# Patient Record
Sex: Male | Born: 1940
Health system: Southern US, Community
[De-identification: ages and names within clinical notes are randomized; demographics above are authoritative.]

## PROBLEM LIST (undated history)

## (undated) DIAGNOSIS — Z8719 Personal history of other diseases of the digestive system: Secondary | ICD-10-CM

## (undated) DIAGNOSIS — I6350 Cerebral infarction due to unspecified occlusion or stenosis of unspecified cerebral artery: Secondary | ICD-10-CM

## (undated) DIAGNOSIS — I2510 Atherosclerotic heart disease of native coronary artery without angina pectoris: Secondary | ICD-10-CM

## (undated) DIAGNOSIS — G8929 Other chronic pain: Secondary | ICD-10-CM

## (undated) DIAGNOSIS — I350 Nonrheumatic aortic (valve) stenosis: Principal | ICD-10-CM

## (undated) DIAGNOSIS — T8859XA Other complications of anesthesia, initial encounter: Secondary | ICD-10-CM

## (undated) DIAGNOSIS — F419 Anxiety disorder, unspecified: Secondary | ICD-10-CM

## (undated) DIAGNOSIS — N059 Unspecified nephritic syndrome with unspecified morphologic changes: Secondary | ICD-10-CM

## (undated) DIAGNOSIS — M549 Dorsalgia, unspecified: Secondary | ICD-10-CM

## (undated) DIAGNOSIS — C801 Malignant (primary) neoplasm, unspecified: Secondary | ICD-10-CM

## (undated) DIAGNOSIS — E785 Hyperlipidemia, unspecified: Secondary | ICD-10-CM

## (undated) DIAGNOSIS — E538 Deficiency of other specified B group vitamins: Secondary | ICD-10-CM

## (undated) DIAGNOSIS — R011 Cardiac murmur, unspecified: Secondary | ICD-10-CM

## (undated) DIAGNOSIS — M1990 Unspecified osteoarthritis, unspecified site: Secondary | ICD-10-CM

## (undated) DIAGNOSIS — Z889 Allergy status to unspecified drugs, medicaments and biological substances status: Secondary | ICD-10-CM

## (undated) DIAGNOSIS — I639 Cerebral infarction, unspecified: Secondary | ICD-10-CM

## (undated) DIAGNOSIS — I10 Essential (primary) hypertension: Secondary | ICD-10-CM

## (undated) DIAGNOSIS — I6529 Occlusion and stenosis of unspecified carotid artery: Secondary | ICD-10-CM

## (undated) DIAGNOSIS — K219 Gastro-esophageal reflux disease without esophagitis: Secondary | ICD-10-CM

## (undated) DIAGNOSIS — G4730 Sleep apnea, unspecified: Secondary | ICD-10-CM

## (undated) DIAGNOSIS — T4145XA Adverse effect of unspecified anesthetic, initial encounter: Secondary | ICD-10-CM

## (undated) HISTORY — DX: Hyperlipidemia, unspecified: E78.5

## (undated) HISTORY — PX: INGUINAL HERNIA REPAIR: SUR1180

## (undated) HISTORY — PX: PERCUTANEOUS PLACEMENT INTRAVASCULAR STENT CERVICAL CAROTID ARTERY: SUR1019

## (undated) HISTORY — DX: Unspecified nephritic syndrome with unspecified morphologic changes: N05.9

## (undated) HISTORY — DX: Gastro-esophageal reflux disease without esophagitis: K21.9

## (undated) HISTORY — DX: Nonrheumatic aortic (valve) stenosis: I35.0

## (undated) HISTORY — PX: UPPER GASTROINTESTINAL ENDOSCOPY: SHX188

## (undated) HISTORY — PX: HERNIA REPAIR: SHX51

## (undated) HISTORY — PX: COLONOSCOPY: SHX174

## (undated) HISTORY — DX: Atherosclerotic heart disease of native coronary artery without angina pectoris: I25.10

## (undated) HISTORY — DX: Sleep apnea, unspecified: G47.30

## (undated) HISTORY — DX: Deficiency of other specified B group vitamins: E53.8

## (undated) HISTORY — DX: Occlusion and stenosis of unspecified carotid artery: I65.29

## (undated) HISTORY — DX: Essential (primary) hypertension: I10

## (undated) HISTORY — DX: Cerebral infarction due to unspecified occlusion or stenosis of unspecified cerebral artery: I63.50

## (undated) HISTORY — DX: Malignant (primary) neoplasm, unspecified: C80.1

## (undated) HISTORY — DX: Cardiac murmur, unspecified: R01.1

---

## 1992-03-02 DIAGNOSIS — Z8711 Personal history of peptic ulcer disease: Secondary | ICD-10-CM

## 1992-03-02 HISTORY — DX: Personal history of peptic ulcer disease: Z87.11

## 1997-10-10 ENCOUNTER — Inpatient Hospital Stay (HOSPITAL_COMMUNITY): Admission: EM | Admit: 1997-10-10 | Discharge: 1997-10-11 | Payer: Self-pay | Admitting: Emergency Medicine

## 1997-12-07 ENCOUNTER — Ambulatory Visit (HOSPITAL_COMMUNITY): Admission: RE | Admit: 1997-12-07 | Discharge: 1997-12-07 | Payer: Self-pay | Admitting: Internal Medicine

## 1998-02-25 ENCOUNTER — Observation Stay (HOSPITAL_COMMUNITY): Admission: EM | Admit: 1998-02-25 | Discharge: 1998-02-26 | Payer: Self-pay | Admitting: Emergency Medicine

## 1998-02-25 ENCOUNTER — Encounter: Payer: Self-pay | Admitting: Emergency Medicine

## 1998-05-09 ENCOUNTER — Inpatient Hospital Stay (HOSPITAL_COMMUNITY): Admission: EM | Admit: 1998-05-09 | Discharge: 1998-05-12 | Payer: Self-pay | Admitting: Emergency Medicine

## 1998-06-27 ENCOUNTER — Emergency Department (HOSPITAL_COMMUNITY): Admission: EM | Admit: 1998-06-27 | Discharge: 1998-06-27 | Payer: Self-pay | Admitting: Emergency Medicine

## 1998-06-27 ENCOUNTER — Encounter: Payer: Self-pay | Admitting: Emergency Medicine

## 1998-07-30 ENCOUNTER — Inpatient Hospital Stay (HOSPITAL_COMMUNITY): Admission: EM | Admit: 1998-07-30 | Discharge: 1998-07-31 | Payer: Self-pay | Admitting: Emergency Medicine

## 1999-10-24 ENCOUNTER — Encounter: Payer: Self-pay | Admitting: General Surgery

## 1999-10-28 ENCOUNTER — Ambulatory Visit (HOSPITAL_COMMUNITY): Admission: RE | Admit: 1999-10-28 | Discharge: 1999-10-28 | Payer: Self-pay | Admitting: General Surgery

## 2001-04-08 ENCOUNTER — Encounter: Payer: Self-pay | Admitting: Internal Medicine

## 2001-04-08 ENCOUNTER — Encounter: Admission: RE | Admit: 2001-04-08 | Discharge: 2001-04-08 | Payer: Self-pay | Admitting: Internal Medicine

## 2001-04-10 ENCOUNTER — Encounter: Admission: RE | Admit: 2001-04-10 | Discharge: 2001-04-10 | Payer: Self-pay | Admitting: Internal Medicine

## 2001-04-10 ENCOUNTER — Encounter: Payer: Self-pay | Admitting: Internal Medicine

## 2002-08-08 ENCOUNTER — Encounter: Payer: Self-pay | Admitting: Internal Medicine

## 2002-08-08 ENCOUNTER — Ambulatory Visit (HOSPITAL_COMMUNITY): Admission: RE | Admit: 2002-08-08 | Discharge: 2002-08-08 | Payer: Self-pay | Admitting: Internal Medicine

## 2002-08-15 ENCOUNTER — Ambulatory Visit (HOSPITAL_COMMUNITY): Admission: RE | Admit: 2002-08-15 | Discharge: 2002-08-15 | Payer: Self-pay | Admitting: Internal Medicine

## 2002-09-12 ENCOUNTER — Ambulatory Visit (HOSPITAL_COMMUNITY): Admission: RE | Admit: 2002-09-12 | Discharge: 2002-09-12 | Payer: Self-pay | Admitting: Interventional Radiology

## 2003-01-11 ENCOUNTER — Ambulatory Visit (HOSPITAL_COMMUNITY): Admission: RE | Admit: 2003-01-11 | Discharge: 2003-01-11 | Payer: Self-pay | Admitting: Interventional Radiology

## 2003-01-16 ENCOUNTER — Ambulatory Visit (HOSPITAL_COMMUNITY): Admission: RE | Admit: 2003-01-16 | Discharge: 2003-01-16 | Payer: Self-pay | Admitting: Interventional Radiology

## 2004-04-30 ENCOUNTER — Ambulatory Visit: Payer: Self-pay | Admitting: Internal Medicine

## 2004-05-12 ENCOUNTER — Ambulatory Visit: Payer: Self-pay | Admitting: Internal Medicine

## 2004-05-12 ENCOUNTER — Observation Stay (HOSPITAL_COMMUNITY): Admission: EM | Admit: 2004-05-12 | Discharge: 2004-05-13 | Payer: Self-pay | Admitting: *Deleted

## 2004-05-14 ENCOUNTER — Ambulatory Visit: Payer: Self-pay

## 2004-05-14 ENCOUNTER — Ambulatory Visit: Payer: Self-pay | Admitting: Cardiology

## 2004-05-20 ENCOUNTER — Ambulatory Visit: Payer: Self-pay | Admitting: Internal Medicine

## 2004-06-23 ENCOUNTER — Ambulatory Visit: Payer: Self-pay | Admitting: Internal Medicine

## 2004-06-24 ENCOUNTER — Ambulatory Visit: Payer: Self-pay | Admitting: Internal Medicine

## 2004-08-25 ENCOUNTER — Ambulatory Visit: Payer: Self-pay | Admitting: Internal Medicine

## 2004-08-29 ENCOUNTER — Ambulatory Visit: Payer: Self-pay | Admitting: Internal Medicine

## 2004-09-22 ENCOUNTER — Ambulatory Visit: Payer: Self-pay | Admitting: Internal Medicine

## 2004-11-24 ENCOUNTER — Ambulatory Visit: Payer: Self-pay | Admitting: Internal Medicine

## 2004-12-01 ENCOUNTER — Ambulatory Visit: Payer: Self-pay | Admitting: Internal Medicine

## 2005-02-11 ENCOUNTER — Ambulatory Visit: Payer: Self-pay | Admitting: Internal Medicine

## 2005-03-02 HISTORY — PX: CARDIAC CATHETERIZATION: SHX172

## 2005-03-25 ENCOUNTER — Inpatient Hospital Stay (HOSPITAL_COMMUNITY): Admission: EM | Admit: 2005-03-25 | Discharge: 2005-03-26 | Payer: Self-pay | Admitting: Emergency Medicine

## 2005-03-25 ENCOUNTER — Ambulatory Visit: Payer: Self-pay | Admitting: Cardiovascular Disease

## 2005-04-07 ENCOUNTER — Encounter: Payer: Self-pay | Admitting: Cardiology

## 2005-04-07 ENCOUNTER — Ambulatory Visit: Payer: Self-pay

## 2005-04-20 ENCOUNTER — Ambulatory Visit: Payer: Self-pay | Admitting: Cardiology

## 2005-05-04 ENCOUNTER — Ambulatory Visit: Payer: Self-pay | Admitting: Cardiology

## 2005-06-02 ENCOUNTER — Ambulatory Visit: Payer: Self-pay | Admitting: Internal Medicine

## 2005-06-16 ENCOUNTER — Ambulatory Visit: Payer: Self-pay | Admitting: Internal Medicine

## 2005-08-24 ENCOUNTER — Ambulatory Visit: Payer: Self-pay | Admitting: Internal Medicine

## 2005-09-07 ENCOUNTER — Ambulatory Visit: Payer: Self-pay | Admitting: Internal Medicine

## 2005-09-07 LAB — HM COLONOSCOPY

## 2005-10-24 ENCOUNTER — Ambulatory Visit: Payer: Self-pay | Admitting: Family Medicine

## 2005-10-29 ENCOUNTER — Ambulatory Visit: Payer: Self-pay | Admitting: Internal Medicine

## 2005-11-27 ENCOUNTER — Ambulatory Visit: Payer: Self-pay | Admitting: Internal Medicine

## 2005-12-04 ENCOUNTER — Ambulatory Visit: Payer: Self-pay | Admitting: Internal Medicine

## 2005-12-11 ENCOUNTER — Ambulatory Visit: Payer: Self-pay | Admitting: Internal Medicine

## 2005-12-16 ENCOUNTER — Ambulatory Visit: Payer: Self-pay | Admitting: Internal Medicine

## 2006-01-16 ENCOUNTER — Ambulatory Visit: Payer: Self-pay | Admitting: Internal Medicine

## 2006-01-18 ENCOUNTER — Ambulatory Visit: Payer: Self-pay | Admitting: Internal Medicine

## 2006-02-10 ENCOUNTER — Ambulatory Visit: Payer: Self-pay | Admitting: Internal Medicine

## 2006-03-17 ENCOUNTER — Ambulatory Visit: Payer: Self-pay | Admitting: Internal Medicine

## 2006-03-19 ENCOUNTER — Ambulatory Visit: Payer: Self-pay | Admitting: Internal Medicine

## 2006-03-19 LAB — CONVERTED CEMR LAB
ALT: 37 units/L (ref 0–40)
AST: 25 units/L (ref 0–37)
Albumin: 4.1 g/dL (ref 3.5–5.2)
Alkaline Phosphatase: 84 units/L (ref 39–117)
Basophils Absolute: 0 10*3/uL (ref 0.0–0.1)
GFR calc Af Amer: 97 mL/min
Ketones, ur: NEGATIVE mg/dL
MCHC: 33.4 g/dL (ref 30.0–36.0)
MCV: 87.8 fL (ref 78.0–100.0)
Monocytes Relative: 10.8 % (ref 3.0–11.0)
Nitrite: NEGATIVE
Platelets: 260 10*3/uL (ref 150–400)
Potassium: 4.8 meq/L (ref 3.5–5.1)
RBC: 4.67 M/uL (ref 4.22–5.81)
Sodium: 141 meq/L (ref 135–145)
Total Bilirubin: 0.7 mg/dL (ref 0.3–1.2)
Urine Glucose: NEGATIVE mg/dL
WBC: 6.8 10*3/uL (ref 4.5–10.5)

## 2006-03-22 ENCOUNTER — Ambulatory Visit: Payer: Self-pay | Admitting: Cardiology

## 2006-03-22 ENCOUNTER — Ambulatory Visit: Payer: Self-pay

## 2006-04-01 ENCOUNTER — Ambulatory Visit: Payer: Self-pay | Admitting: Internal Medicine

## 2006-04-19 ENCOUNTER — Encounter: Admission: RE | Admit: 2006-04-19 | Discharge: 2006-04-19 | Payer: Self-pay | Admitting: Internal Medicine

## 2006-06-30 ENCOUNTER — Ambulatory Visit: Payer: Self-pay | Admitting: Internal Medicine

## 2006-07-03 ENCOUNTER — Encounter: Admission: RE | Admit: 2006-07-03 | Discharge: 2006-07-03 | Payer: Self-pay | Admitting: Neurology

## 2006-07-20 ENCOUNTER — Ambulatory Visit: Payer: Self-pay | Admitting: Internal Medicine

## 2006-08-17 ENCOUNTER — Encounter: Payer: Self-pay | Admitting: Internal Medicine

## 2006-08-17 ENCOUNTER — Inpatient Hospital Stay (HOSPITAL_COMMUNITY): Admission: EM | Admit: 2006-08-17 | Discharge: 2006-08-19 | Payer: Self-pay | Admitting: Emergency Medicine

## 2006-08-17 ENCOUNTER — Ambulatory Visit: Payer: Self-pay | Admitting: Internal Medicine

## 2006-08-17 ENCOUNTER — Ambulatory Visit: Payer: Self-pay | Admitting: Vascular Surgery

## 2006-09-01 ENCOUNTER — Ambulatory Visit: Payer: Self-pay | Admitting: Internal Medicine

## 2006-09-06 ENCOUNTER — Ambulatory Visit: Payer: Self-pay | Admitting: Internal Medicine

## 2006-12-11 ENCOUNTER — Encounter: Payer: Self-pay | Admitting: Internal Medicine

## 2006-12-11 DIAGNOSIS — N059 Unspecified nephritic syndrome with unspecified morphologic changes: Secondary | ICD-10-CM | POA: Insufficient documentation

## 2006-12-11 DIAGNOSIS — I6350 Cerebral infarction due to unspecified occlusion or stenosis of unspecified cerebral artery: Secondary | ICD-10-CM | POA: Insufficient documentation

## 2006-12-11 DIAGNOSIS — Z8719 Personal history of other diseases of the digestive system: Secondary | ICD-10-CM

## 2006-12-11 DIAGNOSIS — M722 Plantar fascial fibromatosis: Secondary | ICD-10-CM

## 2006-12-11 DIAGNOSIS — Z9189 Other specified personal risk factors, not elsewhere classified: Secondary | ICD-10-CM | POA: Insufficient documentation

## 2006-12-11 DIAGNOSIS — I10 Essential (primary) hypertension: Secondary | ICD-10-CM

## 2006-12-11 DIAGNOSIS — K219 Gastro-esophageal reflux disease without esophagitis: Secondary | ICD-10-CM | POA: Insufficient documentation

## 2006-12-11 DIAGNOSIS — B009 Herpesviral infection, unspecified: Secondary | ICD-10-CM | POA: Insufficient documentation

## 2006-12-11 HISTORY — DX: Gastro-esophageal reflux disease without esophagitis: K21.9

## 2006-12-11 HISTORY — DX: Cerebral infarction due to unspecified occlusion or stenosis of unspecified cerebral artery: I63.50

## 2006-12-11 HISTORY — DX: Unspecified nephritic syndrome with unspecified morphologic changes: N05.9

## 2006-12-11 HISTORY — DX: Essential (primary) hypertension: I10

## 2007-01-14 ENCOUNTER — Emergency Department (HOSPITAL_COMMUNITY): Admission: EM | Admit: 2007-01-14 | Discharge: 2007-01-14 | Payer: Self-pay | Admitting: *Deleted

## 2007-01-18 ENCOUNTER — Ambulatory Visit: Payer: Self-pay | Admitting: Internal Medicine

## 2007-01-18 DIAGNOSIS — M62838 Other muscle spasm: Secondary | ICD-10-CM

## 2007-02-14 ENCOUNTER — Telehealth: Payer: Self-pay | Admitting: Internal Medicine

## 2007-02-23 ENCOUNTER — Ambulatory Visit: Payer: Self-pay | Admitting: Internal Medicine

## 2007-02-23 ENCOUNTER — Telehealth: Payer: Self-pay | Admitting: Internal Medicine

## 2007-05-23 ENCOUNTER — Ambulatory Visit: Payer: Self-pay | Admitting: Internal Medicine

## 2007-07-15 ENCOUNTER — Ambulatory Visit: Payer: Self-pay | Admitting: Cardiology

## 2007-08-23 ENCOUNTER — Telehealth: Payer: Self-pay | Admitting: Internal Medicine

## 2007-09-06 ENCOUNTER — Ambulatory Visit: Payer: Self-pay

## 2007-09-06 ENCOUNTER — Ambulatory Visit: Payer: Self-pay | Admitting: Cardiology

## 2007-09-06 LAB — CONVERTED CEMR LAB
AST: 24 units/L (ref 0–37)
Albumin: 3.7 g/dL (ref 3.5–5.2)
HDL: 31.7 mg/dL — ABNORMAL LOW (ref >39.0)
LDL Cholesterol: 86 mg/dL (ref 0–99)
PSA: 0.66 ng/mL (ref 0.10–4.00)
Total CHOL/HDL Ratio: 4.7
Total Protein: 6.5 g/dL (ref 6.0–8.3)
Triglycerides: 156 mg/dL — ABNORMAL HIGH (ref 0–149)

## 2007-09-20 ENCOUNTER — Telehealth: Payer: Self-pay | Admitting: Internal Medicine

## 2007-09-28 ENCOUNTER — Telehealth (INDEPENDENT_AMBULATORY_CARE_PROVIDER_SITE_OTHER): Payer: Self-pay | Admitting: *Deleted

## 2007-12-05 ENCOUNTER — Ambulatory Visit: Payer: Self-pay | Admitting: Internal Medicine

## 2007-12-08 ENCOUNTER — Telehealth: Payer: Self-pay | Admitting: Internal Medicine

## 2007-12-09 ENCOUNTER — Ambulatory Visit: Payer: Self-pay | Admitting: Internal Medicine

## 2007-12-09 DIAGNOSIS — R109 Unspecified abdominal pain: Secondary | ICD-10-CM

## 2007-12-16 ENCOUNTER — Encounter: Admission: RE | Admit: 2007-12-16 | Discharge: 2007-12-16 | Payer: Self-pay | Admitting: Internal Medicine

## 2007-12-19 ENCOUNTER — Telehealth: Payer: Self-pay | Admitting: Internal Medicine

## 2008-05-08 ENCOUNTER — Telehealth: Payer: Self-pay | Admitting: Internal Medicine

## 2008-06-07 ENCOUNTER — Encounter (INDEPENDENT_AMBULATORY_CARE_PROVIDER_SITE_OTHER): Payer: Self-pay | Admitting: *Deleted

## 2008-06-07 ENCOUNTER — Ambulatory Visit: Payer: Self-pay | Admitting: Internal Medicine

## 2008-06-07 DIAGNOSIS — J00 Acute nasopharyngitis [common cold]: Secondary | ICD-10-CM | POA: Insufficient documentation

## 2008-06-11 ENCOUNTER — Telehealth: Payer: Self-pay | Admitting: Internal Medicine

## 2008-07-17 ENCOUNTER — Telehealth: Payer: Self-pay | Admitting: Internal Medicine

## 2008-07-27 ENCOUNTER — Ambulatory Visit: Payer: Self-pay | Admitting: Internal Medicine

## 2008-07-27 DIAGNOSIS — M546 Pain in thoracic spine: Secondary | ICD-10-CM

## 2008-08-02 ENCOUNTER — Ambulatory Visit: Payer: Self-pay | Admitting: Internal Medicine

## 2008-08-04 ENCOUNTER — Encounter: Admission: RE | Admit: 2008-08-04 | Discharge: 2008-08-04 | Payer: Self-pay | Admitting: Internal Medicine

## 2008-08-08 ENCOUNTER — Telehealth: Payer: Self-pay | Admitting: Internal Medicine

## 2008-08-18 ENCOUNTER — Encounter: Admission: RE | Admit: 2008-08-18 | Discharge: 2008-08-18 | Payer: Self-pay | Admitting: Internal Medicine

## 2008-08-23 ENCOUNTER — Telehealth: Payer: Self-pay | Admitting: Internal Medicine

## 2008-09-07 ENCOUNTER — Encounter: Payer: Self-pay | Admitting: Internal Medicine

## 2008-10-04 ENCOUNTER — Encounter: Payer: Self-pay | Admitting: Cardiovascular Disease

## 2008-10-04 ENCOUNTER — Ambulatory Visit: Payer: Self-pay

## 2008-11-02 ENCOUNTER — Telehealth: Payer: Self-pay | Admitting: Internal Medicine

## 2008-12-06 ENCOUNTER — Telehealth (INDEPENDENT_AMBULATORY_CARE_PROVIDER_SITE_OTHER): Payer: Self-pay | Admitting: *Deleted

## 2008-12-06 ENCOUNTER — Telehealth: Payer: Self-pay | Admitting: Internal Medicine

## 2008-12-18 ENCOUNTER — Telehealth: Payer: Self-pay | Admitting: Internal Medicine

## 2009-01-21 ENCOUNTER — Telehealth: Payer: Self-pay | Admitting: Internal Medicine

## 2009-02-21 ENCOUNTER — Telehealth (INDEPENDENT_AMBULATORY_CARE_PROVIDER_SITE_OTHER): Payer: Self-pay | Admitting: *Deleted

## 2009-02-27 ENCOUNTER — Ambulatory Visit: Payer: Self-pay | Admitting: Internal Medicine

## 2009-02-27 LAB — CONVERTED CEMR LAB
AST: 25 units/L (ref 0–37)
Albumin: 3.9 g/dL (ref 3.5–5.2)
Alkaline Phosphatase: 66 units/L (ref 39–117)
Bilirubin, Direct: 0.1 mg/dL (ref 0.0–0.3)
Total CHOL/HDL Ratio: 4
Triglycerides: 156 mg/dL — ABNORMAL HIGH (ref 0.0–149.0)

## 2009-03-10 ENCOUNTER — Encounter: Payer: Self-pay | Admitting: Internal Medicine

## 2009-06-12 ENCOUNTER — Telehealth: Payer: Self-pay | Admitting: Internal Medicine

## 2009-08-15 ENCOUNTER — Ambulatory Visit: Payer: Self-pay | Admitting: Internal Medicine

## 2009-09-06 ENCOUNTER — Telehealth: Payer: Self-pay | Admitting: Internal Medicine

## 2009-10-24 ENCOUNTER — Telehealth: Payer: Self-pay | Admitting: Internal Medicine

## 2009-10-25 ENCOUNTER — Ambulatory Visit: Payer: Self-pay | Admitting: Internal Medicine

## 2009-10-25 LAB — CONVERTED CEMR LAB: Total CK: 171 units/L (ref 7–232)

## 2009-11-04 ENCOUNTER — Encounter: Payer: Self-pay | Admitting: Internal Medicine

## 2009-11-06 ENCOUNTER — Telehealth: Payer: Self-pay | Admitting: Internal Medicine

## 2009-11-07 ENCOUNTER — Telehealth: Payer: Self-pay | Admitting: Internal Medicine

## 2009-11-26 ENCOUNTER — Encounter: Payer: Self-pay | Admitting: Cardiology

## 2009-11-26 DIAGNOSIS — I6529 Occlusion and stenosis of unspecified carotid artery: Secondary | ICD-10-CM

## 2009-11-26 HISTORY — DX: Occlusion and stenosis of unspecified carotid artery: I65.29

## 2009-11-27 ENCOUNTER — Encounter: Payer: Self-pay | Admitting: Cardiology

## 2009-11-27 ENCOUNTER — Ambulatory Visit: Payer: Self-pay

## 2009-12-27 DIAGNOSIS — E785 Hyperlipidemia, unspecified: Secondary | ICD-10-CM | POA: Insufficient documentation

## 2009-12-27 DIAGNOSIS — I739 Peripheral vascular disease, unspecified: Secondary | ICD-10-CM | POA: Insufficient documentation

## 2009-12-27 DIAGNOSIS — R569 Unspecified convulsions: Secondary | ICD-10-CM | POA: Insufficient documentation

## 2009-12-27 DIAGNOSIS — F419 Anxiety disorder, unspecified: Secondary | ICD-10-CM | POA: Insufficient documentation

## 2009-12-27 DIAGNOSIS — F329 Major depressive disorder, single episode, unspecified: Secondary | ICD-10-CM

## 2009-12-27 HISTORY — DX: Hyperlipidemia, unspecified: E78.5

## 2009-12-30 ENCOUNTER — Ambulatory Visit: Payer: Self-pay | Admitting: Cardiology

## 2009-12-30 DIAGNOSIS — R0602 Shortness of breath: Secondary | ICD-10-CM

## 2009-12-30 DIAGNOSIS — I2510 Atherosclerotic heart disease of native coronary artery without angina pectoris: Secondary | ICD-10-CM

## 2009-12-30 DIAGNOSIS — R011 Cardiac murmur, unspecified: Secondary | ICD-10-CM

## 2009-12-30 DIAGNOSIS — R0609 Other forms of dyspnea: Secondary | ICD-10-CM | POA: Insufficient documentation

## 2009-12-30 HISTORY — DX: Atherosclerotic heart disease of native coronary artery without angina pectoris: I25.10

## 2010-01-09 ENCOUNTER — Telehealth (INDEPENDENT_AMBULATORY_CARE_PROVIDER_SITE_OTHER): Payer: Self-pay | Admitting: *Deleted

## 2010-01-13 ENCOUNTER — Ambulatory Visit (HOSPITAL_COMMUNITY): Admission: RE | Admit: 2010-01-13 | Discharge: 2010-01-13 | Payer: Self-pay | Admitting: Cardiology

## 2010-01-13 ENCOUNTER — Ambulatory Visit: Payer: Self-pay | Admitting: Cardiovascular Disease

## 2010-01-13 ENCOUNTER — Encounter: Payer: Self-pay | Admitting: Cardiology

## 2010-01-13 ENCOUNTER — Ambulatory Visit: Payer: Self-pay

## 2010-01-13 ENCOUNTER — Encounter (HOSPITAL_COMMUNITY)
Admission: RE | Admit: 2010-01-13 | Discharge: 2010-03-01 | Payer: Self-pay | Source: Home / Self Care | Attending: Cardiology | Admitting: Cardiology

## 2010-01-13 ENCOUNTER — Ambulatory Visit: Payer: Self-pay | Admitting: Cardiology

## 2010-01-15 ENCOUNTER — Telehealth: Payer: Self-pay | Admitting: Cardiology

## 2010-02-10 ENCOUNTER — Ambulatory Visit: Payer: Self-pay | Admitting: Cardiology

## 2010-02-10 LAB — CONVERTED CEMR LAB
ALT: 29 units/L (ref 0–53)
AST: 23 units/L (ref 0–37)
Albumin: 4.3 g/dL (ref 3.5–5.2)
Cholesterol: 174 mg/dL (ref 0–200)
HDL: 33.1 mg/dL — ABNORMAL LOW (ref >39.00)
Total Bilirubin: 0.8 mg/dL (ref 0.3–1.2)
Total Protein: 7.1 g/dL (ref 6.0–8.3)
Triglycerides: 127 mg/dL (ref 0.0–149.0)
VLDL: 25.4 mg/dL (ref 0.0–40.0)

## 2010-03-14 ENCOUNTER — Telehealth: Payer: Self-pay | Admitting: Cardiology

## 2010-03-14 ENCOUNTER — Telehealth: Payer: Self-pay | Admitting: Internal Medicine

## 2010-03-22 ENCOUNTER — Encounter: Payer: Self-pay | Admitting: Interventional Radiology

## 2010-04-01 NOTE — Progress Notes (Signed)
Summary: Nuc Pre-Procedure  Phone Note Outgoing Call Call back at [facility] Phone [phone]   Call placed by: Antionette Char RN,  January 09, 2010 2:53 PM Call placed to: Patient Reason for Call: Confirm/change Appt Summary of Call: Reviewed information on Myoview Information Sheet (see scanned document for further details).  Spoke with patient's wife.     Nuclear Med Background Indications for Stress Test: Evaluation for Ischemia   History: Heart Catheterization  History Comments: 1/07 Cath- nonobstructive CAD  Symptoms: Chest Pain, DOE, SOB    Nuclear Pre-Procedure Cardiac Risk Factors: Carotid Disease, CVA, Family History - CAD, Hypertension, Lipids, PVD Height (in): 69

## 2010-04-01 NOTE — Progress Notes (Signed)
Summary: RESULTS  Phone Note Call from Patient   Summary of Call: Patient is requesting results of labs. Will call pt to inform - letter was completed Monday.  Initial call taken by: Lamar Sprinkles, CMA,  November 06, 2009 10:45 AM  Follow-up for Phone Call        Pt informed, continues to have cramps, advised f/u office visit for eval. He will call back.  Follow-up by: Lamar Sprinkles, CMA,  November 06, 2009 5:10 PM

## 2010-04-01 NOTE — Letter (Signed)
   [facility] [address] [place], [place]  [address] Phone: [phone]      March 11, 2009   Andrew Malone [address] [place], [place] [address]  RE:  LAB RESULTS  Dear  Andrew Malone,  The following is an interpretation of your most recent lab tests.  Please take note of any instructions provided or changes to medications that have resulted from your lab work.  LIVER FUNCTION TESTS:  Good - no changes needed  Health professionals look at cholesterol as more involved than just the total cholesterol. We consider the level of LDL (bad) cholesterol, HDL (good), cholesterol, and Triglycerides (Grease) in the blood.  1. Your LDL should be under 100, and the HDL should be over 45, if you have any vascular disease such as heart attack, angina, stroke, TIA (mini stroke), claudication (pain in the legs when you walk due to poor circulation),  Abdominal Aortic Aneurysm (AAA), diabetes or prediabetes.  2. Your LDL should be under 130 if you have any two of the following:     a. Smoke or chew tobacco,     b. High blood pressure (if you are on medication or over 140/90 without medication),     c. Male gender,    d. HDL below 40,    e. A male relative (father, brother, or son), who have had any vascular event          as described in #1. above under the age of 44, or a male relative (mother,       sister, or daughter) who had an event as described above under age 96. (An HDL over 60 will subtract one risk factor from the total, so if you have two items in # 2 above, but an HDL over 60, you then fall into category # 3 below).  3. Your LDL should be under 160 if you have any one of the above.  Triglycerides should be under 200 with the ideal being under 150.  For diabetes or pre-diabetes, the ideal HgbA1C should be under 6.0%.  If you fall into any of the above categories, you should make a follow up appointment to discuss this with your physician.  LIPID PANEL:   Good - no changes needed Triglyceride: 156.0   Cholesterol: 148   LDL: 82   HDL: 35.10   Chol/HDL%:  4   Lipid panel looks great.  Call or e-mail me if you have questions ([email or web address]).   Sincerely Yours,    Jacques Navy MD

## 2010-04-01 NOTE — Letter (Signed)
   [facility] [address] [place], [place]  [address] Phone: [phone]      November 05, 2009   Andrew Malone [address] [place], [place] [address]  RE:  LAB RESULTS  Dear  Andrew Malone,  The following is an interpretation of your most recent lab tests.  Please take note of any instructions provided or changes to medications that have resulted from your lab work.  ELECTROLYTES:  Good - no changes needed   magnesium and total creatinine kinase were normal  No metabolic explanation for cramps.  Please come see me if you have any questions about these lab results.   Sincerely Yours,    Jacques Navy MD  Patient: Andrew Malone Note: All result statuses are Final unless otherwise noted.  Tests: (1) Hemoglobin A1C (A1C)   Hemoglobin A1C       [H]  6.8 %                       4.6-6.5     Glycemic Control Guidelines for People with Diabetes:     Non Diabetic:  <6%     Goal of Therapy: <7%     Additional Action Suggested:  >8%   Tests: (2) Potassium (K)   Potassium                 4.5 mEq/L                   3.5-5.1  Tests: (3) Magnesium (MG)   Magnesium                 1.9 mg/dL                   3.0-8.6  Tests: (4) Creatine Kinase (CK)   Creatine Kinase           171 U/L                     7-232

## 2010-04-01 NOTE — Assessment & Plan Note (Signed)
Summary: PER CHECK OUT/SF  Medications Added PRAVASTATIN SODIUM 80 MG TABS (PRAVASTATIN SODIUM) 1 once daily        Primary Provider:  Norins  CC:  sob.  History of Present Illness: Andrew Malone is a very pleasant gentleman who has a history of nonobstructive coronary disease as well as cerebrovascular disease.  Catheterization in January 2007 showed nonobstructive coronary disease, normal LV function with ejection fraction of 60%.  The patient also has cerebrovascular disease.  Carotid Dopplers in September of 2011 showed a 60-79% right and 0-39% left carotid stenosis. Followup was recommended in one year.Since I last saw him in May 2009, the patient has dyspnea with more extreme activities but not with routine activities. It is relieved with rest. It is not associated with chest pain. There is no orthopnea, PND or pedal edema. There is no syncope or palpitations. There is no exertional chest pain.   Preventive Screening-Counseling & Management  Alcohol-Tobacco     Smoking Status: never  Current Medications (verified): 1)  Simvastatin 80 Mg Tabs (Simvastatin) .... Take 1 Tab By Mouth At Bedtime 2)  Prilosec 20 Mg  Cpdr (Omeprazole) .... Two Times A Day 3)  Clonazepam 0.5 Mg  Tabs (Clonazepam) .... Take 1 Tab By Mouth and Q8 Prn 4)  Hydrochlorothiazide 25 Mg Tabs (Hydrochlorothiazide) .Marland Kitchen.. 1 By Mouth Once Daily 5)  Aspirin Adult Low Strength 81 Mg Tbec (Aspirin) .... Take 1 By Mouth Qd 6)  Vicodin Es 7.5-750 Mg Tabs (Hydrocodone-Acetaminophen) .Marland Kitchen.. 1 Q 6 Hrs As Needed Pain 7)  Acyclovir 200 Mg Caps (Acyclovir) .Marland Kitchen.. 1 Three Times A Day As Needed  Allergies: No Known Drug Allergies  Past History:  Past Medical History: HYPERLIPIDEMIA  CAROTID ARTERY DISEASE CVA  HYPERTENSION  SEIZURE DISORDER DEPRESSION  ANXIETY HIATAL HERNIA, HX OF  GLOMERULONEPHRITIS  PLANTAR FASCIITIS GERD   Past Surgical History: Reviewed history from 01/18/2007 and no changes required. Inguinal  herniorrhaphy  Social History: married 1969 1 son work: general maintenance at [employer] Tobacco Use - No.   Review of Systems       no fevers or chills, productive cough, hemoptysis, dysphasia, odynophagia, melena, hematochezia, dysuria, hematuria, rash, seizure activity, orthopnea, PND, pedal edema, claudication. Remaining systems are negative.   Vital Signs:  Patient profile:   70 year old male Height:      69 inches Weight:      189 pounds BMI:     28.01 Pulse rate:   70 / minute Resp:     14 per minute BP sitting:   128 / 70  (left arm)  Vitals Entered By: Kem Parkinson (December 30, 2009 9:11 AM)  Physical Exam  General:  Well-developed well-nourished in no acute distress.  Skin is warm and dry.  HEENT is normal.  Neck is supple. No thyromegaly.  Chest is clear to auscultation with normal expansion.  Cardiovascular exam is regular rate and rhythm. 2/6 systolic murmur right sternal border. S2 is not diminished. Abdominal exam nontender or distended. No masses palpated. Extremities show no edema. neuro grossly intact    EKG  Procedure date:  12/30/2009  Findings:      Sinus rhythm at a rate of 70. Occasional PVC. No ST changes.  Impression & Recommendations:  Problem # 1:  HYPERLIPIDEMIA (ICD-272.4) Given recent FDA warning discontinue Zocor and begin Pravachol 80 mg p.o. daily. Check lipids and liver 6 weeks later. His updated medication list for this problem includes:    Pravastatin Sodium 80 Mg  Tabs (Pravastatin sodium) .Marland Kitchen... 1 once daily  Problem # 2:  CAROTID ARTERY DISEASE (ICD-433.10)  Continue aspirin and statin. Followup carotid Dopplers September 2012. His updated medication list for this problem includes:    Aspirin Adult Low Strength 81 Mg Tbec (Aspirin) .Marland Kitchen... Take 1 by mouth qd  His updated medication list for this problem includes:    Aspirin Adult Low Strength 81 Mg Tbec (Aspirin) .Marland Kitchen... Take 1 by mouth qd  Orders: Carotid Duplex  (Carotid Duplex)  Problem # 3:  HYPERTENSION (ICD-401.9)  Blood pressure controlled. His updated medication list for this problem includes:    Hydrochlorothiazide 25 Mg Tabs (Hydrochlorothiazide) .Marland Kitchen... 1 by mouth once daily    Aspirin Adult Low Strength 81 Mg Tbec (Aspirin) .Marland Kitchen... Take 1 by mouth qd  Orders: EKG w/ Interpretation (93000)  His updated medication list for this problem includes:    Hydrochlorothiazide 25 Mg Tabs (Hydrochlorothiazide) .Marland Kitchen... 1 by mouth once daily    Aspirin Adult Low Strength 81 Mg Tbec (Aspirin) .Marland Kitchen... Take 1 by mouth qd  Problem # 4:  GERD (ICD-530.81)  His updated medication list for this problem includes:    Prilosec 20 Mg Cpdr (Omeprazole) .Marland Kitchen..Marland Kitchen Two times a day  His updated medication list for this problem includes:    Prilosec 20 Mg Cpdr (Omeprazole) .Marland Kitchen..Marland Kitchen Two times a day  Problem # 5:  CAD (ICD-414.00)  Continue aspirin and statin. His updated medication list for this problem includes:    Aspirin Adult Low Strength 81 Mg Tbec (Aspirin) .Marland Kitchen... Take 1 by mouth qd  His updated medication list for this problem includes:    Aspirin Adult Low Strength 81 Mg Tbec (Aspirin) .Marland Kitchen... Take 1 by mouth qd  Orders: Nuclear Stress Test (Nuc Stress Test)  Problem # 6:  MURMUR (ICD-785.2)  Probable ejection murmur. Schedule echo. His updated medication list for this problem includes:    Hydrochlorothiazide 25 Mg Tabs (Hydrochlorothiazide) .Marland Kitchen... 1 by mouth once daily  Orders: Echocardiogram (Echo)  Problem # 7:  DYSPNEA (ICD-786.05)  Pt complains of increased dyspnea on exertion. History of moderate coronary disease. Schedule myoview. His updated medication list for this problem includes:    Hydrochlorothiazide 25 Mg Tabs (Hydrochlorothiazide) .Marland Kitchen... 1 by mouth once daily    Aspirin Adult Low Strength 81 Mg Tbec (Aspirin) .Marland Kitchen... Take 1 by mouth qd  Orders: Nuclear Stress Test (Nuc Stress Test)  Patient Instructions: 1)  Your physician recommends that  you schedule a follow-up appointment in: SEPT 2012 SAME DAY AS CAROTID 2)  Your physician recommends that you return for lab work in: 6 WEEKS FASTING LIPID LIVER 272.4 V58.69 3)  Your physician has recommended you make the following change in your medication: STOP SIMVASTATIN 4)  START PRAVASTATIN 80 MG 1 once daily  5)  Your physician has requested that you have an echocardiogram.  Echocardiography is a painless test that uses sound waves to create images of your heart. It provides your doctor with information about the size and shape of your heart and how well your heart's chambers and valves are working.  This procedure takes approximately one hour. There are no restrictions for this procedure. 6)  Your physician has requested that you have an exercise stress myoview.  For further information please visit [email or web address]  Please follow instruction sheet, as given 7)  . 8)  Your physician has requested that you have a carotid duplex. This test is an ultrasound of the carotid arteries in your neck. It looks at blood  flow through these arteries that supply the brain with blood. Allow one hour for this exam. There are no restrictions or special instructions.DUE SEPT 2012  Prescriptions: PRAVASTATIN SODIUM 80 MG TABS (PRAVASTATIN SODIUM) 1 once daily  #30 x 11   Entered by:   Scherrie Bateman, LPN   Authorized by:   Ferman Hamming, MD, [facility]   Signed by:   Scherrie Bateman, LPN on 16/12/9602   Method used:   Electronically to        [facility] (retail)       [address]       [place], [place]  [address]       Ph: [phone]       Fax: [phone]   RxID:   [id number]

## 2010-04-01 NOTE — Assessment & Plan Note (Signed)
Summary: esophagus problem/cd   Vital Signs:  Patient profile:   70 year old male Height:      69 inches Weight:      201 pounds BMI:     29.79 O2 Sat:      95 % on Room air Temp:     98.1 degrees F oral Pulse rate:   75 / minute BP sitting:   128 / 76  (left arm) Cuff size:   large  Vitals Entered By: Bill Salinas CMA (August 15, 2009 2:09 PM)  O2 Flow:  Room air CC: pt here with c/o trouble sleeping with vomitting almost daily x 2 weeks/  he also has c/o fatigue/ ab   Primary Care Provider:  Katerra Ingman  CC:  pt here with c/o trouble sleeping with vomitting almost daily x 2 weeks/  he also has c/o fatigue/ ab.  History of Present Illness: Has not been feeling well: chest pain - sharp pain that is very transient, several times a day. No diaphoresis, not related to anxiety. He does take prilosec and occasionally mylanta. No hot water brash. No pain with swallow. He has been having nausea and several episodes of emesis. Emesis looks like food.  He will have occasional feelng of being flushed but he will have pallor. every 2nd to 3rd day. No sense of tachycardia,  no sensation of fright/flight  Sleep disorder: duration problems. He does not feel like he has to pee but will pee. Awakens every 2-3 hours. Once awake he often cannot return to sleep.  Not a loud snorer, no breath holding. Mostly caffein free beverages.  He is very draggy and sleepy.     Current Medications (verified): 1)  Simvastatin 80 Mg Tabs (Simvastatin) .... Take 1 Tab By Mouth At Bedtime 2)  Prilosec 20 Mg  Cpdr (Omeprazole) .... Two Times A Day 3)  Clonazepam 0.5 Mg  Tabs (Clonazepam) .... Take 1 Tab By Mouth and Q8 Prn 4)  Hydrochlorothiazide 25 Mg Tabs (Hydrochlorothiazide) .Marland Kitchen.. 1 By Mouth Once Daily 5)  Aspirin Adult Low Strength 81 Mg Tbec (Aspirin) .... Take 1 By Mouth Qd 6)  Cosamin Ds 500-400 Mg Tabs (Glucosamine-Chondroitin) .... Take 1 Tablet By Mouth Once A Day 7)  Vicodin Es 7.5-750 Mg Tabs  (Hydrocodone-Acetaminophen) .Marland Kitchen.. 1 Q 6 Hrs As Needed Pain 8)  Acyclovir 200 Mg Caps (Acyclovir) .Marland Kitchen.. 1 Three Times A Day As Needed  Allergies (verified): No Known Drug Allergies  Past History:  Past Medical History: Last updated: 01/18/2007 HERPES LABIALIS (ICD-054.9) GLOMERULONEPHRITIS (ICD-583.9) PLANTAR FASCIITIS (ICD-728.71) CVA (ICD-434.91) HYPERTENSION (ICD-401.9) GERD (ICD-530.81) CHEST PAIN, ATYPICAL, HX OF (ICD-V15.89) HIATAL HERNIA, HX OF (ICD-V12.79) Chronic low back and abdominal pain.  Past Surgical History: Last updated: 01/18/2007 Inguinal herniorrhaphy  Family History: Last updated: 01/18/2007 mother - CAD/MI, DM, HTN, Lipids 2 sisters, HTN, Lipids,-DM 1 brother- severe DM, Emphysema Negative for colon, prostate cancer  Social History: Last updated: 01/18/2007 married 1969 1 son work: general maintenance at [employer]  Review of Systems       The patient complains of chest pain and peripheral edema.  The patient denies anorexia, fever, weight loss, weight gain, decreased hearing, syncope, dyspnea on exertion, abdominal pain, severe indigestion/heartburn, incontinence, suspicious skin lesions, transient blindness, unusual weight change, and angioedema.         Has awakened sweaty on occasion. Mild pedal edema  Physical Exam  General:  WNWD white male in no distress Head:  normocephalic and atraumatic.   Eyes:  corneas and lenses clear and no injection.   Neck:  supple.   Lungs:  normal respiratory effort and normal breath sounds.   Heart:  normal rate and regular rhythm.   Abdomen:  soft, normal bowel sounds, no guarding, no rigidity, and no abdominal hernia.  Tender to palpation in the epigastrum and left upper quadrant. Msk:  normal ROM.   Pulses:  2+ radial Neurologic:  alert & oriented X3, cranial nerves II-XII intact, and gait normal.   Skin:  turgor normal and color normal.   Psych:  Oriented X3, normally interactive, and good eye  contact.     Impression & Recommendations:  Problem # 1:  GERD (ICD-530.81) Symptoms are suggestive of uncontrolled dypspepsia.  Plan - trial of dexilant 60mg  once daily. (10 days samples)  His updated medication list for this problem includes:    Prilosec 20 Mg Cpdr (Omeprazole) .Marland Kitchen..Marland Kitchen Two times a day  Complete Medication List: 1)  Simvastatin 80 Mg Tabs (Simvastatin) .... Take 1 tab by mouth at bedtime 2)  Prilosec 20 Mg Cpdr (Omeprazole) .... Two times a day 3)  Clonazepam 0.5 Mg Tabs (Clonazepam) .... Take 1 tab by mouth and q8 prn 4)  Hydrochlorothiazide 25 Mg Tabs (Hydrochlorothiazide) .Marland Kitchen.. 1 by mouth once daily 5)  Aspirin Adult Low Strength 81 Mg Tbec (Aspirin) .... Take 1 by mouth qd 6)  Cosamin Ds 500-400 Mg Tabs (Glucosamine-chondroitin) .... Take 1 tablet by mouth once a day 7)  Vicodin Es 7.5-750 Mg Tabs (Hydrocodone-acetaminophen) .Marland Kitchen.. 1 q 6 hrs as needed pain 8)  Acyclovir 200 Mg Caps (Acyclovir) .Marland Kitchen.. 1 three times a day as needed

## 2010-04-01 NOTE — Progress Notes (Signed)
Summary: Pt returning call   Phone Note Call from Patient Call back at Home Phone [phone]   Caller: Wyline Copas Summary of Call: Pt returning call Initial call taken by: Judie Grieve,  January 15, 2010 1:51 PM  Follow-up for Phone Call        Phone Call Completed PT'S WIFE AWARE OF ECHO RESULTS. Follow-up by: Scherrie Bateman, LPN,  January 15, 2010 2:21 PM

## 2010-04-01 NOTE — Progress Notes (Signed)
  Phone Note Refill Request Message from:  Fax from Pharmacy on November 07, 2009 10:39 AM  Refills Requested: Medication #1:  VICODIN ES 7.5-750 MG TABS 1 q 6 hrs as needed pain   Last Refilled: 08/2009 Please Advise refill for pt  Initial call taken by: Ami Bullins CMA,  November 07, 2009 10:39 AM  Follow-up for Phone Call        ok for refill x 3 Follow-up by: Jacques Navy MD,  November 07, 2009 1:03 PM    Prescriptions: VICODIN ES 7.5-750 MG TABS (HYDROCODONE-ACETAMINOPHEN) 1 q 6 hrs as needed pain  #60 x 3   Entered by:   Bill Salinas CMA   Authorized by:   Jacques Navy MD   Signed by:   Bill Salinas CMA on 11/07/2009   Method used:   Telephoned to ...       [facility]ail[facility]       [address]       [place], [place]  [address]       Ph: [phone]       Fax: [phone]   RxID:   [id number]

## 2010-04-01 NOTE — Miscellaneous (Signed)
Summary: Orders Update  Clinical Lists Changes  Problems: Added new problem of CAROTID ARTERY DISEASE (ICD-433.10) Orders: Added new Test order of Carotid Duplex (Carotid Duplex) - Signed 

## 2010-04-01 NOTE — Progress Notes (Signed)
Summary: Refill-Norins pt  Phone Note Refill Request Message from:  Fax from Pharmacy on September 06, 2009 11:41 AM  Refills Requested: Medication #1:  VICODIN ES 7.5-750 MG TABS 1 q 6 hrs as needed pain Next Appointment Scheduled: none Initial call taken by: Lucious Groves,  September 06, 2009 11:41 AM    New/Updated Medications: VICODIN ES 7.5-750 MG TABS (HYDROCODONE-ACETAMINOPHEN) 1 q 6 hrs as needed pain Prescriptions: VICODIN ES 7.5-750 MG TABS (HYDROCODONE-ACETAMINOPHEN) 1 q 6 hrs as needed pain  #60 x 0   Entered and Authorized by:   Corwin Levins MD   Signed by:   Corwin Levins MD on 09/06/2009   Method used:   Print then Give to Patient   RxID:   [id number]  done hardcopy to LIM side B - dahlia  Corwin Levins MD  September 06, 2009 2:52 PM   Rx faxed to pharmacy Margaret Pyle, CMA  September 06, 2009 3:10 PM   Appended Document: Refill-Norins pt Pt informed

## 2010-04-01 NOTE — Progress Notes (Signed)
  Phone Note Refill Request Message from:  Pharmacy on June 12, 2009 9:09 AM  Refills Requested: Medication #1:  CLONAZEPAM 0.5 MG  TABS Take 1 tab by mouth and q8 prn   Last Refilled: 04/03/2009  Medication #2:  VICODIN ES 7.5-750 MG TABS 1 q 6 hrs as needed pain   Last Refilled: 05/02/2009 please advise refill  Initial call taken by: Ami Bullins CMA,  June 12, 2009 9:09 AM  Follow-up for Phone Call        ok for refills  Follow-up by: Jacques Navy MD,  June 12, 2009 2:42 PM    Prescriptions: HYDROCHLOROTHIAZIDE 25 MG TABS (HYDROCHLOROTHIAZIDE) 1 by mouth once daily  #90 x 4   Entered by:   Rock Nephew CMA   Authorized by:   Jacques Navy MD   Signed by:   Rock Nephew CMA on 06/12/2009   Method used:   Telephoned to ...       [facility] (retail)       [address]       [place], [place]  [address]       Ph: [phone]       Fax: [phone]   RxID:   [id number] CLONAZEPAM 0.5 MG  TABS (CLONAZEPAM) Take 1 tab by mouth and q8 prn  #90 x 5   Entered by:   Rock Nephew CMA   Authorized by:   Jacques Navy MD   Signed by:   Rock Nephew CMA on 06/12/2009   Method used:   Telephoned to ...       [facility] (retail)       [address]       [place], [place]  [address]       Ph: [phone]       Fax: [phone]   RxID:   [id number]

## 2010-04-01 NOTE — Assessment & Plan Note (Signed)
Summary: Cardiology Nuclear Testing  Nuclear Med Background Indications for Stress Test: Evaluation for Ischemia   History: Heart Catheterization  History Comments: 1/07 Cath- nonobstructive CAD  Symptoms: Chest Pain, DOE, Palpitations, SOB    Nuclear Pre-Procedure Cardiac Risk Factors: Carotid Disease, CVA, Family History - CAD, Hypertension, Lipids, PVD Caffeine/Decaff Intake: none NPO After: 7:30 PM Lungs: clear IV 0.9% NS with Angio Cath: 22g     IV Site: R Hand IV Started by: Cathlyn Parsons, RN Chest Size (in) 42     Height (in): 69 Weight (lb): 193 BMI: 28.60  Nuclear Med Study 1 or 2 day study:  1 day     Stress Test Type:  Stress Reading MD:  Marca Ancona, MD     Referring MD:  B.Crenshaw Resting Radionuclide:  Technetium 53m Tetrofosmin     Resting Radionuclide Dose:  11 mCi  Stress Radionuclide:  Technetium 78m Tetrofosmin     Stress Radionuclide Dose:  33 mCi   Stress Protocol Exercise Time (min):  9:00 min     Max HR:  148 bpm     Predicted Max HR:  153 bpm  Max Systolic BP: 205 mm Hg     Percent Max HR:  96.73 %     METS: 10.10 Rate Pressure Product:  29562    Stress Test Technologist:  Milana Na, EMT-P     Nuclear Technologist:  Domenic Polite, CNMT  Rest Procedure  Myocardial perfusion imaging was performed at rest 45 minutes following the intravenous administration of Technetium 38m Tetrofosmin.  Stress Procedure  The patient exercised for 9:00. The patient stopped due to fatigue, sob, and denied any chest pain.  There were no significant ST-T wave changes and rare pvcs.  Technetium 26m Tetrofosmin was injected at peak exercise and myocardial perfusion imaging was performed after a brief delay.  QPS Raw Data Images:  Normal; no motion artifact; normal heart/lung ratio. Stress Images:  Normal homogeneous uptake in all areas of the myocardium. Rest Images:  Normal homogeneous uptake in all areas of the myocardium. Subtraction (SDS):  There  is no evidence of scar or ischemia. Transient Ischemic Dilatation:  1.0  (Normal <1.22)  Lung/Heart Ratio:  .30  (Normal <0.45)  Quantitative Gated Spect Images QGS EDV:  84 ml QGS ESV:  25 ml QGS EF:  70 % QGS cine images:  Normal wall motion.    Overall Impression  Exercise Capacity: Good exercise capacity. BP Response: Hypertensive blood pressure response. Clinical Symptoms: Short of breath, no chest pain.  ECG Impression: No significant ST segment change suggestive of ischemia. Overall Impression: Normal stress nuclear study.  Appended Document: Cardiology Nuclear Testing ok  Appended Document: Cardiology Nuclear Testing PT'S WIFE AWARE./CY

## 2010-04-01 NOTE — Progress Notes (Signed)
Summary: LAB? OV?  Phone Note Call from Patient Call back at [facility] Phone [phone]   Summary of Call: Pt c/o continued muscle cramps and have become worse. Pt is due for A1c, would MD like to check any further labs? Also, pharmacist advised pt that simvastatin can cause muscle cramping. Please advise, would you like office visit for eval?  Initial call taken by: Lamar Sprinkles, CMA,  October 24, 2009 9:49 AM  Follow-up for Phone Call        1) stop simvastatin - see if  cramps get better.  Will delay starting substute medication until we see if it was the statin causing the problem 2) additional lab: K, Mg, total CK 728.85 Follow-up by: Jacques Navy MD,  October 24, 2009 10:48 AM  Additional Follow-up for Phone Call Additional follow up Details #1::        Pt's wife informed.  Additional Follow-up by: Lamar Sprinkles, CMA,  October 24, 2009 5:32 PM    New/Updated Medications: SIMVASTATIN 80 MG TABS (SIMVASTATIN) Take 1 tab by mouth at bedtime -HOLD

## 2010-04-03 NOTE — Progress Notes (Signed)
Summary: [facility] about lipitor   Phone Note Call from Patient Call back at Home Phone [phone]   Caller: Wyline Copas Summary of Call: Calling about changing medication to Lipitor. Initial call taken by: Judie Grieve,  March 14, 2010 12:54 PM  Follow-up for Phone Call        pt spouse states that their insurance company has not put generic lipitor in a lower tier yet so they have to pay full price. She is wondering if pt can stay on  Pravastatin for the next 6 months without any risk to his health.  After that time, insurance will have adjusted their rates.  If there is a risk to his health, then they will go with the Lipitor.  Please advise.  Follow-up by: Claris Gladden RN,  March 14, 2010 1:19 PM  Additional Follow-up for Phone Call Additional follow up Details #1::        ok to continue pravachol and change to lipitor later; check lipids and liver 6 weeks after dcing pravachol and beginning lipitor Ferman Hamming, MD, [facility]  March 14, 2010 4:12 PM  pt spouse aware Deliah Goody, RN  March 18, 2010 4:56 PM

## 2010-04-03 NOTE — Progress Notes (Signed)
Summary: RF  Phone Note Refill Request Message from:  Pharmacy  Refills Requested: Medication #1:  CLONAZEPAM 0.5 MG  TABS Take 1 tab by mouth and q8 prn [facility] [address]  Initial call taken by: Lamar Sprinkles, CMA,  March 14, 2010 5:33 PM  Follow-up for Phone Call        ok for refill prn Follow-up by: Jacques Navy MD,  March 17, 2010 8:25 AM    Prescriptions: CLONAZEPAM 0.5 MG  TABS (CLONAZEPAM) Take 1 tab by mouth and q8 prn  #90 x 5   Entered by:   Lamar Sprinkles, CMA   Authorized by:   Jacques Navy MD   Signed by:   Lamar Sprinkles, CMA on 03/17/2010   Method used:   Telephoned to ...       [facility] (retail)       [address]       [place], [place]  [address]       Ph: [phone]       Fax: [phone]   RxID:   [id number]

## 2010-04-28 ENCOUNTER — Ambulatory Visit: Payer: Self-pay | Admitting: Internal Medicine

## 2010-04-28 ENCOUNTER — Encounter: Payer: Self-pay | Admitting: Internal Medicine

## 2010-04-28 ENCOUNTER — Other Ambulatory Visit: Payer: Medicare Other

## 2010-04-28 ENCOUNTER — Telehealth: Payer: Self-pay | Admitting: Internal Medicine

## 2010-04-28 ENCOUNTER — Other Ambulatory Visit: Payer: Self-pay | Admitting: Internal Medicine

## 2010-04-28 ENCOUNTER — Ambulatory Visit (INDEPENDENT_AMBULATORY_CARE_PROVIDER_SITE_OTHER): Payer: Medicare Other | Admitting: Internal Medicine

## 2010-04-28 DIAGNOSIS — R197 Diarrhea, unspecified: Secondary | ICD-10-CM

## 2010-04-28 LAB — BASIC METABOLIC PANEL
Calcium: 9.1 mg/dL (ref 8.4–10.5)
GFR: 65.33 mL/min (ref >60.00)
Potassium: 4.1 mEq/L (ref 3.5–5.1)
Sodium: 139 mEq/L (ref 135–145)

## 2010-04-28 LAB — CBC WITH DIFFERENTIAL/PLATELET
Basophils Absolute: 0 10*3/uL (ref 0.0–0.1)
Eosinophils Relative: 5.6 % — ABNORMAL HIGH (ref 0.0–5.0)
HCT: 40.8 % (ref 39.0–52.0)
Hemoglobin: 14.1 g/dL (ref 13.0–17.0)
Lymphocytes Relative: 29.4 % (ref 12.0–46.0)
Lymphs Abs: 1.8 10*3/uL (ref 0.7–4.0)
Monocytes Relative: 16.6 % — ABNORMAL HIGH (ref 3.0–12.0)
Neutro Abs: 3 10*3/uL (ref 1.4–7.7)
RBC: 4.7 Mil/uL (ref 4.22–5.81)
WBC: 6.3 10*3/uL (ref 4.5–10.5)

## 2010-04-29 ENCOUNTER — Telehealth: Payer: Self-pay | Admitting: Internal Medicine

## 2010-05-05 ENCOUNTER — Encounter: Payer: Self-pay | Admitting: Internal Medicine

## 2010-05-08 NOTE — Progress Notes (Signed)
Summary: UPDATE  Phone Note Outgoing Call   Reason for Call: Discuss lab or test results Summary of Call: please call patient: labs were normal: CBC, Bmet Hope he is doing better.  thanks Initial call taken by: Jacques Navy MD,  April 29, 2010 11:26 AM  Follow-up for Phone Call        Pt informed, he is feeling "right much" better. Advised pt to call office back if he did not continue to feel better.  Follow-up by: Lamar Sprinkles, CMA,  April 29, 2010 5:14 PM

## 2010-05-08 NOTE — Progress Notes (Signed)
Summary: Call Report  Phone Note Other Incoming   Caller: [facility] Summary of Call: [facility] Triage Call Report Triage Record Num: [id number] Operator: Kerby Moors Patient Name: Andrew Malone Call Date & Time: 04/27/2010 4:03:38PM Patient Phone: [phone] PCP: Illene Regulus Patient Gender: Male PCP Fax : [phone] Patient DOB: 06-Dec-1940 Practice Name: [facility] Reason for Call: Brenda/Spouse, calling about diarrhea almost every hour today and weakness. Does not have thermometer to take temp but pt was sweaty this AM. Onset 2/25. Has been giving Kaopectate with minimal relief. Still taking fluids, mild to moderate abd cramping at times. All emergent sxs r/o advised to be seen within 24hrs, home care advice given. Protocol(s) Used: Diarrhea or Other Change in Bowel Habits Recommended Outcome per Protocol: See Provider within 24 hours Reason for Outcome: Diarrhea lasting longer than 24 hours AND not improving with home care Care Advice: Diarrheal Care: - Drink 2-3 quarts (2-3 liters) per day of low sugar content fluids, including over the counter oral hydration solution, unless directed otherwise by provider. - If accompanied by vomiting, take the fluids in frequent small sips or suck on ice chips. - Eat easily digested foods (such as bananas, rice, applesauce, toast, cooked cereals, soup, crackers, baked or boiled potato, or baked chicken or [place] without skin). - Do not eat high fiber, high fat, high sugar content foods, or highly seasoned foods. - Do not drink caffeinated or alcoholic beverages. - Avoid milk and milk products while having symptoms. As symptoms improve, gradually add back to diet. - Application of A&D ointment or witch hazel medicated pads may help anal irritation. - Antidiarrheal medications are usually unnecessary. If symptoms are severe, consider nonprescription antidiarrheal and anti-motility drugs as directed by label or a provider. Do not  take if have high fever or bloody diarrhea. If pregnant, do not take any medications not approved by your provider. - Consult your provider for  Initial call taken by: Margaret Pyle, CMA,  April 28, 2010 8:30 AM  Follow-up for Phone Call        Pt has appt today with [facility] at 3:15p Follow-up by: Margaret Pyle, CMA,  April 28, 2010 9:07 AM

## 2010-05-08 NOTE — Assessment & Plan Note (Signed)
Summary: diarrhea/nausea   Vital Signs:  Patient profile:   70 year old male Height:      69 inches Weight:      187 pounds BMI:     27.71 O2 Sat:      96 % on Room air Temp:     98.0 degrees F oral Pulse rate:   77 / minute BP supine:   112 / 72  (right arm) BP sitting:   112 / 72  (right arm) BP standing:   126 / 80  (right arm) Cuff size:   regular  Vitals Entered By: Bill Salinas CMA (April 28, 2010 4:05 PM)  O2 Flow:  Room air CC: pt here with c/o diarrhea, nausea and weakness x 3 days. Pt has been taking immodium with some relief. Denies blood in his stool and decreased appetite/ ab   Primary Care Provider:  Kealani Leckey  CC:  pt here with c/o diarrhea and nausea and weakness x 3 days. Pt has been taking immodium with some relief. Denies blood in his stool and decreased appetite/ ab.  History of Present Illness: Andrew Malone presents for diarrhea.   He reports that he felt ill last Thursday night. Friday he startd having diarrhea with up to 3-4 stools per hour but with low volume. No blood or mucus in the stool. He had no appetite and hasn't been eating. He has had night sweats and fever. By Sunday, after taking some kaopectate and the immodium diarrhea has decreased but he continues to feel bad.   Current Medications (verified): 1)  Lipitor 80 Mg Tabs (Atorvastatin Calcium) .... Take One Tablet By Mouth Daily. 2)  Prilosec 20 Mg  Cpdr (Omeprazole) .... Two Times A Day 3)  Clonazepam 0.5 Mg  Tabs (Clonazepam) .... Take 1 Tab By Mouth and Q8 Prn 4)  Hydrochlorothiazide 25 Mg Tabs (Hydrochlorothiazide) .Marland Kitchen.. 1 By Mouth Once Daily 5)  Aspirin Adult Low Strength 81 Mg Tbec (Aspirin) .... Take 1 By Mouth Qd 6)  Vicodin Es 7.5-750 Mg Tabs (Hydrocodone-Acetaminophen) .Marland Kitchen.. 1 Q 6 Hrs As Needed Pain 7)  Acyclovir 200 Mg Caps (Acyclovir) .Marland Kitchen.. 1 Three Times A Day As Needed  Allergies (verified): No Known Drug Allergies  Past History:  Past Medical History: Last updated:  12/30/2009 HYPERLIPIDEMIA  CAROTID ARTERY DISEASE CVA  HYPERTENSION  SEIZURE DISORDER DEPRESSION  ANXIETY HIATAL HERNIA, HX OF  GLOMERULONEPHRITIS  PLANTAR FASCIITIS GERD  FH reviewed for relevance, SH/Risk Factors reviewed for relevance  Review of Systems GI:  Complains of abdominal pain, change in bowel habits, diarrhea, loss of appetite, and nausea; denies bloody stools, constipation, dark tarry stools, gas, hemorrhoids, indigestion, and vomiting.  Physical Exam  General:  WNWD white male in no acute distress Head:  atraumatic.   Eyes:  C&S clear without icterus Neck:  supple.   Lungs:  normal respiratory effort and normal breath sounds.   Heart:  normal rate and regular rhythm.   Abdomen:  soft, normal bowel sounds, no distention, no guarding, no rebound tenderness, and no hepatomegaly.  Mild diffuse tenderness noted.  Neurologic:  alert & oriented X3 and cranial nerves II-XII intact.   Skin:  turgor normal, color normal, and no suspicious lesions.   Psych:  Oriented X3, memory intact for recent and remote, normally interactive, and good eye contact.     Impression & Recommendations:  Problem # 1:  DIARRHEA (ICD-787.91) Symptoms and exam seems c/w viral gastroenteritis.  Plan - lab to r/o infectious process or metabolic  derangement  Orders: TLB-BMP (Basic Metabolic Panel-BMET) (80048-METABOL) TLB-CBC Platelet - w/Differential (85025-CBCD)  Addendum - labs are normal.  Plan - hydrate           BRAT diet          call if symptoms aren't improving.   Complete Medication List: 1)  Lipitor 80 Mg Tabs (Atorvastatin calcium) .... Take one tablet by mouth daily. 2)  Prilosec 20 Mg Cpdr (Omeprazole) .... Two times a day 3)  Clonazepam 0.5 Mg Tabs (Clonazepam) .... Take 1 tab by mouth and q8 prn 4)  Hydrochlorothiazide 25 Mg Tabs (Hydrochlorothiazide) .Marland Kitchen.. 1 by mouth once daily 5)  Aspirin Adult Low Strength 81 Mg Tbec (Aspirin) .... Take 1 by mouth qd 6)  Vicodin Es  7.5-750 Mg Tabs (Hydrocodone-acetaminophen) .Marland Kitchen.. 1 q 6 hrs as needed pain 7)  Acyclovir 200 Mg Caps (Acyclovir) .Marland Kitchen.. 1 three times a day as needed  Patient Instructions: 1)  Diuarrhea- sound like a viral gastroenteritis. No drop in blood pressure with position change indicating that you are NOT dehydrated. Will check blood counts and chemistry panel to rule out infection and metabolic derangement. OK to continue with immodium. Follow a BRAT diet = very bland food. Keep up the hydration. Call for persistent diarrhea and we will get stool studies.  Prescriptions: ACYCLOVIR 200 MG CAPS (ACYCLOVIR) 1 three times a day as needed  #90 x 3   Entered and Authorized by:   Jacques Navy MD   Signed by:   Jacques Navy MD on 04/28/2010   Method used:   Electronically to        [facility] (retail)       [address]       [place], [place]  [address]       Ph: [phone]       Fax: [phone]   RxID:   [id number]    Orders Added: 1)  TLB-BMP (Basic Metabolic Panel-BMET) [80048-METABOL] 2)  TLB-CBC Platelet - w/Differential [85025-CBCD] 3)  Est. Patient Level III [24401]

## 2010-05-13 NOTE — Letter (Signed)
[facility]   Imported By: Sherian Rein 05/09/2010 14:26:19  _____________________________________________________________________  External Attachment:    Type:   Image     Comment:   External Document

## 2010-05-19 ENCOUNTER — Telehealth: Payer: Self-pay | Admitting: Internal Medicine

## 2010-05-29 NOTE — Progress Notes (Signed)
  Phone Note Refill Request Message from:  Fax from Pharmacy on May 19, 2010 1:56 PM  Refills Requested: Medication #1:  VICODIN ES 7.5-750 MG TABS 1 q 6 hrs as needed pain   Last Refilled: 03/26/2010 Please Advise refills  Initial call taken by: Ami Bullins CMA,  May 19, 2010 1:56 PM  Follow-up for Phone Call        ok to refill x 3 Follow-up by: Jacques Navy MD,  May 19, 2010 6:06 PM    Prescriptions: VICODIN ES 7.5-750 MG TABS (HYDROCODONE-ACETAMINOPHEN) 1 q 6 hrs as needed pain  #60 x 3   Entered by:   Bill Salinas CMA   Authorized by:   Jacques Navy MD   Signed by:   Bill Salinas CMA on 05/19/2010   Method used:   Telephoned to ...       [facility] (retail)       [address]       [place], [place]  [address]       Ph: [phone]       Fax: [phone]   RxID:   [id number]

## 2010-07-15 NOTE — Assessment & Plan Note (Signed)
[facility]                            CARDIOLOGY OFFICE NOTE   Keawe, Marcello MAURICO PERRELL                      MRN:          [id number]  DATE:07/15/2007                            DOB:          December 21, 1940    HISTORY OF PRESENT ILLNESS:  Mr. Voland is a very pleasant gentleman  who has a history of nonobstructive coronary disease as well as  cerebrovascular disease.  Catheterization in January 2007 showed  nonobstructive coronary disease, normal LV function with ejection  fraction of 60%.  The patient also has cerebrovascular disease.  Since I  last saw him, he is doing well from a symptomatic standpoint.  There is  no dyspnea on exertion, orthopnea, PND, pedal edema, palpitations,  presyncope, syncope or chest pain.   CURRENT MEDICATIONS:  1. Vytorin 10/80 daily.  2. Plavix 75 mg daily.  3. Prilosec 20 mg p.o. b.i.d.  4. Aspirin 325 mg p.o. daily.   PHYSICAL EXAMINATION:  VITAL SIGNS:  Blood pressure of 143/82.  His  pulse of 67.  He weighs 198 pounds.  HEENT is normal.  NECK:  Supple.  CHEST:  Clear.  CARDIOVASCULAR:  Regular rate and rhythm.  There is a 2/6 systolic  ejection murmur in the left sternal border.  ABDOMEN:  Shows no tenderness.  EXTREMITIES:  Show no edema.   STUDIES:  Electrocardiogram shows sinus rhythm at a rate of 64.  There  is left axis deviation, but there are no ST changes noted.   DIAGNOSIS:  1. Cerebrovascular disease.  Mr. Cuffe is due for follow-up carotid      Dopplers and we will arrange those.  He will continue on his statin      as well as his aspirin.  I do not think he needs Plavix at this      point, and we will discontinue that.  2. History of nonobstructive coronary disease by catheterization.  He      will continue on his statin and his aspirin.  3. Hyperlipidemia.  He will continue on his Vytorin, and we will check      lipids and liver when he returns for carotid Dopplers.  4. History of hypertension.   His blood pressure appears to be      reasonable on no medications.  5. History of glomerular nephritis.   The patient will continue with risk factor modification.  We will see  him back in one year.  Note he does not smoke.     Madolyn Frieze Jens Som, MD, [facility]  Electronically Signed    BSC/MedQ  DD: 07/15/2007  DT: 07/15/2007  Job #: [id number]

## 2010-07-15 NOTE — Consult Note (Signed)
NAME:  Andrew Malone, Andrew Malone NO.:  [id number]   MEDICAL RECORD NO.:  [id number]          PATIENT TYPE:  INP   LOCATION:  2019                         FACILITY:  [facility]   PHYSICIAN:  Gustavus Messing. Orlin Hilding, M.D.DATE OF BIRTH:  22-Jun-1940   DATE OF CONSULTATION:  DATE OF DISCHARGE:                                 CONSULTATION   NEUROLOGY CONSULTATION:   CHIEF COMPLAINT:  Headache, neck pain, chest pain, and back pain.   HISTORY OF PRESENT ILLNESS:  [name] is a 70 year old right-handed  white man who was recently seen at [facility] by Dr. Thad Ranger for a mid-right  back pain which has been present now for nine months.  Dr. Thad Ranger was  unable to find a neurological source and ordered a CT of the abdomen  which was negative according to the patient.  He also had nerve  conduction EMG studies in February of 2008 which were normal.  The  patient was admitted yesterday after he had a severe global throbbing  headache and neck pain and chest pain, all together a 10/10, worst ever  in his life.  CT of the brain was negative for blood.  Headache is  resolved somewhat, now a 3/10, although he does have Dilaudid ordered as  well as Tylenol, Xanax, clonazepam, hydrocodone, Robaxin, etc.  He now  lists this as a 3/10.  He did have some photophobia, nausea, vomiting,  and slow speech according to his wife which are all resolved.  He has no  prior history of headache or migraine.  He did have a cerebral angiogram  done on January 11, 2003, for some dizziness and paresthesias.  He was  found to have 35-40% right ICA stenosis and a 50% right nondominant  vertebral artery stenosis at the origin.  There was no mention of any  aneurysm then.  MRI of the brain done yesterday was essentially normal,  but an MR angiogram was read as a possible 2.3-mm right cavernous ICA  aneurysm.   REVIEW OF SYSTEMS:  Twelve systems were reviewed which included  constitutional, eyes, ENT, cardiovascular,  respiratory, GI, GU,  musculoskeletal, skin, neurologic, metabolic, hematologic, and allergic.  Refer to the history of present illness.  Otherwise, negative for fever,  chills, eye pain, visual change, ear pain or congestion, dyspnea, cough  or shortness of breath, nausea, vomiting or diarrhea.  No dysuria, no  rash or itching, no weakness or numbness, no anxiety or depression, no  polydipsia or polyuria, no anemia or bleeding.   PAST MEDICAL HISTORY:  Significant for hypertension,  hypercholesterolemia, anxiety, and depression chronically, remote  cardiac catheterization with non-occlusive disease, hernia surgeries,  issues with reflux, recent endoscopy, followed for a right internal  carotid artery stenosis which is described as 60% to 80% but was only  35% to 40% as of the angiogram which she had on the 4th.  She has a  history of glomerulonephritis and some skin cancers.   CURRENT MEDICATIONS:  Aspirin 325 mg daily, Plavix 75 mg a day, Vytorin  one a day, hydromorphone.  He has gotten a dose today.  He got 2 mg  yesterday twice.  He has had Zofran.  He is also on Protonix.  He also  has Vicodin and Robaxin orders as well.   SOCIAL HISTORY:  He is married.  He lives with his wife.  He continued  to work in maintenance.  He denies tobacco, alcohol, or illicit drugs.   FAMILY HISTORY:  Positive for stroke, heart disease, hypertension,  hypercholesterolemia, diabetes, thyroid disease, migraine, and lung  cancer.   OBJECTIVE:  VITAL SIGNS:  Temperature is 98.5, pulse is 70, respirations  20, blood pressure is 146/84, and oxygen saturation is 96% on 2 liters.  HEENT:  Head is normocephalic and atraumatic.  NECK:  Supple without rigidity.  He has a soft right carotid bruit.  HEART:  Regular rate and rhythm.  LUNGS:  Clear to auscultation.  NEUROLOGIC EXAM:  Mental status:  He is awake and alert, fully oriented  with normal language and cognition.  Cranial nerve examination:  Pupils   are equal and reactive.  Visual fields are full.  Extraocular movements  are intact.  Facial sensation is normal.  Facial motor activity is  normal.  Hearing is intact and symmetric.  Tongue is midline.  There is  no dysarthria.  On motor exam, there is no drift.  He has normal  strength, bulk, and tone throughout.  No tremor, fasciculations, or  atrophy.  Coordination:  Finger-to-nose and heel-to-shin are normal.  Sensory exam was normal.   MRI scan, as above, showed basically normal MRI.  It was interpreted as  showing a possible aneurysm in the right cavernous ICA which I think is  an over-read.   Labs are unremarkable and are recorded separately.   IMPRESSION:  Transient severe headache with migrainous features.  No  previous history of migraine, however.  There is no evidence of blood by  CT or MRI.  There is a question of a 2.3 mm aneurysm in the right ICA by  MRA.  I seriously doubt that this is either real or related to his  current syndrome.  However, a repeat catheter angiogram would be the  only way to confirm this conclusively, and he is not interested in  pursuing that at present.  A spinal AVM with intermittent bleeding is  another consideration.  That would require a spinal arteriogram.  Once  again, the patient is not interested in pursuing that.   RECOMMENDATIONS:  Under the circumstances, we will just treat his pain  empirically.  It is not likely to be meningitis with his rapid recovery.  Lumbar puncture probably not of benefit now but could be done under  fluoroscopy if he wanted to pursue, or if symptoms recurr, to rule out a  slow leak from AVM or aneurysm.  Would recommend that he follow up with  Dr. Thad Ranger as needed.      Catherine A. Orlin Hilding, M.D.  Electronically Signed     CAW/MEDQ  D:  08/18/2006  T:  08/18/2006  Job:  [id number]

## 2010-07-15 NOTE — Discharge Summary (Signed)
NAME:  Andrew Malone, Andrew Malone NO.:  [id number]   MEDICAL RECORD NO.:  [id number]          PATIENT TYPE:  INP   LOCATION:  2019                         FACILITY:  [facility]   PHYSICIAN:  Raenette Rover. Felicity Coyer, MDDATE OF BIRTH:  1940/12/21   DATE OF ADMISSION:  08/17/2006  DATE OF DISCHARGE:  08/19/2006                               DISCHARGE SUMMARY   DISCHARGE DIAGNOSES:  1. Transient severe global headache with migrainous features:  See      below, resolved, negative workup status post neurology evaluation.  2. Paraesthesias and chest pain and tingling, nausea:  Negative      evaluation this hospitalization, no evidence of neurologic or      cardiac event.  3. Chronic mid thoracic and right flank pain:  Unexplained etiology      with repeated and negative evaluation:  See details below, continue      Dilaudid b.i.d. p.r.n.  4. Dyslipidemia.  5. History of coronary disease:  Last catheterization, January of      2007.  Continue medical management.  6. Peripheral vascular disease.  7. Right internal carotid stenosis:  Repeat ultrasound and Doppler, 50-      80% at higher end of scale.  Continue medical management.  8. Gastroesophageal reflux disease with hiatal hernia:  Continue      proton pump inhibitors.   DISCHARGE MEDICATIONS:  1. Dilaudid 4 mg p.o. b.i.d., pain, dispensed 40 with no refills.  2. Plavix 75 mg once daily.  3. Aspirin 325 mg once daily.  4. Vytorin 10/80 mg once daily.  5. Prilosec 20 mg b.i.d.  6. Klonopin 0.5 mg p.o. q.h.s. and every 4 hours p.r.n. anxiety.   CONSULTS THIS HOSPITALIZATION:  Includes [facility] by Dr.  Marcelino Freestone.  Hospital followup was scheduled with primary care  physician, Dr. Illene Regulus for Wednesday, July 2, at 11 a.m. and with  neurologist Dr. Thad Ranger as needed.   PROCEDURES THIS HOSPITALIZATION:  Include an MRI/MRA of the brain on  June 17.  Negative for acute ischemic event or other abnormalities.   MRI  with questionable 2- or 3-mm aneurysm of right ICA, not previously seen  on cerebral angio in November of 2004.  See neuro consult for further  details.  Also, nuclear bone scan on August 18, 2006, negative studying  for etiology of back pain.   DISPOSITION:  The patient is discharged home and medically stable and in  improved condition.  He is currently at baseline, feels well.  No  nausea, vomiting, pain or tingling.  Tolerating p.o. and ready for  discharge home.   HOSPITAL COURSE BY PROBLEM:  1. Severe transient headache:  The patient is a 70 year old gentleman      with history of coronary disease who has had recent ongoing      evaluation for recurring mid thoracic and right flank pain but with      an elusive etiology.  On the day of admission, his complaint was      that of a severe global headache extending down his neck and chest  with paresthesias and numbness from the umbilicus upward involving      both upper extremities.  This was associated with nausea and      vomiting prior to admission, photophobia as well as sensitivity to      sound.  Initially, the headache was 10/10.  Head CT in the      emergency room was negative for bleed.  After Dilaudid, the pain      was reduced to a 5/10, but due to the dull nature of the pain, he      was referred for further evaluation.  He was therefore admitted,      planned to undergo an MRI and MRA, which were negative for any  any      acute or abnormal event.  There was questionable small aneurysm      mentioned on MRA with rest and Dilaudid medication, pain      medication, decreased his headache to 2/10 the following morning,      and because of the abnormality, this was a possible diagnosis of      migraine with headache features.  He was seen in consultation by      neurology.  Dr. Marcelino Freestone saw the patient, agreed that      there were migrainous features, but given a previous history of      migraine, it was  difficult to determine if this was the case.  She      also doubted significant findings on the MRA reading but did      recommend that a repeat catheter angio could confirm or deny the      suspicions, but at this time, the patient felt this was      unnecessary, and neurology did not feel it was urgent.  Likewise,      in consideration would be a spinal arteriovenous malformation with      bleeding.  That could be evaluated by a spinal arteriogram but      again, the patient is not interested, nor is there urgent need      given resolution of his symptoms.  By the following day, all of the      patient's symptoms had resolved.  He had no headache and was      scheduled for discharge home.  As such, we will continue him only      on Dilaudid p.r.n. with consideration of further migraine      evaluation should this recur and followup with neurology p.r.n.; no      further test plans are recommended at this time.  2. Unexplained mid thoracic pain:  The patient has had an extensive      evaluation of this in the recent past and included an MRI of the      thoracic and lumbar spine, February of 2008 with general, regular      findings.  February of 2008, a negative EMG for neuropathy.  CT      abdomen, May of 2008, negative for abnormality.  Cardiac      catheterization, January of 2008, with 70% stenosis of the ostial      diagonal, normal left main, and 40% proximal LAD; all which were      found amenable to medical management.  He has had an EGD in October      of 2007, showing only  gastritis changes with a small hiatal hernia      and colonoscopy  in September of 2007 showed a  right      diverticulosis.  He had normal laboratory workup in January of      2008, including a normal sed rate, normal CBC, normal CMET.      Rheumatoid factor less than 20, and normal B12 and TSH.  These labs     were repeated during his hospitalization and were again normal B12,      normal TSH, sed rate of  only 2, negative cardiac enzymes, and      normal CBC and CMET.  A nuclear medical bone scan of the body was      performed to rule out other abnormalities and then to be      identified.  The patient has no complaints beyond his baseline at      this time, and further observation and followup will be pursued as      needed.  3. Other chronic medical issues:  The patient's other medical issues      are as listed above.  No changes in his regimen except for the      increased addition of Dilaudid p.r.n.      Valerie A. Felicity Coyer, MD  Electronically Signed     VAL/MEDQ  D:  08/19/2006  T:  08/19/2006  Job:  [id number]

## 2010-07-15 NOTE — Assessment & Plan Note (Signed)
[facility]                           PRIMARY CARE OFFICE NOTE   Andrew Malone, Andrew Malone                      MRN:          [id number]  DATE:07/20/2006                            DOB:          1940-11-18    Andrew Malone is a 70 year old gentleman who is well known to the  practice.  He is followed for GERD, plantar fasciitis, history of CVA in  the past, history of hypertension, history of glomerular nephritis, and  ongoing problem with back pain that has been inexplicable to date.   The patient has had a cardiac evaluation x2 with the last cardiac  catheterization in January 2007 revealing nonobstructive coronary  disease, except for a 70% ostial regions on the 1st diagonal, which was  rated as a very small 1.5-mm vessel, and did not require any  intervention.  Of note, the patient is on Plavix and aspirin because of  his history of CVA.   The patient reports today he had substernal discomfort, tightness, and  pressure accompanied by diaphoresis.  This was short lived, and not  exertionally related.  He was seen by the company nurse at the time of  this event, and his vital signs were stable, and he was apparently doing  well.  The patient was brought in to see me for further evaluation.  At  the time I am seeing on the 8th, he reports he has had a little bit of  intermittent chest pressure and tightness similar to episodes of GERD in  the past.   CURRENT MEDICATIONS:  1. Vytorin 10/80.  2. Plavix 75 mg daily.  3. Aspirin 325 mg daily.  4. Prilosec 20 mg b.i.d.  5. Levaquin 500 mg daily.  6. Dilaudid 4 mg tabs once daily.  7. Clonazepam 2 tablets nightly.   REVIEW OF SYSTEMS:  Really unremarkable, except for problems as outlined  above.   EXAMINATION:  Temperature was 97.9.  Blood pressure 137/88.  Pulse is  72.  Weight 198.  GENERAL APPEARANCE:  This is a well-nourished, well-developed, mildly  overweight, Caucasian male, in no acute  distress.  CHEST:  Clear with no rales, wheezes, or rhonchi.  CARDIOVASCULAR:  Exam revealed 2+ regular pulse.  His precordium was  quiet, and he had regular rate and rhythm.  No murmurs, rubs, or gallops  were appreciated.  ABDOMEN:  Was soft and non-tender.   Twelve-lead electrocardiogram revealed normal sinus rhythm, normal EKG,  and was unchanged from previous study.   ASSESSMENT AND PLAN:  Atypical chest pain.  The patient has  nonexertional chest pain, which is located in his substernal region  similar to esophageal irritation and gastroesophageal reflux disease in  the past.  His cardiac catheterization was reviewed as noted.  EKG is  normal.  I discussed with both the patient as well as his cardiologist.  Cardiology felt that it was our judgment since he was not seeing the  patient.  Discussed this fully with the patient.  At this time, the plan  will be to add liquid antacid to his current regimen.  If he does  not  see prompt relief with this, or if he has persistent or breakthrough  chest discomfort, we could then move forward with further cardiac  evaluation.   The patient clearly understands if he has persistent chest pain, chest  discomfort, radiation to his jaw or his arm, sweats, or shortness of  breath, this would require immediate evaluation night or day.  He can  either call the office during working hours, or if it is after hours, I  would recommend going to [facility] [facility] and registering as a [facility] patient.     Rosalyn Gess Norins, MD  Electronically Signed    MEN/MedQ  DD: 07/20/2006  DT: 07/20/2006  Job #: [id number]

## 2010-07-15 NOTE — Assessment & Plan Note (Signed)
[facility]                           PRIMARY CARE OFFICE NOTE   Andrew Malone, Andrew Malone                      MRN:          [id number]  DATE:09/01/2006                            DOB:          Andrew Malone    [name] has a longstanding history of over a year of chronic pain in  his right flank.  Evaluation to date has been negative including  Gastroenterology consultation, Neurology consultation, nerve conduction  and EMG studies, CT scan of the abdomen and chest, MRI scan of the  thoracic spine, total body bone scan.  The continues to have significant  discomfort.  He reports his Dilaudid gives him only minimal relief, and  he stopped taking it except for every 2nd or 3rd day.  He reports that  the pain is severe and significant all day long.   Patient was recently hospitalized for severe headache.  Studies at that  time, including MRI and MRA, were negative except for a small saccular  change in the carotid artery on the left.  It could not be determined if  it is aneurysmal or not.  Patient was seen in consultation by Dr.  Marcelino Freestone, who felt there was no specific neurologic deficit or  abnormality.  Patient reports the headache resolved, but he has a  feeling that he is some how different.  He has slowed speech, prolonged  pronunciation of vowels, and reports that he has become very antisocial  and somewhat irritable.   This patient is very clear he does not want further evaluation, testing,  or treatment referrals.   CURRENT MEDICATIONS:  1. Vytorin 10/80 once daily.  2. Plavix 75 mg daily.  3. Aspirin 325 mg daily.  4. Prilosec 20 mg b.i.d.  5. Clonazepam 1 tablet t.i.d.  6. Dilaudid 4 mg q.8 hours p.r.n.   REVIEW OF SYSTEMS:  Negative, except for the HPI.   VITAL SIGNS:  Temperature 97.8.  Blood pressure 143/81.  Pulse is 70.  Weight 200.  No examination was conducted.   ASSESSMENT AND PLAN:  1. Seizure disorder.  Patient is  stable.  He will continue on      clonazepam.  2. Lipids.  Patient is well controlled on Vytorin, which he is      tolerating well, and he will continue the same.  3. History of stroke.  Patient is on Plavix and aspirin as      anticoagulation therapy.  4. Flank pain.  No etiology is determined for this pain, which is      persistent.  At this time, we will give a trial of Lidoderm 5%      patch applied daily, on 12, off 12.  Prescription is provided.  5. Neurologic.  Patient with a negative evaluation including MRI, MRA,      Neurology consult.  At this point, I would have the patient      continue on his present medical regimen only.  We did      talk about the vegetative signs of depression, but he feels he has  none of these at this time.  Patient was asked to return to see me      on a p.r.n. basis.     Andrew Gess Norins, MD  Electronically Signed    MEN/MedQ  DD: 09/01/2006  DT: 09/02/2006  Job #: [id number]   cc:   Andrew Malone

## 2010-07-18 NOTE — Assessment & Plan Note (Signed)
[facility]                           GASTROENTEROLOGY OFFICE NOTE   Andrew Malone, Andrew Malone                      MRN:          [id number]  DATE:01/18/2006                            DOB:          Jun 01, 1940    HISTORY:  Andrew Malone presents today for followup.  He was evaluated on  December 11, 2005, for recurrent problems with vomiting.  See that dictation  for details.  He underwent upper endoscopy, December 16, 2005.  The  examination was normal except for the presence of a sliding hiatal hernia.  In addition to vomiting, he had complained of dysphagia.  He was empirically  dilated with a 54 [place] Maloney dilator.  He was to continue Prilosec 20 mg  b.i.d. and adhere to reflux precautions.  He presents today for followup.  Since his procedure, he reports marked improvement in symptoms.  He states  he has had no problems.  He has had a rare episode of regurgitation or  vomiting.  He states he feels good.  His medications are unchanged.   PHYSICAL EXAMINATION:  Finds a well-appearing male in no acute distress.  Blood pressure 136/78.  Heart rate 68.  Weight is 200 pounds (increased 3.2  pounds).  ABDOMEN:  Soft without tenderness, mass, or hernia.  No succussion splash.   IMPRESSION:  1. Prior problems with intermittent vomiting of uncertain cause.  2. Intermittent dysphagia status post upper endoscopy with esophageal      dilation, improved.  3. Reflux disease.  Stable on Prilosec 20 mg b.i.d.   RECOMMENDATIONS:  1. Continue daily proton-pump inhibitor therapy.  2. Continue reflux precautions.  3. GI followup p.r.n.     Andrew Malone. Eda Keys., MD  Electronically Signed    JNP/MedQ  DD: 01/18/2006  DT: 01/19/2006  Job #: [id number]

## 2010-07-18 NOTE — Discharge Summary (Signed)
NAME:  Andrew Malone, Andrew Malone NO.:  [id number]   MEDICAL RECORD NO.:  [id number]          PATIENT TYPE:  INP   LOCATION:  3707                         FACILITY:  [facility]   PHYSICIAN:  Cornell Barman, P.A. LHCDATE OF BIRTH:  1940/08/06   DATE OF ADMISSION:  05/12/2004  DATE OF DISCHARGE:  05/13/2004                                 DISCHARGE SUMMARY   DISCHARGE DIAGNOSES:  1.  Atypical chest pain.  2.  Myocardial infarction ruled out.  3.  Mild glucose intolerance.  4.  Generalized fatigue.  5.  Headache.   BRIEF ADMISSION HISTORY:  Andrew Malone is a 70 year old white male who  presents with complaints of headache, chest pain, left hand pain. He states  he was up around 5:30 a.m. with headache. He was able to go back to sleep  and then woke up at 7:30 a.m. with chest pain and left hand pain. The pain  gradually eased off. This was not associated with diaphoresis, dyspnea, or  presyncope; however, the wife notes that he looked extremely pale. It was at  the wife's insistent that the patient came to the emergency department.   PAST MEDICAL HISTORY:  1.  History of nonobstructive coronary disease by catheterization May 2000      with normal LV function.  2.  Status post nonischemic Cardiolite in November 2003.  3.  Hypertension.  4.  Hyperlipidemia.  5.  Glomerulonephritis.  6.  Peripheral vascular disease with right ICA stenosis of 60 to 80% in May      of 2005.  7.  Cerebral angiogram July of 2004 to rule out right ICA dissection. This      revealed 50% stenosis at the origin of the right vertebral basilar      artery, this was nondominant, and about 50% right ICA stenosis      proximally. It was recommended the patient be on aspirin and Plavix.   HOSPITAL COURSE:  1.  Cardiovascular. The patient presented with atypical chest pain. Serial      cardiac enzymes were negative. EKG was without ischemia x2. The patient      has history of nonobstructive coronary disease  and preserved LV function      and a history of nonischemic Cardiolite. It has been three years since      his last Cardiolite, and we will schedule an adenosine Myoview to be      done as an outpatient.  2.  Peripheral vascular disease. The patient has known right ICA stenosis by      carotid Dopplers. This is at 60 to 80% by ____________________ it is      50%. The patient has not had any symptoms of right ICA stenosis except      for prompt transient left hand pain. He has also had some headache. It      is at the patient's request that we will repeat his carotid Dopplers. It      is not clear if he also should may be have repeat cerebral angiogram if      we are monitoring  the stenosis.  3.  Mild glucose intolerance. The patient's blood sugars were 110 to 114      with a normal hemoglobin A1c.  4.  Generalized fatigue. The patient describes generalized fatigue and low      energy over the past four months. No specific etiology has been      identified. Does not appear to be cardiac in nature, nor does it appear      to be pulmonary as his O2 saturations are normal. I do wonder if perhaps      it is a medication side effect, specifically perhaps his      antihypertensive. The patient's blood pressures have been in the low      100s prior to admission and during this admission.  5.  Headache. The patient has presented with complaints of a headache. The      patient's headache was not relieved with morphine although it is      somewhat lessened. We will try one dose of Toradol to see if this is      effective. The patient's head CT on admission was negative for any acute      abnormality. As noted, the patient is on aspirin and Plavix given his      right ICA stenosis.   LABORATORY DATA AT DISCHARGE:  TSH was 0.965. Hemoglobin A1c was 5.9%. FLP  is still pending. BUN 17, creatinine 0.9. Head CT showed no acute  abnormality. Chest x-ray was negative. O2 saturations were 96% on room  air.   MEDICATIONS AT DISCHARGE:  1.  Zocor 40 mg q.d.  2.  Plavix 75 mg q.d.  3.  Aspirin 81 mg q.d.  4.  Prevacid 30 mg 2 tablets q.d.  5.  Clonazepam t.i.d. as at home.  6.  Prozac 20 mg q.d.  7.  Antihypertensive as at home.   FOLLOW UP:  Follow up for an adenosine Myoview Wednesday, March 15 at 11:45  a.m. We also are arranging outpatient followup with Dr. Debby Bud.      LC/MEDQ  D:  05/13/2004  T:  05/13/2004  Job:  [id number]   cc:   Rosalyn Gess. Norins, M.D. [facility]   Olga Millers, M.D. [facility]

## 2010-07-18 NOTE — Letter (Signed)
May 04, 2006    [name] A. Orlin Hilding, M.D.  [address]  [address]  [place], [place] [address]   RE:  Andrew Malone, Andrew Malone  MRN:  [id number]  /  DOB:  1940-03-22   Dear Dr. Orlin Hilding:   Thank you very much for seeing [name], a pleasant 70 year old  gentleman, for refractory pain of unknown etiology that starts in his  mid to low back and radiates to his right chest.  The patient has had  this pain for more than 6 months.  He has been seen on multiple  occasions in the office for examination, but has been unable to  determine an etiology for his discomfort.  He reports that the pain has  become progressively worse, going from an intermittent shooting  discomfort to a fairly constant pain and discomfort.  I have recently  started him on Dilaudid for pain control, since his pain was becoming  disabling and interfering with his ability to work.   The patient has had a thorough cardiology evaluation looking for signs  of origin of his discomfort with negative results.  The patient had a  cardiac cath, March 25, 2005, which showed no obstructive disease,  mild aortic regurgitation and a normal ejection fraction.  He had a CT  scan of the chest with angiography which showed no evidence of pulmonary  embolus.  He had an outpatient echocardiogram which showed normal left  ventricular function.  He has had outpatient Dopplers which have been  unremarkable.   The patient has had additional studies including EMG study performed  April 06, 2006, which was a normal study with no electrophysiologic  evidence of focal neuropathy, no evidence of radiculopathy involving the  right lower extremity irritation or involving the lower thoracic and  lumbosacral paraspinal.  CT of the chest is noted, which was  unremarkable, chest x-rays, which have been stable.  The patient did  undergo MRI of the thoracic and lumbar spines, April 19, 2006, at  [facility], available at [employer],  which showed a bilateral L5  pars defect with grade 1 anterolisthesis.  No spinal stenosis or nerve  root encroachment was demonstrated.  The L5-S1 disk was desiccated with  an annular rent in the left paracentral location.  There were no other  significant disk space findings.  The patient has had routine  laboratories as recently as March 19, 2006 which showed a normal CBC  and normal comprehensive metabolic panel.  Sed rate was normal at 5.  Rheumatoid factor was normal at less than 20.  B12 was normal at 369.  TSH was normal at 1.81.  The patient incidentally had a lipid panel  which was normal with an LDL of 68.  I do not believe that any of his  symptoms were related to dermatomyositis or myositis secondary to statin  drugs.   PAST MEDICAL HISTORY:   SURGICAL:  Bilateral inguinal hernia repairs in 2001.   MEDICAL:  1. The patient had the usual childhood diseases.  2. GERD.  3. Plantar fasciitis.  4. Herpes labialis.  5. Question of CVA in the past.  6. Hypertension.  7. Glomerulonephritis in the remote past.  8. Hyperglycemia.  9. Onychomycosis.   In reviewing his chart, his last EGD was December 16, 2005, which showed  GERD and hiatal hernia.  Last colonoscopy was September 07, 2005, which was  unremarkable, except for diverticulosis.  Last cardiac catheterization  as noted was March 25, 2005.  Last stress Cardiolite was performed  May 14, 2004 and was notable for an ejection fraction of 65%.   If I can provide any additional information in regards to this nice  patient, please do not hesitate to contact me.   I appreciate your assistance in evaluating [name] in regards to the  origins of his pain and discomfort in ways to find him better relief  other than narcotic medications.  I look forward to hearing from you and  remain yours truly.    Sincerely,     Rosalyn Gess. Norins, MD  Electronically Signed   MEN/MedQ  DD: 05/04/2006  DT: 05/04/2006  Job #:  [id number]

## 2010-07-18 NOTE — Assessment & Plan Note (Signed)
[facility]                                 ON-CALL NOTE   Andrew Malone, Andrew Malone                      MRN:          [id number]  DATE:04/01/2006                            DOB:          1940/11/10    A [facility] Patient.  Phone number [phone].   Wife, Andrew Malone, calls, and states that her husband  has severe upper  back pain, and she has been trying to call for the last hour for  advice, and no one will answer at Dr. Debby Bud' office.  The call came in  to me just after 8:15 in the morning, and I called her back at 8:25, and  explained to her that the office lines were probably just not open yet.  According to her, she is not with her husband who is at work, but is  complaining of pain, and they won't let him do anything, but if his  pain gets really bad he is hard to handle.  He apparently has been  evaluated over the last 2-3 months for similar pain, and the negative  workups so far, but this pain is more severe than usual.  I have told  her that he could be seen in the office this morning or go to the  emergency room, however, because of her concern about being able to get  through on the phones I called the back line, [phone], because the  other lines were busy, so that Dr. Debby Bud and/or his nurse or scheduler  can call Andrew Malone about having Andrew Malone evaluated.     Neta Mends. Panosh, MD  Electronically Signed    WKP/MedQ  DD: 04/01/2006  DT: 04/01/2006  Job #: [id number]

## 2010-07-18 NOTE — Assessment & Plan Note (Signed)
[facility]                           GASTROENTEROLOGY OFFICE NOTE   Andrew Malone, Andrew Malone                      MRN:          [id number]  DATE:12/11/2005                            DOB:          February 25, 1941    REASON FOR CONSULTATION:  Vomiting.   HISTORY:  This is a 70 year old white male with a history of hypertension,  gastroesophageal reflux disease, and prior cerebrovascular accident.  He is  referred through the courtesy of Dr. Debby Bud regarding vomiting.  I last saw  the patient in July 2007 when he presented to the endoscopy suite for  surveillance colonoscopy.  Examination revealed diverticulosis only.  Though  he did not mention it at the time, for the past 3-4 months he has had  problems with periodic vomiting.  He reports to me that vomiting can occur  any time, whether it be during the day or even occasionally he will wake at  night.  Each episode of vomiting is preceded, even many hours prior, with a  sensation after meals that food is stuck in his chest.  When he has this  sensation, at some point he will vomit and indeed has food stuff in the  vomitus.  Without the sensation he does not have that symptom.  He has had  no hematemesis or weight loss.  He denies heartburn or indigestion and is  maintained on Prilosec 20 mg b.i.d.  No abdominal pain, fevers, or other  symptoms.  His last upper endoscopy was performed in 1999.  At that time he  had a distal stricture and a hiatal hernia.   CURRENT MEDICATIONS:  1. Plavix 75 mg daily.  2. Clonazepam 0.5 mg p.r.n.  3. Vytorin 10/80 mg daily.  4. Aspirin 325 mg daily.  5. Prilosec 20 mg b.i.d.   He has no known drug allergies.   PHYSICAL EXAMINATION:  GENERAL:  Revealed a well-appearing male in no acute  distress.  VITAL SIGNS:  Blood pressure 120/76, heart rate is 88, weight is 196.8  pounds.  HEENT:  Sclerae are anicteric, conjunctivae are pink. Oral mucosa is intact,  no thrush.  LUNGS:  Clear.  HEART:  Regular.  ABDOMEN:  Soft without tenderness, mass or hernia.  No succussion splash.  Good bowel sounds heard.   IMPRESSION:  Andrew Malone presents today with 3- to 83-month history of  intermittent problems with vomiting.  This occurring after what sounds like  episodes of dysphagia.  Possible causes include partial food impaction,  gastroparesis or even partial outlet obstruction.  However, his symptoms are  very sporadic and can occur several times per week or not at all in a given  week.  In any event, I would like him to continue on his Prilosec and he can be set  up for diagnostic upper endoscopy in the near future.  Further  recommendations to follow that exam.            ______________________________  Andrew Malone. Eda Keys., MD      JNP/MedQ  DD:  12/11/2005  DT:  12/14/2005  Job #:  [id number]  cc:   Rosalyn Gess. Norins, MD

## 2010-07-18 NOTE — Discharge Summary (Signed)
NAME:  Andrew Malone, Andrew Malone NO.:  [id number]   MEDICAL RECORD NO.:  [id number]          PATIENT TYPE:  INP   LOCATION:  4735                         FACILITY:  [facility]   PHYSICIAN:  Olga Millers, M.D. LHCDATE OF BIRTH:  13-Jun-1940   DATE OF ADMISSION:  03/25/2005  DATE OF DISCHARGE:  03/26/2005                                 DISCHARGE SUMMARY   CARDIOLOGIST:  Olga Millers, M.D.   PRIMARY CARE PHYSICIAN:  Rosalyn Gess. Norins, M.D.   PRINCIPAL DIAGNOSIS:  Chest pain.   SECONDARY DIAGNOSES:  1.  Coronary disease status post cardiac catheterization with nonobstructive      coronary artery disease on catheterization and normal left ventricular      function on catheterization in 2000 with questionable myocardial      infarction in 1994.  2.  Diabetes mellitus, Type 2, diet-controlled.  3.  Hypertension.  4.  Peripheral vascular disease with right internal carotid artery stenosis,      60-80% in May 2005.  5.  History of glomerulonephritis.  6.  Cerebrovascular disease.  7.  Skin cancer removed three months ago.  8.  Hernia repair.   PROCEDURE:  1.  Cardiac catheterization March 25, 2005.  2.  Computerized tomographic scan March 26, 2005.   ALLERGIES:  No known drug allergies.   HISTORY OF THE PRESENT ILLNESS:  The patient is a 70 year old male with a  history of nonobstructive coronary artery disease by cath in 2000 with a  previous MI and a history of diabetes mellitus who presents to the hospital  March 25, 2005 with substernal chest pain with radiation into his neck and  associated shortness of breath.  The patient is admitted to [facility].   HOSPITAL COURSE:  The patient was admitted on March 25, 2005 and placed on  the telemetry floor.  A planned cardiac catheterization was done.  On  March 25, 2005 Dr. Eden Emms performed cardiac catheterization, which showed  nonobstructive coronary artery disease, mild aortic root dilatation, normal  EF of 60%  and a right tortuous iliac.  Dr. Eden Emms decided to do a CT scan  and afterwards would discharge the patient to home with risk factor  modification.  The CT scan performed March 26, 2005 was negative for  pulmonary embolus.  It did show some coronary and aortic calcifications.  The patient was seen on March 26, 2005 by Dr. Olga Millers and was  deemed stable to be discharged home.   DISCHARGE MEDICATIONS:  1.  Vytorin 10/80 mg p.o. at bedtime; this is an increase in dosage.  2.  Aspirin 325 p.o. daily.  3.  Multivitamin daily.  4.  Prevacid 30 mg twice a day.  5.  Clonazepam 0.5 mg three times a day p.r.n.   LABORATORY DATA:  Discharge laboratories include:  White blood cell count  6.6, hemoglobin 14.2,  hematocrit 41.7 and platelet count 228,000.  Sodium  136, potassium 4.4, glucose 103, BUN 16, and creatinine 1.0.  Troponins were  negative as well as CK/MBs.   FOLLOW UP:  The patient will be followed up by  Dr. Jens Som on April 20, 2005 at 11:15 A.M.  He will also have an echo done related to murmur on  April 07, 2005 at 10:30 with carotid Doppler study planned on April 07, 2005 at 11:30.  The patient will also have liver and lipid panels done  on May 04, 2005 at 8:30 A.M.     ______________________________  April Humphrey, NP    ______________________________  Olga Millers, M.D. [facility]    AH/MEDQ  D:  03/26/2005  T:  03/27/2005  Job:  [id number]   cc:   Rosalyn Gess. Norins, M.D. [facility]  [address]  [place]  [place] [address]

## 2010-07-18 NOTE — Cardiovascular Report (Signed)
NAME:  Andrew Malone, Andrew Malone NO.:  [id number]   MEDICAL RECORD NO.:  [id number]          PATIENT TYPE:  EMS   LOCATION:  MINO                         FACILITY:  [facility]   PHYSICIAN:  Charlton Haws, M.D.     DATE OF BIRTH:  12/28/1940   DATE OF PROCEDURE:  03/25/2005  DATE OF DISCHARGE:                              CARDIAC CATHETERIZATION   [name] is a 70 year old patient with question of previous history of an  MI in 71 or 42. He had a catheterization in 1995 by myself and another  one in 2000 by Dr. Gerri Spore which did not show critical disease. He had a  Myoview in March 2006 which was normal. He was admitted to the ER with chest  pain relieved with nitroglycerine.   Catheterization was done from the right femoral artery. We needed to use  wire exchanges. Hand injection of the right iliac artery showed severe  tortuosity and mild stenosis.   Left main coronary was normal.   Left anterior descending artery had a 40% proximal lesion. Mid and distal  vessels were normal. There was a very small first diagonal branch that was  less than 1.5 mm that had a 70% ostial lesion.   Circumflex coronary artery was nondominant and normal. There was a large  first obtuse marginal branch which was bifurcating which was normal.   Right coronary artery was dominant but small. There was 30-40% diffuse  tubular disease in the mid vessel.   RAO ventriculography: RAO ventriculography was normal. EF is 60%. There was  no gradient across the aortic valve. No MR. Aortic pressure is 128/63, LV  pressure is 128/12.   The ascending aorta towards the arch was somewhat dilated, but there was no  evidence of dissection.   IMPRESSION:  The patient has no critical coronary disease. The diagonal  branch is quite small, and the ostial lesion should be stable. We will  observe the patient overnight. He will have a chest CT in the morning to  further assess his mediastinum in regards to  causes of chest pain. He does  have an eventrated right hemidiaphragm and some collapsed lung volumes on  the right side.   He will have continued risk factor modification and can follow up with Dr.  Jens Som in the office   He tolerated procedure well.           ______________________________  Charlton Haws, M.D.     PN/MEDQ  D:  03/25/2005  T:  03/25/2005  Job:  [id number]   cc:   Olga Millers, M.D. [facility]  [address]  [address]  [place]  [place] [address]

## 2010-07-18 NOTE — H&P (Signed)
NAME:  Andrew Malone, Andrew Malone NO.:  [id number]   MEDICAL RECORD NO.:  [id number]          PATIENT TYPE:  EMS   LOCATION:  MINO                         FACILITY:  [facility]   PHYSICIAN:  Charlton Haws, M.D.     DATE OF BIRTH:  06-10-1940   DATE OF ADMISSION:  03/25/2005  DATE OF DISCHARGE:                                HISTORY & PHYSICAL   Mr. Slayden is a pleasant 70 year old patient of Dr. Jens Som and Dr.  Debby Bud.  He has a history of previous MI back in 1993 or 1994.  However, he  had a catheterization with non-obstructive coronaries in 2000.  His EF has  been normal at 65%.  He had a stress test in March of 2006 that was normal  with an EF of 65%.   His coronary risk factors include family history of coronary disease.  He  has peripheral vascular disease with a right internal carotid artery  stenosis of 60-80%, apparently he takes Plavix for this and he also has diet  controlled diabetes and hypertension.   He came to the hospital today not feeling quite well the last few days with  substernal chest pain which was relieved with nitroglycerin.  He was pain-  free in the ER on a nitroglycerin drip.   The pain did radiate into his neck.  He had some associated shortness of  breath.  He has no known allergies.   MEDICATIONS:  1.  Plavix 75 mg a day.  2.  Aspirin a day.  3.  Clonazepam 0.5 a day.  4.  Multivitamin.  5.  Vytorin.  6.  Prevacid.   PAST MEDICAL HISTORY:  1.  Hernia repair.  2.  Hypertension.  3.  Diabetes.  4.  60-80% right internal carotid artery stenosis.  5.  History of glomerulonephritis.  6.  Skin cancer removal three months ago.   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  He is married.  His wife  was with him.  They have one child.  He quit smoking years ago.  He does  walk and uses a diabetic diet.   PHYSICAL EXAMINATION:  VITAL SIGNS:  Blood pressure 130/70, pulse 70 and  regular.  LUNGS:  Clear.  Carotids are normal.  NECK:  I  do not hear a bruit despite the 60-80% reported stenosis on the  right.  HEART:  There is an S1, S2 with soft systolic murmur.  ABDOMEN:  Benign.  EXTREMITIES:  Lower extremities with equal pulses.  No edema.   EKG shows J-point elevation and Q-waves in 1, aVL, V5, and V6.  There are no  acute changes.  Initial enzymes are negative.  His chest x-ray shows  eventration of the right hemidiaphragm with no cardiomegaly.   IMPRESSION:  Documented vascular disease with question of a distant  myocardial infarction and internal carotid artery stenosis.  Chest pain  relieved with nitroglycerin.  Recommended heart catheterization to the  patient.  H agrees.  The risks were discussed including stroke, need for  emergency surgery, bleeding, contrast allergy.  He is willing  to proceed.  He did not have any problems with his last catheterization.  Patient is on  IV nitroglycerin and heparin in the ER.  He is already on aspirin and  Plavix.   Further recommendations will be based on the results of his heart  catheterization.   If his heart catheterization does not show obstructive coronary disease I  think it would be reasonable to check a CT of his chest in the morning to  rule out any pulmonary embolism or pleuritic process, particularly since the  right lung is somewhat compressed from the eventration of the diaphragm.   I will continue his Prevacid for his gastroesophageal reflux disease.           ______________________________  Charlton Haws, M.D.     PN/MEDQ  D:  03/25/2005  T:  03/25/2005  Job:  [id number]

## 2010-07-18 NOTE — Assessment & Plan Note (Signed)
[facility]                                   ON-CALL NOTE   [name], Andrew Malone                      MRN:          [id number]  DATE:10/25/2005                            DOB:          1940/12/26    PHONE NUMBER:  [phone].   TIME SEEN:  October 25, 2005 at 2:20 p.m.   Caller is his wife although I spoke to both of them.   CHIEF COMPLAINT:  Itching.  Patient is being treated for a bee sting/insect  bite on his hand, thus itching is too severe for him to tolerate.  He is  taking 50 mg of Benadryl every 4-6 hours.  Says that the swelling has gone  down but it is still red and warm to the touch.  There are no streaks or  increased in redness.  He is currently taking Benadryl and Augmentin.  His  wife wanted to know what we could do next.  I went ahead and gave the okay  to dose him with prednisone 20 mg today, 20 mg Monday and 20 mg Tuesday with  instructions to follow up with his regular doctor, Dr. Debby Bud, early this  week to recheck it.  We discussed watching for signs and symptoms of  increased redness, fever or any symptoms of infection and if his condition  worsens in any way, he will go to the emergency room for evaluation.  Of  note, his wife is on prednisone and had the prednisone at home so I did not  need to call it in.  Again, he will take 20 mg Sunday, Monday and Tuesday  and follow up in the office Monday or Tuesday.                                   Marne A. Tower, MD   MAT/MedQ  DD:  10/25/2005  DT:  10/26/2005  Job #:  [id number]   cc:   Rosalyn Gess. Norins, MD

## 2010-07-18 NOTE — Assessment & Plan Note (Signed)
[facility]                            CARDIOLOGY OFFICE NOTE   Andrew Malone, Andrew Malone                      MRN:          [id number]  DATE:03/22/2006                            DOB:          [birth year]    Andrew Malone returns for followup today.  He is a gentleman who has a  history of nonobstructive coronary disease and cerebrovascular disease.  His last catheterization in January, 2007 showed nonobstructive disease,  mild aortic insufficiency, and normal ejection fraction.  Since I last  saw him, he has dyspnea with more extreme exertion but not with routine  activities.  There is no orthopnea, PND, pedal edema, palpitations,  presyncope or syncope.  He does occasionally have a pain in his chest  that lasts for seconds.  He does not have exertional chest pain.  His  wife is concerned about significant fatigue.  Noted, TSH was recently  checked in October, which was normal.  Also, he is now being evaluated  by Dr. Sherene Sires for right-sided back pain.   His medications include Vytorin 10/80 nightly, Plavix 75 mg p.o. daily,  aspirin 325 mg p.o. daily, Prilosec 20 mg p.o. daily.  He takes Klonopin  as needed.   PHYSICAL EXAMINATION:  VITAL SIGNS:  His physical exam today shows a  blood pressure of 158/77.  His pulse is 71.  NECK:  Supple.  CHEST:  Clear.  CARDIOVASCULAR:  Regular rate and rhythm.  EXTREMITIES:  No edema.   His electrocardiogram shows a sinus rhythm at a rate of 71.  There were  no ST changes noted.   DIAGNOSES:  1. Cerebrovascular disease.  2. History of nonobstructive disease by previous catheterization.  3. Hyperlipidemia.  4. Hypertension.  5. History of glomerular nephritis.   PLAN:  Andrew Malone is doing well from a symptomatic standpoint, and his  previous catheterization revealed nonobstructive coronary artery  disease.  He had follow-up carotid Dopplers today, and we will ask for  those results to be forwarded to [place] for our  records.  His most lipids  and liver were outstanding.  His blood pressure is mildly elevated  today, but he states that it typically runs in the 120 range.  I have  asked him to track this, and if indicated, we will add an  antihypertensive.  I will see him back in one year.     Andrew Frieze Jens Som, MD, [facility]  Electronically Signed   BSC/MedQ  DD: 03/22/2006  DT: 03/22/2006  Job #: [id number]

## 2010-07-18 NOTE — Assessment & Plan Note (Signed)
[facility]                             PULMONARY OFFICE NOTE   Andrew Malone, Andrew Malone                      MRN:          [id number]  DATE:03/17/2006                            DOB:          1940-06-18    REASON FOR CONSULTATION:  Persistent cough.   CHIEF COMPLAINT:  Chest pain.   HISTORY:  This is a 70 year old white male who came in aggravated today  that we don't know why he is here.  The record indicates he had been a  remote pipe smoker and also had a history of asbestos exposure and has  been coughing his entire life.  By this he means several times of the  day, mostly dry in nature and not exacerbated at night.   However, the focus of the patient's concern is new-onset right-sided  back pain that radiates up to the top of his shoulder posteriorly.  It  is present 24 hours a day but better when he lies down at night and is  not pleuritic in nature.  Although one of the notes previously says  hurts when breathes, the patient vehemently denies this and states  you guys are always confused about what I am trying to tell you.  However, he also has been intermittent sneezing and denied to me any  pain with either taking a deep breath, sneezing or coughing.  He denies  any unintended weight loss or significant change is the chronic cough  since the onset of the pain, fevers, chills, sweats, orthopnea, PND, or  leg swelling.   PAST MEDICAL HISTORY:  Significant for GERD for which he underwent an  upper endoscopy December 16, 2005, showing hiatal hernia.  He also has a  history of hypertension, glomerulonephritis, plantar fasciitis, and  atypical chest pain with a negative heart catheterization in 1999.  He  has been followed by psychiatry before, according to the notes.   ALLERGIES:  None known.   MEDICATIONS:  1. Prilosec twice daily but the second dose is taken at bedtime.  2. Aspirin one daily.  3. Vytorin 10/80 one daily.  4. Plavix 75 mg  daily.   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   FAMILY HISTORY:  Is recorded in detail on the worksheet and negative for  respiratory diseases or rheumatologic disease.   REVIEW OF SYSTEMS:  Taken in detail on the worksheet and negative except  as outlined above.   PHYSICAL EXAMINATION:  GENERAL:  This is an intermittently angry,  ambulatory, obese white male in no acute distress.  He is afebrile with  normal vital signs.  HEENT:  Unremarkable, pharynx clear.  Dentition intact.  Nasal  turbinates normal.  Ear canals clear bilaterally.  NECK:  Supple without cervical adenopathy or tenderness.  Trachea is  midline, no thyromegaly.  LUNG FIELDS:  Perfectly clear bilaterally to auscultation and  percussion.  There is no rub appreciated.  There is no tenderness on  fist percussion over the  spine or chest wall.  HEART:  Regular rate and rhythm without murmur, gallop, rub or increase  in P2.  ABDOMEN:  Obese, benign.  EXTREMITIES:  Warm without calf tenderness, cyanosis, clubbing, or  edema.   Chest x-ray was reviewed from February 10, 2006, shows suspected  eventration of the right anterior hemidiaphragm, unchanged since 2005.   IMPRESSION:  1. Cough all his life in the setting of having documented hiatal      hernia probably represents low-grade reflux and was given      information on optimal diet and administration of omeprazole.  He      says, however, that is not really why he is here and the cough      doesn't really bother him.  2. Posterior right chest discomfort with no pleuritic features.  It      has been present for over 3 months and he denies any exacerbating      or alleviating factors other than it feels better when he lies      down.  This probably is musculoskeletal but has not responded well      to nonsteroidals.  I recommended a CT scan of  the chest as well as      a sed rate, CBC, chemistry profile and urinalysis.  However, when      he went to the scheduler and found that I had written down chest      pain on the reason for the CT scan, he stormed out of the office,      stating I am not having chest pain and that's not the first time      this practice didn't get things right.  (I believe he is referring      there to the issue of the reason he was referred was for the cough      and not for the pain.)   I left the patient a message on the phone explaining that indeed, the  area where he is having the pain is part of his right chest posteriorly,  and that a CT scan of the chest would incorporate the entire area of his  pain and I would strongly recommend that he have one since his pain has  been present for 3 months and unexplained, especially since he has been  exposed to asbestos in the past.  I doubt, based on how angry he became  during the visit, that he will return, however.  I will defer to Dr.  Debby Bud therefore whether to continue any further workup.     Charlaine Dalton. Sherene Sires, MD, [facility]  Electronically Signed    MBW/MedQ  DD: 03/17/2006  DT: 03/17/2006  Job #: [id number]   cc:   Rosalyn Gess. Norins, MD

## 2010-07-18 NOTE — Assessment & Plan Note (Signed)
[facility]                                   ON-CALL NOTE   NIRVAN, LABAN                      MRN:          [id number]  DATE:10/25/2005                            DOB:          1940-10-31    TIME:  8:54 a.m.   TELEPHONE NUMBER:  [phone]   PHYSICAL EXAMINATION:  Almedia Balls. Ranell Patrick, M.D.   CALLER:  Steward Drone, his wife.   CHIEF COMPLAINT:  He is having itching.   HISTORY:  I saw him Saturday clinic yesterday.  He has had an insect bite on  his hand which has become infected.  He is on Benadryl and also Augmentin.  She said this morning that his swelling is somewhat better.  His redness is  not any worse.  He is not in pain but itching is more severe. She wants to  know what they can do.  I told her to go ahead and dose Benadryl 25 to 50  every four to six hours and to expect sedation with that, to keep it as cool  as possible and elevate it to help with the itch.  We discussed the  possibility of putting him on prednisone and we want to avoid that unless we  absolutely have to because of further infection. They will call back if the  symptoms worsen or if anything changes.                                   Marne A. Tower, MD   MAT/MedQ  DD:  10/25/2005  DT:  10/26/2005  Job #:  [id number]   cc:   Rosalyn Gess. Norins, MD

## 2010-07-31 ENCOUNTER — Telehealth: Payer: Self-pay | Admitting: *Deleted

## 2010-07-31 DIAGNOSIS — R5381 Other malaise: Secondary | ICD-10-CM

## 2010-07-31 DIAGNOSIS — G4710 Hypersomnia, unspecified: Secondary | ICD-10-CM

## 2010-07-31 NOTE — Telephone Encounter (Signed)
See below, please put in referral for sleep study referral, Thanks

## 2010-07-31 NOTE — Telephone Encounter (Signed)
Pt informed. No sleep study has been done prior

## 2010-07-31 NOTE — Telephone Encounter (Signed)
Labs- B12, TSH, FT4 - 780.79 Tests- if he has not had a sleep study he will need one to evaluate his chronic fatigue and poor resting

## 2010-07-31 NOTE — Telephone Encounter (Signed)
Wife called concerned - Pt continues to have problems sleeping. She feels that he is "not worried about anything" and when he does rest he wakes up exhausted. Pt does not want to come in again for this issue unless MD insists. They want to know if MD would order labs? Could hormones be checked? Please advise.

## 2010-08-01 ENCOUNTER — Other Ambulatory Visit (INDEPENDENT_AMBULATORY_CARE_PROVIDER_SITE_OTHER): Payer: Medicare Other

## 2010-08-01 DIAGNOSIS — R5383 Other fatigue: Secondary | ICD-10-CM

## 2010-08-01 DIAGNOSIS — R5381 Other malaise: Secondary | ICD-10-CM

## 2010-08-01 DIAGNOSIS — Z79899 Other long term (current) drug therapy: Secondary | ICD-10-CM

## 2010-08-01 LAB — T4, FREE: Free T4: 0.93 ng/dL (ref 0.60–1.60)

## 2010-08-01 NOTE — Telephone Encounter (Signed)
Entered order for sleep study. Please check with [facility] to be sure it went to them

## 2010-08-01 NOTE — Telephone Encounter (Signed)
Printed requisition and given to [facility] to f/u Monday

## 2010-08-05 ENCOUNTER — Telehealth: Payer: Self-pay | Admitting: *Deleted

## 2010-08-05 NOTE — Telephone Encounter (Signed)
Patient requesting results of labs.  

## 2010-08-06 MED ORDER — CYANOCOBALAMIN 500 MCG/0.1ML NA SOLN: 1000.0000 ug | NASAL | Status: DC

## 2010-08-06 MED ORDER — CYANOCOBALAMIN 500 MCG/0.1ML NA SOLN: 500.0000 ug | NASAL | Status: DC

## 2010-08-06 NOTE — Telephone Encounter (Signed)
June 1 labs: thyroid is normal. B12 is low at 203 - needs replacement: monthly injections B12 or nascobal 1 spray/nares once a week. ($$$$)  Thanks

## 2010-08-07 ENCOUNTER — Telehealth: Payer: Self-pay | Admitting: *Deleted

## 2010-08-07 NOTE — Telephone Encounter (Signed)
Needs B-12 injections, b/c nascobal is too expensive. Transferred to scheduler to set up monthly b-12 injections.

## 2010-08-11 ENCOUNTER — Ambulatory Visit (INDEPENDENT_AMBULATORY_CARE_PROVIDER_SITE_OTHER): Payer: Medicare Other | Admitting: *Deleted

## 2010-08-11 DIAGNOSIS — E538 Deficiency of other specified B group vitamins: Secondary | ICD-10-CM

## 2010-08-11 MED ORDER — CYANOCOBALAMIN 1000 MCG/ML IJ SOLN
1000.0000 ug | Freq: Once | INTRAMUSCULAR | Status: AC
  Administered 2010-08-11: 1000 ug via INTRAMUSCULAR

## 2010-08-14 ENCOUNTER — Telehealth: Payer: Self-pay | Admitting: *Deleted

## 2010-08-14 NOTE — Telephone Encounter (Signed)
Patient requesting results of sleep study, have you seen these?

## 2010-08-14 NOTE — Telephone Encounter (Signed)
Looked everywhere I know to look in epic and cannot find a report

## 2010-08-18 NOTE — Telephone Encounter (Signed)
Will order paper chart to see

## 2010-08-22 NOTE — Telephone Encounter (Signed)
Pt was inquiring about referral [not results] Pt's wife has been informed & pt is aware of appointment details.

## 2010-09-14 ENCOUNTER — Ambulatory Visit (HOSPITAL_BASED_OUTPATIENT_CLINIC_OR_DEPARTMENT_OTHER): Payer: Medicare Other | Attending: Internal Medicine

## 2010-09-14 DIAGNOSIS — G4733 Obstructive sleep apnea (adult) (pediatric): Secondary | ICD-10-CM | POA: Insufficient documentation

## 2010-09-15 ENCOUNTER — Ambulatory Visit (INDEPENDENT_AMBULATORY_CARE_PROVIDER_SITE_OTHER): Payer: Medicare Other | Admitting: *Deleted

## 2010-09-15 DIAGNOSIS — R5383 Other fatigue: Secondary | ICD-10-CM

## 2010-09-15 MED ORDER — CYANOCOBALAMIN 1000 MCG/ML IJ SOLN
1000.0000 ug | Freq: Once | INTRAMUSCULAR | Status: AC
  Administered 2010-09-15: 1000 ug via INTRAMUSCULAR

## 2010-09-20 DIAGNOSIS — G4733 Obstructive sleep apnea (adult) (pediatric): Secondary | ICD-10-CM

## 2010-09-21 NOTE — Procedures (Signed)
NAME:  Andrew Malone, FACE NO.:  [id number]  MEDICAL RECORD NO.:  [id number]          PATIENT TYPE:  OUT  LOCATION:  SLEEP CENTER                 FACILITY:  [facility]  PHYSICIAN:  Barbaraann Share, MD,FCCPDATE OF BIRTH:  08/21/42  DATE OF STUDY:  09/14/2010                           NOCTURNAL POLYSOMNOGRAM  REFERRING PHYSICIAN:  Rosalyn Gess. Norins, MD  INDICATIONS FOR THE STUDY:  Hypersomnia with sleep apnea.  EPWORTH SCORE:  2.  SLEEP ARCHITECTURE:  The patient had a total sleep time of 344 minutes with very little slow wave sleep and only 86 minutes of REM.  Sleep onset latency was normal at 12 minutes and REM onset was normal at 107 minutes.  Sleep efficiency was mildly reduced to 86%.  RESPIRATORY DATA:  The patient was found to have one apnea and 47 obstructive hypopneas, giving him an apnea-hypopnea index of eight events per hour.  The events occurred in all body positions, and there was moderate snoring noted throughout.  OXYGEN DATA:  There was O2 desaturation as low as 84% with the patient's obstructive events.  CARDIAC DATA:  Occasional PVC noted, but no clinically significant arrhythmias were seen.  MOVEMENT-PARASOMNIA:  The patient had no significant leg jerks or other abnormal behaviors noted.  IMPRESSION-RECOMMENDATIONS: 1. Mild obstructive sleep apnea/hypopnea syndrome with an apnea-     hypopnea index of eight events per hour and O2 desaturation as low     as 84%.  Treatment for this degree of sleep apnea can include a     trial of weight loss alone, upper airway surgery, dental appliance,     and also CPAP.  This degree of sleep apnea has very little impact     on cardiovascular health, and therefore the decision to treat this     aggressively should depend upon its impact to the patient's quality     of life.  Clinical correlation is suggested. 2. Occasional premature ventricular contraction noted, but no     clinically significant arrhythmias  were seen.    Barbaraann Share, MD,FCCP Diplomate, American Board of Sleep Medicine Electronically Signed   [facility]/MEDQ  D:  09/20/2010 17:14:30  T:  09/21/2010 03:38:45  Job:  [id number]

## 2010-09-23 ENCOUNTER — Telehealth: Payer: Self-pay | Admitting: *Deleted

## 2010-09-23 NOTE — Telephone Encounter (Signed)
Pt is requesting a strong medication for his back pain. Pts wife states he cannot sleep due to the back pain. Pt would also like results of his sleep study. Please advise in Dr Debby Bud absence.

## 2010-09-23 NOTE — Telephone Encounter (Signed)
He needs OV, please. Thx

## 2010-09-24 NOTE — Telephone Encounter (Signed)
Ov with Dr Debby Bud in the morning

## 2010-09-25 ENCOUNTER — Ambulatory Visit
Admission: RE | Admit: 2010-09-25 | Discharge: 2010-09-25 | Disposition: A | Discharge status: Home / Self Care | Payer: Medicare Other | Source: Ambulatory Visit | Attending: Internal Medicine | Admitting: Internal Medicine

## 2010-09-25 ENCOUNTER — Ambulatory Visit (INDEPENDENT_AMBULATORY_CARE_PROVIDER_SITE_OTHER): Payer: Medicare Other | Admitting: Internal Medicine

## 2010-09-25 ENCOUNTER — Ambulatory Visit (INDEPENDENT_AMBULATORY_CARE_PROVIDER_SITE_OTHER)
Admission: RE | Admit: 2010-09-25 | Discharge: 2010-09-25 | Disposition: A | Discharge status: Home / Self Care | Payer: Medicare Other | Source: Ambulatory Visit | Attending: Internal Medicine | Admitting: Internal Medicine

## 2010-09-25 ENCOUNTER — Other Ambulatory Visit: Payer: Self-pay | Admitting: Internal Medicine

## 2010-09-25 VITALS — BP 150/82 | HR 69 | Temp 97.5°F | Wt 191.0 lb

## 2010-09-25 DIAGNOSIS — M546 Pain in thoracic spine: Secondary | ICD-10-CM

## 2010-09-25 MED ORDER — CLONAZEPAM 0.5 MG PO TABS: 0.5000 mg | ORAL_TABLET | Freq: Three times a day (TID) | ORAL | Status: DC | PRN

## 2010-09-25 MED ORDER — OMEPRAZOLE 40 MG PO CPDR: 40.0000 mg | DELAYED_RELEASE_CAPSULE | Freq: Every day | ORAL | Status: DC

## 2010-09-25 MED ORDER — METHOCARBAMOL 500 MG PO TABS: 500.0000 mg | ORAL_TABLET | Freq: Three times a day (TID) | ORAL | Status: AC

## 2010-09-25 NOTE — Patient Instructions (Signed)
Thoracic back pain - we know you have arthritic changes and some degenerative disk disease. Plan - repeat plain x-rays and if there is any change will move ahead with repeat MRI. Continue to take the meloxicam as tolerated and will add the robaxin.  \Leg cramps - try ginko for a week. If this doesn't help call me back and a Rx for gabapentin 100mg  at bedtime will be called in.   Continue all your other medications.

## 2010-09-25 NOTE — Assessment & Plan Note (Addendum)
Patient with continued and progressive thoracic back pain with review of films from '08. Exam is without radicular findings.  Plan - continue meloxicam           Add methocarbamol           Thoracic back films, if changed from previous study will repeat MRI.   Addendum: *RADIOLOGY REPORT*  Clinical Data: Progressive thoracic back pain.  THORACIC SPINE - 2 VIEW  Comparison: 07/27/2008. MRI 08/04/2008.  Findings: There is slight narrowing of some of the intervertebral  disc spaces. Osteophytes are seen at multiple levels unchanged  representing degenerative spondylosis. No fracture or bony  destruction is seen. Alignment appears normal.  IMPRESSION:  Changes of thoracic degenerative spondylosis and degenerative disc  disease. This appears stable compared to prior study. No fracture  or other lesion is evident.  Original Report Authenticated By: Crawford Givens, M.D.  Plan - with no change in the appearance of the thoracic spine will not pursue further studies.

## 2010-09-25 NOTE — Progress Notes (Signed)
  Subjective:    [name]: Andrew Malone, male    DOB: [birth year], 70 y.o.   MRN: [id number]  HPI Andrew Malone presents with increasing pain in the righrt thoracic back over the past several weeks to the point where he has not been able to work. Reviewed chart: thoracic spine films '08 - with DJD; MRI thoracic spine June '08 with DJD, mild DDD with osteophyte at C5-6; MRI c-spine June '08 with mild DJD, DDD with osteophyte with severe foraminal narrowing C5-6 left. He describes the pain as radiating through to the anterior chest. He has not had any paresthesia in the right arm. He has had mild paresthesia intermittently on the left hand. Any bump  Or strain can exacerbate the pain. He does get some relief with NSAID but cannot take this daily due to stomach upset. He gets some relief with ice-pak, heat does not help. He does report SOB at rest and he has had night sweats 2-3 times a month.   He is also having nightly leg cramps.    PMH, FamHx and SocHx reviewed for any changes and relevance.    Review of Systems Review of Systems  Constitutional:  positive for chills and night sweats several times a month, activity change.   HEENT:  Negative for hearing loss, ear pain, congestion, neck stiffness and postnasal drip. Negative for sore throat or swallowing problems. Negative for dental complaints.   Eyes: Negative for vision loss or change in visual acuity.  Respiratory: Negative for chest tightness and wheezing. He has had SOB   Cardiovascular: Negative for chest pain and palpitation. No decreased exercise tolerance Gastrointestinal: No change in bowel habit. No bloating or gas. No reflux or indigestion Genitourinary: Negative for urgency, frequency, flank pain and difficulty urinating.  Musculoskeletal: Positive  for myalgias, back pain. Neurological: Negative for dizziness, tremors, weakness and headaches.  Hematological: Negative for adenopathy.  Psychiatric/Behavioral: Negative for  behavioral problems and dysphoric mood.       Objective:   Physical Exam Vitals noted. Gen'l- WNWD white male in no acute distress PUlmonary - no increased WOB Cor - RRR Back - normal ROM UE, normal strength grip and proximally, no CVAT tenderness,       Assessment & Plan:

## 2010-09-26 ENCOUNTER — Telehealth: Payer: Self-pay | Admitting: *Deleted

## 2010-09-26 DIAGNOSIS — G4730 Sleep apnea, unspecified: Secondary | ICD-10-CM

## 2010-09-26 DIAGNOSIS — G4733 Obstructive sleep apnea (adult) (pediatric): Secondary | ICD-10-CM | POA: Insufficient documentation

## 2010-09-26 HISTORY — DX: Sleep apnea, unspecified: G47.30

## 2010-09-26 MED ORDER — MELOXICAM 7.5 MG PO TABS: 7.5000 mg | ORAL_TABLET | Freq: Every day | ORAL | Status: DC

## 2010-09-26 NOTE — Telephone Encounter (Signed)
Called pt:1. Will set up sleep consult 2) thoracic spine films without any change from '08 3) will renew meloxicam

## 2010-09-26 NOTE — Telephone Encounter (Signed)
Pt had OV yesterday - 1. Wants sleep study results 2. Needs RF meloxicam.

## 2010-09-29 ENCOUNTER — Telehealth: Payer: Self-pay

## 2010-09-29 NOTE — Telephone Encounter (Signed)
Patient wife called stating that her husband was seen last week and needs an out of work note to cover 09/23/10-09/28/10. Patient will pick up this afternoon.   Letter completed and placed upfront for pick up

## 2010-10-04 ENCOUNTER — Encounter: Payer: Self-pay | Admitting: Internal Medicine

## 2010-10-08 ENCOUNTER — Institutional Professional Consult (permissible substitution): Payer: Medicare Other | Admitting: Pulmonary Disease

## 2010-10-17 ENCOUNTER — Encounter: Payer: Self-pay | Admitting: Pulmonary Disease

## 2010-10-17 ENCOUNTER — Ambulatory Visit (INDEPENDENT_AMBULATORY_CARE_PROVIDER_SITE_OTHER): Payer: Medicare Other | Admitting: Pulmonary Disease

## 2010-10-17 DIAGNOSIS — G4730 Sleep apnea, unspecified: Secondary | ICD-10-CM

## 2010-10-17 DIAGNOSIS — G4733 Obstructive sleep apnea (adult) (pediatric): Secondary | ICD-10-CM

## 2010-10-17 DIAGNOSIS — G4762 Sleep related leg cramps: Secondary | ICD-10-CM | POA: Insufficient documentation

## 2010-10-17 NOTE — Patient Instructions (Signed)
Will arrange for CPAP set up at home Follow up in 2 months 

## 2010-10-17 NOTE — Assessment & Plan Note (Signed)
Will re-assess after his sleep apnea is controlled.

## 2010-10-17 NOTE — Assessment & Plan Note (Addendum)
He has mild sleep apnea.  He has coronary artery disease, hypertension, and depression.  I have reviewed his sleep test results with the patient.  Explained how sleep apnea can affect the patient's health.  Driving precautions and importance of weight loss were discussed.  Treatment options for sleep apnea were reviewed.  Will arrange for auto CPAP.

## 2010-10-17 NOTE — Progress Notes (Signed)
Subjective:    [name]: Andrew Malone, male    DOB: [birth year], 70 y.o.   MRN: [id number]  HPI 70 yo male for evaluation of sleep apnea.  He had PSG 09/14/10>>AHI 8, SpO2 low 84%; mild sleep apnea.  He has noticed problems with his sleep for years.  He snores, especially when on his back.  His wife says he breathing gets shallow.    He goes to bed at 9 pm.  He falls asleep quickly.  He wakes up several times per night.  He gets frequent leg cramps, and has difficulty with back pain.  He can sometimes have trouble falling back to sleep.  He gets out of bed at 5 am.  He feels tired in the morning, but denies headaches.  He has been using OTC sleep aide, but this has not helped.  He has also been using klonopin for anxiety.  The patient denies sleep walking, sleep talking, bruxism, or nightmares.  There is no history of restless legs.  The patient denies sleep hallucinations, sleep paralysis, or cataplexy.  He does not smoke or drink alcohol.  His weight has been steady.  Epworth score is 2 out of 24.  Past Medical History  Diagnosis Date  . ANXIETY 12/27/2009  . BACK PAIN, THORACIC REGION, RIGHT 07/27/2008  . CAD 12/30/2009  . CAROTID ARTERY DISEASE 11/26/2009  . CHEST PAIN, ATYPICAL, HX OF 12/11/2006  . CVA 12/11/2006  . DEPRESSION 12/27/2009  . DYSPNEA 12/30/2009  . FLANK PAIN, LEFT 12/09/2007  . GERD 12/11/2006  . GLOMERULONEPHRITIS 12/11/2006  . HERPES LABIALIS 12/11/2006  . HIATAL HERNIA, HX OF 12/11/2006  . HYPERCHOLESTEROLEMIA 12/27/2009  . HYPERLIPIDEMIA 12/27/2009  . HYPERTENSION 12/11/2006  . MURMUR 12/30/2009  . MUSCLE SPASM 01/18/2007  . PLANTAR FASCIITIS 12/11/2006  . PVD 12/27/2009  . OSA (obstructive sleep apnea) 09/26/2010     Family History  Problem Relation Age of Onset  . Heart attack Mother   . Hypertension Mother   . Hyperlipidemia Mother   . Diabetes Mother   . Coronary artery disease Mother   . Hypertension Sister   . Hyperlipidemia Sister   .  Diabetes Sister   . Diabetes Brother     severe  . Emphysema Brother   . Lung cancer Sister   . Lung cancer Father   . Breast cancer Sister      History   Social History  . Marital Status: Married    Spouse Name: N/A    Number of Children: 1  . Years of Education: N/A   Occupational History  . 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 Smoking status: Never Smoker   . Smokeless tobacco: Not on file  . Alcohol Use: Not on file  . Drug Use: Not on file  . Sexually Active: Not on file   Other Topics Concern  . Not on file   Social History Narrative   Married in 1969     No Known Allergies   Review of Systems  HENT: Positive for congestion and trouble swallowing.   Neurological: Positive for headaches.      Objective:   Physical Exam BP 116/76  Pulse 71  Temp(Src) 98.4 F (36.9 C) (Oral)  Ht 5' 10.5" (1.791 m)  Wt 189 lb 14.4 oz (86.138 kg)  BMI 26.86 kg/m2  SpO2 96%  General - Healthy HEENT - Wears glasses, PERRLA, EOMI, No sinus tenderness, MP 2, decreased  AP diameter, no LAN, no thyromegaly Cardiac - s1s2 regular, no murmur, pulses symmetric Chest - no wheeze/rales Abd - soft, nontender Ext - no e/c/c Neuro - normal strength, CN intact Psych - normal mood/behavior     Assessment & Plan:   OSA (obstructive sleep apnea) He has mild sleep apnea.  He has coronary artery disease, hypertension, and depression.  I have reviewed his sleep test results with the patient.  Explained how sleep apnea can affect the patient's health.  Driving precautions and importance of weight loss were discussed.  Treatment options for sleep apnea were reviewed.  Will arrange for auto CPAP.  Sleep related leg cramps Will re-assess after his sleep apnea is controlled.    Updated Medication List Outpatient Encounter Prescriptions as of 10/17/2010  Medication Sig Dispense Refill  . acyclovir (ZOVIRAX) 200 MG capsule Take by mouth 3  (three) times daily as needed.        Marland Kitchen aspirin 81 MG EC tablet Take 81 mg by mouth daily.        . clonazePAM (KLONOPIN) 0.5 MG tablet Take 1 tablet (0.5 mg total) by mouth 3 (three) times daily as needed for anxiety. Take 1 tablet by mouth and every 8 hours as needed  90 tablet  5  . Cyanocobalamin (NASCOBAL) 500 MCG/0.1ML SOLN Place 0.1 mLs (500 mcg total) into the nose once a week.  1 Bottle  1  . hydrochlorothiazide 25 MG tablet Take 25 mg by mouth daily.        Marland Kitchen HYDROcodone-acetaminophen (VICODIN ES) 7.5-750 MG per tablet Take 1 tablet by mouth every 6 (six) hours as needed. For pain       . meloxicam (MOBIC) 7.5 MG tablet Take 7.5 mg by mouth daily as needed.        . methocarbamol (ROBAXIN) 500 MG tablet Take 500 mg by mouth daily as needed.        Marland Kitchen omeprazole (PRILOSEC) 40 MG capsule Take 1 capsule (40 mg total) by mouth daily.  30 capsule  11  . pravastatin (PRAVACHOL) 80 MG tablet Take 80 mg by mouth daily.        Marland Kitchen DISCONTD: atorvastatin (LIPITOR) 80 MG tablet Take 80 mg by mouth daily.        Marland Kitchen DISCONTD: meloxicam (MOBIC) 7.5 MG tablet Take 1 tablet (7.5 mg total) by mouth daily.  30 tablet  11  . DISCONTD: omeprazole (PRILOSEC) 20 MG capsule Take 20 mg by mouth 2 (two) times daily.

## 2010-10-22 ENCOUNTER — Ambulatory Visit (INDEPENDENT_AMBULATORY_CARE_PROVIDER_SITE_OTHER): Payer: Medicare Other | Admitting: *Deleted

## 2010-10-22 DIAGNOSIS — E538 Deficiency of other specified B group vitamins: Secondary | ICD-10-CM

## 2010-10-27 MED ORDER — CYANOCOBALAMIN 1000 MCG/ML IJ SOLN
1000.0000 ug | Freq: Once | INTRAMUSCULAR | Status: AC
  Administered 2010-10-27: 1000 ug via INTRAMUSCULAR

## 2010-10-29 ENCOUNTER — Telehealth: Payer: Self-pay | Admitting: *Deleted

## 2010-10-29 DIAGNOSIS — M546 Pain in thoracic spine: Secondary | ICD-10-CM

## 2010-10-29 NOTE — Telephone Encounter (Signed)
Wife called, back pain has not improved w/meds given. Pt would like to MRI that MD suggested at last OV.

## 2010-10-30 NOTE — Telephone Encounter (Signed)
Per previous note - with no change on the plain x-rays it lowers the probability of finding anything on repeat MRI. However - will be happy to order.

## 2010-10-30 NOTE — Telephone Encounter (Signed)
What I said was " will repeat L-S spine films. If there is a change from previous films will move to MRI. There was no change on the plain films from previous study, therefore no need for MRI. We can refer to NS for another opinion.

## 2010-10-30 NOTE — Telephone Encounter (Signed)
Spoke w/wife - says pt was seen by [facility] neuro. approx a year ago. They suggested injections but also said that they did not know why he was having the pain. Per wife, the injections were discussed at last OV w/Dr Norins who agreed that they did not make sense b/c they were injecting in an area that didn't hurt. The neuro MD also said to pt that she did not feel it would work. Wife is very concerned about pt's ongoing pain and frustration. They are req repeat MRI b/c neuro had also suggested the repeat MRI if pain did not improve.

## 2010-10-31 ENCOUNTER — Telehealth: Payer: Self-pay | Admitting: Pulmonary Disease

## 2010-10-31 DIAGNOSIS — G4733 Obstructive sleep apnea (adult) (pediatric): Secondary | ICD-10-CM

## 2010-10-31 NOTE — Telephone Encounter (Signed)
I spoke with Andrew Malone to see what the hold is. She states bc the sleep study was done 1 1/2 years after the OV with PCP that ordered the sleep study and medicare will not over the cpap machine. [facility] states they will set pt up with cpap if he signs a form stating he will be responsible for the cost of the cpap if medicare denies his claim. Pt does not want to do that b/c the machine cost is $1000. Per [facility] what will need to happen is pt will need an OV stating pt will need a sleep study and pt to have sleep study done and then set pt up on cpap. Wife states she would like to get pt in ASAP due to pt not being able to sleep but only 3-4 hours a night. Per rhonda since pt had a recent OV 10/17/10 with [facility] talking about his sleep problems then we can just do a home sleep study and get it approved that away. I have paged Dr. Craige Cotta and will await call back.  Per Dr. Craige Cotta okay to order home sleep study.   Spoke with pt and made her aware of what was going on. Orde rhas been sent and pcc aware

## 2010-11-05 NOTE — Telephone Encounter (Signed)
Pt's spouse called requesting a call back regarding possible further studies on this patient

## 2010-11-07 ENCOUNTER — Other Ambulatory Visit: Payer: Self-pay | Admitting: Internal Medicine

## 2010-11-07 DIAGNOSIS — M546 Pain in thoracic spine: Secondary | ICD-10-CM

## 2010-11-10 ENCOUNTER — Ambulatory Visit (INDEPENDENT_AMBULATORY_CARE_PROVIDER_SITE_OTHER): Payer: Medicare Other | Admitting: Internal Medicine

## 2010-11-10 DIAGNOSIS — G4733 Obstructive sleep apnea (adult) (pediatric): Secondary | ICD-10-CM

## 2010-11-13 NOTE — Telephone Encounter (Signed)
Pt's wife is aware that MRI was going to be ordered. Signing phone note, MRI order was pending, signed.

## 2010-11-19 ENCOUNTER — Telehealth: Payer: Self-pay | Admitting: Pulmonary Disease

## 2010-11-19 NOTE — Telephone Encounter (Signed)
I spoke with pt wife and she states pt received his cpap machine on Monday from [facility]. Pt wife states since pt started using the machine pt feels like his "chest is going to burst open". Wife states pt feels like he can't get a good breath. Pt is using the cpap only 4 hrs a night. Wife is concerned about pt. Pt thinks it's not the machine that it is him. Pt see Dr. Craige Cotta for sleep. Pt wife states Dr. Craige Cotta gave pt an inhaler at last OV to help with his breathing but is not sure of the name of it.  Pt thinks pt may need to be put on a different inhaler to help him. Please advise Dr. Craige Cotta. Thanks  Carver Fila, CMA

## 2010-11-20 NOTE — Telephone Encounter (Signed)
Spoke with [name].  He states that he has been getting problems with his breathing.  He feels like he can't take a deep breath at times.  This happens during the day, and he does not think it is related to using CPAP.  He has not noticed problems with bloating or belching so aerophagia from CPAP seems unlikely.  He has noticed this happen previously.  Otherwise, his wife states that he has been sleeping better with CPAP and no longer snoring while wearing CPAP.  He has been using CPAP for about 4 hours per night, and feels like he is getting more used to sleeping with device.  Advised him that it is unlikely that his current symptoms are related to CPAP use.  Advised him to discuss with Dr. Debby Bud first, and can have further evaluation with pulmonary if needed.  Will forward message to Dr. Debby Bud for his review.

## 2010-11-21 ENCOUNTER — Ambulatory Visit
Admission: RE | Admit: 2010-11-21 | Discharge: 2010-11-21 | Disposition: A | Discharge status: Home / Self Care | Payer: Medicare Other | Source: Ambulatory Visit | Attending: Internal Medicine | Admitting: Internal Medicine

## 2010-11-21 ENCOUNTER — Ambulatory Visit: Payer: Medicare Other | Admitting: Pulmonary Disease

## 2010-11-21 DIAGNOSIS — M546 Pain in thoracic spine: Secondary | ICD-10-CM

## 2010-11-28 ENCOUNTER — Encounter: Payer: Self-pay | Admitting: Internal Medicine

## 2010-12-03 ENCOUNTER — Ambulatory Visit: Payer: Medicare Other

## 2010-12-04 ENCOUNTER — Ambulatory Visit (INDEPENDENT_AMBULATORY_CARE_PROVIDER_SITE_OTHER): Payer: Medicare Other | Admitting: *Deleted

## 2010-12-04 ENCOUNTER — Other Ambulatory Visit: Payer: Self-pay | Admitting: Internal Medicine

## 2010-12-04 DIAGNOSIS — D510 Vitamin B12 deficiency anemia due to intrinsic factor deficiency: Secondary | ICD-10-CM

## 2010-12-04 MED ORDER — CYANOCOBALAMIN 1000 MCG/ML IJ SOLN
1000.0000 ug | Freq: Once | INTRAMUSCULAR | Status: AC
  Administered 2010-12-04: 1000 ug via INTRAMUSCULAR

## 2010-12-05 NOTE — Telephone Encounter (Signed)
Ok for refill  x3 

## 2010-12-09 ENCOUNTER — Telehealth: Payer: Self-pay | Admitting: *Deleted

## 2010-12-09 NOTE — Telephone Encounter (Signed)
Refill request on Hydrocodone Acet. 7.5-750 mg SIG one tablet every 6 hours prn for pain.

## 2010-12-09 NOTE — Telephone Encounter (Signed)
Thought I authorized this yesterday. It is ok to refill x 5

## 2010-12-09 NOTE — Telephone Encounter (Signed)
ok 

## 2010-12-10 NOTE — Telephone Encounter (Signed)
It was done sorry for the duplicate request

## 2010-12-12 ENCOUNTER — Other Ambulatory Visit: Payer: Self-pay | Admitting: Cardiology

## 2010-12-12 DIAGNOSIS — I6529 Occlusion and stenosis of unspecified carotid artery: Secondary | ICD-10-CM

## 2010-12-15 ENCOUNTER — Encounter (INDEPENDENT_AMBULATORY_CARE_PROVIDER_SITE_OTHER): Payer: Medicare Other | Admitting: *Deleted

## 2010-12-15 DIAGNOSIS — I6529 Occlusion and stenosis of unspecified carotid artery: Secondary | ICD-10-CM

## 2010-12-16 ENCOUNTER — Telehealth: Payer: Self-pay | Admitting: *Deleted

## 2010-12-16 MED ORDER — ATORVASTATIN CALCIUM 20 MG PO TABS: 20.0000 mg | ORAL_TABLET | Freq: Every day | ORAL | Status: DC

## 2010-12-16 NOTE — Telephone Encounter (Signed)
Ok for lipitor 20 mg daily, #30 or #90 with year's worth of refills

## 2010-12-16 NOTE — Telephone Encounter (Signed)
Pts insurance would not pay for lipitor some months ago. Pts wife request the he be started on Lipitor because she state the insurance will now pay for it.

## 2010-12-17 LAB — CBC
HCT: 41.4
Hemoglobin: 14.1
MCV: 85.8
RBC: 4.82
WBC: 5.3

## 2010-12-17 LAB — DIFFERENTIAL
Basophils Absolute: 0
Eosinophils Absolute: 0.2
Eosinophils Relative: 3
Lymphocytes Relative: 22
Monocytes Absolute: 0.7

## 2010-12-17 LAB — POCT CARDIAC MARKERS
Myoglobin, poc: 64.1
Operator id: 285841

## 2010-12-17 LAB — I-STAT 8, (EC8 V) (CONVERTED LAB)
BUN: 17
Bicarbonate: 22.4
Chloride: 109
HCT: 43
Operator id: 285841
pCO2, Ven: 38.9 — ABNORMAL LOW
pH, Ven: 7.369 — ABNORMAL HIGH

## 2010-12-17 LAB — COMPREHENSIVE METABOLIC PANEL
ALT: 28
AST: 22
Albumin: 3.7
Alkaline Phosphatase: 71
CO2: 23
Chloride: 110
Creatinine, Ser: 1.05
GFR calc Af Amer: >60
Potassium: 4.1
Sodium: 141
Total Bilirubin: 0.8

## 2010-12-17 LAB — POCT I-STAT CREATININE
Creatinine, Ser: 1.1
Operator id: 285841

## 2010-12-17 LAB — CK TOTAL AND CKMB (NOT AT ARMC): Relative Index: 3.1 — ABNORMAL HIGH

## 2010-12-17 LAB — PROTIME-INR: INR: 1

## 2010-12-17 LAB — VITAMIN B12: Vitamin B-12: 345 (ref 211–911)

## 2010-12-19 ENCOUNTER — Encounter: Payer: Self-pay | Admitting: Pulmonary Disease

## 2010-12-29 ENCOUNTER — Telehealth: Payer: Self-pay | Admitting: Pulmonary Disease

## 2010-12-29 ENCOUNTER — Ambulatory Visit (INDEPENDENT_AMBULATORY_CARE_PROVIDER_SITE_OTHER): Payer: Medicare Other | Admitting: Pulmonary Disease

## 2010-12-29 ENCOUNTER — Encounter: Payer: Self-pay | Admitting: Pulmonary Disease

## 2010-12-29 VITALS — BP 122/72 | HR 77 | Temp 98.0°F | Ht 70.5 in | Wt 192.2 lb

## 2010-12-29 DIAGNOSIS — G4762 Sleep related leg cramps: Secondary | ICD-10-CM

## 2010-12-29 DIAGNOSIS — G4733 Obstructive sleep apnea (adult) (pediatric): Secondary | ICD-10-CM

## 2010-12-29 NOTE — Assessment & Plan Note (Signed)
Much improved after treating his sleep apnea.

## 2010-12-29 NOTE — Progress Notes (Signed)
Chief Complaint  Patient presents with  . Follow-up    sleep study results    History of Present Illness: 70 yo male with sleep apnea.  PSG 09/14/10>>AHI 8, SpO2 low 84% Auto CPAP 11/18/10 to 12/17/10>>Used on 27 of 30 nights with average 5 hrs 19 min.  Average AHI 1.3 with 95th percentile pressure 8 cm H2O.  He has been sleeping better, and feeling better.  He does not have leg cramps as much.  He has some air leaking from his mask.  He has a nasal mask with a chin strap.  His mouth gets dry at night.  Past Medical History  Diagnosis Date  . ANXIETY 12/27/2009  . BACK PAIN, THORACIC REGION, RIGHT 07/27/2008  . CAD 12/30/2009  . CAROTID ARTERY DISEASE 11/26/2009  . CHEST PAIN, ATYPICAL, HX OF 12/11/2006  . CVA 12/11/2006  . DEPRESSION 12/27/2009  . DYSPNEA 12/30/2009  . FLANK PAIN, LEFT 12/09/2007  . GERD 12/11/2006  . GLOMERULONEPHRITIS 12/11/2006  . HERPES LABIALIS 12/11/2006  . HIATAL HERNIA, HX OF 12/11/2006  . HYPERCHOLESTEROLEMIA 12/27/2009  . HYPERLIPIDEMIA 12/27/2009  . HYPERTENSION 12/11/2006  . MURMUR 12/30/2009  . MUSCLE SPASM 01/18/2007  . PLANTAR FASCIITIS 12/11/2006  . PVD 12/27/2009  . OSA (obstructive sleep apnea) 09/26/2010    Past Surgical History  Procedure Date  . Inguinal herniorrhapy     Current Outpatient Prescriptions on File Prior to Visit  Medication Sig Dispense Refill  . acyclovir (ZOVIRAX) 200 MG capsule Take by mouth 3 (three) times daily as needed.        Marland Kitchen aspirin 81 MG EC tablet Take 81 mg by mouth daily.        Marland Kitchen atorvastatin (LIPITOR) 20 MG tablet Take 1 tablet (20 mg total) by mouth daily.  90 tablet  3  . clonazePAM (KLONOPIN) 0.5 MG tablet Take 1 tablet (0.5 mg total) by mouth 3 (three) times daily as needed for anxiety. Take 1 tablet by mouth and every 8 hours as needed  90 tablet  5  . hydrochlorothiazide 25 MG tablet Take 25 mg by mouth daily.        Marland Kitchen HYDROcodone-acetaminophen (VICODIN ES) 7.5-750 MG per tablet take 1 tablet  every 6 hours if needed for pain  60 tablet  3  . meloxicam (MOBIC) 7.5 MG tablet Take 7.5 mg by mouth daily as needed.        . methocarbamol (ROBAXIN) 500 MG tablet Take 500 mg by mouth daily as needed.        Marland Kitchen omeprazole (PRILOSEC) 40 MG capsule Take 1 capsule (40 mg total) by mouth daily.  30 capsule  11    No Known Allergies  Physical Exam:  Blood pressure 122/72, pulse 77, temperature 98 F (36.7 C), temperature source Oral, height 5' 10.5" (1.791 m), weight 192 lb 3.2 oz (87.181 kg), SpO2 95.00%.  General - Healthy  HEENT - Wears glasses, PERRLA, EOMI, No sinus tenderness, MP 2, decreased AP diameter, no LAN, no thyromegaly  Cardiac - s1s2 regular, no murmur, pulses symmetric  Chest - no wheeze/rales  Abd - soft, nontender  Ext - no e/c/c  Neuro - normal strength, CN intact  Psych - normal mood/behavior    Assessment/Plan:  OSA (obstructive sleep apnea) He is compliant and reports benefit from therapy.  Will arrange for new mask.  He is to call if he is still having trouble, and then will decide if he needs to have his pressure setting adjusted.  Sleep  related leg cramps Much improved after treating his sleep apnea.     Outpatient Encounter Prescriptions as of 12/29/2010  Medication Sig Dispense Refill  . acyclovir (ZOVIRAX) 200 MG capsule Take by mouth 3 (three) times daily as needed.        Marland Kitchen aspirin 81 MG EC tablet Take 81 mg by mouth daily.        Marland Kitchen atorvastatin (LIPITOR) 20 MG tablet Take 1 tablet (20 mg total) by mouth daily.  90 tablet  3  . clonazePAM (KLONOPIN) 0.5 MG tablet Take 1 tablet (0.5 mg total) by mouth 3 (three) times daily as needed for anxiety. Take 1 tablet by mouth and every 8 hours as needed  90 tablet  5  . hydrochlorothiazide 25 MG tablet Take 25 mg by mouth daily.        Marland Kitchen HYDROcodone-acetaminophen (VICODIN ES) 7.5-750 MG per tablet take 1 tablet every 6 hours if needed for pain  60 tablet  3  . meloxicam (MOBIC) 7.5 MG tablet Take 7.5 mg  by mouth daily as needed.        . methocarbamol (ROBAXIN) 500 MG tablet Take 500 mg by mouth daily as needed.        Marland Kitchen omeprazole (PRILOSEC) 40 MG capsule Take 1 capsule (40 mg total) by mouth daily.  30 capsule  11  . DISCONTD: Cyanocobalamin (NASCOBAL) 500 MCG/0.1ML SOLN Place 0.1 mLs (500 mcg total) into the nose once a week.  1 Bottle  1  . DISCONTD: pravastatin (PRAVACHOL) 80 MG tablet Take 80 mg by mouth daily.          Shatera Rennert 12/29/2010, 10:27 AM

## 2010-12-29 NOTE — Patient Instructions (Signed)
Will arrange for new CPAP mask Follow up in 6 months 

## 2010-12-29 NOTE — Assessment & Plan Note (Signed)
He is compliant and reports benefit from therapy.  Will arrange for new mask.  He is to call if he is still having trouble, and then will decide if he needs to have his pressure setting adjusted.

## 2010-12-29 NOTE — Telephone Encounter (Signed)
Called and spoke with pt's wife. Wife states pt was seen today by VS.  States order was sent to [facility] for mask fit through [facility].  Wife states [facility] informed her that Medicare will not pay for pt to get a new mask.  Wife states pt cannot tolerate current mask- causing mask leaks and dry mouth and difficulty getting a proper fit.  PCCs, can you please help with this.

## 2010-12-30 NOTE — Telephone Encounter (Signed)
Order given to [facility] to get new mask for fit

## 2011-01-19 ENCOUNTER — Encounter: Payer: Self-pay | Admitting: Cardiology

## 2011-01-19 ENCOUNTER — Ambulatory Visit: Payer: Medicare Other

## 2011-01-19 ENCOUNTER — Ambulatory Visit (INDEPENDENT_AMBULATORY_CARE_PROVIDER_SITE_OTHER): Payer: Medicare Other | Admitting: Cardiology

## 2011-01-19 VITALS — BP 122/60 | HR 75 | Ht 70.5 in | Wt 193.8 lb

## 2011-01-19 DIAGNOSIS — I350 Nonrheumatic aortic (valve) stenosis: Secondary | ICD-10-CM | POA: Insufficient documentation

## 2011-01-19 DIAGNOSIS — I2510 Atherosclerotic heart disease of native coronary artery without angina pectoris: Secondary | ICD-10-CM

## 2011-01-19 NOTE — Assessment & Plan Note (Signed)
Very mild on last echocardiogram. Patient will need followup studies in the future.

## 2011-01-19 NOTE — Patient Instructions (Signed)
Your physician wants you to follow-up in: ONE YEAR You will receive a reminder letter in the mail two months in advance. If you don't receive a letter, please call our office to schedule the follow-up appointment.  

## 2011-01-19 NOTE — Assessment & Plan Note (Signed)
Continue aspirin and statin. 

## 2011-01-19 NOTE — Progress Notes (Signed)
HPI: [name] is a very pleasant gentleman who has a history of nonobstructive coronary disease as well as cerebrovascular disease.  Catheterization in January 2007 showed nonobstructive coronary disease, normal LV function with ejection fraction of 60%.  Myoview in November of 2011 showed an ejection fraction of 70% and normal perfusion. Echocardiogram in November of 1011 showed normal LV function, mild LAE and very mild AS with mean gradient of 8 mmHg.  The patient also has cerebrovascular disease.  Carotid Dopplers in Oct 2012 showed a 60-79% right and 40-59% left carotid stenosis. Followup was recommended in six months. Since I last saw him in Oct of 2011, the patient has dyspnea with more extreme activities but not with routine activities. It is relieved with rest. It is not associated with chest pain. There is no orthopnea, PND or pedal edema. There is no syncope or palpitations. There is no exertional chest pain.   Current Outpatient Prescriptions  Medication Sig Dispense Refill  . acyclovir (ZOVIRAX) 200 MG capsule Take by mouth 3 (three) times daily as needed.        Marland Kitchen aspirin 81 MG EC tablet Take 81 mg by mouth daily.        Marland Kitchen atorvastatin (LIPITOR) 20 MG tablet Take 1 tablet (20 mg total) by mouth daily.  90 tablet  3  . clonazePAM (KLONOPIN) 0.5 MG tablet Take 1 tablet (0.5 mg total) by mouth 3 (three) times daily as needed for anxiety. Take 1 tablet by mouth and every 8 hours as needed  90 tablet  5  . hydrochlorothiazide 25 MG tablet Take 25 mg by mouth daily.        Marland Kitchen HYDROcodone-acetaminophen (VICODIN ES) 7.5-750 MG per tablet take 1 tablet every 6 hours if needed for pain  60 tablet  3  . meloxicam (MOBIC) 7.5 MG tablet Take 7.5 mg by mouth daily as needed.        . methocarbamol (ROBAXIN) 500 MG tablet Take 500 mg by mouth daily as needed.        Marland Kitchen omeprazole (PRILOSEC) 40 MG capsule Take 1 capsule (40 mg total) by mouth daily.  30 capsule  11     Past Medical History    Diagnosis Date  . ANXIETY 12/27/2009  . CAD 12/30/2009  . CAROTID ARTERY DISEASE 11/26/2009  . CVA 12/11/2006  . DEPRESSION 12/27/2009  . GERD 12/11/2006  . GLOMERULONEPHRITIS 12/11/2006  . HIATAL HERNIA, HX OF 12/11/2006  . HYPERLIPIDEMIA 12/27/2009  . HYPERTENSION 12/11/2006  . OSA (obstructive sleep apnea) 09/26/2010    Past Surgical History  Procedure Date  . Inguinal herniorrhapy     History   Social History  . Marital Status: Married    Spouse Name: N/A    Number of Children: 1  . Years of Education: N/A   Occupational History  . 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 Smoking status: Never Smoker   . Smokeless tobacco: Former Neurosurgeon  . Alcohol Use: Not on file  . Drug Use: Not on file  . Sexually Active: Not on file   Other Topics Concern  . Not on file   Social History Narrative   Married in 1969    ROS: no fevers or chills, productive cough, hemoptysis, dysphasia, odynophagia, melena, hematochezia, dysuria, hematuria, rash, seizure activity, orthopnea, PND, pedal edema, claudication. Remaining systems are negative.  Physical Exam: Well-developed well-nourished in no acute distress.  Skin is warm and  dry.  HEENT is normal.  Neck is supple. No thyromegaly.  Chest is clear to auscultation with normal expansion.  Cardiovascular exam is regular rate and rhythm. 2/6 systolic ejection murmur. S2 is not diminished. Abdominal exam nontender or distended. No masses palpated. Extremities show no edema. neuro grossly intact  ECG sinus rhythm with occasional PVC. No ST change.

## 2011-01-19 NOTE — Assessment & Plan Note (Signed)
Continue aspirin and statin. Followup carotid Dopplers April 2013.

## 2011-01-19 NOTE — Assessment & Plan Note (Signed)
Blood pressure controlled. Continue present medications. Potassium and renal function monitored by primary care. 

## 2011-01-19 NOTE — Assessment & Plan Note (Signed)
Continue statin. Lipids and liver monitored by primary care. 

## 2011-01-20 ENCOUNTER — Ambulatory Visit (INDEPENDENT_AMBULATORY_CARE_PROVIDER_SITE_OTHER): Payer: Medicare Other | Admitting: *Deleted

## 2011-01-20 DIAGNOSIS — Z23 Encounter for immunization: Secondary | ICD-10-CM

## 2011-01-20 DIAGNOSIS — E538 Deficiency of other specified B group vitamins: Secondary | ICD-10-CM

## 2011-01-20 MED ORDER — CYANOCOBALAMIN 1000 MCG/ML IJ SOLN
1000.0000 ug | Freq: Once | INTRAMUSCULAR | Status: AC
  Administered 2011-01-20: 1000 ug via INTRAMUSCULAR

## 2011-01-24 ENCOUNTER — Other Ambulatory Visit: Payer: Self-pay | Admitting: Internal Medicine

## 2011-01-29 NOTE — Progress Notes (Signed)
Addended by: Brien Mates R on: 01/29/2011 04:09 PM   Modules accepted: Orders

## 2011-03-02 ENCOUNTER — Ambulatory Visit: Payer: Medicare Other | Admitting: Internal Medicine

## 2011-03-02 ENCOUNTER — Telehealth: Payer: Self-pay | Admitting: *Deleted

## 2011-03-02 NOTE — Telephone Encounter (Signed)
Pt's wife called and states pt has developed rash that she states just started this morning. Pt's wife states the rash is itching in nature and pt wants to be seen, Pt lives 40 minutes away and I advised pts wife that pt could come on now and we would work him in. Pt states is not waiting and rufused to come here and stated he would go to the ER. I advise pts wife that if he went to the ER the wait would be even longer there and his best bet would be to come here to the office. Pt refused.

## 2011-03-17 ENCOUNTER — Encounter: Payer: Self-pay | Admitting: Pulmonary Disease

## 2011-04-13 ENCOUNTER — Ambulatory Visit (INDEPENDENT_AMBULATORY_CARE_PROVIDER_SITE_OTHER): Payer: Medicare Other | Admitting: Pulmonary Disease

## 2011-04-13 ENCOUNTER — Encounter: Payer: Self-pay | Admitting: Pulmonary Disease

## 2011-04-13 VITALS — BP 134/82 | HR 66 | Temp 98.3°F | Ht 70.0 in | Wt 198.2 lb

## 2011-04-13 DIAGNOSIS — G4733 Obstructive sleep apnea (adult) (pediatric): Secondary | ICD-10-CM | POA: Diagnosis not present

## 2011-04-13 NOTE — Progress Notes (Signed)
Chief Complaint  Patient presents with  . Follow-up    Patient states better since last visit. States wears cpap most nights for 5-7 hours a night.  Also states mask fits good.     History of Present Illness: Andrew Malone is a 71 y.o. male with OSA.  He has been doing better since he switched to nasal pillows, and doing better.  He wakes up occasionally with leg cramps.  He sleeps about 5 to 6 hours per night, and feels okay during the day.  He uses his CPAP on a regular basis.  He feels rested at night, and good energy level during the day.  Past Medical History  Diagnosis Date  . ANXIETY 12/27/2009  . CAD 12/30/2009  . CAROTID ARTERY DISEASE 11/26/2009  . CVA 12/11/2006  . DEPRESSION 12/27/2009  . GERD 12/11/2006  . GLOMERULONEPHRITIS 12/11/2006  . HIATAL HERNIA, HX OF 12/11/2006  . HYPERLIPIDEMIA 12/27/2009  . HYPERTENSION 12/11/2006  . OSA (obstructive sleep apnea) 09/26/2010    Past Surgical History  Procedure Date  . Inguinal herniorrhapy     No Known Allergies  Physical Exam:  Blood pressure 134/82, pulse 66, temperature 98.3 F (36.8 C), temperature source Oral, height 5\' 10"  (1.778 m), weight 198 lb 3.2 oz (89.903 kg), SpO2 98.00%. Body mass index is 28.44 kg/(m^2). Wt Readings from Last 2 Encounters:  04/13/11 198 lb 3.2 oz (89.903 kg)  01/19/11 193 lb 12.8 oz (87.907 kg)   General - Healthy  HEENT - Wears glasses, PERRLA, EOMI, No sinus tenderness, MP 2, decreased AP diameter, no LAN, no thyromegaly  Cardiac - s1s2 regular, no murmur, pulses symmetric  Chest - no wheeze/rales  Abd - soft, nontender  Ext - no e/c/c  Neuro - normal strength, CN intact  Psych - normal mood/behavior  Assessment/Plan:  Outpatient Encounter Prescriptions as of 04/13/2011  Medication Sig Dispense Refill  . acyclovir (ZOVIRAX) 200 MG capsule Take by mouth 3 (three) times daily as needed.        Marland Kitchen aspirin 81 MG EC tablet Take 81 mg by mouth daily.        Marland Kitchen atorvastatin  (LIPITOR) 20 MG tablet Take 1 tablet (20 mg total) by mouth daily.  90 tablet  3  . clonazePAM (KLONOPIN) 0.5 MG tablet Take 1 tablet (0.5 mg total) by mouth 3 (three) times daily as needed for anxiety. Take 1 tablet by mouth and every 8 hours as needed  90 tablet  5  . hydrochlorothiazide (HYDRODIURIL) 25 MG tablet TAKE 1 TABLET ONCE DAILY  90 tablet  1  . methocarbamol (ROBAXIN) 500 MG tablet Take 500 mg by mouth daily as needed.        Marland Kitchen omeprazole (PRILOSEC) 40 MG capsule Take 1 capsule (40 mg total) by mouth daily.  30 capsule  11  . HYDROcodone-acetaminophen (VICODIN ES) 7.5-750 MG per tablet take 1 tablet every 6 hours if needed for pain  60 tablet  3  . DISCONTD: meloxicam (MOBIC) 7.5 MG tablet Take 7.5 mg by mouth daily as needed.          Elijio Staples Pager:  [phone] 04/13/2011, 3:15 PM

## 2011-04-13 NOTE — Patient Instructions (Signed)
Follow-up in one year.

## 2011-04-13 NOTE — Assessment & Plan Note (Signed)
He is compliant with therapy and reports benefit from therapy.  No change to current set up.

## 2011-04-20 ENCOUNTER — Ambulatory Visit (INDEPENDENT_AMBULATORY_CARE_PROVIDER_SITE_OTHER): Payer: Medicare Other | Admitting: *Deleted

## 2011-04-20 DIAGNOSIS — E538 Deficiency of other specified B group vitamins: Secondary | ICD-10-CM

## 2011-04-20 MED ORDER — CYANOCOBALAMIN 1000 MCG/ML IJ SOLN
1000.0000 ug | Freq: Once | INTRAMUSCULAR | Status: AC
  Administered 2011-04-20: 1000 ug via INTRAMUSCULAR

## 2011-04-29 ENCOUNTER — Telehealth: Payer: Self-pay | Admitting: *Deleted

## 2011-04-29 NOTE — Telephone Encounter (Signed)
Pt's spouse called on behalf of pt. Pt has noticed red in stool for past couple of days but no blood when wiping. Pt's spouse transferred to scheduler to schedule appointment with [facility] ASAP. Appointment scheduled 04/30/2011.

## 2011-04-30 ENCOUNTER — Encounter: Payer: Self-pay | Admitting: Internal Medicine

## 2011-04-30 ENCOUNTER — Ambulatory Visit (INDEPENDENT_AMBULATORY_CARE_PROVIDER_SITE_OTHER): Payer: Medicare Other | Admitting: Internal Medicine

## 2011-04-30 VITALS — BP 132/82 | HR 78 | Temp 97.3°F | Resp 16 | Wt 199.2 lb

## 2011-04-30 DIAGNOSIS — K921 Melena: Secondary | ICD-10-CM | POA: Diagnosis not present

## 2011-05-03 DIAGNOSIS — K921 Melena: Secondary | ICD-10-CM | POA: Insufficient documentation

## 2011-05-03 NOTE — Assessment & Plan Note (Signed)
Several episodes of hematochezia. Negative rectal exam.  Plan - refer to GI

## 2011-05-03 NOTE — Progress Notes (Signed)
  Subjective:    [name]: Andrew Malone, male    DOB: 08-Jun-1940, 71 y.o.   MRN: [id number]  HPI Mr. Koors presents for evaluation after several episodes of hematochezia with BM. No spontaneous bleeding. No pain or discomfort. No active hemorrhoids by symptoms. He has chronic abdominal pain. He is current with colonoscopy but is due for follow-up.  PMH, FamHx and SocHx reviewed for any changes and relevance.    Review of Systems System review is negative for any constitutional, cardiac, pulmonary, GI or neuro symptoms or complaints other than as described in the HPI.     Objective:   Physical Exam Filed Vitals:   04/30/11 1507  BP: 132/82  Pulse: 78  Temp: 97.3 F (36.3 C)  Resp: 16  Weight: 199 lb 4 oz (90.379 kg)   Gen'l- WNWD white man in no distress Cor - RRR Pulm - normal respirations Abdomen - BS+ x 4, soft, no guarding or rebound. Rectal - NST, no visible hemorrhoid, no palpable internal hemorrhoid, no mass in the rectal vault, stool heme negative       Assessment & Plan:

## 2011-05-05 ENCOUNTER — Telehealth: Payer: Self-pay | Admitting: *Deleted

## 2011-05-05 ENCOUNTER — Encounter: Payer: Self-pay | Admitting: Internal Medicine

## 2011-05-05 NOTE — Telephone Encounter (Signed)
Pt has appt sch to see Dr Marina Goodell on 4/22, is this to long to wait? Does he need sooner appt?

## 2011-05-06 ENCOUNTER — Telehealth: Payer: Self-pay | Admitting: Internal Medicine

## 2011-05-06 NOTE — Telephone Encounter (Signed)
He can be direct if his medical hx is appropriate

## 2011-05-06 NOTE — Telephone Encounter (Signed)
Patient spoke w/Dr Perry's nurse this AM who is going to try and work patient in before the 04/22 appt.

## 2011-05-06 NOTE — Telephone Encounter (Signed)
If the blood in the stool has resolved it is ok to wait to 4/22. If there is continued bleeding let me know and we can try to get a sooner appointment

## 2011-05-06 NOTE — Telephone Encounter (Signed)
Spoke with pts wife and she is aware. Wife gave her cell number of [phone].

## 2011-05-06 NOTE — Telephone Encounter (Signed)
The first available colon appt that I see is April 23rd. When I mentioned that to the wife she states she is concerned and thinks it should be sooner. Please advise.

## 2011-05-06 NOTE — Telephone Encounter (Signed)
Pts wife called stating that the pt saw Dr. Debby Bud 04-30-11 for hematochezia with BM's. He also has chronic abdominal pain. Pt received recall letter in August 2012 but had not scheduled. Pt was scheduled for an OV with Dr. Marina Goodell for 4/22. Wife wants to know does he need an office visit or can he just be scheduled for the colon. She states he looks pale and she is worried about him and thinks he needs the colon as soon as possible. Dr. Marina Goodell please advise.

## 2011-05-06 NOTE — Telephone Encounter (Signed)
I'M SURE SHE DOES... PUT HIM ON A CANCELLATION LIST (YOU AND I WILL KEEP TRACK) AND WE WILL WORK HIM IN ACCORDINGLY.

## 2011-05-12 NOTE — Telephone Encounter (Signed)
Great.  Thanks

## 2011-05-12 NOTE — Telephone Encounter (Signed)
Spoke with pts wife and there has been a cancellation. Scheduled pt for previsit 05/18/11@9am . Colon scheduled for 05/20/11@3 :30pm. Pt aware of appt date and time.

## 2011-05-18 ENCOUNTER — Ambulatory Visit (AMBULATORY_SURGERY_CENTER): Payer: Medicare Other | Admitting: *Deleted

## 2011-05-18 VITALS — Ht 70.5 in | Wt 193.0 lb

## 2011-05-18 DIAGNOSIS — K921 Melena: Secondary | ICD-10-CM

## 2011-05-18 MED ORDER — PEG-KCL-NACL-NASULF-NA ASC-C 100 G PO SOLR: ORAL | Status: DC

## 2011-05-20 ENCOUNTER — Encounter: Payer: Self-pay | Admitting: Internal Medicine

## 2011-05-20 ENCOUNTER — Ambulatory Visit (AMBULATORY_SURGERY_CENTER): Payer: Medicare Other | Admitting: Internal Medicine

## 2011-05-20 VITALS — BP 156/81 | HR 69 | Temp 98.2°F | Resp 18 | Ht 70.0 in | Wt 193.0 lb

## 2011-05-20 DIAGNOSIS — Z86010 Personal history of colonic polyps: Secondary | ICD-10-CM | POA: Diagnosis not present

## 2011-05-20 DIAGNOSIS — D126 Benign neoplasm of colon, unspecified: Secondary | ICD-10-CM | POA: Diagnosis not present

## 2011-05-20 DIAGNOSIS — K921 Melena: Secondary | ICD-10-CM | POA: Diagnosis not present

## 2011-05-20 DIAGNOSIS — Z1211 Encounter for screening for malignant neoplasm of colon: Secondary | ICD-10-CM | POA: Diagnosis not present

## 2011-05-20 DIAGNOSIS — I10 Essential (primary) hypertension: Secondary | ICD-10-CM | POA: Diagnosis not present

## 2011-05-20 DIAGNOSIS — F411 Generalized anxiety disorder: Secondary | ICD-10-CM | POA: Diagnosis not present

## 2011-05-20 MED ORDER — SODIUM CHLORIDE 0.9 % IV SOLN: 500.0000 mL | INTRAVENOUS | Status: DC

## 2011-05-20 NOTE — Patient Instructions (Signed)
YOU HAD AN ENDOSCOPIC PROCEDURE TODAY AT THE [facility]: Refer to the procedure report that was given to you for any specific questions about what was found during the examination.  If the procedure report does not answer your questions, please call your gastroenterologist to clarify.  If you requested that your care partner not be given the details of your procedure findings, then the procedure report has been included in a sealed envelope for you to review at your convenience later.  YOU SHOULD EXPECT: Some feelings of bloating in the abdomen. Passage of more gas than usual.  Walking can help get rid of the air that was put into your GI tract during the procedure and reduce the bloating. If you had a lower endoscopy (such as a colonoscopy or flexible sigmoidoscopy) you may notice spotting of blood in your stool or on the toilet paper. If you underwent a bowel prep for your procedure, then you may not have a normal bowel movement for a few days.  DIET: Your first meal following the procedure should be a light meal and then it is ok to progress to your normal diet.  A half-sandwich or bowl of soup is an example of a good first meal.  Heavy or fried foods are harder to digest and may make you feel nauseous or bloated.  Likewise meals heavy in dairy and vegetables can cause extra gas to form and this can also increase the bloating.  Drink plenty of fluids but you should avoid alcoholic beverages for 24 hours.  ACTIVITY: Your care partner should take you home directly after the procedure.  You should plan to take it easy, moving slowly for the rest of the day.  You can resume normal activity the day after the procedure however you should NOT DRIVE or use heavy machinery for 24 hours (because of the sedation medicines used during the test).    SYMPTOMS TO REPORT IMMEDIATELY: A gastroenterologist can be reached at any hour.  During normal business hours, 8:30 AM to 5:00 PM Monday through Friday,  call [phone].  After hours and on weekends, please call the GI answering service at [phone] who will take a message and have the physician on call contact you.   Following lower endoscopy (colonoscopy or flexible sigmoidoscopy):  Excessive amounts of blood in the stool  Significant tenderness or worsening of abdominal pains  Swelling of the abdomen that is new, acute  Fever of 100F or higher    FOLLOW UP: If any biopsies were taken you will be contacted by phone or by letter within the next 1-3 weeks.  Call your gastroenterologist if you have not heard about the biopsies in 3 weeks.  Our staff will call the home number listed on your records the next business day following your procedure to check on you and address any questions or concerns that you may have at that time regarding the information given to you following your procedure. This is a courtesy call and so if there is no answer at the home number and we have not heard from you through the emergency physician on call, we will assume that you have returned to your regular daily activities without incident.  SIGNATURES/CONFIDENTIALITY: You and/or your care partner have signed paperwork which will be entered into your electronic medical record.  These signatures attest to the fact that that the information above on your After Visit Summary has been reviewed and is understood.  Full responsibility of the confidentiality   of this discharge information lies with you and/or your care-partner.    INFORMATION ON POLYPS ,DIVERTICULOSIS, HEMORRHOIDS, &HIGH FIBER DIET GIVEN TO YOU TODAY  

## 2011-05-20 NOTE — Progress Notes (Signed)
Patient did not experience any of the following events: a burn prior to discharge; a fall within the facility; wrong site/side/patient/procedure/implant event; or a hospital transfer or hospital admission upon discharge from the facility. (G8907) Patient did not have preoperative order for IV antibiotic SSI prophylaxis. (G8918)  

## 2011-05-20 NOTE — Op Note (Signed)
[facility] [address] [place], [place]  [address]  COLONOSCOPY PROCEDURE REPORT  PATIENT:  Andrew Malone, Andrew Malone  MR#:  [id number] BIRTHDATE:  August 24, 1940, 70 yrs. old  GENDER:  male ENDOSCOPIST:  Wilhemina Bonito. Eda Keys, MD REF. BY:  Surveillance Program Recall, PROCEDURE DATE:  05/20/2011 PROCEDURE:  Colonoscopy with snare polypectomy X 2 ASA CLASS:  Class II INDICATIONS:  history of pre-cancerous (adenomatous) colon polyps, surveillance and high-risk screening, minor rectal bleeding ; PRIOR EXAMS (TA) 2003, 2007 MEDICATIONS:   MAC sedation, administered by CRNA, propofol (Diprivan) 200 mg IV  DESCRIPTION OF PROCEDURE:   After the risks benefits and alternatives of the procedure were thoroughly explained, informed consent was obtained.  Digital rectal exam was performed and revealed no abnormalities.   The [id number] endoscope was introduced through the anus and advanced to the cecum, which was identified by both the appendix and ileocecal valve, without limitations.  The quality of the prep was excellent, using MoviPrep.  The instrument was then slowly withdrawn as the colon was fully examined. <<PROCEDUREIMAGES>>  FINDINGS:  Two 3mm polyps were found in the ascending colon and snared without cautery. Retrieval was successful.  Severe diverticulosis was found transverse to sigmoid  Otherwise normal colonoscopy without other polyps, masses, vascular ectasias, or inflammatory changes.   Retroflexed views in the rectum revealed internal hemorrhoids.    The time to cecum = 2:17  minutes. The scope was then withdrawn in  12:53  minutes from the cecum and the procedure completed.  COMPLICATIONS:  None  ENDOSCOPIC IMPRESSION: 1) Two polyps in the ascending colon - removed 2) Severe diverticulosis in the transverse to sigmoid 3) Otherwise normal colonoscopy 4) Internal hemorrhoids  RECOMMENDATIONS: 1) Follow up colonoscopy in 5 years  ______________________________ Wilhemina Bonito. Eda Keys, MD  CC:  Jacques Navy, MD; The Patient  n. eSIGNED:   Wilhemina Bonito. Eda Keys at 05/20/2011 03:39 PM  Ocie Bob, [phone]

## 2011-05-21 ENCOUNTER — Telehealth: Payer: Self-pay | Admitting: *Deleted

## 2011-05-21 NOTE — Telephone Encounter (Signed)
  Follow up Call-  Call back number 05/20/2011  Post procedure Call Back phone  # cell [phone]  Permission to leave phone message Yes     Patient questions:  Do you have a fever, pain , or abdominal swelling? no Pain Score  0 *  Have you tolerated food without any problems? yes  Have you been able to return to your normal activities? yes  Do you have any questions about your discharge instructions: Diet   no Medications  no Follow up visit  no  Do you have questions or concerns about your Care? no  Actions: * If pain score is 4 or above: No action needed, pain <4.

## 2011-05-25 ENCOUNTER — Ambulatory Visit (INDEPENDENT_AMBULATORY_CARE_PROVIDER_SITE_OTHER): Payer: Medicare Other | Admitting: Internal Medicine

## 2011-05-25 ENCOUNTER — Encounter: Payer: Self-pay | Admitting: Internal Medicine

## 2011-05-25 VITALS — BP 158/82 | HR 75 | Temp 98.4°F | Resp 16 | Wt 193.5 lb

## 2011-05-25 DIAGNOSIS — J329 Chronic sinusitis, unspecified: Secondary | ICD-10-CM | POA: Diagnosis not present

## 2011-05-25 DIAGNOSIS — E538 Deficiency of other specified B group vitamins: Secondary | ICD-10-CM | POA: Diagnosis not present

## 2011-05-25 MED ORDER — AMOXICILLIN 875 MG PO TABS: 875.0000 mg | ORAL_TABLET | Freq: Two times a day (BID) | ORAL | Status: AC

## 2011-05-25 MED ORDER — CYANOCOBALAMIN 1000 MCG/ML IJ SOLN
1000.0000 ug | Freq: Once | INTRAMUSCULAR | Status: AC
  Administered 2011-05-25: 1000 ug via INTRAMUSCULAR

## 2011-05-25 NOTE — Patient Instructions (Signed)
Upper respiratory infection/sinus infection - no lung involvement. Plan - Amoxicillin 875 mg twice a day for 7 days. Continue with low dose decongestant - sudafed 30 mg two or three times a day. For cough - syrup of choice (I like robitussin DM). Vaporizer treatments may help relieve your symptoms.   Sinusitis Sinuses are air pockets within the bones of your face. The growth of bacteria within a sinus leads to infection. The infection prevents the sinuses from draining. This infection is called sinusitis. SYMPTOMS   There will be different areas of pain depending on which sinuses have become infected.  The maxillary sinuses often produce pain beneath the eyes.   Frontal sinusitis may cause pain in the middle of the forehead and above the eyes.  Other problems (symptoms) include:  Toothaches.   Colored, pus-like (purulent) drainage from the nose.   Swelling, warmth, and tenderness over the sinus areas may be signs of infection.  TREATMENT   Sinusitis is most often determined by an exam.X-rays may be taken. If x-rays have been taken, make sure you obtain your results or find out how you are to obtain them. Your caregiver may give you medications (antibiotics). These are medications that will help kill the bacteria causing the infection. You may also be given a medication (decongestant) that helps to reduce sinus swelling.   HOME CARE INSTRUCTIONS    Only take over-the-counter or prescription medicines for pain, discomfort, or fever as directed by your caregiver.   Drink extra fluids. Fluids help thin the mucus so your sinuses can drain more easily.   Applying either moist heat or ice packs to the sinus areas may help relieve discomfort.   Use saline nasal sprays to help moisten your sinuses. The sprays can be found at your local drugstore.  SEEK IMMEDIATE MEDICAL CARE IF:  You have a fever.   You have increasing pain, severe headaches, or toothache.   You have nausea, vomiting, or  drowsiness.   You develop unusual swelling around the face or trouble seeing.  MAKE SURE YOU:    Understand these instructions.   Will watch your condition.   Will get help right away if you are not doing well or get worse.  Document Released: 02/16/2005 Document Revised: 02/05/2011 Document Reviewed: 09/15/2006 [facility] Patient Information 2012 [place], [place].

## 2011-05-25 NOTE — Progress Notes (Signed)
  Subjective:    [name]: Andrew Malone, male    DOB: March 26, 1940, 71 y.o.   MRN: [id number]  HPI Andrew Malone presents for sinus pressure frontal and maxillary sinus, cough productive of blood tinged mucus, post nasal drainage. NO SOB. Low grade fever but he doesn't feel feverish. No N/V/D. No sick contacts.  PMH, FamHx and SocHx reviewed for any changes and relevance.  Review of Systems System review is negative for any constitutional, cardiac, pulmonary, GI or neuro symptoms or complaints other than as described in the HPI.     Objective:   Physical Exam Filed Vitals:   05/25/11 1636  BP: 158/82  Pulse: 75  Temp: 98.4 F (36.9 C)  Resp: 16   gen'l- well nourished white man in no acute distress HEENT - mild tenderness to percussion over the facial sinuses, TMs normal, throat with erythematous cobblestone appearance Nodes - neg submandibular and cervical regions. Cor- RRR Pulm - normal respirations w/o rales or wheezes.       Assessment & Plan:  Sinusitis - mild symptoms.  Plan - Amox 875 mg bid x 7 days           Supportive care.

## 2011-05-26 ENCOUNTER — Encounter: Payer: Self-pay | Admitting: Internal Medicine

## 2011-05-26 ENCOUNTER — Other Ambulatory Visit: Payer: Self-pay | Admitting: Internal Medicine

## 2011-05-28 NOTE — Telephone Encounter (Signed)
Rx faxed to pharmacy  

## 2011-05-28 NOTE — Telephone Encounter (Signed)
Refill request received for Klonopin 0.5 mg 1 po q8h prn # 90 x 5, please advise.

## 2011-06-02 ENCOUNTER — Telehealth: Payer: Self-pay | Admitting: Cardiology

## 2011-06-02 NOTE — Telephone Encounter (Addendum)
Pt's wife calling re getting a letter re a cartotid, do not see a recall, sent letter, or have an order for one, last one was in October, does he need another? If so, can get order? Per pam send message to falecha for order

## 2011-06-02 NOTE — Telephone Encounter (Signed)
Pt is due for carotid.  Andrew Malone aware and will schedule the appointment

## 2011-06-15 ENCOUNTER — Telehealth: Payer: Self-pay

## 2011-06-15 MED ORDER — PROMETHAZINE-DM 6.25-15 MG/5ML PO SYRP: 5.0000 mL | ORAL_SOLUTION | Freq: Four times a day (QID) | ORAL | Status: AC | PRN

## 2011-06-15 NOTE — Telephone Encounter (Signed)
Pt's spouse advised of Rx/pharmacy 

## 2011-06-15 NOTE — Telephone Encounter (Signed)
Cough syrup ordered, pls f/up soon

## 2011-06-15 NOTE — Telephone Encounter (Signed)
Pt's spouse called stating pt is having cough associated with sinus infection.  He was seen by Dr Debby Bud but nothing was prescribed. Pt is requesting Rx to treat cough, please advise in Dr Debby Bud' absence.

## 2011-06-16 ENCOUNTER — Telehealth: Payer: Self-pay | Admitting: Cardiology

## 2011-06-16 NOTE — Telephone Encounter (Signed)
Spoke with pt wife, follow up carotid doppler scheduled.

## 2011-06-16 NOTE — Telephone Encounter (Signed)
New msg Pt's wife wants to talk to you about carotid test.  Please call

## 2011-06-22 ENCOUNTER — Encounter (INDEPENDENT_AMBULATORY_CARE_PROVIDER_SITE_OTHER): Payer: Medicare Other

## 2011-06-22 ENCOUNTER — Ambulatory Visit: Payer: Medicare Other | Admitting: Internal Medicine

## 2011-06-22 DIAGNOSIS — I6529 Occlusion and stenosis of unspecified carotid artery: Secondary | ICD-10-CM

## 2011-06-22 DIAGNOSIS — R42 Dizziness and giddiness: Secondary | ICD-10-CM

## 2011-06-23 ENCOUNTER — Encounter: Payer: Medicare Other | Admitting: Internal Medicine

## 2011-06-23 ENCOUNTER — Telehealth: Payer: Self-pay | Admitting: *Deleted

## 2011-06-23 NOTE — Telephone Encounter (Signed)
Spoke with pt wife, aware referral will be made to [facility]. Doppler faxed to their office.

## 2011-06-23 NOTE — Telephone Encounter (Signed)
Message copied by Freddi Starr on Tue Jun 23, 2011  8:29 AM ------      Message from: Lewayne Bunting      Created: Mon Jun 22, 2011  4:39 PM      Regarding: FW: Prelim Carotid       Schedule appt with vascular surgery.      Olga Millers            ----- Message -----         From: Milbert Coulter         Sent: 06/22/2011  12:34 PM           To: Deliah Goody, RN, Lewayne Bunting, MD      Subject: Prelim Carotid                                           Patient in for routine Carotid today.  Right side has progressed to >80 stenosis.  He c/o dizziness with exertion, but no other symptoms.  Just missed you in the office, spoke with Dr. Excell Seltzer. He said OK to let patient go and you would follow up with him.

## 2011-06-24 ENCOUNTER — Telehealth (HOSPITAL_COMMUNITY): Payer: Self-pay

## 2011-06-24 ENCOUNTER — Other Ambulatory Visit: Payer: Self-pay | Admitting: Cardiology

## 2011-06-24 DIAGNOSIS — I6529 Occlusion and stenosis of unspecified carotid artery: Secondary | ICD-10-CM

## 2011-06-24 NOTE — Telephone Encounter (Signed)
Spoke w/ [name].  Dr. Corliss Skains will see [name] 06-25-11 @ 2pm..

## 2011-06-24 NOTE — Telephone Encounter (Signed)
Received a call from pt's wife in regards to scheduling a procedure.  Called Dr. Ludwig Clarks office and spoke with Stanton Kidney.  She stated that this was a pt request for Dr. Corliss Skains because they would have referred to Dr. Durwin Nora.  She stated that she sent the fax over at 9am.  I requested her to refax the order, med list, and demographics so that I could get the information to Dr. Corliss Skains in order to get the pt scheduled.

## 2011-06-25 ENCOUNTER — Encounter (HOSPITAL_COMMUNITY): Payer: Self-pay | Admitting: Pharmacy Technician

## 2011-06-25 ENCOUNTER — Other Ambulatory Visit (HOSPITAL_COMMUNITY): Payer: Self-pay | Admitting: Interventional Radiology

## 2011-06-25 ENCOUNTER — Ambulatory Visit (HOSPITAL_COMMUNITY)
Admission: RE | Admit: 2011-06-25 | Discharge: 2011-06-25 | Disposition: A | Discharge status: Home / Self Care | Payer: Medicare Other | Source: Ambulatory Visit | Attending: Cardiology | Admitting: Cardiology

## 2011-06-25 DIAGNOSIS — H539 Unspecified visual disturbance: Secondary | ICD-10-CM | POA: Diagnosis not present

## 2011-06-25 DIAGNOSIS — I6529 Occlusion and stenosis of unspecified carotid artery: Secondary | ICD-10-CM | POA: Diagnosis not present

## 2011-06-26 ENCOUNTER — Other Ambulatory Visit: Payer: Self-pay | Admitting: Radiology

## 2011-06-29 ENCOUNTER — Encounter (HOSPITAL_COMMUNITY)
Admission: RE | Admit: 2011-06-29 | Discharge: 2011-06-29 | Disposition: A | Discharge status: Home / Self Care | Payer: Medicare Other | Source: Ambulatory Visit | Attending: Interventional Radiology | Admitting: Interventional Radiology

## 2011-06-29 ENCOUNTER — Encounter (HOSPITAL_COMMUNITY): Payer: Self-pay

## 2011-06-29 ENCOUNTER — Telehealth (HOSPITAL_COMMUNITY): Payer: Self-pay

## 2011-06-29 ENCOUNTER — Other Ambulatory Visit: Payer: Self-pay | Admitting: Physician Assistant

## 2011-06-29 ENCOUNTER — Encounter (HOSPITAL_COMMUNITY): Payer: Self-pay | Admitting: Pharmacy Technician

## 2011-06-29 ENCOUNTER — Ambulatory Visit (HOSPITAL_COMMUNITY)
Admission: RE | Admit: 2011-06-29 | Discharge: 2011-06-29 | Disposition: A | Discharge status: Home / Self Care | Payer: Medicare Other | Source: Ambulatory Visit | Attending: Physician Assistant | Admitting: Physician Assistant

## 2011-06-29 DIAGNOSIS — R918 Other nonspecific abnormal finding of lung field: Secondary | ICD-10-CM | POA: Diagnosis not present

## 2011-06-29 DIAGNOSIS — R059 Cough, unspecified: Secondary | ICD-10-CM | POA: Insufficient documentation

## 2011-06-29 DIAGNOSIS — Z01812 Encounter for preprocedural laboratory examination: Secondary | ICD-10-CM | POA: Insufficient documentation

## 2011-06-29 DIAGNOSIS — Z01818 Encounter for other preprocedural examination: Secondary | ICD-10-CM | POA: Insufficient documentation

## 2011-06-29 DIAGNOSIS — Z01811 Encounter for preprocedural respiratory examination: Secondary | ICD-10-CM | POA: Diagnosis not present

## 2011-06-29 DIAGNOSIS — R05 Cough: Secondary | ICD-10-CM | POA: Insufficient documentation

## 2011-06-29 LAB — CBC
HCT: 42.8 % (ref 39.0–52.0)
Hemoglobin: 14.5 g/dL (ref 13.0–17.0)
RBC: 4.97 MIL/uL (ref 4.22–5.81)
RDW: 13.2 % (ref 11.5–15.5)
WBC: 6.7 10*3/uL (ref 4.0–10.5)

## 2011-06-29 LAB — COMPREHENSIVE METABOLIC PANEL
Albumin: 4.2 g/dL (ref 3.5–5.2)
Alkaline Phosphatase: 96 U/L (ref 39–117)
BUN: 13 mg/dL (ref 6–23)
CO2: 30 mEq/L (ref 19–32)
Chloride: 102 mEq/L (ref 96–112)
GFR calc non Af Amer: 83 mL/min — ABNORMAL LOW (ref >90)
Glucose, Bld: 95 mg/dL (ref 70–99)
Potassium: 3.9 mEq/L (ref 3.5–5.1)
Total Bilirubin: 0.8 mg/dL (ref 0.3–1.2)

## 2011-06-29 LAB — DIFFERENTIAL
Basophils Relative: 0 % (ref 0–1)
Eosinophils Absolute: 0.2 10*3/uL (ref 0.0–0.7)
Monocytes Relative: 11 % (ref 3–12)
Neutrophils Relative %: 56 % (ref 43–77)

## 2011-06-29 LAB — PROTIME-INR: INR: 0.95 (ref 0.00–1.49)

## 2011-06-29 LAB — APTT: aPTT: 24 seconds (ref 24–37)

## 2011-06-29 NOTE — Pre-Procedure Instructions (Signed)
20 Andrew Malone  06/29/2011   Your procedure is scheduled on:  07/01/11  Report to [facility] at 700 AM.  Call this number if you have problems the morning of surgery: [phone]   Remember:   Do not eat food:After Midnight.  May have clear liquids: up to 4 Hours before arrival.  Clear liquids include soda, tea, black coffee, apple or grape juice, broth.  Take these medicines the morning of surgery with A SIP OF WATER: *vicodan,prilosec,flomax**   Do not wear jewelry, make-up or nail polish.  Do not wear lotions, powders, or perfumes. You may wear deodorant.  Do not shave 48 hours prior to surgery.  Do not bring valuables to the hospital.  Contacts, dentures or bridgework may not be worn into surgery.  Leave suitcase in the car. After surgery it may be brought to your room.  For patients admitted to the hospital, checkout time is 11:00 AM the day of discharge.   Patients discharged the day of surgery will not be allowed to drive home.  Name and phone number of your driver: family  Special Instructions: CHG Shower Use Special Wash: 1/2 bottle night before surgery and 1/2 bottle morning of surgery.   Please read over the following fact sheets that you were given: Pain Booklet, Coughing and Deep Breathing, MRSA Information and Surgical Site Infection Prevention

## 2011-06-29 NOTE — Pre-Procedure Instructions (Signed)
20 Andrew Malone  06/29/2011   Your procedure is scheduled on:  07/02/11  Report to [facility] at 700 AM.  Call this number if you have problems the morning of surgery: [phone]   Remember:   Do not eat food:After Midnight.  May have clear liquids: up to 4 Hours before arrival.  Clear liquids include soda, tea, black coffee, apple or grape juice, broth.  Take these medicines the morning of surgery with A SIP OF WATER: *plavix,vicodan,protonix,flomax   Do not wear jewelry, make-up or nail polish.  Do not wear lotions, powders, or perfumes. You may wear deodorant.  Do not shave 48 hours prior to surgery.  Do not bring valuables to the hospital.  Contacts, dentures or bridgework may not be worn into surgery.  Leave suitcase in the car. After surgery it may be brought to your room.  For patients admitted to the hospital, checkout time is 11:00 AM the day of discharge.   Patients discharged the day of surgery will not be allowed to drive home.  Name and phone number of your driver: wife  Special Instructions: CHG Shower Use Special Wash: 1/2 bottle night before surgery and 1/2 bottle morning of surgery.   Please read over the following fact sheets that you were given: Pain Booklet, Coughing and Deep Breathing, MRSA Information and Surgical Site Infection Prevention

## 2011-06-29 NOTE — Telephone Encounter (Signed)
Called Mr. Hodes to make him aware of rescheduling his procedure w/ Dr. Corliss Skains due to the emergency.  Spoke with his wife.  She also asked about a chest xray.  I checked w/ Michael Litter.  She stated that the pt will have a chest xray at preop.  Called Mrs. Boquet back and made them aware as well.  JLM

## 2011-06-29 NOTE — Progress Notes (Signed)
Sleep study received.echo and cath in epic

## 2011-07-01 ENCOUNTER — Ambulatory Visit (HOSPITAL_COMMUNITY): Payer: Medicare Other

## 2011-07-01 HISTORY — PX: CORONARY ANGIOPLASTY WITH STENT PLACEMENT: SHX49

## 2011-07-02 ENCOUNTER — Encounter (HOSPITAL_COMMUNITY)
Admission: RE | Disposition: A | Discharge status: Home / Self Care | Payer: Self-pay | Source: Ambulatory Visit | Attending: Interventional Radiology

## 2011-07-02 ENCOUNTER — Inpatient Hospital Stay (HOSPITAL_COMMUNITY)
Admission: RE | Admit: 2011-07-02 | Discharge: 2011-07-03 | DRG: 036 | Disposition: A | Discharge status: Home / Self Care | Payer: Medicare Other | Source: Ambulatory Visit | Attending: Interventional Radiology | Admitting: Interventional Radiology

## 2011-07-02 ENCOUNTER — Encounter (HOSPITAL_COMMUNITY): Payer: Self-pay | Admitting: *Deleted

## 2011-07-02 ENCOUNTER — Ambulatory Visit (HOSPITAL_COMMUNITY): Payer: Medicare Other | Admitting: Certified Registered Nurse Anesthetist

## 2011-07-02 ENCOUNTER — Encounter (HOSPITAL_COMMUNITY): Payer: Self-pay | Admitting: Certified Registered Nurse Anesthetist

## 2011-07-02 ENCOUNTER — Ambulatory Visit (HOSPITAL_COMMUNITY)
Admission: RE | Admit: 2011-07-02 | Discharge: 2011-07-02 | Disposition: A | Discharge status: Home / Self Care | Payer: Medicare Other | Source: Ambulatory Visit | Attending: Interventional Radiology | Admitting: Interventional Radiology

## 2011-07-02 DIAGNOSIS — I6529 Occlusion and stenosis of unspecified carotid artery: Principal | ICD-10-CM | POA: Diagnosis present

## 2011-07-02 DIAGNOSIS — I10 Essential (primary) hypertension: Secondary | ICD-10-CM | POA: Diagnosis present

## 2011-07-02 DIAGNOSIS — Z8673 Personal history of transient ischemic attack (TIA), and cerebral infarction without residual deficits: Secondary | ICD-10-CM

## 2011-07-02 DIAGNOSIS — G4733 Obstructive sleep apnea (adult) (pediatric): Secondary | ICD-10-CM | POA: Diagnosis present

## 2011-07-02 DIAGNOSIS — I2510 Atherosclerotic heart disease of native coronary artery without angina pectoris: Secondary | ICD-10-CM | POA: Diagnosis present

## 2011-07-02 DIAGNOSIS — K219 Gastro-esophageal reflux disease without esophagitis: Secondary | ICD-10-CM | POA: Diagnosis present

## 2011-07-02 DIAGNOSIS — E785 Hyperlipidemia, unspecified: Secondary | ICD-10-CM | POA: Diagnosis present

## 2011-07-02 DIAGNOSIS — F411 Generalized anxiety disorder: Secondary | ICD-10-CM | POA: Diagnosis present

## 2011-07-02 LAB — POCT ACTIVATED CLOTTING TIME: Activated Clotting Time: 149 seconds

## 2011-07-02 SURGERY — RADIOLOGY WITH ANESTHESIA
Anesthesia: General | Laterality: Right

## 2011-07-02 MED ORDER — HEPARIN (PORCINE) IN NACL 100-0.45 UNIT/ML-% IJ SOLN
850.0000 [IU]/h | INTRAMUSCULAR | Status: DC
  Administered 2011-07-02: 500 [IU]/h via INTRAVENOUS

## 2011-07-02 MED ORDER — SODIUM CHLORIDE 0.9 % IV SOLN
INTRAVENOUS | Status: AC
  Administered 2011-07-03: via INTRAVENOUS

## 2011-07-02 MED ORDER — CLOPIDOGREL BISULFATE 75 MG PO TABS
75.0000 mg | ORAL_TABLET | Freq: Every day | ORAL | Status: DC
  Administered 2011-07-03: 75 mg via ORAL
  Filled 2011-07-02 (×2): qty 1

## 2011-07-02 MED ORDER — NITROGLYCERIN IN D5W 200-5 MCG/ML-% IV SOLN
INTRAVENOUS | Status: DC | PRN
  Administered 2011-07-02: 5 ug/min via INTRAVENOUS

## 2011-07-02 MED ORDER — GLYCOPYRROLATE 0.2 MG/ML IJ SOLN
INTRAMUSCULAR | Status: DC | PRN
  Administered 2011-07-02: 0.1 mg via INTRAVENOUS

## 2011-07-02 MED ORDER — MIDAZOLAM HCL 5 MG/5ML IJ SOLN
INTRAMUSCULAR | Status: DC | PRN
  Administered 2011-07-02 (×4): 1 mg via INTRAVENOUS

## 2011-07-02 MED ORDER — SODIUM CHLORIDE 0.9 % IV SOLN: Freq: Once | INTRAVENOUS | Status: DC

## 2011-07-02 MED ORDER — LACTATED RINGERS IV SOLN
INTRAVENOUS | Status: DC | PRN
  Administered 2011-07-02: 08:00:00 via INTRAVENOUS

## 2011-07-02 MED ORDER — ASPIRIN 325 MG PO TABS
325.0000 mg | ORAL_TABLET | Freq: Every day | ORAL | Status: DC
  Administered 2011-07-03: 325 mg via ORAL
  Filled 2011-07-02: qty 1

## 2011-07-02 MED ORDER — CLONAZEPAM 0.5 MG PO TABS
0.5000 mg | ORAL_TABLET | Freq: Every day | ORAL | Status: AC
  Administered 2011-07-02: 0.5 mg via ORAL
  Filled 2011-07-02: qty 1

## 2011-07-02 MED ORDER — CEFAZOLIN SODIUM 1-5 GM-% IV SOLN: 1.0000 g | Freq: Once | INTRAVENOUS | Status: DC

## 2011-07-02 MED ORDER — FENTANYL CITRATE 0.05 MG/ML IJ SOLN
INTRAMUSCULAR | Status: DC | PRN
  Administered 2011-07-02 (×6): 25 ug via INTRAVENOUS

## 2011-07-02 MED ORDER — HYDROMORPHONE HCL PF 1 MG/ML IJ SOLN: 0.2500 mg | INTRAMUSCULAR | Status: DC | PRN

## 2011-07-02 MED ORDER — NICARDIPINE HCL IN NACL 20-0.86 MG/200ML-% IV SOLN
5.0000 mg/h | INTRAVENOUS | Status: DC
  Filled 2011-07-02: qty 200

## 2011-07-02 MED ORDER — SODIUM CHLORIDE 0.9 % IJ SOLN
1.5000 mg | INTRAVENOUS | Status: DC
  Filled 2011-07-02: qty 0.3

## 2011-07-02 MED ORDER — HEPARIN SODIUM (PORCINE) 1000 UNIT/ML IJ SOLN
INTRAMUSCULAR | Status: DC | PRN
  Administered 2011-07-02: 1000 [IU] via INTRAVENOUS
  Administered 2011-07-02 (×2): 500 [IU] via INTRAVENOUS
  Administered 2011-07-02: 1000 [IU] via INTRAVENOUS
  Administered 2011-07-02: 500 [IU] via INTRAVENOUS
  Administered 2011-07-02: 3000 [IU] via INTRAVENOUS

## 2011-07-02 MED ORDER — CEFAZOLIN SODIUM-DEXTROSE 2-3 GM-% IV SOLR
2.0000 g | Freq: Once | INTRAVENOUS | Status: AC
  Administered 2011-07-02: 2 g via INTRAVENOUS
  Filled 2011-07-02: qty 50

## 2011-07-02 MED ORDER — ACETAMINOPHEN 650 MG RE SUPP: 650.0000 mg | Freq: Four times a day (QID) | RECTAL | Status: DC | PRN

## 2011-07-02 MED ORDER — ONDANSETRON HCL 4 MG/2ML IJ SOLN: 4.0000 mg | Freq: Four times a day (QID) | INTRAMUSCULAR | Status: DC | PRN

## 2011-07-02 MED ORDER — IOHEXOL 300 MG/ML  SOLN
300.0000 mL | Freq: Once | INTRAMUSCULAR | Status: AC | PRN
  Administered 2011-07-02: 120 mL via INTRAVENOUS

## 2011-07-02 MED ORDER — ACETAMINOPHEN 500 MG PO TABS: 1000.0000 mg | ORAL_TABLET | Freq: Four times a day (QID) | ORAL | Status: DC | PRN

## 2011-07-02 MED ORDER — MORPHINE SULFATE 2 MG/ML IJ SOLN: 0.0500 mg/kg | INTRAMUSCULAR | Status: DC | PRN

## 2011-07-02 MED ORDER — PHENYLEPHRINE HCL 10 MG/ML IJ SOLN
INTRAMUSCULAR | Status: DC | PRN
  Administered 2011-07-02: 20 ug via INTRAVENOUS

## 2011-07-02 MED ORDER — ASPIRIN 81 MG PO CHEW
324.0000 mg | CHEWABLE_TABLET | Freq: Once | ORAL | Status: AC
  Administered 2011-07-02: 324 mg via ORAL

## 2011-07-02 MED ORDER — ONDANSETRON HCL 4 MG/2ML IJ SOLN: 4.0000 mg | Freq: Once | INTRAMUSCULAR | Status: DC | PRN

## 2011-07-02 MED ORDER — HEPARIN (PORCINE) IN NACL 100-0.45 UNIT/ML-% IJ SOLN
500.0000 [IU]/h | INTRAMUSCULAR | Status: DC
  Filled 2011-07-02: qty 250

## 2011-07-02 MED ORDER — HEPARIN (PORCINE) IN NACL 100-0.45 UNIT/ML-% IJ SOLN
700.0000 [IU]/h | INTRAMUSCULAR | Status: AC
  Administered 2011-07-02: 700 [IU]/h via INTRAVENOUS

## 2011-07-02 MED ORDER — ASPIRIN EC 325 MG PO TBEC
DELAYED_RELEASE_TABLET | ORAL | Status: AC
  Administered 2011-07-02: 325 mg via OROMUCOSAL
  Filled 2011-07-02: qty 1

## 2011-07-02 MED ORDER — CEFAZOLIN SODIUM-DEXTROSE 2-3 GM-% IV SOLR
INTRAVENOUS | Status: AC
  Filled 2011-07-02: qty 50

## 2011-07-02 NOTE — Transfer of Care (Signed)
Immediate Anesthesia Transfer of Care Note  Patient: Andrew Malone  Procedure(s) Performed: Procedure(s) (LRB): RADIOLOGY WITH ANESTHESIA (Right)  Patient Location: PACU  Anesthesia Type: MAC  Level of Consciousness: awake, alert , oriented and patient cooperative  Airway & Oxygen Therapy: Patient Spontanous Breathing and Patient connected to nasal cannula oxygen  Post-op Assessment: Report given to PACU RN, Post -op Vital signs reviewed and stable and Patient moving all extremities X 4  Post vital signs: Reviewed and stable  Complications: No apparent anesthesia complications

## 2011-07-02 NOTE — Preoperative (Signed)
Beta Blockers   Reason not to administer Beta Blockers:Not Applicable 

## 2011-07-02 NOTE — H&P (Signed)
Chief Complaint: Right ICA stenosis HPI: Andrew Malone is an 71 y.o. male with history of coronary artery  disease and cerebrovascular disease who had a routine follow-up  carotid Doppler study performed on 06/22/2011. In comparison with  prior study performed in 12/2010 there was an increase in peak  systolic velocity as well as stenosis on the right side;  specifically documented as 80-99% RICA stenosis. The patient has  had one single episode of right visual disturbance that lasted for  approximately 15-20 minutes. He describes it as a circular visual  field loss that resolved spontaneously with no further episodes.  The patient denies any headaches, aura, nausea or vomiting  associated with his visual disturbance. His left eye vision was  not changed during this episode. The only other symptom the  patient complains of is dyspnea on exertion only with stair  climbing which is resolved with rest. He was seen by Dr. Corliss Skains and scheduled for cerebral angiogram with possible angioplasty and stenting of RT ICA. He denies any changes or recent illness since being seen by Dr. Corliss Skains.  Past Medical History:  Past Medical History  Diagnosis Date  . ANXIETY 12/27/2009  . CAD 12/30/2009  . CAROTID ARTERY DISEASE 11/26/2009  . CVA 12/11/2006  . DEPRESSION 12/27/2009  . GERD 12/11/2006  . GLOMERULONEPHRITIS 12/11/2006  . HIATAL HERNIA, HX OF 12/11/2006  . HYPERLIPIDEMIA 12/27/2009  . Allergy     seasonal  . Cancer     skin  . Heart murmur   . Ulcer 1994    gastric  . OSA (obstructive sleep apnea) 09/26/2010    [facility]    sleep study 2012  . HYPERTENSION 12/11/2006    dr Debby Bud   [facility]     dr Jens Som  cardiac    Past Surgical History:  Past Surgical History  Procedure Date  . Inguinal herniorrhapy   . Cardiac catheterization     07    Family History:  Family History  Problem Relation Age of Onset  . Heart attack Mother   . Hypertension Mother   . Hyperlipidemia  Mother   . Diabetes Mother   . Coronary artery disease Mother   . Hypertension Sister   . Hyperlipidemia Sister   . Diabetes Sister   . Diabetes Brother     severe  . Emphysema Brother   . Lung cancer Sister   . Lung cancer Father   . Breast cancer Sister     Social History:  reports that he has never smoked. He has quit using smokeless tobacco. His smokeless tobacco use included Chew. He reports that he does not drink alcohol or use illicit drugs.  Allergies: No Known Allergies  Medications: Medications Prior to Admission  Medication Sig Dispense Refill  . acyclovir (ZOVIRAX) 200 MG capsule Take 200 mg by mouth 3 (three) times daily as needed. For herpes outbreaks      . aspirin 81 MG EC tablet Take 81 mg by mouth daily.       Marland Kitchen atorvastatin (LIPITOR) 20 MG tablet Take 20 mg by mouth daily.      . clonazePAM (KLONOPIN) 0.5 MG tablet Take 0.5 mg by mouth 3 (three) times daily as needed. For anxiety      . clopidogrel (PLAVIX) 75 MG tablet Take 75 mg by mouth daily.      . hydrochlorothiazide (HYDRODIURIL) 25 MG tablet Take 25 mg by mouth daily.      . pantoprazole (PROTONIX) 40 MG tablet Take  40 mg by mouth daily.      . Tamsulosin HCl (FLOMAX) 0.4 MG CAPS Take 1 capsule by mouth at bedtime.      Marland Kitchen HYDROcodone-acetaminophen (VICODIN ES) 7.5-750 MG per tablet Take 1 tablet by mouth every 6 (six) hours as needed. For pain      . methocarbamol (ROBAXIN) 500 MG tablet Take 500 mg by mouth daily as needed. For pain        Please HPI for pertinent positives, otherwise complete 10 system ROS negative.  Blood pressure 127/73, pulse 81, temperature 98 F (36.7 C), temperature source Oral, resp. rate 18, SpO2 98.00%. There is no height or weight on file to calculate BMI.   General Appearance:  Alert, cooperative, no distress, appears stated age  Head:  Normocephalic, without obvious abnormality, atraumatic  ENT: Unremarkable  Neck: Supple, symmetrical, trachea midline, no adenopathy,  thyroid: not enlarged, symmetric, no tenderness/mass/nodules  Lungs:   Clear to auscultation bilaterally, no w/r/r, respirations unlabored without use of accessory muscles.  Heart:  Regular rate and rhythm, S1, S2 normal, no murmur, rub or gallop. Carotids 2+ without bruit.  Abdomen:   Soft, non-tender, non distended. Bowel sounds active all four quadrants,  no masses, no organomegaly.  Extremities: Extremities normal, atraumatic, no cyanosis or edema  Pulses: 2+ and symmetric  Skin: Skin color, texture, turgor normal, no rashes or lesions  Neurologic: Normal affect, no gross deficits.   Results for orders placed during the hospital encounter of 07/02/11 (from the past 48 hour(s))  PLATELET INHIBITION P2Y12     Status: Abnormal   Collection Time   07/02/11  7:01 AM      Component Value Range Comment   Platelet Function  P2Y12 144 (*) 194 - 418 (PRU)    No results found.  Assessment/Plan Rt ICA stenosis For angiogram with possible angioplasty/stent today. Has been taking Plavix and ASA. Reviewed procedure and risks. Consent signed in chart.  Brayton El PA-C 07/02/2011, 8:03 AM

## 2011-07-02 NOTE — Progress Notes (Signed)
  Subjective: Post procedure. More awake alert.. Denies any specific neurological symptoms of N,V,H/As,speech,motor or sensory dysfunction.. Handling liquidsPO.Marland Kitchen No chest pains,sob. On Iv heparin.  Objective: Vital signs in last 24 hours: Temp:  [97.7 F (36.5 C)-98.1 F (36.7 C)] 97.7 F (36.5 C) (05/02 1400) Pulse Rate:  [61-81] 69  (05/02 1530) Resp:  [8-21] 16  (05/02 1530) BP: (110-129)/(53-73) 129/55 mmHg (05/02 1530) SpO2:  [94 %-99 %] 99 % (05/02 1530) Weight:  [191 lb 9.3 oz (86.9 kg)] 191 lb 9.3 oz (86.9 kg) (05/02 1400)    Intake/Output from previous day:   Intake/Output this shift: Total I/O In: 20.5 [I.V.:20.5] Out: 550 [Urine:500; Blood:50]  O/E In no acute distress. VS. BP 125/70.HtRate 60s-70s SR.PaO2 97 % RA .  Neuro. Alert, awake oriented to time ,place and space.Marland Kitchen  Speech and comprehension clear.Marland Kitchen  PEARLA..3mm R=L  EOMS full.Jill Alexand[place].Full to confrontation  No Facial asymmetry.  Tongue Midline..  Motor..           NO drift of outstretched arms..          Power.5/5 Proximally and distally all four extremities..  Fine motor and coordination to finger to nose equal..  Gait  Not tested  Romberg Not tested  Heel to toe. Not tested. Both groins soft.No palpable hematoma. Pulses 2+  Lab Results:  No results found for this basename: WBC:2,HGB:2,HCT:2,PLT:2 in the last 72 hours BMET No results found for this basename: NA:2,K:2,CL:2,CO2:2,GLUCOSE:2,BUN:2,CREATININE:2,CALCIUM:2 in the last 72 hours PT/INR No results found for this basename: LABPROT:2,INR:2 in the last 72 hours ABG No results found for this basename: PHART:2,PCO2:2,PO2:2,HCO3:2 in the last 72 hours  Studies/Results: No results found.  Anti-infectives: Anti-infectives    None      Assessment/Plan: s/p Rt CCA-ICA stent assisted angioplasty for symptomatic stenosis.with distal protection  Plan . Continue IV heparin. Close BP monitoring  Advance diet  as tolorated. Discussed with patient and family.  Owen Pratte K 07/02/2011

## 2011-07-02 NOTE — Anesthesia Postprocedure Evaluation (Signed)
  Anesthesia Post-op Note  Patient: Andrew Malone  Procedure(s) Performed: Procedure(s) (LRB): RADIOLOGY WITH ANESTHESIA (Right)  Patient Location: PACU  Anesthesia Type: MAC  Level of Consciousness: awake, alert  and oriented  Airway and Oxygen Therapy: Patient Spontanous Breathing  Post-op Pain: none  Post-op Assessment: Post-op Vital signs reviewed  Post-op Vital Signs: Reviewed  Complications: No apparent anesthesia complications

## 2011-07-02 NOTE — Progress Notes (Signed)
ANTICOAGULATION CONSULT NOTE  Heparin level =0.45 on 850 units/hr Goal heparin level = 0.1--0.25  Heparin level in higher than desired. Will decrease rate to 700 units/hr. Heparin to be d/c'd in AM at 0700.  Cardell Peach, PharmD

## 2011-07-02 NOTE — Progress Notes (Signed)
ANTICOAGULATION CONSULT NOTE - Initial Consult  Pharmacy Consult for Heparin Indication: s/p carotid stenting  No Known Allergies  Patient Measurements:   Heparin Dosing Weight: 87.2 kg  Vital Signs: Temp: 98 F (36.7 C) (05/02 0700) Temp src: Oral (05/02 0700) BP: 127/73 mmHg (05/02 0700) Pulse Rate: 81  (05/02 0700)  Labs:  Basename 06/29/11 1500  HGB 14.5  HCT 42.8  PLT 195  APTT 24  LABPROT 12.9  INR 0.95  HEPARINUNFRC --  CREATININE 0.98  CKTOTAL --  CKMB --  TROPONINI --   The CrCl is unknown because both a height and weight (above a minimum accepted value) are required for this calculation.  Medical History: Past Medical History  Diagnosis Date  . ANXIETY 12/27/2009  . CAD 12/30/2009  . CAROTID ARTERY DISEASE 11/26/2009  . CVA 12/11/2006  . DEPRESSION 12/27/2009  . GERD 12/11/2006  . GLOMERULONEPHRITIS 12/11/2006  . HIATAL HERNIA, HX OF 12/11/2006  . HYPERLIPIDEMIA 12/27/2009  . Allergy     seasonal  . Cancer     skin  . Heart murmur   . Ulcer 1994    gastric  . OSA (obstructive sleep apnea) 09/26/2010    [facility]    sleep study 2012  . HYPERTENSION 12/11/2006    dr Debby Bud   [facility]     dr Jens Som  cardiac    Medications:  Infusions:     . heparin 850 Units/hr (07/02/11 1244)    Assessment: S/p carotid stenting:  To continue anticoagulation with Heparin with a lower heparin level goal of 0.1-0.25.  It is not likely that his current rate of 500 units/hr would achieve a therapeutic heparin level.  Goal of Therapy:  Heparin level 0.1-0.25   Plan:  Increase Heparin to 850 units/hr Check Heparin level in 6 hours Order repeat levels as needed as Heparin is set to stop at 0700 5/3.  Estella Husk, Pharm.D., BCPS Clinical Pharmacist  Pager [phone] 07/02/2011, 12:48 PM

## 2011-07-02 NOTE — Anesthesia Preprocedure Evaluation (Addendum)
Anesthesia Evaluation  Patient identified by MRN, date of birth, ID band Patient awake    Reviewed: Allergy & Precautions, H&P , NPO status , Patient's Chart, lab work & pertinent test results, reviewed documented beta blocker date and time   History of Anesthesia Complications Negative for: history of anesthetic complications  Airway Mallampati: I TM Distance: >3 FB Neck ROM: Full    Dental  (+) Partial Upper, Dental Advisory Given and Teeth Intact   Pulmonary shortness of breath and with exertion, sleep apnea and Continuous Positive Airway Pressure Ventilation , former smoker cough breath sounds clear to auscultation        Cardiovascular hypertension, Pt. on medications + CAD and + Peripheral Vascular Disease + Valvular Problems/Murmurs AS Rhythm:Regular Rate:Normal  Echo 2011- EF 55-60%, moderate LVH, mild AS   Neuro/Psych PSYCHIATRIC DISORDERS Anxiety Depression Carotid doppler 06/26/11- RICA 80-99% stenosis, LICA 40-59% stenosis    GI/Hepatic Neg liver ROS, hiatal hernia, GERD-  Medicated and Controlled,  Endo/Other  negative endocrine ROS  Renal/GU negative Renal ROSHx glomerulonephritis  negative genitourinary   Musculoskeletal negative musculoskeletal ROS (+)   Abdominal   Peds  Hematology Took aspirin and plavix 07/02/11   Anesthesia Other Findings   Reproductive/Obstetrics negative OB ROS                         Anesthesia Physical Anesthesia Plan  ASA: III  Anesthesia Plan: General   Post-op Pain Management:    Induction: Intravenous  Airway Management Planned: Oral ETT  Additional Equipment: Arterial line  Intra-op Plan:   Post-operative Plan: Extubation in OR  Informed Consent:   Dental advisory given  Plan Discussed with: CRNA, Anesthesiologist and Surgeon  Anesthesia Plan Comments:         Anesthesia Quick Evaluation

## 2011-07-02 NOTE — Procedures (Signed)
S/P rt carotid arteriogram,followed by stent assisted angioplasty of symptomatic 90 % stenosis of the rt ICA stenosis extracranially,with distal protection

## 2011-07-03 ENCOUNTER — Telehealth (HOSPITAL_COMMUNITY): Payer: Self-pay | Admitting: Radiology

## 2011-07-03 ENCOUNTER — Other Ambulatory Visit (HOSPITAL_COMMUNITY): Payer: Self-pay | Admitting: Interventional Radiology

## 2011-07-03 DIAGNOSIS — I6529 Occlusion and stenosis of unspecified carotid artery: Secondary | ICD-10-CM

## 2011-07-03 LAB — CBC
HCT: 37.1 % — ABNORMAL LOW (ref 39.0–52.0)
MCH: 29.2 pg (ref 26.0–34.0)
MCV: 85.9 fL (ref 78.0–100.0)
RBC: 4.32 MIL/uL (ref 4.22–5.81)
RDW: 13.3 % (ref 11.5–15.5)
WBC: 7 10*3/uL (ref 4.0–10.5)

## 2011-07-03 LAB — DIFFERENTIAL
Eosinophils Absolute: 0.2 10*3/uL (ref 0.0–0.7)
Eosinophils Relative: 3 % (ref 0–5)
Lymphocytes Relative: 19 % (ref 12–46)
Lymphs Abs: 1.4 10*3/uL (ref 0.7–4.0)
Monocytes Absolute: 0.7 10*3/uL (ref 0.1–1.0)

## 2011-07-03 LAB — BASIC METABOLIC PANEL
CO2: 29 mEq/L (ref 19–32)
Calcium: 8.9 mg/dL (ref 8.4–10.5)
Creatinine, Ser: 1.02 mg/dL (ref 0.50–1.35)
Glucose, Bld: 121 mg/dL — ABNORMAL HIGH (ref 70–99)

## 2011-07-03 MED ORDER — ASPIRIN 81 MG PO TBEC: 325.0000 mg | DELAYED_RELEASE_TABLET | Freq: Every day | ORAL | Status: DC

## 2011-07-03 NOTE — Progress Notes (Signed)
Patient prepared for discharge.  Patient's neurologic assessment WDL.  Patient tolerating diet and increased ambulation.  PIVs removed, pressure dressings removed and bandaids applied to bilateral groin sites, no active bleeding observed.  Vital signs stable. MD assessed patient and order placed to discharge patient. Patient educated on discharge instructions and new medications, no prescriptions necessary. Patient verbalized understanding of discharge instructions. Patient discharged home with son at 15.

## 2011-07-03 NOTE — Progress Notes (Signed)
Subjective: Pt feels ok. Not much rest but no new c/o. No specific neuro c/o, visual changes, slurred speech, extremity sxs. Tol reg diet, voiding well. No CP, SOB. Heparin off Has received Plavix and ASA this am.  Objective: Physical Exam: BP 124/59  Pulse 63  Temp(Src) 97.4 F (36.3 C) (Oral)  Resp 19  Ht 5\' 10"  (1.778 m)  Wt 191 lb 9.3 oz (86.9 kg)  BMI 27.49 kg/m2  SpO2 96% NEck: soft, NT Lungs: CTA Heart: Reg WUJ:WJXB groins soft.No palpable hematoma. Pulses 2+ PT Neuro:  Alert, awake oriented to time ,place and space.Marland Kitchen  Speech and comprehension clear.Marland Kitchen  PEARLA..3mm R=L  EOMS full.Jill Alexanders Fields.Full to confrontation  No Facial asymmetry.  Tongue Midline..  Motor..  No drift.  Power.5/5 Proximally and distally all four extremities..  Fine motor and coordination to finger to nose equal..   Labs: CBC  Basename 07/03/11 0526  WBC 7.0  HGB 12.6*  HCT 37.1*  PLT 166   BMET  Basename 07/03/11 0526  NA 140  K 4.1  CL 105  CO2 29  GLUCOSE 121*  BUN 13  CREATININE 1.02  CALCIUM 8.9   LFT No results found for this basename: PROT,ALBUMIN,AST,ALT,ALKPHOS,BILITOT,BILIDIR,IBILI,LIPASE in the last 72 hours PT/INR No results found for this basename: LABPROT:2,INR:2 in the last 72 hours   Studies/Results: No results found.  Assessment/Plan: s/p Rt CCA-ICA stent assisted angioplasty for symptomatic stenosis.with distal protection OOB Remove soft collar Planning for DC later this am.    LOS: 1 day    Brayton El PA-C 07/03/2011 8:59 AM

## 2011-07-03 NOTE — Discharge Summary (Signed)
Physician Discharge Summary  [name]: Andrew Malone MRN: [id number] DOB/AGE: 1941/01/14 71 y.o.  Admit date: 07/02/2011 Discharge date: 07/03/2011  Admission Diagnoses: Right Internal Carotid Artery Stenosis 80-99%  Discharge Diagnoses:  Right Internal Carotid Artery Stenosis 80-99%    Procedures: Procedure(s): Rt Carotid arteriogram with stent angioplasty of RT ICA with distal protection.   Discharged Condition: good  Hospital Course: HPI: Andrew Malone is an 71 y.o. male with history of coronary artery  disease and cerebrovascular disease who had a routine follow-up  carotid Doppler study performed on 06/22/2011. In comparison with  prior study performed in 12/2010 there was an increase in peak  systolic velocity as well as stenosis on the right side;  specifically documented as 80-99% RICA stenosis. The patient has  had one single episode of right visual disturbance that lasted for  approximately 15-20 minutes. He describes it as a circular visual  field loss that resolved spontaneously with no further episodes.  The patient denies any headaches, aura, nausea or vomiting  associated with his visual disturbance. His left eye vision was  not changed during this episode. The only other symptom the  patient complains of is dyspnea on exertion only with stair  climbing which is resolved with rest.  He was seen by Dr. Corliss Skains and scheduled for cerebral angiogram with possible angioplasty and stenting of RT ICA.  He was taken to the IR suite on 5/2 and underwent successful R ICA stent angioplasty by Dr. Corliss Skains. No significant intra-op complications encountered. He was admitted to the floor in stable condition. Post-operatively, his sheaths were removed without complication. His heparin was discontinued as scheduled and he was given both ASA and Plavix. His post-op neuro exam is normal and the pt did very well. He has been OOB, tolerated diet, and there are no issues with his  groin sites. We have discussed his precautions and discharge instructions as well as follow up plan. He is instructed to take his Plavix and ASA without fail. He will resume his other home medications. He is to follow up in 2 weeks but is instructed to call immediately if he has any questions or concerns.  Consults: None   Discharge Exam: Blood pressure 122/53, pulse 84, temperature 97.4 F (36.3 C), temperature source Oral, resp. rate 17, height 5\' 10"  (1.778 m), weight 191 lb 9.3 oz (86.9 kg), SpO2 98.00%. NEck: soft, NT  Lungs: CTA  Heart: Reg  GEX:BMWU groins soft.No palpable hematoma. Pulses 2+ PT  Neuro:  Alert, awake oriented to time ,place and space.Marland Kitchen  Speech and comprehension clear.Marland Kitchen  PEARLA..3mm R=L  EOMS full.Jill Alexanders Fields.Full to confrontation  No Facial asymmetry.  Tongue Midline..  Motor..  No drift.  Power.5/5 Proximally and distally all four extremities..  Fine motor and coordination to finger to nose equal..    Disposition: To Home  Discharge Orders    Future Orders Please Complete By Expires   Diet - low sodium heart healthy      Increase activity slowly      May walk up steps      No dressing needed      Comments:   May remove bandaid 5/4   Call MD for:  persistant dizziness or light-headedness      Call MD for:  redness, tenderness, or signs of infection (pain, swelling, redness, odor or green/yellow discharge around incision site)      Call MD for:  severe uncontrolled pain      Call  MD for:  persistant nausea and vomiting      Call MD for:      Comments:   Changes in vision, slurred speech, loss of strength or feeling in extremities.   Driving Restrictions      Comments:   No driving for 2 weeks.   Lifting restrictions      Comments:   No heavy lifting more than 10lbs for 2 weeks.     Medication List  As of 07/03/2011 11:39 AM   TAKE these medications         acyclovir 200 MG capsule   Commonly known as: ZOVIRAX   Take 200 mg by mouth  3 (three) times daily as needed. For herpes outbreaks      aspirin 81 MG EC tablet   Take 4 tablets (325 mg total) by mouth daily.      atorvastatin 20 MG tablet   Commonly known as: LIPITOR   Take 20 mg by mouth daily.      clonazePAM 0.5 MG tablet   Commonly known as: KLONOPIN   Take 0.5 mg by mouth 3 (three) times daily as needed. For anxiety      clopidogrel 75 MG tablet   Commonly known as: PLAVIX   Take 75 mg by mouth daily.      hydrochlorothiazide 25 MG tablet   Commonly known as: HYDRODIURIL   Take 25 mg by mouth daily.      HYDROcodone-acetaminophen 7.5-750 MG per tablet   Commonly known as: VICODIN ES   Take 1 tablet by mouth every 6 (six) hours as needed. For pain      methocarbamol 500 MG tablet   Commonly known as: ROBAXIN   Take 500 mg by mouth daily as needed. For pain      pantoprazole 40 MG tablet   Commonly known as: PROTONIX   Take 40 mg by mouth daily.      Tamsulosin HCl 0.4 MG Caps   Commonly known as: FLOMAX   Take 1 capsule by mouth at bedtime.           Follow-up Information    Follow up with Oneal Grout, MD in 2 weeks. (Office will call to schedule)    Contact information:   [address] [place] [address] [phone]          Signed: Brayton El PA-C 07/03/2011, 11:39 AM

## 2011-07-03 NOTE — Progress Notes (Signed)
UR COMPLETED  

## 2011-07-06 ENCOUNTER — Other Ambulatory Visit (HOSPITAL_COMMUNITY): Payer: Self-pay | Admitting: Interventional Radiology

## 2011-07-06 DIAGNOSIS — I6529 Occlusion and stenosis of unspecified carotid artery: Secondary | ICD-10-CM

## 2011-07-07 ENCOUNTER — Telehealth (HOSPITAL_COMMUNITY): Payer: Self-pay

## 2011-07-07 NOTE — Telephone Encounter (Signed)
I spoke with Andrew Malone in regards to Mr. Recker wife wanting to know if he could take a shower.  She stated yes so I called Mr. Bohnet to let him know.

## 2011-07-16 ENCOUNTER — Ambulatory Visit (HOSPITAL_COMMUNITY)
Admission: RE | Admit: 2011-07-16 | Discharge: 2011-07-16 | Disposition: A | Discharge status: Home / Self Care | Payer: Medicare Other | Source: Ambulatory Visit | Attending: Interventional Radiology | Admitting: Interventional Radiology

## 2011-07-16 DIAGNOSIS — I6529 Occlusion and stenosis of unspecified carotid artery: Secondary | ICD-10-CM

## 2011-08-05 ENCOUNTER — Inpatient Hospital Stay (HOSPITAL_COMMUNITY)
Admission: EM | Admit: 2011-08-05 | Discharge: 2011-08-07 | DRG: 066 | Disposition: A | Discharge status: Home / Self Care | Payer: Medicare Other | Source: Ambulatory Visit | Attending: Neurology | Admitting: Neurology

## 2011-08-05 ENCOUNTER — Other Ambulatory Visit (HOSPITAL_COMMUNITY): Payer: Self-pay | Admitting: Interventional Radiology

## 2011-08-05 ENCOUNTER — Ambulatory Visit (HOSPITAL_COMMUNITY): Admission: RE | Admit: 2011-08-05 | Payer: Medicare Other | Source: Ambulatory Visit

## 2011-08-05 ENCOUNTER — Ambulatory Visit (HOSPITAL_COMMUNITY)
Admission: RE | Admit: 2011-08-05 | Discharge: 2011-08-05 | Disposition: A | Discharge status: Home / Self Care | Payer: Medicare Other | Source: Ambulatory Visit | Attending: Interventional Radiology | Admitting: Interventional Radiology

## 2011-08-05 ENCOUNTER — Encounter (HOSPITAL_COMMUNITY): Payer: Self-pay | Admitting: Neurology

## 2011-08-05 DIAGNOSIS — Z9861 Coronary angioplasty status: Secondary | ICD-10-CM | POA: Diagnosis not present

## 2011-08-05 DIAGNOSIS — G8929 Other chronic pain: Secondary | ICD-10-CM

## 2011-08-05 DIAGNOSIS — Z833 Family history of diabetes mellitus: Secondary | ICD-10-CM | POA: Diagnosis not present

## 2011-08-05 DIAGNOSIS — R5381 Other malaise: Secondary | ICD-10-CM | POA: Diagnosis not present

## 2011-08-05 DIAGNOSIS — I6529 Occlusion and stenosis of unspecified carotid artery: Secondary | ICD-10-CM

## 2011-08-05 DIAGNOSIS — Z79899 Other long term (current) drug therapy: Secondary | ICD-10-CM | POA: Diagnosis not present

## 2011-08-05 DIAGNOSIS — K219 Gastro-esophageal reflux disease without esophagitis: Secondary | ICD-10-CM | POA: Diagnosis present

## 2011-08-05 DIAGNOSIS — I10 Essential (primary) hypertension: Secondary | ICD-10-CM | POA: Diagnosis not present

## 2011-08-05 DIAGNOSIS — Z8249 Family history of ischemic heart disease and other diseases of the circulatory system: Secondary | ICD-10-CM

## 2011-08-05 DIAGNOSIS — Z803 Family history of malignant neoplasm of breast: Secondary | ICD-10-CM | POA: Diagnosis not present

## 2011-08-05 DIAGNOSIS — I639 Cerebral infarction, unspecified: Secondary | ICD-10-CM

## 2011-08-05 DIAGNOSIS — I6789 Other cerebrovascular disease: Secondary | ICD-10-CM | POA: Diagnosis not present

## 2011-08-05 DIAGNOSIS — I369 Nonrheumatic tricuspid valve disorder, unspecified: Secondary | ICD-10-CM | POA: Diagnosis not present

## 2011-08-05 DIAGNOSIS — Z7902 Long term (current) use of antithrombotics/antiplatelets: Secondary | ICD-10-CM | POA: Diagnosis not present

## 2011-08-05 DIAGNOSIS — I6340 Cerebral infarction due to embolism of unspecified cerebral artery: Secondary | ICD-10-CM | POA: Diagnosis not present

## 2011-08-05 DIAGNOSIS — Z7982 Long term (current) use of aspirin: Secondary | ICD-10-CM | POA: Diagnosis not present

## 2011-08-05 DIAGNOSIS — Z801 Family history of malignant neoplasm of trachea, bronchus and lung: Secondary | ICD-10-CM | POA: Diagnosis not present

## 2011-08-05 DIAGNOSIS — M1990 Unspecified osteoarthritis, unspecified site: Secondary | ICD-10-CM

## 2011-08-05 DIAGNOSIS — F419 Anxiety disorder, unspecified: Secondary | ICD-10-CM

## 2011-08-05 DIAGNOSIS — E785 Hyperlipidemia, unspecified: Secondary | ICD-10-CM | POA: Diagnosis present

## 2011-08-05 DIAGNOSIS — G4733 Obstructive sleep apnea (adult) (pediatric): Secondary | ICD-10-CM | POA: Diagnosis present

## 2011-08-05 DIAGNOSIS — Z87891 Personal history of nicotine dependence: Secondary | ICD-10-CM

## 2011-08-05 DIAGNOSIS — F341 Dysthymic disorder: Secondary | ICD-10-CM | POA: Diagnosis present

## 2011-08-05 DIAGNOSIS — I6350 Cerebral infarction due to unspecified occlusion or stenosis of unspecified cerebral artery: Secondary | ICD-10-CM | POA: Diagnosis not present

## 2011-08-05 DIAGNOSIS — R42 Dizziness and giddiness: Secondary | ICD-10-CM | POA: Diagnosis not present

## 2011-08-05 DIAGNOSIS — R269 Unspecified abnormalities of gait and mobility: Secondary | ICD-10-CM | POA: Diagnosis present

## 2011-08-05 DIAGNOSIS — I2510 Atherosclerotic heart disease of native coronary artery without angina pectoris: Secondary | ICD-10-CM | POA: Diagnosis present

## 2011-08-05 DIAGNOSIS — R2681 Unsteadiness on feet: Secondary | ICD-10-CM | POA: Diagnosis present

## 2011-08-05 DIAGNOSIS — G464 Cerebellar stroke syndrome: Secondary | ICD-10-CM

## 2011-08-05 DIAGNOSIS — I69993 Ataxia following unspecified cerebrovascular disease: Secondary | ICD-10-CM | POA: Diagnosis not present

## 2011-08-05 HISTORY — DX: Unspecified osteoarthritis, unspecified site: M19.90

## 2011-08-05 HISTORY — DX: Cerebral infarction, unspecified: I63.9

## 2011-08-05 HISTORY — DX: Other complications of anesthesia, initial encounter: T88.59XA

## 2011-08-05 HISTORY — DX: Other chronic pain: G89.29

## 2011-08-05 HISTORY — DX: Anxiety disorder, unspecified: F41.9

## 2011-08-05 HISTORY — DX: Allergy status to unspecified drugs, medicaments and biological substances: Z88.9

## 2011-08-05 HISTORY — DX: Dorsalgia, unspecified: M54.9

## 2011-08-05 HISTORY — DX: Personal history of other diseases of the digestive system: Z87.19

## 2011-08-05 HISTORY — DX: Adverse effect of unspecified anesthetic, initial encounter: T41.45XA

## 2011-08-05 LAB — PROTIME-INR: Prothrombin Time: 12.9 seconds (ref 11.6–15.2)

## 2011-08-05 LAB — BASIC METABOLIC PANEL
Chloride: 102 mEq/L (ref 96–112)
Creatinine, Ser: 1.03 mg/dL (ref 0.50–1.35)
GFR calc Af Amer: 84 mL/min — ABNORMAL LOW (ref >90)
GFR calc non Af Amer: 73 mL/min — ABNORMAL LOW (ref >90)

## 2011-08-05 LAB — DIFFERENTIAL
Basophils Absolute: 0 10*3/uL (ref 0.0–0.1)
Basophils Relative: 0 % (ref 0–1)
Eosinophils Absolute: 0.4 10*3/uL (ref 0.0–0.7)
Monocytes Absolute: 0.8 10*3/uL (ref 0.1–1.0)
Neutro Abs: 3.9 10*3/uL (ref 1.7–7.7)
Neutrophils Relative %: 58 % (ref 43–77)

## 2011-08-05 LAB — CBC
MCHC: 34.6 g/dL (ref 30.0–36.0)
RDW: 12.9 % (ref 11.5–15.5)

## 2011-08-05 MED ORDER — SODIUM CHLORIDE 0.9 % IV SOLN
INTRAVENOUS | Status: DC
  Administered 2011-08-05: 22:00:00 via INTRAVENOUS

## 2011-08-05 MED ORDER — ACETAMINOPHEN 325 MG PO TABS
650.0000 mg | ORAL_TABLET | Freq: Four times a day (QID) | ORAL | Status: DC | PRN
  Administered 2011-08-06: 650 mg via ORAL
  Filled 2011-08-05: qty 2

## 2011-08-05 MED ORDER — ALUM & MAG HYDROXIDE-SIMETH 200-200-20 MG/5ML PO SUSP: 30.0000 mL | Freq: Four times a day (QID) | ORAL | Status: DC | PRN

## 2011-08-05 MED ORDER — CLOPIDOGREL BISULFATE 75 MG PO TABS
75.0000 mg | ORAL_TABLET | Freq: Every day | ORAL | Status: DC
  Administered 2011-08-06 – 2011-08-07 (×2): 75 mg via ORAL
  Filled 2011-08-05 (×2): qty 1

## 2011-08-05 MED ORDER — ACETAMINOPHEN 650 MG RE SUPP: 650.0000 mg | Freq: Four times a day (QID) | RECTAL | Status: DC | PRN

## 2011-08-05 MED ORDER — HYDROCHLOROTHIAZIDE 25 MG PO TABS
25.0000 mg | ORAL_TABLET | Freq: Every day | ORAL | Status: DC
  Administered 2011-08-06 – 2011-08-07 (×2): 25 mg via ORAL
  Filled 2011-08-05 (×2): qty 1

## 2011-08-05 MED ORDER — SODIUM CHLORIDE 0.9 % IV SOLN: INTRAVENOUS | Status: DC

## 2011-08-05 MED ORDER — ASPIRIN EC 81 MG PO TBEC
162.0000 mg | DELAYED_RELEASE_TABLET | Freq: Every day | ORAL | Status: DC
  Administered 2011-08-06 – 2011-08-07 (×2): 162 mg via ORAL
  Filled 2011-08-05 (×2): qty 2

## 2011-08-05 MED ORDER — GADOBENATE DIMEGLUMINE 529 MG/ML IV SOLN
20.0000 mL | Freq: Once | INTRAVENOUS | Status: AC
  Administered 2011-08-05: 20 mL via INTRAVENOUS

## 2011-08-05 MED ORDER — ACYCLOVIR 200 MG PO CAPS
200.0000 mg | ORAL_CAPSULE | Freq: Three times a day (TID) | ORAL | Status: DC | PRN
  Filled 2011-08-05: qty 1

## 2011-08-05 MED ORDER — PANTOPRAZOLE SODIUM 40 MG PO TBEC
40.0000 mg | DELAYED_RELEASE_TABLET | Freq: Every day | ORAL | Status: DC
  Administered 2011-08-06 – 2011-08-07 (×2): 40 mg via ORAL
  Filled 2011-08-05: qty 1

## 2011-08-05 MED ORDER — ONDANSETRON HCL 4 MG/2ML IJ SOLN: 4.0000 mg | Freq: Four times a day (QID) | INTRAMUSCULAR | Status: DC | PRN

## 2011-08-05 MED ORDER — SIMVASTATIN 40 MG PO TABS
40.0000 mg | ORAL_TABLET | Freq: Every day | ORAL | Status: DC
  Administered 2011-08-05 – 2011-08-06 (×2): 40 mg via ORAL
  Filled 2011-08-05 (×3): qty 1

## 2011-08-05 MED ORDER — TAMSULOSIN HCL 0.4 MG PO CAPS
0.4000 mg | ORAL_CAPSULE | Freq: Every day | ORAL | Status: DC
  Administered 2011-08-05 – 2011-08-06 (×2): 0.4 mg via ORAL
  Filled 2011-08-05 (×3): qty 1

## 2011-08-05 MED ORDER — ZOLPIDEM TARTRATE 5 MG PO TABS: 10.0000 mg | ORAL_TABLET | Freq: Every evening | ORAL | Status: DC | PRN

## 2011-08-05 MED ORDER — ONDANSETRON HCL 4 MG PO TABS: 4.0000 mg | ORAL_TABLET | Freq: Four times a day (QID) | ORAL | Status: DC | PRN

## 2011-08-05 MED ORDER — SODIUM CHLORIDE 0.9 % IJ SOLN
3.0000 mL | Freq: Two times a day (BID) | INTRAMUSCULAR | Status: DC
  Administered 2011-08-05 – 2011-08-06 (×2): 3 mL via INTRAVENOUS

## 2011-08-05 NOTE — ED Notes (Signed)
Patient to ED from Radiology department with a complaint of gait disturbance

## 2011-08-05 NOTE — ED Notes (Signed)
Called [phone] to give report. Receiving RN unable to take report at this time. 

## 2011-08-05 NOTE — ED Provider Notes (Signed)
History     CSN: [id number]  Arrival date & time 08/05/11  1633   First MD Initiated Contact with Patient 08/05/11 1634      Chief Complaint  Patient presents with  . Gait Problem    (Consider location/radiation/quality/duration/timing/severity/associated sxs/prior treatment) HPI Comments: Patient had an endarterectomy 5 weeks ago and is on aspirin and Plavix when 4 days ago intermittently he would have difficulty with his balance during walking. He states it was not constant but would be about 80% of the time. The symptoms are only present with walking and resolved while sitting. However today the symptoms worsened and he was unable to walk without holding onto a wall or rail. he denies any falling or recent trauma. There are no head or neck pain. He denies any focal weakness or vision changes. He had an MR/MRA done of the head and neck today which showed new acute cerebellar infarcts.  Patient is a 71 y.o. male presenting with weakness.  Weakness Primary symptoms do not include headaches or fever. Primary symptoms comment: difficulty walking  Additional symptoms do not include weakness. Medical issues also include recent surgery. Workup history includes MRI.    Past Medical History  Diagnosis Date  . ANXIETY 12/27/2009  . CAD 12/30/2009  . CAROTID ARTERY DISEASE 11/26/2009  . CVA 12/11/2006  . DEPRESSION 12/27/2009  . GERD 12/11/2006  . GLOMERULONEPHRITIS 12/11/2006  . HIATAL HERNIA, HX OF 12/11/2006  . HYPERLIPIDEMIA 12/27/2009  . Allergy     seasonal  . Cancer     skin  . Heart murmur   . Ulcer 1994    gastric  . OSA (obstructive sleep apnea) 09/26/2010    [facility]    sleep study 2012  . HYPERTENSION 12/11/2006    dr Debby Bud   [facility]     dr Jens Som  cardiac    Past Surgical History  Procedure Date  . Inguinal herniorrhapy   . Cardiac catheterization     07    Family History  Problem Relation Age of Onset  . Heart attack Mother   . Hypertension Mother   .  Hyperlipidemia Mother   . Diabetes Mother   . Coronary artery disease Mother   . Hypertension Sister   . Hyperlipidemia Sister   . Diabetes Sister   . Diabetes Brother     severe  . Emphysema Brother   . Lung cancer Sister   . Lung cancer Father   . Breast cancer Sister     History  Substance Use Topics  . Smoking status: Never Smoker   . Smokeless tobacco: Former Neurosurgeon    Types: Chew  . Alcohol Use: No      Review of Systems  Constitutional: Negative for fever and chills.  HENT: Negative for neck pain.   Eyes: Negative for visual disturbance.  Neurological: Negative for syncope, speech difficulty, weakness, light-headedness, numbness and headaches.       Difficulty walking and feeling off balance  All other systems reviewed and are negative.    Allergies  Review of patient's allergies indicates no known allergies.  Home Medications   Current Outpatient Rx  Name Route Sig Dispense Refill  . ACYCLOVIR 200 MG PO CAPS Oral Take 200 mg by mouth 3 (three) times daily as needed. For herpes outbreaks    . ASPIRIN 81 MG PO TBEC Oral Take 4 tablets (325 mg total) by mouth daily. 30 tablet 0  . ATORVASTATIN CALCIUM 20 MG PO TABS Oral Take 20 mg  by mouth daily.    Marland Kitchen CLONAZEPAM 0.5 MG PO TABS Oral Take 0.5 mg by mouth 3 (three) times daily as needed. For anxiety    . CLOPIDOGREL BISULFATE 75 MG PO TABS Oral Take 75 mg by mouth daily.    Marland Kitchen HYDROCHLOROTHIAZIDE 25 MG PO TABS Oral Take 25 mg by mouth daily.    Marland Kitchen HYDROCODONE-ACETAMINOPHEN 7.5-750 MG PO TABS Oral Take 1 tablet by mouth every 6 (six) hours as needed. For pain    . METHOCARBAMOL 500 MG PO TABS Oral Take 500 mg by mouth daily as needed. For pain    . PANTOPRAZOLE SODIUM 40 MG PO TBEC Oral Take 40 mg by mouth daily.    Marland Kitchen TAMSULOSIN HCL 0.4 MG PO CAPS Oral Take 1 capsule by mouth at bedtime.      BP 147/83  Pulse 69  Temp(Src) 97.6 F (36.4 C) (Oral)  Resp 18  SpO2 98%  Physical Exam  Nursing note and vitals  reviewed. Constitutional: He is oriented to person, place, and time. He appears well-developed and well-nourished. No distress.  HENT:  Head: Normocephalic and atraumatic.  Mouth/Throat: Oropharynx is clear and moist.  Eyes: Conjunctivae and EOM are normal. Pupils are equal, round, and reactive to light.  Neck: Normal range of motion. Neck supple.  Cardiovascular: Normal rate, regular rhythm and intact distal pulses.   Murmur heard.  Crescendo systolic murmur is present with a grade of 3/6       Heard best at the LSB  Pulmonary/Chest: Effort normal and breath sounds normal. No respiratory distress. He has no wheezes. He has no rales.  Abdominal: Soft. He exhibits no distension. There is no tenderness. There is no rebound and no guarding.  Musculoskeletal: Normal range of motion. He exhibits no edema and no tenderness.  Neurological: He is alert and oriented to person, place, and time. He has normal strength. No cranial nerve deficit or sensory deficit.       Ataxic gait  Skin: Skin is warm and dry. No rash noted. No erythema.  Psychiatric: He has a normal mood and affect. His behavior is normal.    ED Course  Procedures (including critical care time)  Labs Reviewed - No data to display Mr Maxine Glenn Head Wo Contrast  08/05/2011  *RADIOLOGY REPORT*  Clinical Data:  Status post stent assisted angioplasty of the right internal carotid artery 07/02/2011. New onset dizziness.  History of hypertension.  MRI HEAD WITHOUT CONTRAST MRA HEAD WITHOUT CONTRAST MRA NECK WITHOUT AND WITH CONTRAST  Technique:  Multiplanar, multiecho pulse sequences of the brain and surrounding structures were obtained without intravenous contrast. Angiographic images of the Circle of Willis were obtained using MRA technique without intravenous contrast.  Angiographic images of the neck were obtained using MRA technique without and with intravenous contrast.  Carotid stenosis measurements (when applicable) are obtained utilizing  NASCET criteria, using the distal internal carotid diameter as the denominator.  Comparison:  Cerebral angiogram 07/02/2011.  MRI brain and MRA head 08/17/2006.  Contrast:  MultiHance 20 ml  MRI HEAD  Findings:  Bilateral areas of subcentimeter acute infarction affect the right posterior frontal cortex/subcortical white matter (one lesion), and left periventricular white matter (two lesions).  The intensity of restricted diffusion demonstrated on DWI suggests that these lesions are acute, within the past few days, and unlikely to be residual from previous therapeutic intervention 07/02/2011. There is no hemorrhage, large vessel infarct, mass lesion, hydrocephalus, or extra-axial fluid.  Mild atrophy is present.  No  significant white matter disease.  No foci of chronic hemorrhage.  No lacunar infarcts.  Major intracranial vascular structures patent.  Normal pituitary and cerebellar tonsils.  No acute sinus or mastoid disease.  IMPRESSION: Bilateral areas of subcentimeter acute infarction affecting the supratentorial region as described. Question embolic strokes versus hypoperfusion event.  No areas of hemorrhage are associated.  MRA HEAD  Findings: The internal carotid arteries are widely patent.  The basilar artery is slightly hypoplastic and supplied solely by the left vertebral.  The right vertebral ends in PICA.  There is no intracranial stenosis or aneurysm.  The internal carotid artery caliber is equal in size bilaterally.  IMPRESSION: Unremarkable MR angiography intracranial circulation.  MRA NECK  Findings: There is extensive signal drop out related to the recently placed stent across the right internal carotid artery stenosis.  Both the right distal common carotid artery as well as the mid cervical to distal cervical RICA are widely patent implying there is no intrastent stenosis.  Left carotid bifurcation demonstrates mild nonstenotic atheromatous change posteriorly.  No cervical ICA dissection.  Both  vertebrals widely patent with the left dominant.  IMPRESSION: Signal drop out related to placement of the right internal carotid artery stent but no evidence for dissection or stenosis related to recent stent placement.  Both cervical internal carotid arteries are widely patent.  If there is concern regarding possible intrastent stenosis, CTA neck or formal catheter angiogram may be necessary for further evaluation.  Original Report Authenticated By: Elsie Stain, M.D.   Mr Angiogram Neck W Wo Contrast  08/05/2011  *RADIOLOGY REPORT*  Clinical Data:  Status post stent assisted angioplasty of the right internal carotid artery 07/02/2011. New onset dizziness.  History of hypertension.  MRI HEAD WITHOUT CONTRAST MRA HEAD WITHOUT CONTRAST MRA NECK WITHOUT AND WITH CONTRAST  Technique:  Multiplanar, multiecho pulse sequences of the brain and surrounding structures were obtained without intravenous contrast. Angiographic images of the Circle of Willis were obtained using MRA technique without intravenous contrast.  Angiographic images of the neck were obtained using MRA technique without and with intravenous contrast.  Carotid stenosis measurements (when applicable) are obtained utilizing NASCET criteria, using the distal internal carotid diameter as the denominator.  Comparison:  Cerebral angiogram 07/02/2011.  MRI brain and MRA head 08/17/2006.  Contrast:  MultiHance 20 ml  MRI HEAD  Findings:  Bilateral areas of subcentimeter acute infarction affect the right posterior frontal cortex/subcortical white matter (one lesion), and left periventricular white matter (two lesions).  The intensity of restricted diffusion demonstrated on DWI suggests that these lesions are acute, within the past few days, and unlikely to be residual from previous therapeutic intervention 07/02/2011. There is no hemorrhage, large vessel infarct, mass lesion, hydrocephalus, or extra-axial fluid.  Mild atrophy is present.  No significant white  matter disease.  No foci of chronic hemorrhage.  No lacunar infarcts.  Major intracranial vascular structures patent.  Normal pituitary and cerebellar tonsils.  No acute sinus or mastoid disease.  IMPRESSION: Bilateral areas of subcentimeter acute infarction affecting the supratentorial region as described. Question embolic strokes versus hypoperfusion event.  No areas of hemorrhage are associated.  MRA HEAD  Findings: The internal carotid arteries are widely patent.  The basilar artery is slightly hypoplastic and supplied solely by the left vertebral.  The right vertebral ends in PICA.  There is no intracranial stenosis or aneurysm.  The internal carotid artery caliber is equal in size bilaterally.  IMPRESSION: Unremarkable MR angiography intracranial circulation.  MRA NECK  Findings: There is extensive signal drop out related to the recently placed stent across the right internal carotid artery stenosis.  Both the right distal common carotid artery as well as the mid cervical to distal cervical RICA are widely patent implying there is no intrastent stenosis.  Left carotid bifurcation demonstrates mild nonstenotic atheromatous change posteriorly.  No cervical ICA dissection.  Both vertebrals widely patent with the left dominant.  IMPRESSION: Signal drop out related to placement of the right internal carotid artery stent but no evidence for dissection or stenosis related to recent stent placement.  Both cervical internal carotid arteries are widely patent.  If there is concern regarding possible intrastent stenosis, CTA neck or formal catheter angiogram may be necessary for further evaluation.  Original Report Authenticated By: Elsie Stain, M.D.   Mr Brain Wo Contrast  08/05/2011  *RADIOLOGY REPORT*  Clinical Data:  Status post stent assisted angioplasty of the right internal carotid artery 07/02/2011. New onset dizziness.  History of hypertension.  MRI HEAD WITHOUT CONTRAST MRA HEAD WITHOUT CONTRAST MRA NECK  WITHOUT AND WITH CONTRAST  Technique:  Multiplanar, multiecho pulse sequences of the brain and surrounding structures were obtained without intravenous contrast. Angiographic images of the Circle of Willis were obtained using MRA technique without intravenous contrast.  Angiographic images of the neck were obtained using MRA technique without and with intravenous contrast.  Carotid stenosis measurements (when applicable) are obtained utilizing NASCET criteria, using the distal internal carotid diameter as the denominator.  Comparison:  Cerebral angiogram 07/02/2011.  MRI brain and MRA head 08/17/2006.  Contrast:  MultiHance 20 ml  MRI HEAD  Findings:  Bilateral areas of subcentimeter acute infarction affect the right posterior frontal cortex/subcortical white matter (one lesion), and left periventricular white matter (two lesions).  The intensity of restricted diffusion demonstrated on DWI suggests that these lesions are acute, within the past few days, and unlikely to be residual from previous therapeutic intervention 07/02/2011. There is no hemorrhage, large vessel infarct, mass lesion, hydrocephalus, or extra-axial fluid.  Mild atrophy is present.  No significant white matter disease.  No foci of chronic hemorrhage.  No lacunar infarcts.  Major intracranial vascular structures patent.  Normal pituitary and cerebellar tonsils.  No acute sinus or mastoid disease.  IMPRESSION: Bilateral areas of subcentimeter acute infarction affecting the supratentorial region as described. Question embolic strokes versus hypoperfusion event.  No areas of hemorrhage are associated.  MRA HEAD  Findings: The internal carotid arteries are widely patent.  The basilar artery is slightly hypoplastic and supplied solely by the left vertebral.  The right vertebral ends in PICA.  There is no intracranial stenosis or aneurysm.  The internal carotid artery caliber is equal in size bilaterally.  IMPRESSION: Unremarkable MR angiography  intracranial circulation.  MRA NECK  Findings: There is extensive signal drop out related to the recently placed stent across the right internal carotid artery stenosis.  Both the right distal common carotid artery as well as the mid cervical to distal cervical RICA are widely patent implying there is no intrastent stenosis.  Left carotid bifurcation demonstrates mild nonstenotic atheromatous change posteriorly.  No cervical ICA dissection.  Both vertebrals widely patent with the left dominant.  IMPRESSION: Signal drop out related to placement of the right internal carotid artery stent but no evidence for dissection or stenosis related to recent stent placement.  Both cervical internal carotid arteries are widely patent.  If there is concern regarding possible intrastent stenosis, CTA neck or  formal catheter angiogram may be necessary for further evaluation.  Original Report Authenticated By: Elsie Stain, M.D.     Date: 08/05/2011  Rate: 70  Rhythm: normal sinus rhythm  QRS Axis: normal  Intervals: normal  ST/T Wave abnormalities: normal  Conduction Disutrbances:nonspecific intraventricular conduction delay  Narrative Interpretation:   Old EKG Reviewed: unchanged   1. Cerebellar stroke syndrome       MDM   Patient with difficulty with his gait for the last 4 days which is gradually getting worse. He had an endarterectomy done 5 weeks ago with stent placement is been on Plavix and aspirin. Patient had an MR today of his brain showing new infarcts in the cerebellar region. While he is sitting he is asymptomatic but when he gets up and tries to walk he is ataxic. Will admit for further evaluation.        Gwyneth Sprout, MD 08/05/11 1731

## 2011-08-05 NOTE — Progress Notes (Deleted)
Attempted to call Andrew Malone in ED for report. No answer at this time. Will retry in 15 minutes.

## 2011-08-05 NOTE — ED Notes (Signed)
Patient C/O gait disturbance for 1 week. C/O feeling hot also. He states that the symptoms were intermittent. Reports that he had a right carotid stent places a few weeks ago. PERRL - 3mm,

## 2011-08-05 NOTE — ED Notes (Signed)
Transported to room 3711 with monitor

## 2011-08-05 NOTE — H&P (Signed)
Admission H&P    Chief Complaint: Dizziness  HPI: Andrew Malone is an 71 y.o. male who underwent stent placement approximately 6 weeks ago by Dr. Corliss Skains.  Patient was placed on ASA and Plavix and did well until Thursday when he had an episode of being off balance that lasted about one minute.  Seemed to go back to baseline until Saturday when he had this sensation return.  It was mild and patient was able to continue with his ADL's.  This continued until today when he symptoms became acutely worse.  Patient presented at that time.  MRI of the brain was performed and shows bilateral subcentimeter areas of acute infarction.  MRA of the head and neck were unremarkable but due to artifact from the stent this area was not able to be evaluated well. It was determined at that time that the patient would be appropriate for admission and selective angio in the morning.    Past Medical History  Diagnosis Date  . ANXIETY 12/27/2009  . CAD 12/30/2009  . CAROTID ARTERY DISEASE 11/26/2009  . CVA 12/11/2006  . DEPRESSION 12/27/2009  . GERD 12/11/2006  . GLOMERULONEPHRITIS 12/11/2006  . HIATAL HERNIA, HX OF 12/11/2006  . HYPERLIPIDEMIA 12/27/2009  . Allergy     seasonal  . Cancer     skin  . Heart murmur   . Ulcer 1994    gastric  . OSA (obstructive sleep apnea) 09/26/2010    [facility]    sleep study 2012  . HYPERTENSION 12/11/2006    dr Debby Bud   [facility]     dr Jens Som  cardiac    Past Surgical History  Procedure Date  . Inguinal herniorrhapy   . Cardiac catheterization     07    Family History  Problem Relation Age of Onset  . Heart attack Mother   . Hypertension Mother   . Hyperlipidemia Mother   . Diabetes Mother   . Coronary artery disease Mother   . Hypertension Sister   . Hyperlipidemia Sister   . Diabetes Sister   . Diabetes Brother     severe  . Emphysema Brother   . Lung cancer Sister   . Lung cancer Father   . Breast cancer Sister    Social History:  reports that he has  never smoked. He has quit using smokeless tobacco. His smokeless tobacco use included Chew. He reports that he does not drink alcohol or use illicit drugs.  Allergies: No Known Allergies   (Not in a hospital admission)  ROS: History obtained from the patient  General ROS: negative for - chills, fatigue, fever, night sweats, weight gain or weight loss Psychological ROS: negative for - behavioral disorder, hallucinations, memory difficulties, mood swings or suicidal ideation Ophthalmic ROS: negative for - blurry vision, double vision, eye pain or loss of vision ENT ROS: negative for - epistaxis, nasal discharge, oral lesions, sore throat, tinnitus or vertigo Allergy and Immunology ROS: negative for - hives or itchy/watery eyes Hematological and Lymphatic ROS: negative for - bleeding problems, bruising or swollen lymph nodes Endocrine ROS: negative for - galactorrhea, hair pattern changes, polydipsia/polyuria or temperature intolerance Respiratory ROS: negative for - cough, hemoptysis, shortness of breath or wheezing Cardiovascular ROS: negative for - chest pain, dyspnea on exertion, edema or irregular heartbeat Gastrointestinal ROS: negative for - abdominal pain, diarrhea, hematemesis, nausea/vomiting or stool incontinence Genito-Urinary ROS: negative for - dysuria, hematuria, incontinence or urinary frequency/urgency Musculoskeletal ROS: negative for - joint swelling or muscular weakness  Neurological ROS: as noted in HPI Dermatological ROS: negative for rash and skin lesion changes  Physical Examination: Blood pressure 147/83, pulse 69, temperature 97.6 F (36.4 C), temperature source Oral, resp. rate 18, SpO2 98.00%.  HEENT-  Normocephalic, no lesions, without obvious abnormality.  Normal external eye and conjunctiva.  Normal TM's bilaterally.  Normal auditory canals and external ears. Normal external nose, mucus membranes and septum.  Normal pharynx. Neck supple with no masses, nodes,  nodules or enlargement. Cardiovascular - S1, S2 normal Lungs - chest clear, no wheezing, rales, normal symmetric air entry, Heart exam - S1, S2 normal, no murmur, no gallop, rate regular Abdomen - soft, non-tender; bowel sounds normal; no masses,  no organomegaly Extremities - no edema  Neurologic Examination: Mental Status: Alert, oriented, thought content appropriate.  Speech fluent without evidence of aphasia.  Able to follow 3 step commands without difficulty. Cranial Nerves: II: visual fields grossly normal, pupils equal, round, reactive to light and accommodation III,IV, VI: ptosis not present, extra-ocular motions intact bilaterally V,VII: smile symmetric, facial light touch sensation normal bilaterally VIII: hearing normal bilaterally IX,X: gag reflex present XI: trapezius strength/neck flexion strength normal bilaterally XII: tongue strength normal  Motor: Right : Upper extremity   5/5    Left:     Upper extremity   5/5  Lower extremity   5/5     Lower extremity   5/5 Tone and bulk:normal tone throughout; no atrophy noted Sensory: Pinprick and light touch intact throughout, bilaterally Deep Tendon Reflexes: 2+ and symmetric with absent left AJ Plantars: Right: downgoing   Left: downgoing Cerebellar: normal finger-to-nose and normal heel-to-shin test   Assessment/Plan  Patient Active Hospital Problem List: Acute bilateral subcentimeter infarcts (08/05/2011)   Assessment: Patient to have selective angio in the morning but concerned about cardiac source of emboli as well and further work up indicated to rule out this possible etiology   Plan:  1.  Telemetry monitoring  2.  Echocardiogram             3.  Hemoglobin A1c and FLP  4.  NPO tonight for procedure in the morning  5.  Continue ASA and Plavix  6.  Frequent neuro checks.  7.  PT consult     Thana Farr, MD [facility] [phone] 08/05/2011, 5:08 PM

## 2011-08-06 ENCOUNTER — Encounter (HOSPITAL_COMMUNITY): Payer: Self-pay | Admitting: Radiology

## 2011-08-06 ENCOUNTER — Inpatient Hospital Stay (HOSPITAL_COMMUNITY): Payer: Medicare Other

## 2011-08-06 DIAGNOSIS — R42 Dizziness and giddiness: Secondary | ICD-10-CM | POA: Diagnosis not present

## 2011-08-06 DIAGNOSIS — I369 Nonrheumatic tricuspid valve disorder, unspecified: Secondary | ICD-10-CM

## 2011-08-06 DIAGNOSIS — I6529 Occlusion and stenosis of unspecified carotid artery: Secondary | ICD-10-CM | POA: Diagnosis not present

## 2011-08-06 DIAGNOSIS — R5383 Other fatigue: Secondary | ICD-10-CM | POA: Diagnosis not present

## 2011-08-06 DIAGNOSIS — I6340 Cerebral infarction due to embolism of unspecified cerebral artery: Secondary | ICD-10-CM | POA: Diagnosis not present

## 2011-08-06 DIAGNOSIS — R269 Unspecified abnormalities of gait and mobility: Secondary | ICD-10-CM

## 2011-08-06 LAB — LIPID PANEL
HDL: 29 mg/dL — ABNORMAL LOW (ref >39)
LDL Cholesterol: 67 mg/dL (ref 0–99)
Total CHOL/HDL Ratio: 4.2 RATIO
VLDL: 26 mg/dL (ref 0–40)

## 2011-08-06 LAB — HEMOGLOBIN A1C: Mean Plasma Glucose: 140 mg/dL — ABNORMAL HIGH (ref <117)

## 2011-08-06 MED ORDER — ASPIRIN 81 MG PO CHEW
CHEWABLE_TABLET | ORAL | Status: AC
  Filled 2011-08-06: qty 1

## 2011-08-06 MED ORDER — STROKE: EARLY STAGES OF RECOVERY BOOK
Freq: Once | Status: AC
  Administered 2011-08-06: 07:00:00
  Filled 2011-08-06: qty 1

## 2011-08-06 MED ORDER — FENTANYL CITRATE 0.05 MG/ML IJ SOLN
INTRAMUSCULAR | Status: AC | PRN
  Administered 2011-08-06: 25 ug via INTRAVENOUS
  Administered 2011-08-06: 50 ug via INTRAVENOUS

## 2011-08-06 MED ORDER — MIDAZOLAM HCL 2 MG/2ML IJ SOLN
INTRAMUSCULAR | Status: AC
  Filled 2011-08-06: qty 4

## 2011-08-06 MED ORDER — IOHEXOL 300 MG/ML  SOLN
150.0000 mL | Freq: Once | INTRAMUSCULAR | Status: AC | PRN
  Administered 2011-08-06: 60 mL via INTRAVENOUS

## 2011-08-06 MED ORDER — MIDAZOLAM HCL 5 MG/5ML IJ SOLN
INTRAMUSCULAR | Status: AC | PRN
  Administered 2011-08-06: 1 mg via INTRAVENOUS

## 2011-08-06 MED ORDER — FENTANYL CITRATE 0.05 MG/ML IJ SOLN
INTRAMUSCULAR | Status: AC
  Filled 2011-08-06: qty 4

## 2011-08-06 MED ORDER — HEPARIN SODIUM (PORCINE) 1000 UNIT/ML IJ SOLN
INTRAMUSCULAR | Status: AC | PRN
  Administered 2011-08-06 (×2): 500 [IU] via INTRAVENOUS

## 2011-08-06 MED ORDER — SODIUM CHLORIDE 0.9 % IV SOLN
INTRAVENOUS | Status: AC
  Administered 2011-08-06: 11:00:00 via INTRAVENOUS
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
## 2011-08-06 NOTE — Progress Notes (Signed)
*  PRELIMINARY RESULTS* Echocardiogram 2D Echocardiogram has been performed.  Andrew Malone [facility] 08/06/2011, 3:36 PM

## 2011-08-06 NOTE — Progress Notes (Signed)
Utilization review completed.  

## 2011-08-06 NOTE — Procedures (Signed)
Bilateral carotid and lt vertbral arteriograms. RT CFA approach Preliminary findings  1. Fully patent RT ICA stent  2. Mild ASVd lt ICA prox
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
## 2011-08-06 NOTE — Evaluation (Signed)
Physical Therapy Evaluation Patient Details Name: Andrew Malone MRN: [id number] DOB: 14-Nov-1940 Today's Date: 08/06/2011 Time: 4132-4401 PT Time Calculation (min): 16 min  PT Assessment / Plan / Recommendation Clinical Impression  Patient presents with acute bilateral subcentimeter infarcts of the supratentorial region. He has no skilled PT needs and is at baseline for his mobility.    PT Assessment  Patent does not need any further PT services    Follow Up Recommendations  No PT follow up    Barriers to Discharge  None      lEquipment Recommendations  None recommended by PT    Recommendations for Other Services  None  Frequency  N/A   Precautions / Restrictions Precautions Precautions: None   Pertinent Vitals/Pain VSS/ no pain      Mobility  Bed Mobility Bed Mobility: Supine to Sit;Sitting - Scoot to Edge of Bed Supine to Sit: 7: Independent Sitting - Scoot to Edge of Bed: 7: Independent Transfers Transfers: Sit to Stand;Stand to Sit Sit to Stand: 7: Independent;Without upper extremity assist;With upper extremity assist;From bed Stand to Sit: 7: Independent;Without upper extremity assist;With upper extremity assist;To bed Ambulation/Gait Ambulation/Gait Assistance: 7: Independent Ambulation Distance (Feet): 350 Feet Assistive device: None Gait Pattern: Within Functional Limits Stairs: Yes Stairs Assistance: 7: Independent Stair Management Technique: No rails;Alternating pattern;Forwards Number of Stairs: 4  Modified Rankin (Stroke Patients Only) Pre-Morbid Rankin Score: No symptoms Modified Rankin: No symptoms       Visit Information  Last PT Received On: 08/06/11    Subjective Data  Subjective: Patient reports no symptoms Patient Stated Goal: Home this pm   Prior Functioning  Home Living Lives With: Spouse Available Help at Discharge: Family;Available PRN/intermittently Type of Home: House Home Access: Stairs to enter [employer] of  Steps: 3 Entrance Stairs-Rails: Right;Left;Can reach both Home Layout: One level Home Adaptive Equipment: None Prior Function Level of Independence: Independent Able to Take Stairs?: Yes Driving: Yes Vocation: Full time employment Comments: Maintenance Communication Communication: No difficulties    Cognition  Overall Cognitive Status: Appears within functional limits for tasks assessed/performed Arousal/Alertness: Awake/alert Orientation Level: Appears intact for tasks assessed Behavior During Session: [facility] for tasks performed    Extremity/Trunk Assessment Right Lower Extremity Assessment RLE ROM/Strength/Tone: WFL for tasks assessed RLE Sensation: WFL - Light Touch;WFL - Proprioception RLE Coordination: WFL - gross/fine motor Left Lower Extremity Assessment LLE ROM/Strength/Tone: Within functional levels LLE Sensation: WFL - Light Touch;WFL - Proprioception LLE Coordination: WFL - gross/fine motor   Balance Standardized Balance Assessment Standardized Balance Assessment: Dynamic Gait Index Dynamic Gait Index Level Surface: Normal Change in Gait Speed: Normal Gait with Horizontal Head Turns: Normal Gait with Vertical Head Turns: Normal Gait and Pivot Turn: Normal Step Over Obstacle: Normal Step Around Obstacles: Normal Steps: Normal Total Score: 24   End of Session PT - End of Session Equipment Utilized During Treatment: Gait belt Activity Tolerance: Patient tolerated treatment well Patient left: in bed;with call bell/phone within reach;with family/visitor present Nurse Communication: Mobility status   Edwyna Perfect, PT  Pager [phone]  08/06/2011, 2:58 PM

## 2011-08-07 DIAGNOSIS — I6340 Cerebral infarction due to embolism of unspecified cerebral artery: Secondary | ICD-10-CM

## 2011-08-07 DIAGNOSIS — R269 Unspecified abnormalities of gait and mobility: Secondary | ICD-10-CM

## 2011-08-07 NOTE — Progress Notes (Signed)
Pt assessment unchanged from this am. Discharged via wheelchair to private vehicle with wife. Pt stable

## 2011-08-07 NOTE — Progress Notes (Signed)
Stroke Team Progress Note  HISTORY Andrew Malone is an 71 y.o. male who underwent stent placement approximately 6 weeks ago by Dr. Corliss Skains. Patient was placed on ASA and Plavix and did well until Thursday 07/30/2011  when he had an episode of being off balance that lasted about one minute. Seemed to go back to baseline until Saturday 08/01/2011 when he had this sensation return. It was mild and patient was able to continue with his ADL's. This continued until today when he symptoms became acutely worse. Patient presented at that time. MRI of the brain was performed and shows bilateral subcentimeter areas of acute infarction. MRA of the head and neck were unremarkable but due to artifact from the stent this area was not able to be evaluated well. It was determined at that time that the patient would be appropriate for admission and selective angio in the morning. Patient was not a TPA candidate secondary to delay in arrival. He was admitted for further evaluation and treatment.  SUBJECTIVE His wife is at the bedside.  Overall he feels his condition is stable. Wife is concerned with differing opinions between neurologist seen yesterday vs today. Multiple questions with ongoing discussion in diagnosis, prognosis and plan of care. Glad he is going home, but concerned all tests will not be done prior to discharge. MD/NP reassured her he is safe for discharge, discussed stroke symptoms to look for that would indicate need for calling 911 and return to hospital, and that ED would be equipped to handle their needs should they occur.  OBJECTIVE Most recent Vital Signs: Filed Vitals:   08/06/11 2000 08/07/11 0000 08/07/11 0400 08/07/11 0800  BP: 119/66 106/64 129/71 142/78  Pulse: 67 62 68 68  Temp: 98.2 F (36.8 C) 97.7 F (36.5 C) 97.6 F (36.4 C)   TempSrc: Oral Oral Oral   Resp: 18 18 18 18   Height:      Weight:      SpO2: 95% 95% 95% 95%   CBG (last 3)  No results found for this basename:  GLUCAP:3 in the last 72 hours Intake/Output from previous day: 06/06 0701 - 06/07 0700 In: -  Out: 1050 [Urine:1050]  IV Fluid Intake:     . sodium chloride 75 mL/hr at 08/05/11 2154  . sodium chloride 75 mL/hr at 08/06/11 1104   MEDICATIONS    . aspirin      . aspirin EC  162 mg Oral Daily  . clopidogrel  75 mg Oral Daily  . hydrochlorothiazide  25 mg Oral Daily  . pantoprazole  40 mg Oral Daily  . simvastatin  40 mg Oral q1800  . sodium chloride  3 mL Intravenous Q12H  . Tamsulosin HCl  0.4 mg Oral QHS   PRN:  acetaminophen, acetaminophen, acyclovir, alum & mag hydroxide-simeth, iohexol, ondansetron (ZOFRAN) IV, ondansetron, zolpidem  Diet:  Cardiac thin liquids Activity:  Up with assistance DVT Prophylaxis:  SCDs   CLINICALLY SIGNIFICANT STUDIES Basic Metabolic Panel:  Lab 08/05/11 1610  NA 139  K 4.0  CL 102  CO2 29  GLUCOSE 104*  BUN 19  CREATININE 1.03  CALCIUM 9.6  MG --  PHOS --   CBC:  Lab 08/05/11 1707  WBC 6.8  NEUTROABS 3.9  HGB 13.2  HCT 38.1*  MCV 85.6  PLT 195   Coagulation:  Lab 08/05/11 2140  LABPROT 12.9  INR 0.95   Lipid Panel    Component Value Date/Time   CHOL 122 08/06/2011 0520  TRIG 130 08/06/2011 0520   HDL 29* 08/06/2011 0520   CHOLHDL 4.2 08/06/2011 0520   VLDL 26 08/06/2011 0520   LDLCALC 67 08/06/2011 0520   HgbA1C  Lab Results  Component Value Date   HGBA1C 6.5* 08/06/2011   Urine Drug Screen:   No results found for this basename: labopia, cocainscrnur, labbenz, amphetmu, thcu, labbarb    Alcohol Level: No results found for this basename: ETH:2 in the last 168 hours  CT of the brain    Cerebral angio of the brain  1. Fully patent RT ICA stent 2. Mild ASVd lt ICA prox   MRI of the brain  08/05/2011  Bilateral areas of subcentimeter acute infarction affecting the supratentorial region as described. Question embolic strokes versus hypoperfusion event.  No areas of hemorrhage are associated.   MRA of the brain  08/05/2011   Unremarkable MR angiography intracranial circulation.   MRA of the neck 08/05/2011  Signal drop out related to placement of the right internal carotid artery stent but no evidence for dissection or stenosis related to recent stent placement.  Both cervical internal carotid arteries are widely patent.  If there is concern regarding possible intrastent stenosis, CTA neck or formal catheter angiogram may be necessary for further evaluation.  2D Echocardiogram  Aortic sclerosis. EF 60% with no source of embolus.   Carotid Doppler  See cerebral angio, CT angio neck   CXR    EKG  normal EKG, normal sinus rhythm.   Therapy Recommendations PT -none, OT -  Physical Examination:  Blood pressure 147/83, pulse 69, temperature 97.6 F (36.4 C), temperature source Oral, resp. rate 18, SpO2 98.00%.  HEENT- Normocephalic, no lesions, without obvious abnormality. Normal external eye and conjunctiva. Normal TM's bilaterally. Normal auditory canals and external ears. Normal external nose, mucus membranes and septum. Normal pharynx.  Neck supple with no masses, nodes, nodules or enlargement.  Cardiovascular - S1, S2 normal  Lungs - chest clear, no wheezing, rales, normal symmetric air entry, Heart exam - S1, S2 normal, no murmur, no gallop, rate regular  Abdomen - soft, non-tender; bowel sounds normal; no masses, no organomegaly  Extremities - no edema  Neurologic Examination:  Mental Status:  Alert, oriented, thought content appropriate. Speech fluent without evidence of aphasia. Able to follow 3 step commands without difficulty.  Cranial Nerves:  II: visual fields grossly normal, pupils equal, round, reactive to light and accommodation  III,IV, VI: ptosis not present, extra-ocular motions intact bilaterally  V,VII: smile symmetric, facial light touch sensation normal bilaterally  VIII: hearing normal bilaterally  IX,X: gag reflex present  XI: trapezius strength/neck flexion strength normal bilaterally    XII: tongue strength normal  Motor:  Right : Upper extremity 5/5 Left: Upper extremity 5/5  Lower extremity 5/5 Lower extremity 5/5  Tone and bulk:normal tone throughout; no atrophy noted  Sensory: Pinprick and light touch intact throughout, bilaterally  Deep Tendon Reflexes: 2+ and symmetric with absent left AJ  Plantars:  Right: downgoing Left: downgoing  Cerebellar:  normal finger-to-nose and normal heel-to-shin test   ASSESSMENT [name] is a 71 y.o. male with bilateral areas of subcentimeter acute infarction affecting the supratentorial region secondary to previous stent placement/angio vs embolic strokes of unknown etiology. On aspirin 81 mg orally every day and clopidogrel 75 mg orally every day prior to admission. Now on aspirin 81 mg orally every day and clopidogrel 75 mg orally every day for secondary stroke prevention. Patient with no resultant symptoms.  -  OSA -Hypertension, on HCTZ -CAD -hyperlipidemia, on simvastatin  Hospital day # 2  TREATMENT/PLAN -Continue aspirin 81 mg orally every day and clopidogrel 75 mg orally every day for secondary stroke prevention. -ongoing risk factor control -TEE next week with [facility] to look for embolic source. If positive for PFO (patent foramen ovale), needs bilateral lower extremity dopplers to rule out DVT as source of stroke. -Outpatient telemetry monitoring to assess patient for atrial fibrillation as source of stroke. Arranged with patient's cardiologist, [facility]. - Consider ACCELERATE trial, use of Cholesteryl Ester Transfer Protein (CETP) Inhibitor: Potential of Evacetrapib to treat atherosclerosis and CAD. [facility] will contact patient with information about trial, screen for inclusion. -patient advised to stay hydrated -may return to work in 1 week if feels stable -plan discharge today  Annie Main, AVNP, ANP-BC, GNP-BC [facility] Pager:  [phone] 08/07/2011 8:25 AM  Scribe for Dr. Delia Heady, Stroke Center Medical Director. He has personally reviewed chart, pertinent data, examined the patient and developed the plan of care. Pager:  [phone]
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
## 2011-08-07 NOTE — Discharge Instructions (Signed)
STROKE/TIA DISCHARGE INSTRUCTIONS SMOKING Cigarette smoking nearly doubles your risk of having a stroke & is the single most alterable risk factor  If you smoke or have smoked in the last 12 months, you are advised to quit smoking for your health.  Most of the excess cardiovascular risk related to smoking disappears within a year of stopping.  Ask you doctor about anti-smoking medications  [facility] Quit Line: 1-800-QUIT NOW  Free Smoking Cessation Classes [phone]  CHOLESTEROL Know your levels; limit fat & cholesterol in your diet  Lipid Panel     Component Value Date/Time   CHOL 122 08/06/2011 0520   TRIG 130 08/06/2011 0520   HDL 29* 08/06/2011 0520   CHOLHDL 4.2 08/06/2011 0520   VLDL 26 08/06/2011 0520   LDLCALC 67 08/06/2011 0520      Many patients benefit from treatment even if their cholesterol is at goal.  Goal: Total Cholesterol (CHOL) less than 160  Goal:  Triglycerides (TRIG) less than 150  Goal:  HDL greater than 40  Goal:  LDL (LDLCALC) less than 100   BLOOD PRESSURE American Stroke Association blood pressure target is less that 120/80 mm/Hg  Your discharge blood pressure is:  BP: 142/78 mmHg  Monitor your blood pressure  Limit your salt and alcohol intake  Many individuals will require more than one medication for high blood pressure  DIABETES (A1c is a blood sugar average for last 3 months) Goal HGBA1c is under 7% (HBGA1c is blood sugar average for last 3 months)  Diabetes: No known diagnosis of diabetes    Lab Results  Component Value Date   HGBA1C 6.5* 08/06/2011     Your HGBA1c can be lowered with medications, healthy diet, and exercise.  Check your blood sugar as directed by your physician  Call your physician if you experience unexplained or low blood sugars.  PHYSICAL ACTIVITY/REHABILITATION Goal is 30 minutes at least 4 days per week    Return to work:  in 1 week if you feel ok  Activity decreases your risk of heart attack and stroke and makes your heart  stronger.  It helps control your weight and blood pressure; helps you relax and can improve your mood.  Participate in a regular exercise program.  Talk with your doctor about the best form of exercise for you (dancing, walking, swimming, cycling).  DIET/WEIGHT Goal is to maintain a healthy weight  Your discharge diet is: Cardiac thin liquids Your height is:  Height: 5\' 10"  (177.8 cm) Your current weight is: Weight: 89.812 kg (198 lb) Your Body Mass Index (BMI) is:  BMI (Calculated): 28.5   Following the type of diet specifically designed for you will help prevent another stroke.  Your goal weight range is:  ***  Your goal Body Mass Index (BMI) is 19-24.  Healthy food habits can help reduce 3 risk factors for stroke:  High cholesterol, hypertension, and excess weight.  RESOURCES Stroke/Support Group:  Call [phone]  they meet the 3rd Sunday of the month on the Rehab Unit at [facility], [place] ( no meetings June, July & Aug).  STROKE EDUCATION PROVIDED/REVIEWED AND GIVEN TO PATIENT Stroke warning signs and symptoms How to activate emergency medical system (call 911). Medications prescribed at discharge. Need for follow-up after discharge. Personal risk factors for stroke. Pneumonia vaccine given:   {STROKE DC YES/NO/DATE:22363} Flu vaccine given:   {STROKE DC YES/NO/DATE:22363} My questions have been answered, the writing is legible, and I understand these instructions.  I will adhere  to these goals & educational materials that have been provided to me after my discharge from the hospital.

## 2011-08-11 ENCOUNTER — Encounter (HOSPITAL_COMMUNITY)
Admission: RE | Disposition: A | Discharge status: Home / Self Care | Payer: Self-pay | Source: Ambulatory Visit | Attending: Internal Medicine

## 2011-08-11 ENCOUNTER — Ambulatory Visit (HOSPITAL_COMMUNITY)
Admission: RE | Admit: 2011-08-11 | Discharge: 2011-08-11 | Disposition: A | Discharge status: Home / Self Care | Payer: Medicare Other | Source: Ambulatory Visit | Attending: Internal Medicine | Admitting: Internal Medicine

## 2011-08-11 ENCOUNTER — Encounter (HOSPITAL_COMMUNITY): Payer: Self-pay

## 2011-08-11 DIAGNOSIS — I253 Aneurysm of heart: Secondary | ICD-10-CM | POA: Diagnosis not present

## 2011-08-11 DIAGNOSIS — Z8673 Personal history of transient ischemic attack (TIA), and cerebral infarction without residual deficits: Secondary | ICD-10-CM | POA: Insufficient documentation

## 2011-08-11 DIAGNOSIS — Q211 Atrial septal defect: Secondary | ICD-10-CM | POA: Insufficient documentation

## 2011-08-11 DIAGNOSIS — Q2111 Secundum atrial septal defect: Secondary | ICD-10-CM | POA: Insufficient documentation

## 2011-08-11 DIAGNOSIS — I6350 Cerebral infarction due to unspecified occlusion or stenosis of unspecified cerebral artery: Secondary | ICD-10-CM

## 2011-08-11 DIAGNOSIS — I6789 Other cerebrovascular disease: Secondary | ICD-10-CM | POA: Diagnosis not present

## 2011-08-11 DIAGNOSIS — I6340 Cerebral infarction due to embolism of unspecified cerebral artery: Secondary | ICD-10-CM

## 2011-08-11 HISTORY — PX: TEE WITHOUT CARDIOVERSION: SHX5443

## 2011-08-11 SURGERY — ECHOCARDIOGRAM, TRANSESOPHAGEAL
Anesthesia: Moderate Sedation

## 2011-08-11 MED ORDER — FENTANYL CITRATE 0.05 MG/ML IJ SOLN
INTRAMUSCULAR | Status: DC | PRN
  Administered 2011-08-11 (×3): 25 ug via INTRAVENOUS

## 2011-08-11 MED ORDER — FENTANYL CITRATE 0.05 MG/ML IJ SOLN
INTRAMUSCULAR | Status: AC
  Filled 2011-08-11: qty 2

## 2011-08-11 MED ORDER — MIDAZOLAM HCL 10 MG/2ML IJ SOLN
INTRAMUSCULAR | Status: AC
  Filled 2011-08-11: qty 2

## 2011-08-11 MED ORDER — MIDAZOLAM HCL 10 MG/2ML IJ SOLN
INTRAMUSCULAR | Status: DC | PRN
  Administered 2011-08-11 (×5): 2 mg via INTRAVENOUS

## 2011-08-11 MED ORDER — BUTAMBEN-TETRACAINE-BENZOCAINE 2-2-14 % EX AERO
INHALATION_SPRAY | CUTANEOUS | Status: DC | PRN
  Administered 2011-08-11: 2 via TOPICAL

## 2011-08-11 NOTE — Discharge Instructions (Signed)
Transesophageal Echocardiography A transesophageal echocardiogram (TEE) is a special type of test that produces images of the heart by sound waves (echocardiogram). This type of echocardiogram can obtain better images of the heart than a standard echocardiogram. A TEE is done by passing a flexible tube down the esophagus. The heart is located in front of the esophagus. Because the heart and esophagus are close to one another, your caregiver can take very clear, detailed pictures of the heart via ultrasound waves. WHY HAVE A TEE? Your caregiver may need more information based on your medical condition. A TEE is usually performed due to the following:  Your caregiver needs more information based on standard echocardiogram findings.   If you had a stroke, this might have happened because a clot formed in your heart. A TEE can visualize different areas of the heart and check for clots.   To check valve anatomy and function. Your caregiver will especially look at the mitral valve.   To check for redness, soreness, and swelling (inflammation) on the inside lining of the heart (endocarditis).   To evaluate the dividing wall (septum) of the heart and presence of a hole that did not close after birth (patent foramen ovale, PFO).   To help diagnose a tear in the wall of the aorta (aortic dissection).   During cardiac valve surgery, a TEE probe is placed. This allows the surgeon to assess the valve repair before closing the chest.  LET YOUR CAREGIVER KNOW ABOUT:   Swallowing difficulties.   An esophageal obstruction.   Use of aspirin or antiplatelet therapy.  RISKS AND COMPLICATIONS  Though extremely rare, an esophageal tear (rupture) is a potential complication. BEFORE THE PROCEDURE   Arrive at least 1 hour before the procedure or as told by your caregiver.   Do not eat or drink for 6 hours before the procedure or as told by your caregiver.   An intravenous (IV) access tube will be started in  the arm.  PROCEDURE   A medicine to help you relax (sedative) will be given through the IV.   A medicine that numbs the area (local anesthetic) may be sprayed to the back of the throat.   Your blood pressure, heart rate, and breathing (vital signs) will be monitored during the procedure.   The TEE probe is a long, flexible tube. It is about the width of an adult male's index finger. The tip of the probe is placed into the back of the mouth and you will be asked to swallow. This helps to pass the tip of the probe into the esophagus. Once the tip of the probe is in the correct area, your caregiver can take pictures of the heart.   A TEE is usually not a painful procedure. You may feel the probe press against the back of the throat. The probe does not enter the trachea and does not affect your breathing.   Your time spent at the hospital is usually less than 2 hours.  AFTER THE PROCEDURE   You will be in bed, resting until you have fully returned to consciousness.   When you first awaken, your throat may feel slightly sore and will probably still feel numb. This will improve slowly over time.   You will not be allowed to eat or drink until it is clear that numbness has improved.   Once you have been able to drink, urinate, and sit on the edge of the bed without feeling sick to your stomach (nauseous)   or dizzy, you may be cleared to dress and go home.   Do not drive yourself home. You have had medications that can continue to make you feel drowsy and can impair your reflexes.   You should have a friend or family member with you for the next 24 hours after your examination.  Obtaining the test results It is your responsibility to obtain your test results. Ask the lab or department performing the test when and how you will get your results. SEEK IMMEDIATE MEDICAL CARE IF: Transesophageal Echocardiography A transesophageal echocardiogram (TEE) is a special type of test that produces images of  the heart by sound waves (echocardiogram). This type of echocardiogram can obtain better images of the heart than a standard echocardiogram. A TEE is done by passing a flexible tube down the esophagus. The heart is located in front of the esophagus. Because the heart and esophagus are close to one another, your caregiver can take very clear, detailed pictures of the heart via ultrasound waves. WHY HAVE A TEE? Your caregiver may need more information based on your medical condition. A TEE is usually performed due to the following:  Your caregiver needs more information based on standard echocardiogram findings.   If you had a stroke, this might have happened because a clot formed in your heart. A TEE can visualize different areas of the heart and check for clots.   To check valve anatomy and function. Your caregiver will especially look at the mitral valve.   To check for redness, soreness, and swelling (inflammation) on the inside lining of the heart (endocarditis).   To evaluate the dividing wall (septum) of the heart and presence of a hole that did not close after birth (patent foramen ovale, PFO).   To help diagnose a tear in the wall of the aorta (aortic dissection).   During cardiac valve surgery, a TEE probe is placed. This allows the surgeon to assess the valve repair before closing the chest.  LET YOUR CAREGIVER KNOW ABOUT:   Swallowing difficulties.   An esophageal obstruction.   Use of aspirin or antiplatelet therapy.  RISKS AND COMPLICATIONS  Though extremely rare, an esophageal tear (rupture) is a potential complication. BEFORE THE PROCEDURE   Arrive at least 1 hour before the procedure or as told by your caregiver.   Do not eat or drink for 6 hours before the procedure or as told by your caregiver.   An intravenous (IV) access tube will be started in the arm.  PROCEDURE   A medicine to help you relax (sedative) will be given through the IV.   A medicine that numbs  the area (local anesthetic) may be sprayed to the back of the throat.   Your blood pressure, heart rate, and breathing (vital signs) will be monitored during the procedure.   The TEE probe is a long, flexible tube. It is about the width of an adult male's index finger. The tip of the probe is placed into the back of the mouth and you will be asked to swallow. This helps to pass the tip of the probe into the esophagus. Once the tip of the probe is in the correct area, your caregiver can take pictures of the heart.   A TEE is usually not a painful procedure. You may feel the probe press against the back of the throat. The probe does not enter the trachea and does not affect your breathing.   Your time spent at the hospital is usually  less than 2 hours.  AFTER THE PROCEDURE   You will be in bed, resting until you have fully returned to consciousness.   When you first awaken, your throat may feel slightly sore and will probably still feel numb. This will improve slowly over time.   You will not be allowed to eat or drink until it is clear that numbness has improved.   Once you have been able to drink, urinate, and sit on the edge of the bed without feeling sick to your stomach (nauseous) or dizzy, you may be cleared to dress and go home.   Do not drive yourself home. You have had medications that can continue to make you feel drowsy and can impair your reflexes.   You should have a friend or family member with you for the next 24 hours after your examination.  Obtaining the test results It is your responsibility to obtain your test results. Ask the lab or department performing the test when and how you will get your results. SEEK IMMEDIATE MEDICAL CARE IF:   There is chest pain.   You have a hard time breathing or have shortness of breath.   You cough or throw up (vomit) blood.  MAKE SURE YOU:   Understand these instructions.   Will watch this condition.   Will get help right away if  you is not doing well or gets worse.  Document Released: 05/09/2002 Document Revised: 02/05/2011 Document Reviewed: 07/31/2008 [facility] Patient Information 2012 [place], [place].  There is chest pain.   You have a hard time breathing or have shortness of breath.   You cough or throw up (vomit) blood.  MAKE SURE YOU:   Understand these instructions.   Will watch this condition.   Will get help right away if you is not doing well or gets worse.  Document Released: 05/09/2002 Document Revised: 02/05/2011 Document Reviewed: 07/31/2008 [facility] Patient Information 2012 [place], [place].

## 2011-08-11 NOTE — CV Procedure (Signed)
    TRANSESOPHAGEAL ECHOCARDIOGRAM   NAME:  Andrew Malone   MRN: [id number] DOB:  15-Dec-1940   ADMIT DATE: 08/11/2011  INDICATIONS: CVA   PROCEDURE:   Informed consent was obtained prior to the procedure. The risks, benefits and alternatives for the procedure were discussed and the patient comprehended these risks.  Risks include, but are not limited to, cough, sore throat, vomiting, nausea, somnolence, esophageal and stomach trauma or perforation, bleeding, low blood pressure, aspiration, pneumonia, infection, trauma to the teeth and death.    After a procedural time-out, the patient was given 10 mg versed and 75 mcg fentanyl for moderate sedation.  The oropharynx was anesthetized cetacaine spray.  The transesophageal probe was inserted in the esophagus and stomach without difficulty and multiple views were obtained.   Agitated microbubble saline contrast was administered.  COMPLICATIONS:    There were no immediate complications.  FINDINGS:  LEFT VENTRICLE: EF = 65-70% no regional wall motion abnormalities. Proximal septum is thick with small LVOT. Mild turbulence over LVOT.   RIGHT VENTRICLE: Normal  LEFT ATRIUM: Normal  RIGHT ATRIUM: Normal  AORTIC VALVE:  Trileaflet. Mildly calcified. No AS/AI.   MITRAL VALVE:    Trivial MR  TRICUSPID VALVE: Normal. No TR  PULMONIC VALVE: Not well visualized.  INTERATRIAL SEPTUM: Atrial septa aneurysm with small PFO. Very mild R to L shunting at rest with bubble.  PERICARDIUM: No effusion   DESCENDING AORTA: Moderate plaque. With several prominent focal plaques.   Conclusion:  1. Normal LVEF 2. Prominent LV septum with mild LVOT turbulence 3. Interatrial septal aneurysm with small PFO. + bubble 4. Moderate aortic plaquing 5. Mild subglottic resistance passing scope. Consider proximal stricture.

## 2011-08-11 NOTE — Interval H&P Note (Signed)
History and Physical Interval Note:  08/11/2011 2:22 PM  Andrew Malone  has presented today for surgery, with the diagnosis of cerebral embolism with infarct.  The various methods of treatment have been discussed with the patient and family. After consideration of risks, benefits and other options for treatment, the patient has consented to  Procedure(s) (LRB): TRANSESOPHAGEAL ECHOCARDIOGRAM (TEE) (N/A) as a surgical intervention .  The patients' history has been reviewed, patient examined, no change in status, stable for surgery.  I have reviewed the patients' chart and labs.  Questions were answered to the patient's satisfaction.     [name]

## 2011-08-11 NOTE — Progress Notes (Signed)
  Echocardiogram Echocardiogram Transesophageal has been performed.  Andrew Malone [facility] 08/11/2011, 3:25 PM

## 2011-08-11 NOTE — H&P (View-Only) (Signed)
Stroke Team Progress Note  HISTORY Andrew Malone is an 71 y.o. male who underwent stent placement approximately 6 weeks ago by Dr. [name]. Patient was placed on ASA and Plavix and did well until Thursday 07/30/2011  when he had an episode of being off balance that lasted about one minute. Seemed to go back to baseline until Saturday 08/01/2011 when he had this sensation return. It was mild and patient was able to continue with his ADL's. This continued until today when he symptoms became acutely worse. Patient presented at that time. MRI of the brain was performed and shows bilateral subcentimeter areas of acute infarction. MRA of the head and neck were unremarkable but due to artifact from the stent this area was not able to be evaluated well. It was determined at that time that the patient would be appropriate for admission and selective angio in the morning. Patient was not a TPA candidate secondary to delay in arrival. He was admitted for further evaluation and treatment.  SUBJECTIVE His wife is at the bedside.  Overall he feels his condition is stable. Wife is concerned with differing opinions between neurologist seen yesterday vs today. Multiple questions with ongoing discussion in diagnosis, prognosis and plan of care. Glad he is going home, but concerned all tests will not be done prior to discharge. MD/NP reassured her he is safe for discharge, discussed stroke symptoms to look for that would indicate need for calling 911 and return to hospital, and that ED would be equipped to handle their needs should they occur.  OBJECTIVE Most recent Vital Signs: Filed Vitals:   08/06/11 2000 08/07/11 0000 08/07/11 0400 08/07/11 0800  BP: 119/66 106/64 129/71 142/78  Pulse: 67 62 68 68  Temp: 98.2 F (36.8 C) 97.7 F (36.5 C) 97.6 F (36.4 C)   TempSrc: Oral Oral Oral   Resp: 18 18 18 18  Height:      Weight:      SpO2: 95% 95% 95% 95%   CBG (last 3)  No results found for this basename:  GLUCAP:3 in the last 72 hours Intake/Output from previous day: 06/06 0701 - 06/07 0700 In: -  Out: 1050 [Urine:1050]  IV Fluid Intake:     . sodium chloride 75 mL/hr at 08/05/11 2154  . sodium chloride 75 mL/hr at 08/06/11 1104   MEDICATIONS    . aspirin      . aspirin EC  162 mg Oral Daily  . clopidogrel  75 mg Oral Daily  . hydrochlorothiazide  25 mg Oral Daily  . pantoprazole  40 mg Oral Daily  . simvastatin  40 mg Oral q1800  . sodium chloride  3 mL Intravenous Q12H  . Tamsulosin HCl  0.4 mg Oral QHS   PRN:  acetaminophen, acetaminophen, acyclovir, alum & mag hydroxide-simeth, iohexol, ondansetron (ZOFRAN) IV, ondansetron, zolpidem  Diet:  Cardiac thin liquids Activity:  Up with assistance DVT Prophylaxis:  SCDs   CLINICALLY SIGNIFICANT STUDIES Basic Metabolic Panel:  Lab 08/05/11 1707  NA 139  K 4.0  CL 102  CO2 29  GLUCOSE 104*  BUN 19  CREATININE 1.03  CALCIUM 9.6  MG --  PHOS --   CBC:  Lab 08/05/11 1707  WBC 6.8  NEUTROABS 3.9  HGB 13.2  HCT 38.1*  MCV 85.6  PLT 195   Coagulation:  Lab 08/05/11 2140  LABPROT 12.9  INR 0.95   Lipid Panel    Component Value Date/Time   CHOL 122 08/06/2011 0520     TRIG 130 08/06/2011 0520   HDL 29* 08/06/2011 0520   CHOLHDL 4.2 08/06/2011 0520   VLDL 26 08/06/2011 0520   LDLCALC 67 08/06/2011 0520   HgbA1C  Lab Results  Component Value Date   HGBA1C 6.5* 08/06/2011   Urine Drug Screen:   No results found for this basename: labopia, cocainscrnur, labbenz, amphetmu, thcu, labbarb    Alcohol Level: No results found for this basename: ETH:2 in the last 168 hours  CT of the brain    Cerebral angio of the brain  1. Fully patent RT ICA stent 2. Mild ASVd lt ICA prox   MRI of the brain  08/05/2011  Bilateral areas of subcentimeter acute infarction affecting the supratentorial region as described. Question embolic strokes versus hypoperfusion event.  No areas of hemorrhage are associated.   MRA of the brain  08/05/2011   Unremarkable MR angiography intracranial circulation.   MRA of the neck 08/05/2011  Signal drop out related to placement of the right internal carotid artery stent but no evidence for dissection or stenosis related to recent stent placement.  Both cervical internal carotid arteries are widely patent.  If there is concern regarding possible intrastent stenosis, CTA neck or formal catheter angiogram may be necessary for further evaluation.  2D Echocardiogram  Aortic sclerosis. EF 60% with no source of embolus.   Carotid Doppler  See cerebral angio, CT angio neck   CXR    EKG  normal EKG, normal sinus rhythm.   Therapy Recommendations PT -none, OT -  Physical Examination:  Blood pressure 147/83, pulse 69, temperature 97.6 F (36.4 C), temperature source Oral, resp. rate 18, SpO2 98.00%.  HEENT- Normocephalic, no lesions, without obvious abnormality. Normal external eye and conjunctiva. Normal TM's bilaterally. Normal auditory canals and external ears. Normal external nose, mucus membranes and septum. Normal pharynx.  Neck supple with no masses, nodes, nodules or enlargement.  Cardiovascular - S1, S2 normal  Lungs - chest clear, no wheezing, rales, normal symmetric air entry, Heart exam - S1, S2 normal, no murmur, no gallop, rate regular  Abdomen - soft, non-tender; bowel sounds normal; no masses, no organomegaly  Extremities - no edema  Neurologic Examination:  Mental Status:  Alert, oriented, thought content appropriate. Speech fluent without evidence of aphasia. Able to follow 3 step commands without difficulty.  Cranial Nerves:  II: visual fields grossly normal, pupils equal, round, reactive to light and accommodation  III,IV, VI: ptosis not present, extra-ocular motions intact bilaterally  V,VII: smile symmetric, facial light touch sensation normal bilaterally  VIII: hearing normal bilaterally  IX,X: gag reflex present  XI: trapezius strength/neck flexion strength normal bilaterally    XII: tongue strength normal  Motor:  Right : Upper extremity 5/5 Left: Upper extremity 5/5  Lower extremity 5/5 Lower extremity 5/5  Tone and bulk:normal tone throughout; no atrophy noted  Sensory: Pinprick and light touch intact throughout, bilaterally  Deep Tendon Reflexes: 2+ and symmetric with absent left AJ  Plantars:  Right: downgoing Left: downgoing  Cerebellar:  normal finger-to-nose and normal heel-to-shin test   ASSESSMENT Andrew Malone is a 70 y.o. male with bilateral areas of subcentimeter acute infarction affecting the supratentorial region secondary to previous stent placement/angio vs embolic strokes of unknown etiology. On aspirin 81 mg orally every day and clopidogrel 75 mg orally every day prior to admission. Now on aspirin 81 mg orally every day and clopidogrel 75 mg orally every day for secondary stroke prevention. Patient with no resultant symptoms.  -  OSA -Hypertension, on HCTZ -CAD -hyperlipidemia, on simvastatin  Hospital day # 2  TREATMENT/PLAN -Continue aspirin 81 mg orally every day and clopidogrel 75 mg orally every day for secondary stroke prevention. -ongoing risk factor control -TEE next week with [facility] to look for embolic source. If positive for PFO (patent foramen ovale), needs bilateral lower extremity dopplers to rule out DVT as source of stroke. -Outpatient telemetry monitoring to assess patient for atrial fibrillation as source of stroke. Arranged with patient's cardiologist, [facility]. - Consider ACCELERATE trial, use of Cholesteryl Ester Transfer Protein (CETP) Inhibitor: Potential of Evacetrapib to treat atherosclerosis and CAD. [facility] will contact patient with information about trial, screen for inclusion. -patient advised to stay hydrated -may return to work in 1 week if feels stable -plan discharge today  SHARON BIBY, AVNP, ANP-BC, GNP-BC [facility] Pager:  [phone] 08/07/2011 8:25 AM  Scribe for Dr. Matvey Llanas, Stroke Center Medical Director. He has personally reviewed chart, pertinent data, examined the patient and developed the plan of care. Pager:  [phone]   

## 2011-08-12 ENCOUNTER — Other Ambulatory Visit (HOSPITAL_COMMUNITY): Payer: Medicare Other

## 2011-08-12 ENCOUNTER — Encounter (HOSPITAL_COMMUNITY): Payer: Self-pay | Admitting: Internal Medicine

## 2011-08-12 ENCOUNTER — Other Ambulatory Visit: Payer: Self-pay | Admitting: Internal Medicine

## 2011-08-13 ENCOUNTER — Encounter (INDEPENDENT_AMBULATORY_CARE_PROVIDER_SITE_OTHER): Payer: Medicare Other

## 2011-08-13 DIAGNOSIS — I4891 Unspecified atrial fibrillation: Secondary | ICD-10-CM | POA: Diagnosis not present

## 2011-08-18 ENCOUNTER — Telehealth (HOSPITAL_COMMUNITY): Payer: Self-pay

## 2011-08-18 NOTE — Telephone Encounter (Signed)
I spoke with Andrew Malone and let her know that I was mailing the letter to her

## 2011-08-24 ENCOUNTER — Ambulatory Visit (INDEPENDENT_AMBULATORY_CARE_PROVIDER_SITE_OTHER): Payer: Medicare Other | Admitting: Internal Medicine

## 2011-08-24 ENCOUNTER — Encounter: Payer: Self-pay | Admitting: Internal Medicine

## 2011-08-24 VITALS — BP 118/66 | HR 70 | Temp 97.5°F | Resp 16 | Wt 189.2 lb

## 2011-08-24 DIAGNOSIS — E119 Type 2 diabetes mellitus without complications: Secondary | ICD-10-CM

## 2011-08-24 DIAGNOSIS — I10 Essential (primary) hypertension: Secondary | ICD-10-CM

## 2011-08-24 DIAGNOSIS — E785 Hyperlipidemia, unspecified: Secondary | ICD-10-CM

## 2011-08-24 DIAGNOSIS — I739 Peripheral vascular disease, unspecified: Secondary | ICD-10-CM

## 2011-08-24 DIAGNOSIS — I6350 Cerebral infarction due to unspecified occlusion or stenosis of unspecified cerebral artery: Secondary | ICD-10-CM

## 2011-08-24 MED ORDER — CLOPIDOGREL BISULFATE 75 MG PO TABS: 75.0000 mg | ORAL_TABLET | Freq: Every day | ORAL | Status: DC

## 2011-08-24 MED ORDER — PANTOPRAZOLE SODIUM 40 MG PO TBEC: 40.0000 mg | DELAYED_RELEASE_TABLET | Freq: Every day | ORAL | Status: DC

## 2011-08-24 NOTE — Assessment & Plan Note (Signed)
Had RICA PTCA May 2nd and did well.

## 2011-08-24 NOTE — Assessment & Plan Note (Signed)
BP Readings from Last 3 Encounters:  08/24/11 118/66  08/11/11 140/87  08/11/11 140/87   OK control on present regimen
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
## 2011-08-24 NOTE — Assessment & Plan Note (Signed)
Reveiwed labs. He is already following a good diet as revealed in the labs reviewed.

## 2011-08-24 NOTE — Assessment & Plan Note (Signed)
Lab Results  Component Value Date   CHOL 122 08/06/2011   HDL 29* 08/06/2011   LDLCALC 67 08/06/2011   TRIG 130 08/06/2011   CHOLHDL 4.2 08/06/2011   Adequate control of LDL cholesterol @ better than goal of less than 80. HDL is very low.  Plan No change in medications.

## 2011-08-24 NOTE — Patient Instructions (Addendum)
Diabetes - manage with diet only with the last lab showing good control - A1C = 6.5%  Stroke - looks like this is almost done in terms of work up. You will continue on the aspirin and plavix for at least a year. There interesting studies about reducing stroke risk. Dr. Pearlean Brownie is very good at stroke neurology.  Blood pressure is great. Rx renewed.
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
## 2011-09-07 ENCOUNTER — Ambulatory Visit (INDEPENDENT_AMBULATORY_CARE_PROVIDER_SITE_OTHER): Payer: Medicare Other | Admitting: *Deleted

## 2011-09-07 DIAGNOSIS — D649 Anemia, unspecified: Secondary | ICD-10-CM

## 2011-09-07 MED ORDER — CYANOCOBALAMIN 1000 MCG/ML IJ SOLN
1000.0000 ug | Freq: Once | INTRAMUSCULAR | Status: AC
  Administered 2011-09-07: 1000 ug via INTRAMUSCULAR

## 2011-09-08 ENCOUNTER — Telehealth: Payer: Self-pay

## 2011-09-08 NOTE — Telephone Encounter (Signed)
patient wife notified of your response.

## 2011-09-08 NOTE — Telephone Encounter (Signed)
Pt's spouse called stating pt has been c/o ringing in his ears and trouble sleeping. Pt is requesting Rx to help with sleep and advisement on ringing in ears.

## 2011-09-08 NOTE — Telephone Encounter (Signed)
If ringing in the ears is new - recommend audiology evaluation.  For sleep the first thing to try is benadryl 25 - 50 mg qHS. If that fails - we can try Rx product

## 2011-09-10 DIAGNOSIS — H903 Sensorineural hearing loss, bilateral: Secondary | ICD-10-CM | POA: Diagnosis not present

## 2011-09-10 DIAGNOSIS — H9319 Tinnitus, unspecified ear: Secondary | ICD-10-CM | POA: Diagnosis not present

## 2011-09-14 DIAGNOSIS — H2510 Age-related nuclear cataract, unspecified eye: Secondary | ICD-10-CM | POA: Diagnosis not present

## 2011-09-14 DIAGNOSIS — H26019 Infantile and juvenile cortical, lamellar, or zonular cataract, unspecified eye: Secondary | ICD-10-CM | POA: Diagnosis not present

## 2011-09-29 ENCOUNTER — Other Ambulatory Visit: Payer: Self-pay | Admitting: Physician Assistant

## 2011-09-29 DIAGNOSIS — I6340 Cerebral infarction due to embolism of unspecified cerebral artery: Secondary | ICD-10-CM

## 2011-10-02 ENCOUNTER — Telehealth: Payer: Self-pay | Admitting: Cardiology

## 2011-10-02 NOTE — Telephone Encounter (Signed)
pt aware of results of monitor

## 2011-10-02 NOTE — Telephone Encounter (Signed)
They would like results of monitor he wore a couple weeks ago

## 2011-10-05 DIAGNOSIS — L819 Disorder of pigmentation, unspecified: Secondary | ICD-10-CM | POA: Diagnosis not present

## 2011-10-05 DIAGNOSIS — L821 Other seborrheic keratosis: Secondary | ICD-10-CM | POA: Diagnosis not present

## 2011-10-05 DIAGNOSIS — R972 Elevated prostate specific antigen [PSA]: Secondary | ICD-10-CM | POA: Diagnosis not present

## 2011-10-05 DIAGNOSIS — N401 Enlarged prostate with lower urinary tract symptoms: Secondary | ICD-10-CM | POA: Diagnosis not present

## 2011-10-05 DIAGNOSIS — L578 Other skin changes due to chronic exposure to nonionizing radiation: Secondary | ICD-10-CM | POA: Diagnosis not present

## 2011-10-14 ENCOUNTER — Telehealth (HOSPITAL_COMMUNITY): Payer: Self-pay

## 2011-10-14 NOTE — Telephone Encounter (Signed)
Spoke with Mrs. Barthelemy to give her the doppler apt date of Monday the 26th.

## 2011-10-19 DIAGNOSIS — E119 Type 2 diabetes mellitus without complications: Secondary | ICD-10-CM | POA: Diagnosis not present

## 2011-10-19 DIAGNOSIS — I6340 Cerebral infarction due to embolism of unspecified cerebral artery: Secondary | ICD-10-CM | POA: Diagnosis not present

## 2011-10-19 DIAGNOSIS — E785 Hyperlipidemia, unspecified: Secondary | ICD-10-CM | POA: Diagnosis not present

## 2011-10-19 DIAGNOSIS — I10 Essential (primary) hypertension: Secondary | ICD-10-CM | POA: Diagnosis not present

## 2011-10-22 ENCOUNTER — Telehealth: Payer: Self-pay | Admitting: *Deleted

## 2011-10-22 MED ORDER — ONETOUCH LANCETS MISC: Status: DC

## 2011-10-22 MED ORDER — GLUCOSE BLOOD VI STRP: ORAL_STRIP | Status: DC

## 2011-10-22 NOTE — Telephone Encounter (Signed)
Called pt back spoke with wife will leave one touch monitor for pick-up and send supplies to his pharmacy... 10/22/11@10 :40am/LMB

## 2011-10-22 NOTE — Telephone Encounter (Signed)
Left msg on triage stating his stroke doctor want him to start checking his blood sugars and keep log. Needing to get a glucose monitor... 10/22/11@8 :51am/LMB

## 2011-10-26 ENCOUNTER — Ambulatory Visit (HOSPITAL_COMMUNITY)
Admission: RE | Admit: 2011-10-26 | Discharge: 2011-10-26 | Disposition: A | Discharge status: Home / Self Care | Payer: Medicare Other | Source: Ambulatory Visit | Attending: Physician Assistant | Admitting: Physician Assistant

## 2011-10-26 ENCOUNTER — Other Ambulatory Visit: Payer: Self-pay | Admitting: *Deleted

## 2011-10-26 DIAGNOSIS — Z48812 Encounter for surgical aftercare following surgery on the circulatory system: Secondary | ICD-10-CM

## 2011-10-26 DIAGNOSIS — I6340 Cerebral infarction due to embolism of unspecified cerebral artery: Secondary | ICD-10-CM

## 2011-10-26 DIAGNOSIS — R42 Dizziness and giddiness: Secondary | ICD-10-CM | POA: Diagnosis not present

## 2011-10-26 DIAGNOSIS — I6529 Occlusion and stenosis of unspecified carotid artery: Secondary | ICD-10-CM | POA: Insufficient documentation

## 2011-10-26 DIAGNOSIS — I6350 Cerebral infarction due to unspecified occlusion or stenosis of unspecified cerebral artery: Secondary | ICD-10-CM

## 2011-10-26 DIAGNOSIS — I739 Peripheral vascular disease, unspecified: Secondary | ICD-10-CM

## 2011-10-26 MED ORDER — GLUCOSE BLOOD VI STRP: ORAL_STRIP | Status: DC

## 2011-10-26 NOTE — Telephone Encounter (Signed)
Received fax stating can md write rx for at least 50 strips or medicare will not cover. Sending new rx to [facility]...Raechel Chute

## 2011-10-26 NOTE — Progress Notes (Signed)
VASCULAR LAB PRELIMINARY  PRELIMINARY  PRELIMINARY  PRELIMINARY  Carotid Dopplers completed.    Preliminary report:  Right ICA stent appears open.  No significant left ICA stenosis noted.  Bilateral vertebral artery flow is antegrade.  Marketia Stallsmith, 10/26/2011, 10:34 AM

## 2011-10-27 ENCOUNTER — Ambulatory Visit (HOSPITAL_COMMUNITY): Payer: Medicare Other

## 2011-11-03 ENCOUNTER — Ambulatory Visit: Payer: Medicare Other

## 2011-11-04 ENCOUNTER — Telehealth: Payer: Self-pay | Admitting: Internal Medicine

## 2011-11-04 NOTE — Telephone Encounter (Signed)
Caller: Andrew Malone/Spouse; Patient Name: Andrew Malone; PCP: Illene Regulus (Adults only); Best Callback Phone Number: [phone]; Call regarding Lipo-Flavonoid supplement for Ear Ringing.  Patient has had Ear Ringing since June 2013 after several Mini strokes. Wife wanting to know if safe for husband to take.   Per www.drugs.com, no contraindications to Lipo-Flavonoid and Patient's current med list.  Info does states it counteracts with Coumadin but NOT Plavix. Please confirm with MD if Patient may take Lipo-Flavonoid for chronic ringing in Ears, Wife also wants to confirm if Patient should stop Aspirin while on Plavix per Neurologist.  All emergent symptoms ruled out per Ear Hearing Change protocol, home care advice.

## 2011-11-04 NOTE — Telephone Encounter (Signed)
I know of no contra-indications for Lip-flvonoids . Don't know that it works either. After 1 year there is no advantage to taking aspirin and plavix. This should be addressed by the neurologist.

## 2011-11-05 ENCOUNTER — Ambulatory Visit (INDEPENDENT_AMBULATORY_CARE_PROVIDER_SITE_OTHER): Payer: Medicare Other

## 2011-11-05 DIAGNOSIS — D649 Anemia, unspecified: Secondary | ICD-10-CM | POA: Diagnosis not present

## 2011-11-05 MED ORDER — CYANOCOBALAMIN 1000 MCG/ML IJ SOLN
1000.0000 ug | Freq: Once | INTRAMUSCULAR | Status: AC
  Administered 2011-11-05: 1000 ug via INTRAMUSCULAR

## 2011-11-05 NOTE — Telephone Encounter (Signed)
Pt advised of same.  

## 2011-11-09 ENCOUNTER — Other Ambulatory Visit: Payer: Self-pay | Admitting: Internal Medicine

## 2011-11-23 ENCOUNTER — Other Ambulatory Visit: Payer: Self-pay | Admitting: Radiology

## 2011-11-23 DIAGNOSIS — I639 Cerebral infarction, unspecified: Secondary | ICD-10-CM

## 2011-11-24 ENCOUNTER — Telehealth (HOSPITAL_COMMUNITY): Payer: Self-pay

## 2011-11-24 NOTE — Telephone Encounter (Signed)
I spoke with Mrs/ Smola and gave her the date and time for the F/U doppler study

## 2011-12-07 ENCOUNTER — Ambulatory Visit (HOSPITAL_COMMUNITY): Payer: Medicare Other

## 2011-12-10 ENCOUNTER — Ambulatory Visit (HOSPITAL_COMMUNITY): Payer: Medicare Other

## 2011-12-14 ENCOUNTER — Ambulatory Visit (HOSPITAL_COMMUNITY)
Admission: RE | Admit: 2011-12-14 | Discharge: 2011-12-14 | Disposition: A | Discharge status: Home / Self Care | Payer: Medicare Other | Source: Ambulatory Visit | Attending: Radiology | Admitting: Radiology

## 2011-12-14 DIAGNOSIS — I639 Cerebral infarction, unspecified: Secondary | ICD-10-CM

## 2011-12-14 DIAGNOSIS — I6529 Occlusion and stenosis of unspecified carotid artery: Secondary | ICD-10-CM | POA: Insufficient documentation

## 2011-12-14 DIAGNOSIS — I6340 Cerebral infarction due to embolism of unspecified cerebral artery: Secondary | ICD-10-CM

## 2011-12-14 DIAGNOSIS — Z48812 Encounter for surgical aftercare following surgery on the circulatory system: Secondary | ICD-10-CM | POA: Diagnosis not present

## 2011-12-14 NOTE — Progress Notes (Signed)
VASCULAR LAB PRELIMINARY  PRELIMINARY  PRELIMINARY  PRELIMINARY  Carotid duplex has been completed.    Preliminary report: The right ICA stent is patent without restenosis.  Mild plaque in left CCA and ICA.  No ICA stenosis on left.    Anyela Napierkowski, 12/14/2011, 4:21 PM

## 2011-12-16 ENCOUNTER — Telehealth (HOSPITAL_COMMUNITY): Payer: Self-pay

## 2011-12-16 NOTE — Telephone Encounter (Signed)
Dr. Corliss Skains reviewed the Doppler Study.  He advised that everything was stable and he would repeat doppler in 6 months.

## 2011-12-22 ENCOUNTER — Other Ambulatory Visit: Payer: Self-pay | Admitting: Internal Medicine

## 2012-01-06 ENCOUNTER — Ambulatory Visit (INDEPENDENT_AMBULATORY_CARE_PROVIDER_SITE_OTHER): Payer: Medicare Other | Admitting: *Deleted

## 2012-01-06 DIAGNOSIS — D649 Anemia, unspecified: Secondary | ICD-10-CM | POA: Diagnosis not present

## 2012-01-06 DIAGNOSIS — Z23 Encounter for immunization: Secondary | ICD-10-CM | POA: Diagnosis not present

## 2012-01-06 MED ORDER — CYANOCOBALAMIN 1000 MCG/ML IJ SOLN
1000.0000 ug | Freq: Once | INTRAMUSCULAR | Status: AC
  Administered 2012-01-06: 1000 ug via INTRAMUSCULAR

## 2012-01-07 ENCOUNTER — Other Ambulatory Visit: Payer: Self-pay | Admitting: Internal Medicine

## 2012-01-25 DIAGNOSIS — I6340 Cerebral infarction due to embolism of unspecified cerebral artery: Secondary | ICD-10-CM | POA: Diagnosis not present

## 2012-02-19 ENCOUNTER — Other Ambulatory Visit: Payer: Self-pay | Admitting: Internal Medicine

## 2012-02-23 ENCOUNTER — Ambulatory Visit (INDEPENDENT_AMBULATORY_CARE_PROVIDER_SITE_OTHER): Payer: Medicare Other

## 2012-02-23 DIAGNOSIS — D649 Anemia, unspecified: Secondary | ICD-10-CM | POA: Diagnosis not present

## 2012-02-23 MED ORDER — CYANOCOBALAMIN 1000 MCG/ML IJ SOLN
1000.0000 ug | Freq: Once | INTRAMUSCULAR | Status: AC
  Administered 2012-02-23: 1000 ug via INTRAMUSCULAR

## 2012-03-09 ENCOUNTER — Telehealth: Payer: Self-pay | Admitting: *Deleted

## 2012-03-09 DIAGNOSIS — E785 Hyperlipidemia, unspecified: Secondary | ICD-10-CM

## 2012-03-09 DIAGNOSIS — E119 Type 2 diabetes mellitus without complications: Secondary | ICD-10-CM

## 2012-03-09 NOTE — Telephone Encounter (Signed)
Orders entered for Mr. Corine Shelter

## 2012-03-09 NOTE — Telephone Encounter (Signed)
Spoke with pt's wife and she asked if Dr Debby Bud would order pt's bloodwork to be done at our lab; cholesterol and HgbA1C. Pt prefers to come here than to another office. Please advise.

## 2012-03-10 ENCOUNTER — Telehealth: Payer: Self-pay | Admitting: *Deleted

## 2012-03-10 NOTE — Telephone Encounter (Signed)
Called pt and spoke with pt's wife, told her that the orders for lab work is in for him to come in anytime to have them completed.

## 2012-03-17 ENCOUNTER — Other Ambulatory Visit (INDEPENDENT_AMBULATORY_CARE_PROVIDER_SITE_OTHER): Payer: Medicare Other

## 2012-03-17 DIAGNOSIS — E785 Hyperlipidemia, unspecified: Secondary | ICD-10-CM | POA: Diagnosis not present

## 2012-03-17 DIAGNOSIS — E119 Type 2 diabetes mellitus without complications: Secondary | ICD-10-CM

## 2012-03-17 LAB — HEPATIC FUNCTION PANEL
ALT: 35 U/L (ref 0–53)
Bilirubin, Direct: 0.1 mg/dL (ref 0.0–0.3)
Total Bilirubin: 1 mg/dL (ref 0.3–1.2)

## 2012-03-17 LAB — LIPID PANEL
HDL: 36.3 mg/dL — ABNORMAL LOW (ref >39.00)
LDL Cholesterol: 77 mg/dL (ref 0–99)
Total CHOL/HDL Ratio: 4
VLDL: 29 mg/dL (ref 0.0–40.0)

## 2012-03-17 LAB — HEMOGLOBIN A1C: Hgb A1c MFr Bld: 7 % — ABNORMAL HIGH (ref 4.6–6.5)

## 2012-03-20 ENCOUNTER — Encounter: Payer: Self-pay | Admitting: Internal Medicine

## 2012-04-11 ENCOUNTER — Telehealth: Payer: Self-pay | Admitting: Pulmonary Disease

## 2012-04-11 ENCOUNTER — Encounter: Payer: Self-pay | Admitting: Pulmonary Disease

## 2012-04-11 ENCOUNTER — Ambulatory Visit (INDEPENDENT_AMBULATORY_CARE_PROVIDER_SITE_OTHER): Payer: Medicare Other | Admitting: Pulmonary Disease

## 2012-04-11 VITALS — BP 110/72 | HR 71 | Temp 97.8°F | Ht 70.5 in | Wt 197.6 lb

## 2012-04-11 DIAGNOSIS — G4733 Obstructive sleep apnea (adult) (pediatric): Secondary | ICD-10-CM | POA: Diagnosis not present

## 2012-04-11 NOTE — Telephone Encounter (Signed)
Spoke with pt's spouse She states that he is due to have B-12 inj today Normally gets this at Dr. Debby Bud office but they have no appts today, and since he is coming to see VS, wanted to know if he can get inj here I advised that I am unsure if he will feel comfortable with this, but can ask him when he comes in Nothing further needed

## 2012-04-11 NOTE — Patient Instructions (Signed)
Will get CPAP report and call with results Follow up in one year 
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
## 2012-04-11 NOTE — Assessment & Plan Note (Signed)
He reports compliance with therapy and benefit from CPAP.  Will get his CPAP report and call him with results.

## 2012-04-18 ENCOUNTER — Ambulatory Visit (INDEPENDENT_AMBULATORY_CARE_PROVIDER_SITE_OTHER): Payer: Medicare Other | Admitting: *Deleted

## 2012-04-18 DIAGNOSIS — E538 Deficiency of other specified B group vitamins: Secondary | ICD-10-CM

## 2012-04-18 MED ORDER — CYANOCOBALAMIN 1000 MCG/ML IJ SOLN
1000.0000 ug | Freq: Once | INTRAMUSCULAR | Status: AC
  Administered 2012-04-18: 1000 ug via INTRAMUSCULAR

## 2012-05-27 ENCOUNTER — Telehealth: Payer: Self-pay | Admitting: Pulmonary Disease

## 2012-05-27 NOTE — Telephone Encounter (Signed)
Auto CPAP 02/18/12 to 05/17/12 >> Used on 60 of 90 nights with average 6 hrs 32 min.  Average AHI 0.6 with median CPAP 6 cm H2O and 95th percentile CPAP 8 cm H2O.  Will have my nurse inform pt that CPAP report looks good.  No change to current set up.

## 2012-05-31 NOTE — Telephone Encounter (Signed)
I spoke with patient about results and he verbalized understanding and had no questions 

## 2012-06-08 DIAGNOSIS — J029 Acute pharyngitis, unspecified: Secondary | ICD-10-CM | POA: Diagnosis not present

## 2012-06-22 ENCOUNTER — Ambulatory Visit: Payer: Medicare Other

## 2012-06-27 ENCOUNTER — Encounter: Payer: Self-pay | Admitting: Internal Medicine

## 2012-06-27 ENCOUNTER — Ambulatory Visit: Payer: Medicare Other

## 2012-06-27 ENCOUNTER — Other Ambulatory Visit: Payer: Self-pay | Admitting: Internal Medicine

## 2012-06-27 ENCOUNTER — Other Ambulatory Visit (INDEPENDENT_AMBULATORY_CARE_PROVIDER_SITE_OTHER): Payer: Medicare Other

## 2012-06-27 ENCOUNTER — Ambulatory Visit (INDEPENDENT_AMBULATORY_CARE_PROVIDER_SITE_OTHER): Payer: Medicare Other | Admitting: Internal Medicine

## 2012-06-27 VITALS — BP 138/88 | HR 70 | Temp 97.0°F | Ht 70.5 in | Wt 200.2 lb

## 2012-06-27 DIAGNOSIS — E785 Hyperlipidemia, unspecified: Secondary | ICD-10-CM

## 2012-06-27 DIAGNOSIS — F411 Generalized anxiety disorder: Secondary | ICD-10-CM

## 2012-06-27 DIAGNOSIS — E119 Type 2 diabetes mellitus without complications: Secondary | ICD-10-CM | POA: Diagnosis not present

## 2012-06-27 DIAGNOSIS — E538 Deficiency of other specified B group vitamins: Secondary | ICD-10-CM

## 2012-06-27 DIAGNOSIS — R011 Cardiac murmur, unspecified: Secondary | ICD-10-CM | POA: Diagnosis not present

## 2012-06-27 DIAGNOSIS — IMO0001 Reserved for inherently not codable concepts without codable children: Secondary | ICD-10-CM

## 2012-06-27 HISTORY — DX: Deficiency of other specified B group vitamins: E53.8

## 2012-06-27 LAB — HEPATIC FUNCTION PANEL
Albumin: 3.9 g/dL (ref 3.5–5.2)
Alkaline Phosphatase: 75 U/L (ref 39–117)
Bilirubin, Direct: 0.1 mg/dL (ref 0.0–0.3)

## 2012-06-27 LAB — LIPID PANEL
HDL: 31.7 mg/dL — ABNORMAL LOW (ref >39.00)
Total CHOL/HDL Ratio: 4
Triglycerides: 230 mg/dL — ABNORMAL HIGH (ref 0.0–149.0)

## 2012-06-27 LAB — BASIC METABOLIC PANEL
Calcium: 9.3 mg/dL (ref 8.4–10.5)
GFR: 68.23 mL/min (ref >60.00)
Sodium: 137 mEq/L (ref 135–145)

## 2012-06-27 MED ORDER — CITALOPRAM HYDROBROMIDE 10 MG PO TABS: 10.0000 mg | ORAL_TABLET | Freq: Every day | ORAL | Status: DC

## 2012-06-27 MED ORDER — CYANOCOBALAMIN 1000 MCG/ML IJ SOLN
1000.0000 ug | Freq: Once | INTRAMUSCULAR | Status: AC
  Administered 2012-06-27: 1000 ug via INTRAMUSCULAR

## 2012-06-27 MED ORDER — GLIMEPIRIDE 1 MG PO TABS: 1.0000 mg | ORAL_TABLET | Freq: Every day | ORAL | Status: DC

## 2012-06-27 NOTE — Assessment & Plan Note (Signed)
.  stable overall by history and exam, recent data reviewed with pt, and pt to continue medical treatment as before,  to f/u any worsening symptoms or concerns Lab Results  Component Value Date   LDLCALC 77 03/17/2012

## 2012-06-27 NOTE — Assessment & Plan Note (Signed)
Ok to add citalopram 10 qd

## 2012-06-27 NOTE — Assessment & Plan Note (Signed)
stable overall by history and exam, recent data reviewed with pt, and pt to continue medical treatment as before,  to f/u any worsening symptoms or concerns  Lab Results  Component Value Date   HGBA1C 7.0* 03/17/2012
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
## 2012-06-27 NOTE — Assessment & Plan Note (Signed)
For his replacement IM today

## 2012-06-27 NOTE — Patient Instructions (Addendum)
You had the B12 shot today Please take all new medication as prescribed - the citalopram 10 mg You will be contacted regarding the referral for: Echocardiogram You will be contacted regarding the referral for: Cardiology/Dr Crenshaw Please continue all other medications as before, and refills have been done if requested. Please go to the LAB in the Basement (turn left off the elevator) for the tests to be done today You will be contacted by phone if any changes need to be made immediately.  Otherwise, you will receive a letter about your results with an explanation Please remember to sign up for My Chart if you have not done so, as this will be important to you in the future with finding out test results, communicating by private email, and scheduling acute appointments online when needed.

## 2012-06-27 NOTE — Assessment & Plan Note (Addendum)
My first time seeing pt, but has gr 2-3 sys Murmur, RUSB , no AS on June 2013 transesoph echo but with mild septal thickening/no signficant LVOT  Pt now c/o worsening dyspnea/sob; it hasnt been a yr, but with symptoms change will ask for transthoracic echo as it is in the pt best interest, also due for f/u Cardiology

## 2012-06-30 ENCOUNTER — Ambulatory Visit (HOSPITAL_COMMUNITY): Payer: Medicare Other | Attending: Internal Medicine

## 2012-06-30 DIAGNOSIS — E119 Type 2 diabetes mellitus without complications: Secondary | ICD-10-CM | POA: Diagnosis not present

## 2012-06-30 DIAGNOSIS — E785 Hyperlipidemia, unspecified: Secondary | ICD-10-CM | POA: Diagnosis not present

## 2012-06-30 DIAGNOSIS — R0989 Other specified symptoms and signs involving the circulatory and respiratory systems: Secondary | ICD-10-CM | POA: Insufficient documentation

## 2012-06-30 DIAGNOSIS — R0609 Other forms of dyspnea: Secondary | ICD-10-CM | POA: Diagnosis not present

## 2012-06-30 DIAGNOSIS — R0602 Shortness of breath: Secondary | ICD-10-CM | POA: Insufficient documentation

## 2012-06-30 DIAGNOSIS — R5381 Other malaise: Secondary | ICD-10-CM | POA: Insufficient documentation

## 2012-06-30 DIAGNOSIS — I2510 Atherosclerotic heart disease of native coronary artery without angina pectoris: Secondary | ICD-10-CM | POA: Insufficient documentation

## 2012-06-30 DIAGNOSIS — I6529 Occlusion and stenosis of unspecified carotid artery: Secondary | ICD-10-CM | POA: Diagnosis not present

## 2012-06-30 DIAGNOSIS — R011 Cardiac murmur, unspecified: Secondary | ICD-10-CM

## 2012-06-30 DIAGNOSIS — I10 Essential (primary) hypertension: Secondary | ICD-10-CM | POA: Diagnosis not present

## 2012-06-30 NOTE — Progress Notes (Signed)
Echocardiogram performed.  

## 2012-07-06 ENCOUNTER — Telehealth: Payer: Self-pay | Admitting: Cardiology

## 2012-07-06 NOTE — Telephone Encounter (Signed)
ERROR PLEASE DELETE 

## 2012-07-06 NOTE — Telephone Encounter (Signed)
New Problem:    Patient's wife called in wanting to speak with you about what has been going on with her husband.  Please call back.

## 2012-07-18 ENCOUNTER — Other Ambulatory Visit: Payer: Self-pay | Admitting: *Deleted

## 2012-07-18 ENCOUNTER — Ambulatory Visit (INDEPENDENT_AMBULATORY_CARE_PROVIDER_SITE_OTHER): Payer: Medicare Other | Admitting: Neurology

## 2012-07-18 ENCOUNTER — Encounter: Payer: Self-pay | Admitting: Neurology

## 2012-07-18 VITALS — BP 151/77 | HR 83 | Temp 98.3°F | Ht 71.0 in | Wt 193.0 lb

## 2012-07-18 DIAGNOSIS — I6350 Cerebral infarction due to unspecified occlusion or stenosis of unspecified cerebral artery: Secondary | ICD-10-CM | POA: Diagnosis not present

## 2012-07-18 DIAGNOSIS — R29898 Other symptoms and signs involving the musculoskeletal system: Secondary | ICD-10-CM

## 2012-07-18 DIAGNOSIS — M47814 Spondylosis without myelopathy or radiculopathy, thoracic region: Secondary | ICD-10-CM

## 2012-07-18 DIAGNOSIS — I639 Cerebral infarction, unspecified: Secondary | ICD-10-CM

## 2012-07-18 NOTE — Patient Instructions (Addendum)
We will order carotid doppler study and MRI of cervical, thoracic and lumbar spine. Return for follow up in 3 months.   STROKE/TIA INSTRUCTIONS SMOKING Cigarette smoking nearly doubles your risk of having a stroke & is the single most alterable risk factor  If you smoke or have smoked in the last 12 months, you are advised to quit smoking for your health.  Most of the excess cardiovascular risk related to smoking disappears within a year of stopping.  Ask you doctor about anti-smoking medications  [place] Quit Line: 1-800-QUIT NOW  Free Smoking Cessation Classes [phone]  CHOLESTEROL Know your levels; limit fat & cholesterol in your diet  Lab Results  Component Value Date   CHOL 141 06/27/2012   HDL 31.70* 06/27/2012   LDLCALC 77 03/17/2012   LDLDIRECT 89.5 06/27/2012   TRIG 230.0* 06/27/2012   CHOLHDL 4 06/27/2012      Many patients benefit from treatment even if their cholesterol is at goal.  Goal: Total Cholesterol less than 160  Goal:  LDL less than 100  Goal:  HDL greater than 40  Goal:  Triglycerides less than 150  BLOOD PRESSURE American Stroke Association blood pressure target is less that 120/80 mm/Hg  Your discharge blood pressure is:  BP: 151/77 mmHg  Monitor your blood pressure  Limit your salt and alcohol intake  Many individuals will require more than one medication for high blood pressure  DIABETES (A1c is a blood sugar average for last 3 months) Goal A1c is under 7% (A1c is blood sugar average for last 3 months)  Diabetes: Diagnosis of diabetes:  A1c was not drawn this admission    Lab Results  Component Value Date   HGBA1C 7.4* 06/27/2012    Your A1c can be lowered with medications, healthy diet, and exercise.  Check your blood sugar as directed by your physician  Call your physician if you experience unexplained or low blood sugars.  PHYSICAL ACTIVITY/REHABILITATION Goal is 30 minutes at least 4 days per week    Activity decreases your risk of heart  attack and stroke and makes your heart stronger.  It helps control your weight and blood pressure; helps you relax and can improve your mood.  Participate in a regular exercise program.  Talk with your doctor about the best form of exercise for you (dancing, walking, swimming, cycling).  DIET/WEIGHT Goal is to maintain a healthy weight  Your height is:  Height: 5\' 11"  (180.3 cm) Your current weight is: Weight: 193 lb (87.544 kg) Your body Mass Index (BMI) is:  BMI (Calculated): 27  Following the type of diet specifically designed for you will help prevent another stroke.  Your goal Body Mass Index (BMI) is 19-24.  Healthy food habits can help reduce 3 risk factors for stroke:  High cholesterol, hypertension, and excess weight.
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
## 2012-07-19 ENCOUNTER — Ambulatory Visit (INDEPENDENT_AMBULATORY_CARE_PROVIDER_SITE_OTHER)
Admission: RE | Admit: 2012-07-19 | Discharge: 2012-07-19 | Disposition: A | Discharge status: Home / Self Care | Payer: Medicare Other | Source: Ambulatory Visit | Attending: Internal Medicine | Admitting: Internal Medicine

## 2012-07-19 ENCOUNTER — Institutional Professional Consult (permissible substitution): Payer: Medicare Other | Admitting: Cardiology

## 2012-07-19 ENCOUNTER — Encounter: Payer: Self-pay | Admitting: Internal Medicine

## 2012-07-19 ENCOUNTER — Ambulatory Visit (INDEPENDENT_AMBULATORY_CARE_PROVIDER_SITE_OTHER): Payer: Medicare Other | Admitting: Internal Medicine

## 2012-07-19 ENCOUNTER — Ambulatory Visit: Payer: Medicare Other | Admitting: Cardiology

## 2012-07-19 VITALS — BP 140/70 | HR 73 | Temp 97.1°F | Ht 70.5 in | Wt 193.4 lb

## 2012-07-19 DIAGNOSIS — R079 Chest pain, unspecified: Secondary | ICD-10-CM | POA: Diagnosis not present

## 2012-07-19 DIAGNOSIS — J209 Acute bronchitis, unspecified: Secondary | ICD-10-CM | POA: Insufficient documentation

## 2012-07-19 DIAGNOSIS — E119 Type 2 diabetes mellitus without complications: Secondary | ICD-10-CM | POA: Diagnosis not present

## 2012-07-19 DIAGNOSIS — F329 Major depressive disorder, single episode, unspecified: Secondary | ICD-10-CM | POA: Diagnosis not present

## 2012-07-19 DIAGNOSIS — J069 Acute upper respiratory infection, unspecified: Secondary | ICD-10-CM | POA: Insufficient documentation

## 2012-07-19 DIAGNOSIS — R05 Cough: Secondary | ICD-10-CM | POA: Diagnosis not present

## 2012-07-19 MED ORDER — HYDROCODONE-HOMATROPINE 5-1.5 MG/5ML PO SYRP: 5.0000 mL | ORAL_SOLUTION | Freq: Four times a day (QID) | ORAL | Status: DC | PRN

## 2012-07-19 MED ORDER — LEVOFLOXACIN 250 MG PO TABS: 250.0000 mg | ORAL_TABLET | Freq: Every day | ORAL | Status: DC
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
## 2012-07-19 NOTE — Assessment & Plan Note (Signed)
Improved, The current medical regimen is effective;  continue present plan and medications.  

## 2012-07-19 NOTE — Patient Instructions (Signed)
Please take all new medication as prescribed - the antibiotic, and cough medicine Please continue all other medications as before Please go to the XRAY Department in the Basement (go straight as you get off the elevator) for the x-ray testing Please keep your appointments with your specialists as you have planned You will be contacted by phone if any changes need to be made immediately.  Otherwise, you will receive a letter about your results with an explanation Please remember to sign up for My Chart if you have not done so, as this will be important to you in the future with finding out test results, communicating by private email, and scheduling acute appointments online when needed. Please return in 3 months, or sooner if needed, to Dr Debby Bud

## 2012-07-19 NOTE — Assessment & Plan Note (Addendum)
Mild to mod, for antibx course,  to f/u any worsening symptoms or concerns, for cough med prn, cant r/o pna - for cxr

## 2012-07-19 NOTE — Assessment & Plan Note (Signed)
stable overall by history and exam, recent data reviewed with pt, and pt to continue medical treatment as before,  to f/u any worsening symptoms or concerns  Lab Results  Component Value Date   HGBA1C 7.4* 06/27/2012

## 2012-07-21 ENCOUNTER — Encounter: Payer: Self-pay | Admitting: Cardiology

## 2012-07-26 ENCOUNTER — Telehealth: Payer: Self-pay | Admitting: *Deleted

## 2012-07-26 DIAGNOSIS — R29898 Other symptoms and signs involving the musculoskeletal system: Secondary | ICD-10-CM

## 2012-07-26 NOTE — Telephone Encounter (Signed)
Sherry with [facility] calling for new orders for the MRI Cspine, Thoracic spine and Lumbar spine w/o contrast.

## 2012-07-27 ENCOUNTER — Ambulatory Visit (INDEPENDENT_AMBULATORY_CARE_PROVIDER_SITE_OTHER): Payer: Medicare Other | Admitting: Nurse Practitioner

## 2012-07-27 ENCOUNTER — Encounter: Payer: Self-pay | Admitting: Nurse Practitioner

## 2012-07-27 VITALS — BP 150/76 | HR 64 | Ht 70.5 in | Wt 192.1 lb

## 2012-07-27 DIAGNOSIS — I350 Nonrheumatic aortic (valve) stenosis: Secondary | ICD-10-CM

## 2012-07-27 DIAGNOSIS — I359 Nonrheumatic aortic valve disorder, unspecified: Secondary | ICD-10-CM | POA: Diagnosis not present
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
## 2012-07-27 NOTE — Patient Instructions (Addendum)
Your ultrasound of your heart shows mild aortic stenosis  This will need to be followed every year or so  Let us know if you have any chest pain/shortness of breath or passing out spells  See Dr. Jens Som in the fall  Call the [facility] office at [phone] if you have any questions, problems or concerns.

## 2012-08-04 ENCOUNTER — Ambulatory Visit
Admission: RE | Admit: 2012-08-04 | Discharge: 2012-08-04 | Disposition: A | Discharge status: Home / Self Care | Payer: Medicare Other | Source: Ambulatory Visit | Attending: Neurology | Admitting: Neurology

## 2012-08-04 DIAGNOSIS — M79609 Pain in unspecified limb: Secondary | ICD-10-CM | POA: Diagnosis not present

## 2012-08-04 DIAGNOSIS — R29898 Other symptoms and signs involving the musculoskeletal system: Secondary | ICD-10-CM

## 2012-08-04 DIAGNOSIS — R29818 Other symptoms and signs involving the nervous system: Secondary | ICD-10-CM | POA: Diagnosis not present

## 2012-08-13 ENCOUNTER — Other Ambulatory Visit: Payer: Self-pay | Admitting: Internal Medicine

## 2012-08-15 ENCOUNTER — Other Ambulatory Visit: Payer: Self-pay | Admitting: Internal Medicine

## 2012-08-15 NOTE — Telephone Encounter (Signed)
Okay for clonazepam refill?

## 2012-08-15 NOTE — Telephone Encounter (Signed)
Clonazepam called to pharmacy  

## 2012-08-23 ENCOUNTER — Other Ambulatory Visit: Payer: Self-pay | Admitting: Nurse Practitioner

## 2012-08-23 ENCOUNTER — Other Ambulatory Visit: Payer: Self-pay | Admitting: Radiology

## 2012-08-23 DIAGNOSIS — I6529 Occlusion and stenosis of unspecified carotid artery: Secondary | ICD-10-CM

## 2012-08-23 DIAGNOSIS — I639 Cerebral infarction, unspecified: Secondary | ICD-10-CM

## 2012-08-23 NOTE — Progress Notes (Signed)
Entered order for repeat carotid dopplers, referring to 07/18/12 office visit.

## 2012-08-29 ENCOUNTER — Ambulatory Visit (HOSPITAL_COMMUNITY)
Admission: RE | Admit: 2012-08-29 | Discharge: 2012-08-29 | Disposition: A | Discharge status: Home / Self Care | Payer: Medicare Other | Source: Ambulatory Visit | Attending: Interventional Radiology | Admitting: Interventional Radiology

## 2012-08-29 DIAGNOSIS — I6529 Occlusion and stenosis of unspecified carotid artery: Secondary | ICD-10-CM | POA: Insufficient documentation

## 2012-08-29 DIAGNOSIS — D692 Other nonthrombocytopenic purpura: Secondary | ICD-10-CM | POA: Diagnosis not present

## 2012-08-29 DIAGNOSIS — L819 Disorder of pigmentation, unspecified: Secondary | ICD-10-CM | POA: Diagnosis not present

## 2012-08-29 DIAGNOSIS — L578 Other skin changes due to chronic exposure to nonionizing radiation: Secondary | ICD-10-CM | POA: Diagnosis not present

## 2012-08-29 DIAGNOSIS — L821 Other seborrheic keratosis: Secondary | ICD-10-CM | POA: Diagnosis not present

## 2012-08-29 DIAGNOSIS — Z85828 Personal history of other malignant neoplasm of skin: Secondary | ICD-10-CM | POA: Diagnosis not present

## 2012-08-29 NOTE — Progress Notes (Signed)
VASCULAR LAB PRELIMINARY  PRELIMINARY  PRELIMINARY  PRELIMINARY  Carotid duplex completed.    Preliminary report:  Right ICA stent is visible, patent, and without increase in velocities. Bilateral ICA shows no hemodynamically significant  stenosis. Vertebral arteries are antegrade.  Andrew Malone, RVS 08/29/2012, 9:59 AM

## 2012-09-01 ENCOUNTER — Telehealth: Payer: Self-pay | Admitting: *Deleted

## 2012-09-01 NOTE — Telephone Encounter (Signed)
LMVM fo pt that to call back for MRI results.

## 2012-09-01 NOTE — Telephone Encounter (Signed)
Message copied by Hermenia Fiscal on Thu Sep 01, 2012  3:28 PM ------      Message from: [facility], [place]      Created: Fri Aug 19, 2012  3:33 PM      Contact: pt       Pt is calling to get results from MRI he had 2 weeks ago.  Please call and advise. ------

## 2012-09-05 NOTE — Telephone Encounter (Signed)
I called and spoke to Andrew Malone and then to wife and gave the results of the MRI (cevical, lumbar and throacic).  Has been having headaches and will f/u with Dr. Corliss Skains about these.

## 2012-09-08 ENCOUNTER — Other Ambulatory Visit: Payer: Self-pay | Admitting: Internal Medicine

## 2012-09-15 ENCOUNTER — Telehealth: Payer: Self-pay | Admitting: *Deleted

## 2012-09-15 NOTE — Telephone Encounter (Signed)
Left msg on triage stating wanting to see if pt need to get pre-med before any dental treatment. Pls fax md response to [phone]...lmb

## 2012-09-15 NOTE — Telephone Encounter (Signed)
Reviewed recent 2 D echo. No valvular disease and no need for prophylactic anti-biotics.

## 2012-09-16 ENCOUNTER — Telehealth: Payer: Self-pay

## 2012-09-16 NOTE — Telephone Encounter (Signed)
Faxed msg to Dr. Excell Seltzer @ [phone]...lmb

## 2012-09-16 NOTE — Telephone Encounter (Signed)
(  See previous phone note) Jazmine from Dr. Excell Seltzer office called stating that the received fax stating no need for prophylactic anti-biotics prior to dental cleanings. She states that they need another letter that covers "all dental treatment". I checked Epic letters, whoever I do not see in the system. Thanks

## 2012-09-29 ENCOUNTER — Telehealth: Payer: Self-pay | Admitting: *Deleted

## 2012-09-29 ENCOUNTER — Encounter: Payer: Self-pay | Admitting: Internal Medicine

## 2012-09-29 ENCOUNTER — Ambulatory Visit (INDEPENDENT_AMBULATORY_CARE_PROVIDER_SITE_OTHER): Payer: Medicare Other | Admitting: Internal Medicine

## 2012-09-29 VITALS — BP 130/82 | HR 67 | Temp 97.0°F | Ht 70.5 in | Wt 192.2 lb

## 2012-09-29 DIAGNOSIS — R21 Rash and other nonspecific skin eruption: Secondary | ICD-10-CM | POA: Diagnosis not present

## 2012-09-29 DIAGNOSIS — I10 Essential (primary) hypertension: Secondary | ICD-10-CM | POA: Diagnosis not present

## 2012-09-29 DIAGNOSIS — E119 Type 2 diabetes mellitus without complications: Secondary | ICD-10-CM | POA: Diagnosis not present

## 2012-09-29 MED ORDER — PREDNISONE 10 MG PO TABS: ORAL_TABLET | ORAL | Status: DC

## 2012-09-29 MED ORDER — METHYLPREDNISOLONE ACETATE 80 MG/ML IJ SUSP
80.0000 mg | Freq: Once | INTRAMUSCULAR | Status: AC
  Administered 2012-09-29: 80 mg via INTRAMUSCULAR

## 2012-09-29 NOTE — Telephone Encounter (Signed)
[facility] Triage Call Report Triage Record Num: [id number] Operator: Geanie Berlin Patient Name: Andrew Malone Call Date & Time: 09/28/2012 5:11:23PM Patient Phone: [phone] PCP: Illene Regulus Patient Gender: Male PCP Fax : [phone] Patient DOB: 1941/02/20 Practice Name: [facility] Reason for Call: Emergent Caller: Wyline Copas; PCP: Illene Regulus (Adults only); CB#: [phone]; Call regarding Rash/Hives; Onset: 09/28/12 at 1600. Afebrile. Severely itchy hives present from hips down to feet. Applied anti-itchy and aloe with little improvement. Noted another eipsode of hives within last 9-12 months. Ate spicy chicken today at work. Advised to take 50 mg Benadryl po every 6 hours. Home care advice given for all other situations per Hives guideline. Protocol(s) Used: Hives Recommended Outcome per Protocol: Provide Home/Self Care Reason for Outcome: All other situations Care Advice: ~ SYMPTOM / CONDITION MANAGEMENT Avoid allergy triggers that have caused any reaction. Read labels on food or medications carefully; ask about ingredients in food when eating out. If allergic to stinging insects, wear long-sleeved shirts and trousers and closed toe shoes. Do not walk barefoot. Ask healthcare provider about carrying emergency allergy medication. ~ If an allergy is identified, tell all healthcare providers of your allergy. Even if a first-time reaction caused mild symptoms, a future response to the same allergen may cause more serious symptoms. Wear medical identification to alert others in case of an emergency. ~ Call EMS 911 if develop signs and symptoms of anaphylaxis within minutes to several hours of exposure: severe difficulty breathing; rapid, weak or irregular pulse; pruritus, urticaria, swelling of face, lips, tongue, or throat causing tightness or difficulty swallowing; abdominal cramping, nausea, vomiting or diarrhea. ~ If immediately available, consider taking an  appropriate dose of a nonprescription antihistamine (e.g., Benadryl) orally as directed by the label or a pharmacist. These medications may cause drowsiness and should be taken with caution by adults 75 years or older. ~ 07/
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
## 2012-09-29 NOTE — Patient Instructions (Signed)
You had the steroid shot today Please take all new medication as prescribed - the prednisone Please be aware your blood sugar can increase mildly with the prednisone Please continue the benadryl as needed Please continue all other medications as before Please have the pharmacy call with any other refills you may need.  Please remember to sign up for My Chart if you have not done so, as this will be important to you in the future with finding out test results, communicating by private email, and scheduling acute appointments online when needed.

## 2012-10-02 NOTE — Assessment & Plan Note (Signed)
stable overall by history and exam, recent data reviewed with pt, and pt to continue medical treatment as before,  to f/u any worsening symptoms or concerns BP Readings from Last 3 Encounters:  09/29/12 130/82  07/27/12 150/76  07/19/12 140/70

## 2012-10-02 NOTE — Assessment & Plan Note (Signed)
stable overall by history and exam, recent data reviewed with pt, and pt to continue medical treatment as before,  to f/u any worsening symptoms or concerns Lab Results  Component Value Date   HGBA1C 7.4* 06/27/2012   Pt to closely monitor cbg on steroid, call for > 200

## 2012-10-02 NOTE — Assessment & Plan Note (Signed)
C/w allergic type rash, for depomedrol IM, predpack asd,  to f/u any worsening symptoms or concerns

## 2012-10-03 DIAGNOSIS — N401 Enlarged prostate with lower urinary tract symptoms: Secondary | ICD-10-CM | POA: Diagnosis not present

## 2012-10-03 DIAGNOSIS — R972 Elevated prostate specific antigen [PSA]: Secondary | ICD-10-CM | POA: Diagnosis not present

## 2012-10-03 DIAGNOSIS — N138 Other obstructive and reflux uropathy: Secondary | ICD-10-CM | POA: Diagnosis not present

## 2012-10-10 DIAGNOSIS — R351 Nocturia: Secondary | ICD-10-CM | POA: Diagnosis not present

## 2012-10-10 DIAGNOSIS — N401 Enlarged prostate with lower urinary tract symptoms: Secondary | ICD-10-CM | POA: Diagnosis not present

## 2012-10-10 DIAGNOSIS — R972 Elevated prostate specific antigen [PSA]: Secondary | ICD-10-CM | POA: Diagnosis not present

## 2012-10-17 ENCOUNTER — Ambulatory Visit (INDEPENDENT_AMBULATORY_CARE_PROVIDER_SITE_OTHER): Payer: Medicare Other | Admitting: Nurse Practitioner

## 2012-10-17 ENCOUNTER — Encounter: Payer: Self-pay | Admitting: Nurse Practitioner

## 2012-10-17 VITALS — BP 129/70 | HR 76 | Ht 70.5 in | Wt 193.0 lb

## 2012-10-17 DIAGNOSIS — E119 Type 2 diabetes mellitus without complications: Secondary | ICD-10-CM | POA: Diagnosis not present

## 2012-10-17 DIAGNOSIS — I639 Cerebral infarction, unspecified: Secondary | ICD-10-CM

## 2012-10-17 DIAGNOSIS — E785 Hyperlipidemia, unspecified: Secondary | ICD-10-CM | POA: Diagnosis not present

## 2012-10-17 DIAGNOSIS — R5381 Other malaise: Secondary | ICD-10-CM

## 2012-10-17 DIAGNOSIS — I6350 Cerebral infarction due to unspecified occlusion or stenosis of unspecified cerebral artery: Secondary | ICD-10-CM
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
## 2012-10-17 NOTE — Patient Instructions (Signed)
Continue clopidogrel 75 mg orally every day  for secondary stroke prevention and maintain strict control of hypertension with blood pressure goal below 130/90, diabetes with hemoglobin A1c goal below 6.5% and lipids with LDL cholesterol goal below 100 mg/dL. Followup in the future with me in 6 months.  

## 2012-10-24 ENCOUNTER — Ambulatory Visit (INDEPENDENT_AMBULATORY_CARE_PROVIDER_SITE_OTHER): Payer: Medicare Other | Admitting: Internal Medicine

## 2012-10-24 ENCOUNTER — Encounter: Payer: Self-pay | Admitting: Internal Medicine

## 2012-10-24 ENCOUNTER — Other Ambulatory Visit (INDEPENDENT_AMBULATORY_CARE_PROVIDER_SITE_OTHER): Payer: Medicare Other

## 2012-10-24 VITALS — BP 124/76 | HR 68 | Temp 98.2°F | Wt 191.0 lb

## 2012-10-24 DIAGNOSIS — E119 Type 2 diabetes mellitus without complications: Secondary | ICD-10-CM

## 2012-10-24 LAB — COMPREHENSIVE METABOLIC PANEL
ALT: 27 U/L (ref 0–53)
AST: 19 U/L (ref 0–37)
Albumin: 4 g/dL (ref 3.5–5.2)
CO2: 30 mEq/L (ref 19–32)
Calcium: 9.2 mg/dL (ref 8.4–10.5)
Chloride: 100 mEq/L (ref 96–112)
Potassium: 3.6 mEq/L (ref 3.5–5.1)
Total Protein: 7.1 g/dL (ref 6.0–8.3)

## 2012-10-24 LAB — T4, FREE: Free T4: 0.98 ng/dL (ref 0.60–1.60)

## 2012-10-24 LAB — HEMOGLOBIN A1C: Hgb A1c MFr Bld: 6.3 % (ref 4.6–6.5)

## 2012-10-24 NOTE — Patient Instructions (Addendum)
Diabetes - insulin resistance is the most likely mechanism. When you take a medication like amaryl you need to be sure to have three meals a day and a bedtime snack since the medication is constantly lowering blood sugar.   Plan Continue amaryl  Lab today: A1C, serum glucose and thyroid labs.

## 2012-10-24 NOTE — Assessment & Plan Note (Signed)
Diabetes - insulin resistance is the most likely mechanism. When you take a medication like amaryl you need to be sure to have three meals a day and a bedtime snack since the medication is constantly lowering blood sugar.   Plan Continue amaryl  Lab today: A1C, serum glucose and thyroid labs.  A1C today 6.5% adequate control.

## 2012-10-24 NOTE — Progress Notes (Signed)
Subjective:    [name]: Andrew Getting., male    DOB: 1940-12-06, 72 y.o.   MRN: [id number]  HPI Andrew Malone presents for follow up of diabetes. Chart reviewed - he has a steadily increasing A1C, last being 7.4% at which point Dr. Jonny Ruiz started him on Amaryl. Patient has been doing well but does admit to have night sweats that will awaken him about 0200 hrs. He has an early supper and does not have a bedtime snack. He has been working on a no sugar low carb diet.   PMH, FamHx and SocHx reviewed for any changes and relevance. Current Outpatient Prescriptions on File Prior to Visit  Medication Sig Dispense Refill  . acyclovir (ZOVIRAX) 200 MG capsule Take 200 mg by mouth 3 (three) times daily as needed. For herpes outbreaks      . acyclovir (ZOVIRAX) 200 MG capsule TAKE ONE CAPSULE THREE TIMES A DAY AS NEEDED  90 capsule  3  . ammonium lactate (LAC-HYDRIN) 12 % lotion Apply 1 application topically 2 (two) times daily.       Marland Kitchen aspirin EC 81 MG tablet Take 81 mg by mouth daily.       Marland Kitchen atorvastatin (LIPITOR) 20 MG tablet take 1 tablet by mouth once daily  30 tablet  6  . citalopram (CELEXA) 10 MG tablet Take 1 tablet (10 mg total) by mouth daily.  90 tablet  3  . clonazePAM (KLONOPIN) 0.5 MG tablet TAKE 1 TABLET EVERY 8 HOURS AS NEEDED  90 tablet  5  . clopidogrel (PLAVIX) 75 MG tablet take 1 tablet by mouth once daily  30 tablet  5  . glimepiride (AMARYL) 1 MG tablet Take 1 tablet (1 mg total) by mouth daily before breakfast.  90 tablet  3  . glucose blood (ONE TOUCH TEST STRIPS) test strip Use 1 strip to check blood sugar daily. Dx 250.00  50 each  3  . hydrochlorothiazide (HYDRODIURIL) 25 MG tablet take 1 tablet by mouth once daily  90 tablet  3  . HYDROcodone-homatropine (HYCODAN) 5-1.5 MG/5ML syrup Take 5 mLs by mouth every 6 (six) hours as needed for cough.  120 mL  1  . ONE TOUCH LANCETS MISC Use daily to get blood sugar. Dx 250.00  30 each  3  . pantoprazole (PROTONIX) 40 MG tablet  take 1 tablet by mouth once daily  30 tablet  5  . predniSONE (DELTASONE) 10 MG tablet 3 tabs by mouth per day for 3 days,2tabs per day for 3 days,1tab per day for 3 days  18 tablet  0  . PREVIDENT 5000 BOOSTER PLUS 1.1 % PSTE       . Tamsulosin HCl (FLOMAX) 0.4 MG CAPS Take 1 capsule by mouth at bedtime.      Marland Kitchen atorvastatin (LIPITOR) 20 MG tablet Take 20 mg by mouth daily.       No current facility-administered medications on file prior to visit.      Review of Systems System review is negative for any constitutional, cardiac, pulmonary, GI or neuro symptoms or complaints other than as described in the HPI.     Objective:   Physical Exam Filed Vitals:   10/24/12 0959  BP: 124/76  Pulse: 68  Temp: 98.2 F (36.8 C)   Wt Readings from Last 3 Encounters:  10/24/12 191 lb (86.637 kg)  10/17/12 193 lb (87.544 kg)  09/29/12 192 lb 4 oz (87.204 kg)   Gen'l- WNWD white man in no  distress HEENT - C&S clear, PERRLA Cor - RRR Pulm - normal  Neuro - awake and oriented x 3.      Assessment & Plan:

## 2012-11-01 ENCOUNTER — Encounter: Payer: Self-pay | Admitting: Internal Medicine

## 2012-11-15 ENCOUNTER — Ambulatory Visit (INDEPENDENT_AMBULATORY_CARE_PROVIDER_SITE_OTHER): Payer: Medicare Other

## 2012-11-15 DIAGNOSIS — Z23 Encounter for immunization: Secondary | ICD-10-CM | POA: Diagnosis not present

## 2012-11-15 DIAGNOSIS — E538 Deficiency of other specified B group vitamins: Secondary | ICD-10-CM

## 2012-11-15 MED ORDER — CYANOCOBALAMIN 1000 MCG/ML IJ SOLN
1000.0000 ug | Freq: Once | INTRAMUSCULAR | Status: AC
  Administered 2012-11-15: 1000 ug via INTRAMUSCULAR

## 2012-11-16 ENCOUNTER — Telehealth: Payer: Self-pay | Admitting: *Deleted

## 2012-11-16 NOTE — Telephone Encounter (Signed)
Pt called today states he has a headache after the flu and B-12 injection on yesterday.  Please advise

## 2012-11-17 ENCOUNTER — Ambulatory Visit: Payer: Medicare Other | Admitting: Internal Medicine

## 2012-11-17 NOTE — Telephone Encounter (Signed)
Headache may be from flu shot. Tylenol 1,000 mg three times a day as needed should help. Call for any muscle weakness, esp legs or breathing trouble

## 2012-11-17 NOTE — Telephone Encounter (Signed)
Spoke with pt advised of MDs message 

## 2012-11-21 ENCOUNTER — Encounter: Payer: Self-pay | Admitting: Cardiology

## 2012-11-21 ENCOUNTER — Ambulatory Visit (INDEPENDENT_AMBULATORY_CARE_PROVIDER_SITE_OTHER): Payer: Medicare Other | Admitting: Cardiology

## 2012-11-21 VITALS — BP 140/72 | HR 70 | Ht 70.5 in | Wt 194.4 lb

## 2012-11-21 DIAGNOSIS — I10 Essential (primary) hypertension: Secondary | ICD-10-CM

## 2012-11-21 DIAGNOSIS — I359 Nonrheumatic aortic valve disorder, unspecified: Secondary | ICD-10-CM | POA: Diagnosis not present

## 2012-11-21 DIAGNOSIS — I6350 Cerebral infarction due to unspecified occlusion or stenosis of unspecified cerebral artery: Secondary | ICD-10-CM

## 2012-11-21 DIAGNOSIS — I350 Nonrheumatic aortic (valve) stenosis: Secondary | ICD-10-CM

## 2012-11-21 DIAGNOSIS — E785 Hyperlipidemia, unspecified: Secondary | ICD-10-CM | POA: Diagnosis not present

## 2012-11-21 DIAGNOSIS — I2510 Atherosclerotic heart disease of native coronary artery without angina pectoris: Secondary | ICD-10-CM

## 2012-11-21 NOTE — Assessment & Plan Note (Signed)
Continue aspirin and statin. 

## 2012-11-21 NOTE — Assessment & Plan Note (Signed)
Prior carotid stent. Followed by interventional radiology. Continue aspirin and statin.
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
## 2012-11-21 NOTE — Assessment & Plan Note (Signed)
Continue statin. Lipids and liver monitored by primary care. 

## 2012-11-21 NOTE — Assessment & Plan Note (Signed)
Blood pressure is controlled

## 2012-11-21 NOTE — Patient Instructions (Addendum)
Your physician wants you to follow-up in: ONE YEAR WITH DR CRENSHAW You will receive a reminder letter in the mail two months in advance. If you don't receive a letter, please call our office to schedule the follow-up appointment.  

## 2012-11-21 NOTE — Assessment & Plan Note (Signed)
Mild on most recent echocardiogram. Plan followup studies in the future.

## 2012-11-30 ENCOUNTER — Other Ambulatory Visit (HOSPITAL_COMMUNITY): Payer: Self-pay | Admitting: Interventional Radiology

## 2012-11-30 DIAGNOSIS — I639 Cerebral infarction, unspecified: Secondary | ICD-10-CM

## 2012-12-12 ENCOUNTER — Ambulatory Visit (HOSPITAL_COMMUNITY)
Admission: RE | Admit: 2012-12-12 | Discharge: 2012-12-12 | Disposition: A | Discharge status: Home / Self Care | Payer: Medicare Other | Source: Ambulatory Visit | Attending: Interventional Radiology | Admitting: Interventional Radiology

## 2012-12-12 DIAGNOSIS — E7800 Pure hypercholesterolemia, unspecified: Secondary | ICD-10-CM | POA: Diagnosis not present

## 2012-12-12 DIAGNOSIS — R29898 Other symptoms and signs involving the musculoskeletal system: Secondary | ICD-10-CM | POA: Insufficient documentation

## 2012-12-12 DIAGNOSIS — E119 Type 2 diabetes mellitus without complications: Secondary | ICD-10-CM | POA: Diagnosis not present

## 2012-12-12 DIAGNOSIS — I639 Cerebral infarction, unspecified: Secondary | ICD-10-CM

## 2012-12-12 DIAGNOSIS — Z9889 Other specified postprocedural states: Secondary | ICD-10-CM | POA: Insufficient documentation

## 2012-12-12 DIAGNOSIS — I10 Essential (primary) hypertension: Secondary | ICD-10-CM | POA: Insufficient documentation

## 2012-12-12 DIAGNOSIS — R51 Headache: Secondary | ICD-10-CM | POA: Diagnosis not present

## 2012-12-12 DIAGNOSIS — R5381 Other malaise: Secondary | ICD-10-CM | POA: Diagnosis not present

## 2012-12-12 DIAGNOSIS — Z8673 Personal history of transient ischemic attack (TIA), and cerebral infarction without residual deficits: Secondary | ICD-10-CM | POA: Insufficient documentation

## 2012-12-12 LAB — PLATELET INHIBITION P2Y12: Platelet Function  P2Y12: 108 [PRU] — ABNORMAL LOW (ref 194–418)

## 2012-12-22 ENCOUNTER — Telehealth: Payer: Self-pay | Admitting: Internal Medicine

## 2012-12-22 NOTE — Telephone Encounter (Signed)
Ok with me 

## 2012-12-22 NOTE — Telephone Encounter (Signed)
Andrew Malone saw Dr. Jonny Ruiz while Dr. Debby Bud was out in the Spring.  He is aware that Dr. Debby Bud is retiring and would like for Dr. Jonny Ruiz to become their doctor when he leaves.  Will this be OK?

## 2012-12-23 NOTE — Telephone Encounter (Signed)
Pt's wife is aware.  They will make appts with Dr. Jonny Ruiz after Dr. Michael Litter.

## 2013-01-16 ENCOUNTER — Ambulatory Visit (INDEPENDENT_AMBULATORY_CARE_PROVIDER_SITE_OTHER): Payer: Medicare Other

## 2013-01-16 DIAGNOSIS — E538 Deficiency of other specified B group vitamins: Secondary | ICD-10-CM

## 2013-01-16 MED ORDER — CYANOCOBALAMIN 1000 MCG/ML IJ SOLN
1000.0000 ug | Freq: Once | INTRAMUSCULAR | Status: AC
  Administered 2013-01-16: 1000 ug via INTRAMUSCULAR

## 2013-01-30 ENCOUNTER — Other Ambulatory Visit: Payer: Self-pay | Admitting: Internal Medicine

## 2013-02-03 ENCOUNTER — Other Ambulatory Visit: Payer: Self-pay | Admitting: Internal Medicine

## 2013-02-10 ENCOUNTER — Other Ambulatory Visit: Payer: Self-pay

## 2013-02-10 MED ORDER — CLOPIDOGREL BISULFATE 75 MG PO TABS: 75.0000 mg | ORAL_TABLET | Freq: Every day | ORAL | Status: DC

## 2013-02-13 ENCOUNTER — Ambulatory Visit (INDEPENDENT_AMBULATORY_CARE_PROVIDER_SITE_OTHER): Payer: Medicare Other

## 2013-02-13 ENCOUNTER — Other Ambulatory Visit: Payer: Self-pay

## 2013-02-13 DIAGNOSIS — Z23 Encounter for immunization: Secondary | ICD-10-CM | POA: Diagnosis not present

## 2013-02-13 MED ORDER — PANTOPRAZOLE SODIUM 40 MG PO TBEC: 40.0000 mg | DELAYED_RELEASE_TABLET | Freq: Every day | ORAL | Status: DC

## 2013-03-06 DIAGNOSIS — E1149 Type 2 diabetes mellitus with other diabetic neurological complication: Secondary | ICD-10-CM | POA: Diagnosis not present

## 2013-03-06 DIAGNOSIS — E663 Overweight: Secondary | ICD-10-CM | POA: Diagnosis not present

## 2013-03-06 DIAGNOSIS — E1142 Type 2 diabetes mellitus with diabetic polyneuropathy: Secondary | ICD-10-CM | POA: Diagnosis not present

## 2013-03-06 DIAGNOSIS — E538 Deficiency of other specified B group vitamins: Secondary | ICD-10-CM | POA: Diagnosis not present

## 2013-03-11 IMAGING — XA IR INTRAVSC STENT CERV CAROTID W/ EMB-PROT
1 of 2 series · 9 of 24 positions shown · IV contrast (IODINE)
Comparison: MRI MRA of brain of 08/17/2006 and catheter angiogram
of 01/11/2003.

CLINICAL DATA: Patient with at least two episodes of right-sided
visual blurring lasting at least 20 minutes comprising atypical
amaurosis fugax.

[Series 300: neuro · 9 of 140 slices shown]
[im 1/140]
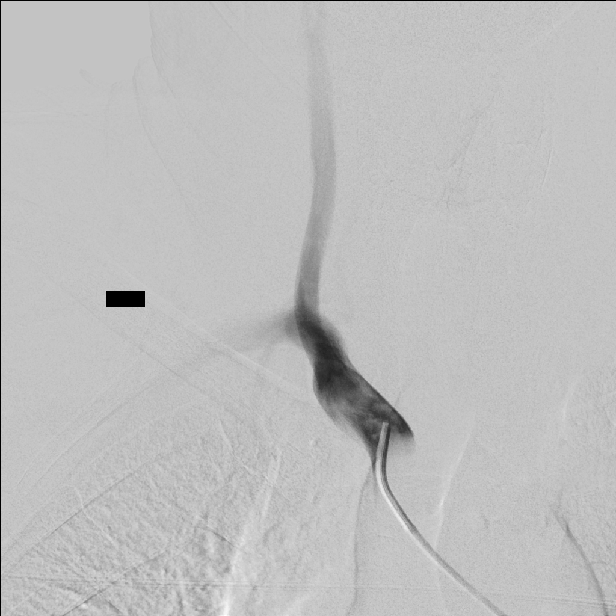
[im 19/140]
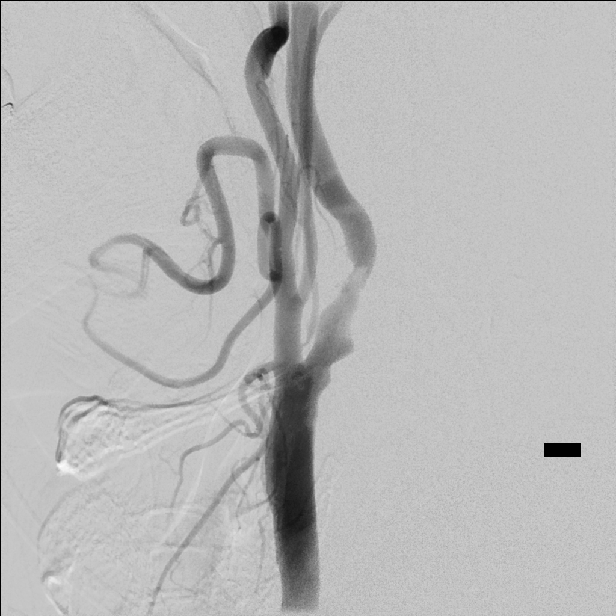
[im 32/140]
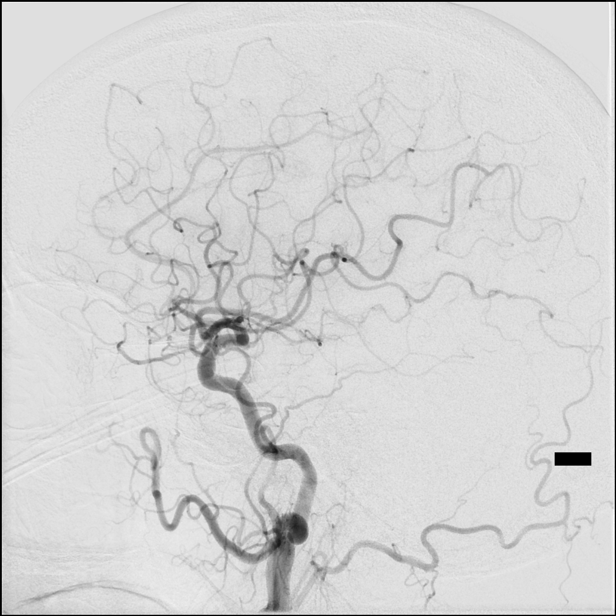
[im 51/140]
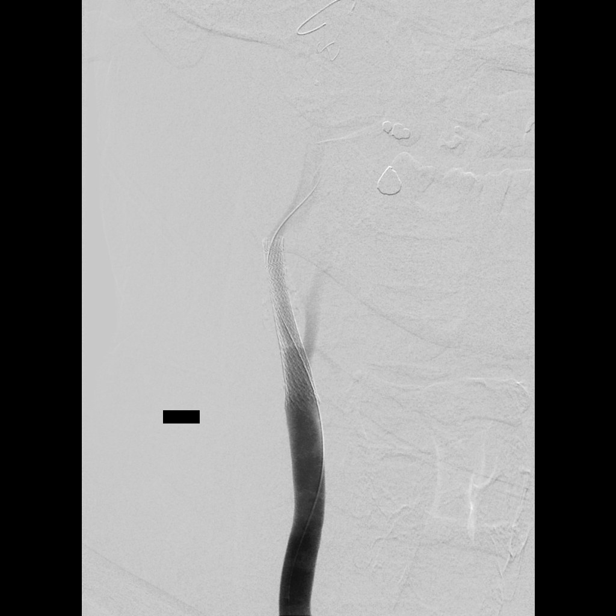
[im 70/140]
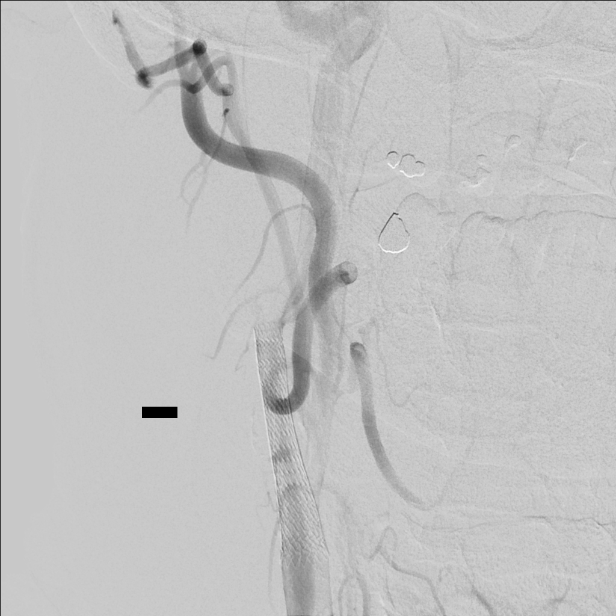
[im 83/140]
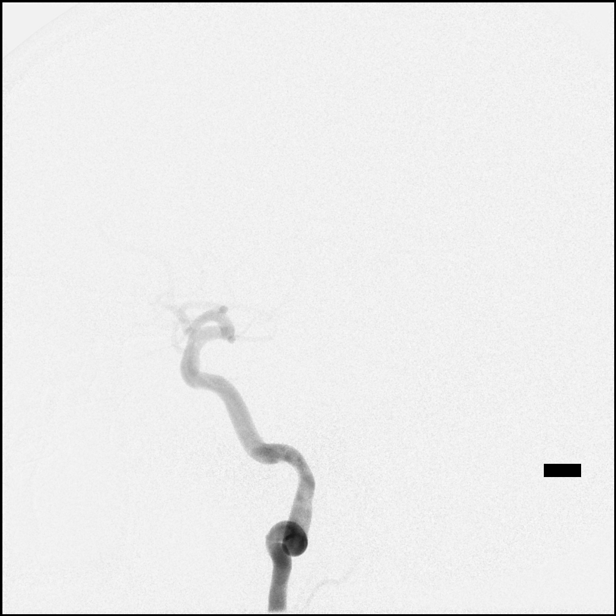
[im 102/140]
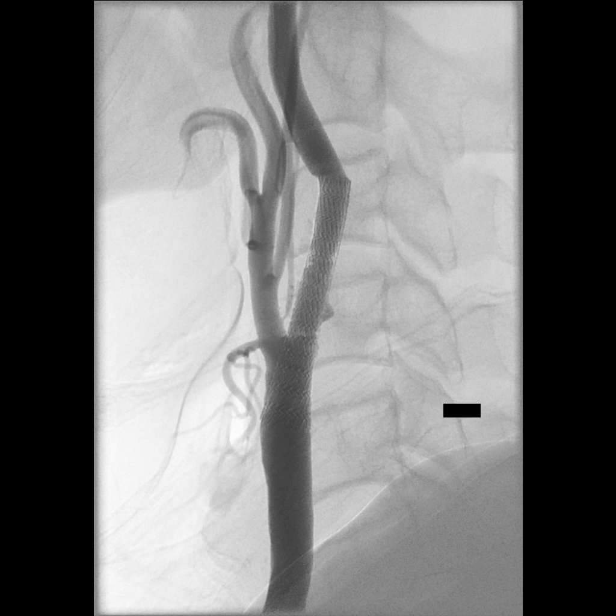
[im 121/140]
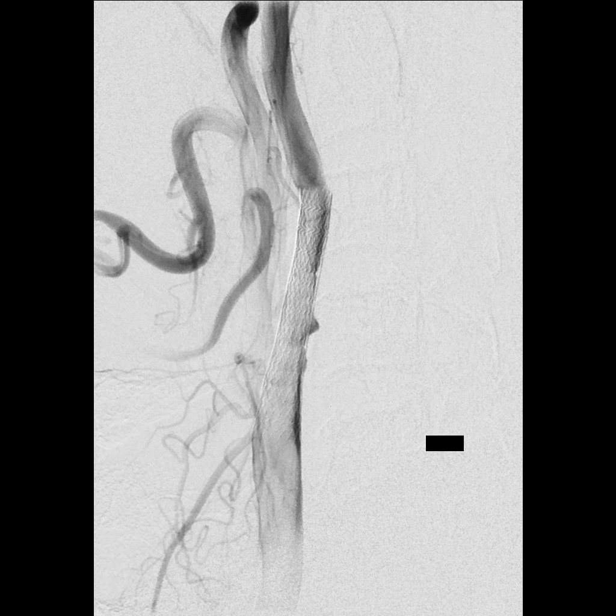
[im 133/140]
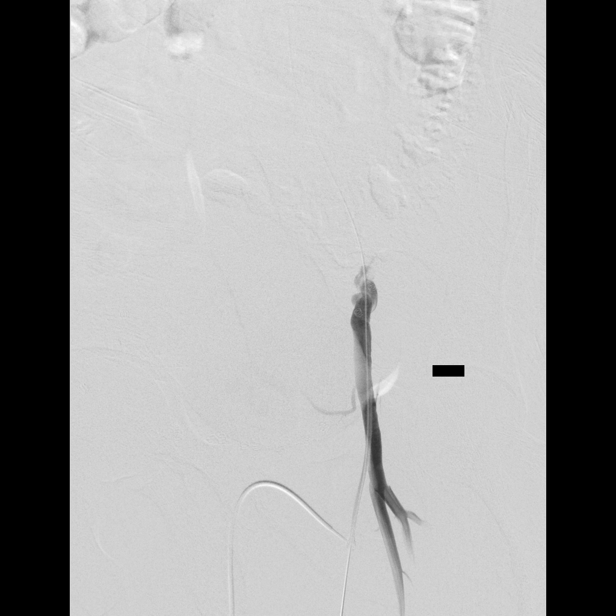

[9 of 24 positions shown; findings below may reference images not displayed]

Progressive ultrasound examination of the carotids revealing
progressive to severe critical stenosis of the right internal
carotid artery carotid bifurcation by most recent ultrasound. The
patient was referred by his cardiologist. The patient and the
spouse were given ample opportunity to also seek vascular surgery
consultation also which they refused.

RIGHT COMMON CAROTID ARTERIOGRAM FOLLOWED BY ENDOVASCULAR STENT-
ASSISTED ANGIOPLASTY OF SYMPTOMATIC SEVERE RIGHT INTERNAL CAROTID
ARTERY STENOSIS PROXIMALLY WITH DISTAL PROTECTION DEVICE
Following a full explanation of the procedure along with the
potential associated complications, an informed witnessed consent
was obtained.

The left groin was prepped and draped in the usual sterile fashion.
Thereafter using modified Seldinger technique, transfemoral access
into the left common femoral artery was obtained without
difficulty.  Over a 0.035-inch guidewire, a 5-French Pinnacle
sheath was inserted.  Through this and also over a 0.035-inch
guidewire, a 5-French JB1 catheter was advanced to the aortic arch
and selectively positioned in the right common carotid artery.

Arteriogram was then perfor[REDACTED]ed over the carotid
bifurcation and intracranially.

Medications:  MAC anesthesia per the [REDACTED]
at [HOSPITAL].
FINDINGS: The right common carotid arteriogram demonstrated the right
external carotid artery and its major branches to be normal.

The right internal carotid artery at the bulb and distal to the
bulb demonstrated extensive irregularity with a large shallow
atherosclerotic plaque along the lateral and the posterior aspects
associated with a focal ulceration. Also demonstrated was severe
focal stenosis in the proximal right internal carotid artery of
approximately 90% by the NASCET criteria.

Also seen was a well-defined filling defect at the site of the
severe stenosis which could have represented a focal clot and/or
hemorrhagic plaque.

Distal to this the right internal carotid artery was seen to
opacify normally to the cranial skull base with the exception of
moderate tortuosity in the distal one third.

The petrous, the cavernous and the supraclinoid segments are seen
to opacify normally.

The right middle and the right anterior cerebral arteries opacified
into capillary and venous phases.

Suggestion of unopacified blood probably from the contralateral
left ICA via the anterior communicating artery was seen in the A2
segment of the right anterior cerebral artery.
IMPRESSION: 1.  Severe approximately 90% stenosis of the right internal carotid
artery just distal to the bulb associated with a long segment
irregular atherosclerotic plaque.

STENT-ASSISTED ANGIOPLASTY OF THE 90% STENOSIS OF THE PROXIMAL
RIGHT INTERNAL CAROTID ARTERY, USING THE DISTAL EMBOLIC PROTECTION
DEVICE

The angiographic findings were reviewed with the patient and the
patient's spouse.

Given the findings and the patient's symptoms, it was decided to
proceed with endovascular revascularization.

The diagnostic JB1 catheter in the right common carotid artery was
exchanged over a 0.035-inch 300 cm Rosen exchange guidewire for a 6-
French 80 cm Anandajothi Lopang using biplane roadmap technique
and constant fluoroscopic guidance.

Good aspiration was obtained from the hub of the 6-Oumu Tandel
Danii Aujla.

A gentle contrast injection demonstrated no evidence of spasm
dissections or of intraluminal filling defects.

This was then connected to continuous heparinized saline infusion
via a Tuohy-Borst.  At this time a large 9 mm x 7 mm NAV Emboshield
protection device was prepped with heparinized saline infusion in
its housing.

After having cleared the air bubbles, the shield was captured into
its delivery device.

The entire system was then removed with a wire.  A J-tip
configuration was then given to the microguidewire tip.

At this time in a coaxial manner and with constant heparinized
saline infusion using biplane roadmap technique and constant
fluoroscopic guidance, the delivery microguidewire with the distal
protection device delivery microcatheter were advanced using rapid
exchange technique to the distal end of the Anandajothi Lopang.

The microguidewire was then gently manipulated using a torque
device using biplane roadmap technique and constant fluoroscopic
guidance and advanced without difficulty to the distal-most
vertical segment of the right internal carotid artery followed by
the distal protection device delivery microcatheter.

The restraint on the delivery microcatheter was then removed.

Holding the wire with the left hand, with the right hand the
delivery microcatheter was gently retrieved using biplane roadmap
technique and constant fluoroscopic guidance to unsheathe and
deploy the distal protection device.

This was performed with the distal protection device at the level
of C1.

A control arteriogram performed through the 6-Nya Jumper
Beyioku demonstrated no change in the extracranial circulation.

At this time a 4 mm x 20 mm Viatrac 014 angioplasty microcatheter
which had been prepped and purged with heparinized saline was
advanced in a coaxial manner and with constant heparinized saline
infusion using biplane roadmap technique and constant fluoroscopic
guidance using the Rapid Exchange technique and positioned
optimally at the site of the tight focal stenosis.

This balloon was then gradually expanded to just over 4 mm via a
microinflation syringe device connected to microtubing.

Once the balloon was just over 4 mm in diameter, it was left
deployed for approximately 10 seconds to 15 seconds.

This was then rapidly deflated using the inflation device.

The balloon was then retrieved and removed using biplane
fluoroscopy to ensure safe distal positioning of the distal
protection device.  A control arteriogram performed through the 6-
Rolf Adler Lampe demonstrated significantly improved
caliber and flow through the angioplasty segment.

At this time an 8 mm x 6 mm x 40 mm tapered Xact stent which had
been prepped and purged with heparinized saline infusion in its
housing was advanced in a coaxial manner and with constant
heparinized saline infusion using biplane roadmap technique and
constant fluoroscopic guidance using the Rapid Exchange system and
positioned with the distal and proximal markers optimally.

This was then deployed in the usual fashion with stable apposition
distally and proximally across the angioplasty segment.

The stent delivery microcatheter was then retrieved without
difficulty.  A control arteriogram performed through the 6-Obdulio
Anandajothi Lopang demonstrated excellent flow through the stented
segment intracranially.

A 5 mm x 20 mm Viatrac 0.014-inch system balloon angioplasty
microcatheter was then advanced after it had been flushed with
heparinized saline infusion.  In a coaxial manner and with constant
heparinized saline infusion using the Rapid Exchange system, this
was then advanced and positioned optimally across the waist within
the stent.  This balloon was then again expanded gradually using a
microinflation syringe device via microtubing to just over 5 mm.
This was then maintained for approximately 15 to 20 seconds.

The balloon was deflated and removed, maintaining stability of the
distal protection device.

A control arteriogram performed through the 6-Nya Jumper
Beyioku demonstrated excellent caliber and flow through the stented
segment at the previous waist.

An Emboshield retrieval device was then advanced in a coaxial
manner and with constant heparinized saline infusion across the
stented segment of the right internal carotid artery to the
proximal marker on the distal protection device.

The microguidewire filter was then retrieved into the capture
device.  Once completely collapsed, the combination was removed
under fluoroscopic guidance ensuring no entanglement with the stent
struts.  None was observed.

Control arteriograms were then performed at [DATE] and 40 minutes
post stent placement.

These continued to demonstrate excellent flow.  The previously
noted ulcers were seen to fill in a slow manner.  However no
intrastent filling defects were seen.

No filling defects were seen intracranially.

The 6-Rolf Adler Lampe was then retrieved into the
abdominal aorta and exchanged over a J-tip guidewire for a 6-French
Pinnacle sheath.  This was then replaced with an arterial closure
device.

Throughout the procedure the patient's neurological and hemodynamic
status remained stable.

The patient's ACT was maintained in the region of approximately 180-
200 seconds. The patient was then transported to the Neuro ICU for
overnight close neurological observations, to continue his IV
heparin and to keep close monitoring of the patient's blood
pressure to prevent re-perfusion syndrome/hemorrhage.

IMPRESSION
1.  Status post endovascular stent-assisted angioplasty with distal
protection device for revascularization of symptomatic severe 90%
stenosis of the right internal carotid artery just distal to the
bulb.

The patient's overnight stay was unremarkable.  His IV heparin was
stopped the following morning.  He was started on aspirin 325 mg
per day and Plavix 75 mg a day.  He was ambulatory independently,
and eating prior to his discharge under the care of his son.
Special instructions were given to the patient. A follow-up in the
clinic has been arranged for two weeks' time.

## 2013-03-13 ENCOUNTER — Other Ambulatory Visit: Payer: Self-pay | Admitting: Internal Medicine

## 2013-03-22 ENCOUNTER — Other Ambulatory Visit: Payer: Self-pay | Admitting: Radiology

## 2013-03-22 DIAGNOSIS — I6529 Occlusion and stenosis of unspecified carotid artery: Secondary | ICD-10-CM

## 2013-03-23 ENCOUNTER — Telehealth (HOSPITAL_COMMUNITY): Payer: Self-pay | Admitting: Interventional Radiology

## 2013-03-23 NOTE — Telephone Encounter (Signed)
Called pt left VM for him to call and schedule 6 month f/u US JM

## 2013-04-03 ENCOUNTER — Ambulatory Visit (HOSPITAL_COMMUNITY)
Admission: RE | Admit: 2013-04-03 | Discharge: 2013-04-03 | Disposition: A | Discharge status: Home / Self Care | Payer: Medicare Other | Source: Ambulatory Visit | Attending: Radiology | Admitting: Radiology

## 2013-04-03 DIAGNOSIS — I6340 Cerebral infarction due to embolism of unspecified cerebral artery: Secondary | ICD-10-CM

## 2013-04-03 DIAGNOSIS — I6529 Occlusion and stenosis of unspecified carotid artery: Secondary | ICD-10-CM | POA: Diagnosis not present

## 2013-04-03 NOTE — Progress Notes (Signed)
Bilateral carotid artery duplex:  1-39% ICA stenosis.  Vertebral artery flow is antegrade.     

## 2013-04-04 ENCOUNTER — Telehealth (HOSPITAL_COMMUNITY): Payer: Self-pay | Admitting: Interventional Radiology

## 2013-04-04 NOTE — Telephone Encounter (Signed)
Called pt, spoke to his wife, told her that we would f/u with another US carotid doppler study in 6 months time. Also told her that his recent study was fine. She states understanding and is in agreement with this plan of care. JM

## 2013-04-05 ENCOUNTER — Ambulatory Visit (INDEPENDENT_AMBULATORY_CARE_PROVIDER_SITE_OTHER): Payer: Medicare Other | Admitting: Pulmonary Disease

## 2013-04-05 ENCOUNTER — Encounter: Payer: Self-pay | Admitting: Pulmonary Disease

## 2013-04-05 VITALS — BP 132/90 | HR 84 | Ht 70.5 in | Wt 191.0 lb

## 2013-04-05 DIAGNOSIS — G4733 Obstructive sleep apnea (adult) (pediatric): Secondary | ICD-10-CM

## 2013-04-05 NOTE — Progress Notes (Signed)
Chief Complaint  Patient presents with  . Sleep Apnea    History of Present Illness: Andrew Malone. is a 73 y.o. male with OSA.  He has been doing well with CPAP.  He has nasal pillows mask.  This fits well.  He is sleeping well, and feels rested.  TESTS: PSG 09/14/10>>AHI 8, SpO2 low 84% Auto CPAP 02/18/12 to 05/17/12 >> Used on 60 of 90 nights with average 6 hrs 32 min.  Average AHI 0.6 with median CPAP 6 cm H2O and 95th percentile CPAP 8 cm H2O.  He  has a past medical history of CAD (12/30/2009); CAROTID ARTERY DISEASE (11/26/2009); GERD (12/11/2006); GLOMERULONEPHRITIS (12/11/2006); HYPERLIPIDEMIA (12/27/2009); Cancer; Heart murmur; HYPERTENSION (12/11/2006); H/O hiatal hernia; History of stomach ulcers (1994); Complication of anesthesia; H/O seasonal allergies; OSA (obstructive sleep apnea) (09/26/2010); CVA (12/11/2006); Stroke (08/05/11); Arthritis (08/05/11); Chronic high back pain (08/05/11); Anxiety (08/05/11); B12 deficiency (06/27/2012); and Aortic stenosis.  He  has past surgical history that includes Cardiac catheterization (2007); Coronary angioplasty with stent (07/2011); Inguinal hernia repair (~ 2002); Hernia repair (~ 2002); and TEE without cardioversion (08/11/2011).   Outpatient Encounter Prescriptions as of 04/05/2013  Medication Sig  . acyclovir (ZOVIRAX) 200 MG capsule TAKE ONE CAPSULE THREE TIMES A DAY AS NEEDED  . ammonium lactate (LAC-HYDRIN) 12 % lotion Apply 1 application topically 2 (two) times daily.   Marland Kitchen aspirin EC 81 MG tablet Take 81 mg by mouth daily.   Marland Kitchen atorvastatin (LIPITOR) 20 MG tablet take 1 tablet by mouth once daily  . citalopram (CELEXA) 10 MG tablet Take 1 tablet (10 mg total) by mouth daily.  . clonazePAM (KLONOPIN) 0.5 MG tablet TAKE 1 TABLET EVERY 8 HOURS AS NEEDED  . clopidogrel (PLAVIX) 75 MG tablet Take 1 tablet (75 mg total) by mouth daily.  Marland Kitchen glimepiride (AMARYL) 1 MG tablet Take 1 tablet (1 mg total) by mouth daily before breakfast.  .  hydrochlorothiazide (HYDRODIURIL) 25 MG tablet take 1 tablet by mouth once daily  . HYDROcodone-homatropine (HYCODAN) 5-1.5 MG/5ML syrup Take 5 mLs by mouth every 6 (six) hours as needed for cough.  . ONE TOUCH ULTRA TEST test strip USE 1 STRIP TO CHECK BLOOD SUGAR DAILY  . ONETOUCH DELICA LANCETS 16X MISC USE ONCE DAILY  . pantoprazole (PROTONIX) 40 MG tablet Take 1 tablet (40 mg total) by mouth daily.  Marland Kitchen PREVIDENT 5000 BOOSTER PLUS 1.1 % PSTE   . Tamsulosin HCl (FLOMAX) 0.4 MG CAPS Take 1 capsule by mouth at bedtime.  . [DISCONTINUED] acyclovir (ZOVIRAX) 200 MG capsule Take 200 mg by mouth 3 (three) times daily as needed. For herpes outbreaks  . [DISCONTINUED] predniSONE (DELTASONE) 10 MG tablet 3 tabs by mouth per day for 3 days,2tabs per day for 3 days,1tab per day for 3 days  . atorvastatin (LIPITOR) 20 MG tablet Take 20 mg by mouth daily.    No Known Allergies  Physical Exam:  General - No distress ENT - No sinus tenderness, no oral exudate, no LAN Cardiac - s1s2 regular, no murmur Chest - No wheeze/rales/dullness Back - No focal tenderness Abd - Soft, non-tender Ext - No edema Neuro - Normal strength Skin - No rashes Psych - normal mood, and behavior   Assessment/Plan:  Chesley Mires, MD [facility] Pager:  [phone] 04/05/2013, 4:15 PM

## 2013-04-05 NOTE — Patient Instructions (Signed)
Follow up in 1 year.

## 2013-04-05 NOTE — Assessment & Plan Note (Signed)
He is compliant with CPAP and reports benefit.  Will continue auto CPAP.

## 2013-04-10 ENCOUNTER — Telehealth: Payer: Self-pay | Admitting: Pulmonary Disease

## 2013-04-10 DIAGNOSIS — H2510 Age-related nuclear cataract, unspecified eye: Secondary | ICD-10-CM | POA: Diagnosis not present

## 2013-04-10 DIAGNOSIS — G4733 Obstructive sleep apnea (adult) (pediatric): Secondary | ICD-10-CM

## 2013-04-10 DIAGNOSIS — H26019 Infantile and juvenile cortical, lamellar, or zonular cataract, unspecified eye: Secondary | ICD-10-CM | POA: Diagnosis not present

## 2013-04-10 DIAGNOSIS — E119 Type 2 diabetes mellitus without complications: Secondary | ICD-10-CM | POA: Diagnosis not present

## 2013-04-10 NOTE — Telephone Encounter (Signed)
Spoke with pt wife and she stats pt needs order sent to [facility] for cpap supplies and mask. Order placed. Detmold Bing, CMA

## 2013-04-15 IMAGING — XA IR ANGIO INTRA EXTRACRAN SEL COM CAROTID INNOMINATE BILAT MOD SE
1 series · 13 of 24 positions shown · IV contrast (IODINE)
Comparison: MRI MRA of the brain of 08/05/2011 and catheter
angiogram of 06/25/2011.

CLINICAL DATA: 3-day history of intermittent generalized weakness
and dizziness.  Abnormal MRI of the brain. Previous right internal
carotid artery stenting.

BILATERAL COMMON CAROTID AND BILATERAL VERTEBRAL ARTERY
ARTERIOGRAMS

[Series 300: ir angio external carotid sel ext caroti · 13 of 139 slices shown]
[im 1/139]
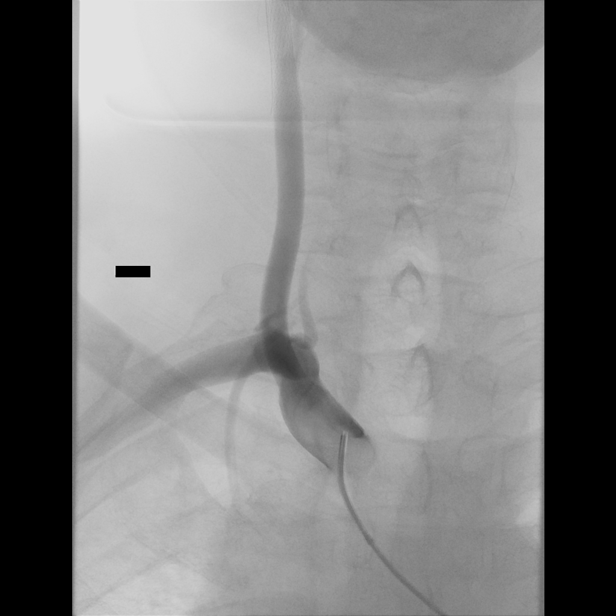
[im 13/139]
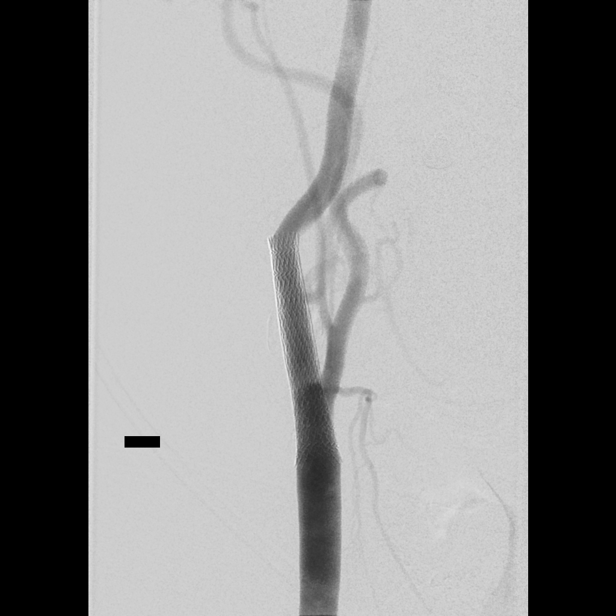
[im 25/139]
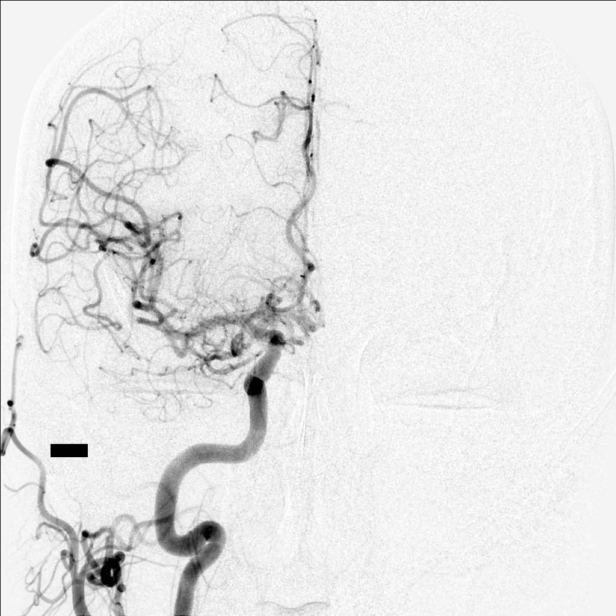
[im 37/139]
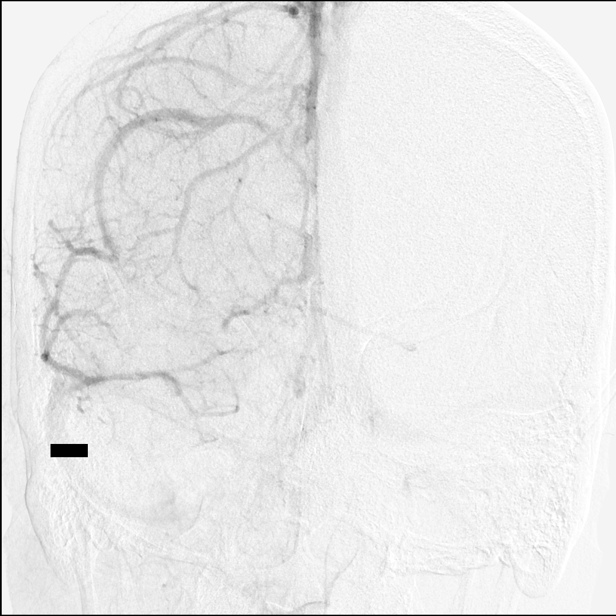
[im 49/139]
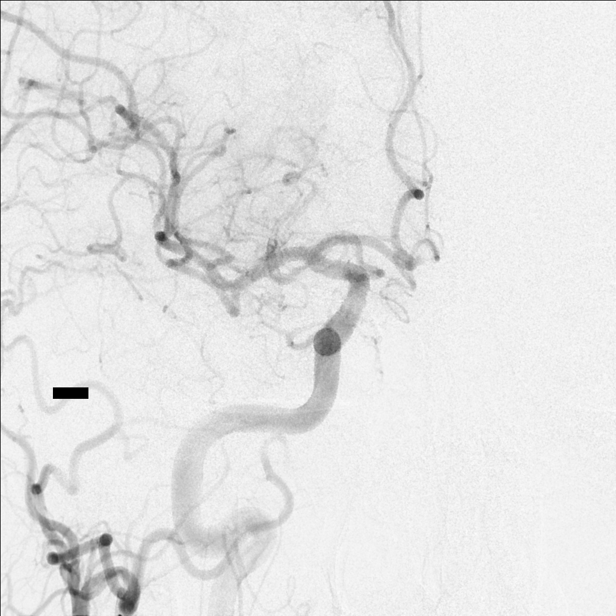
[im 61/139]
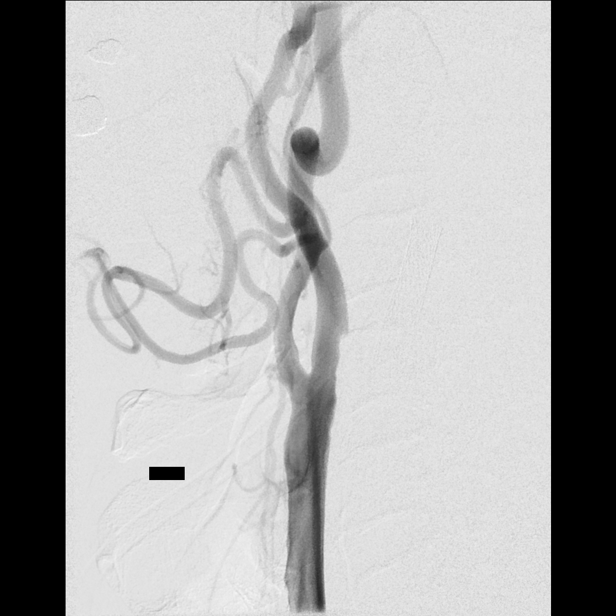
[im 73/139]
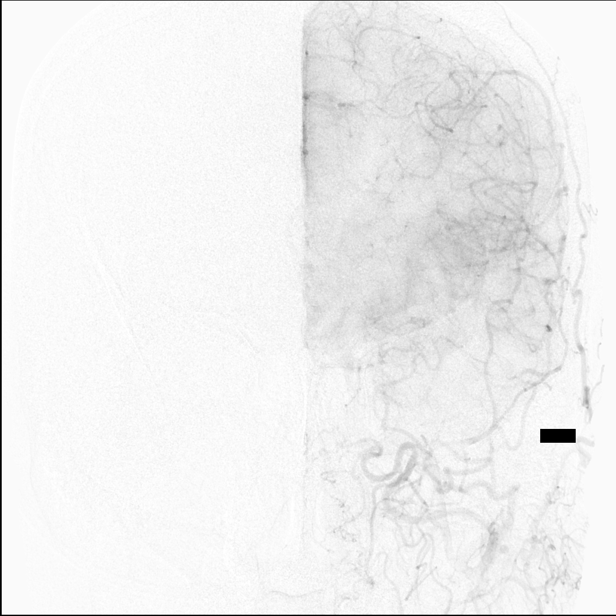
[im 79/139]
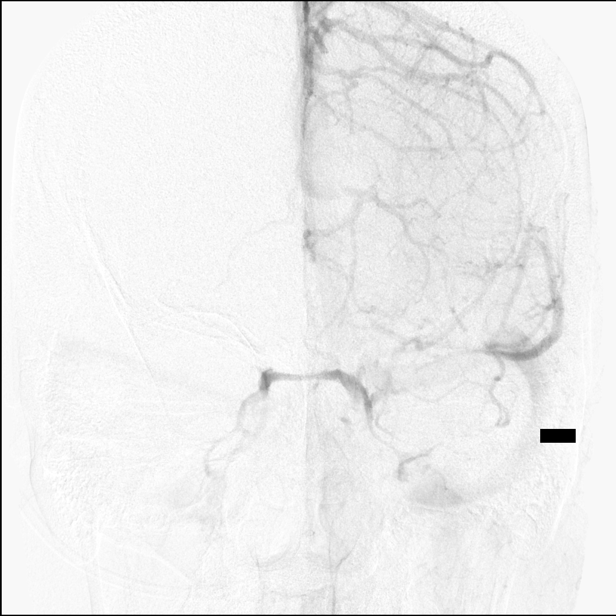
[im 91/139]
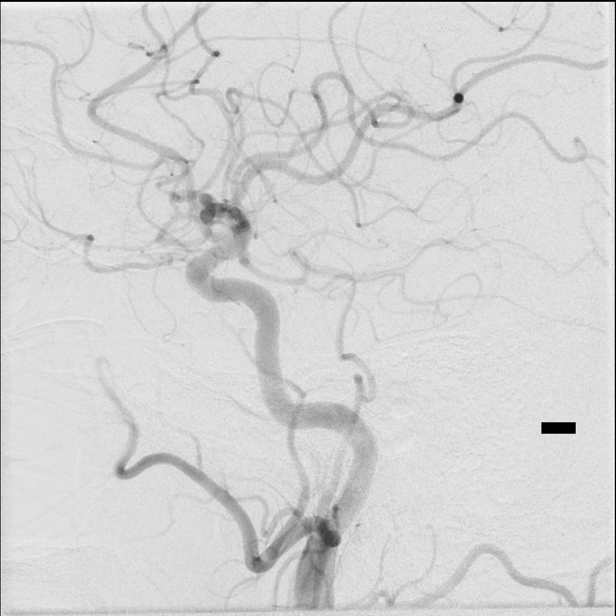
[im 103/139]
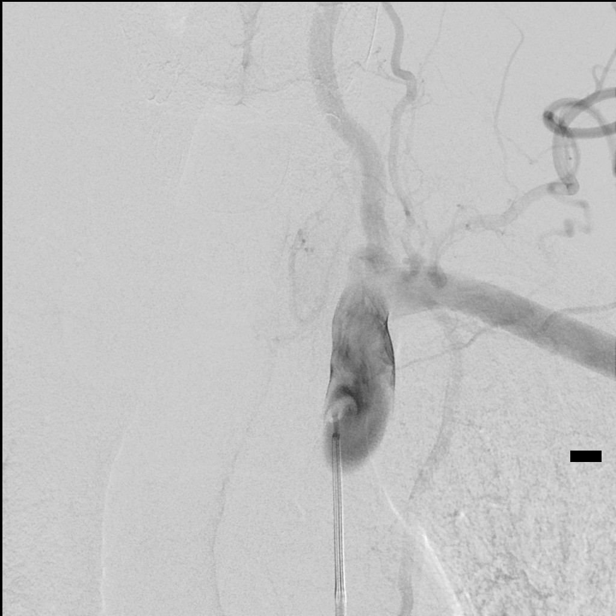
[im 115/139]
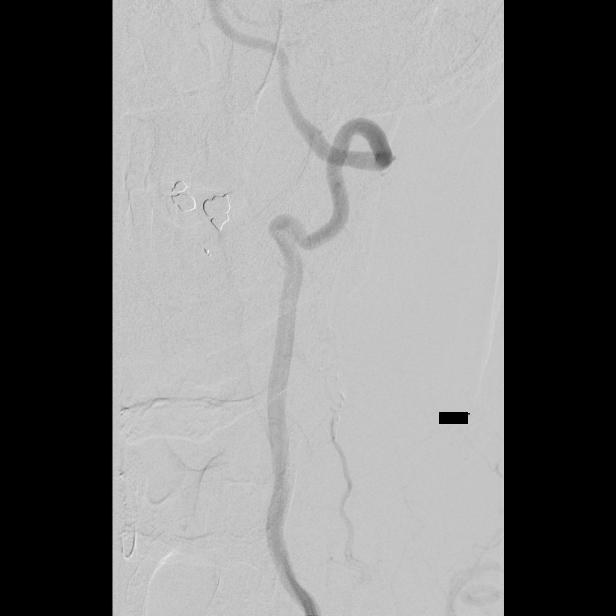
[im 127/139]
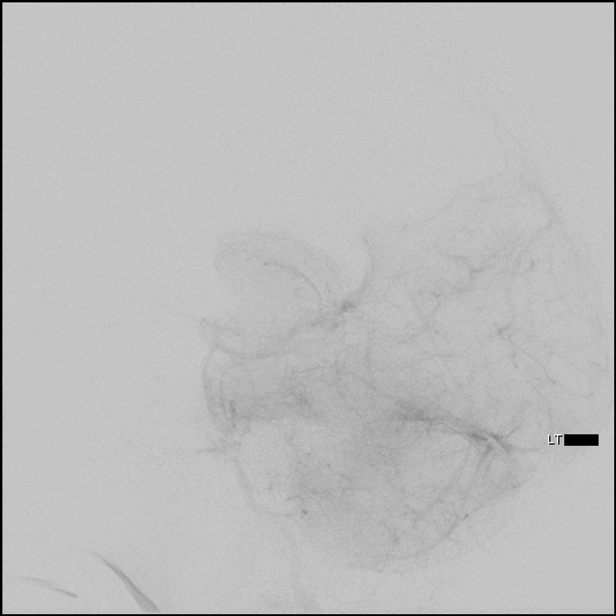
[im 139/139]
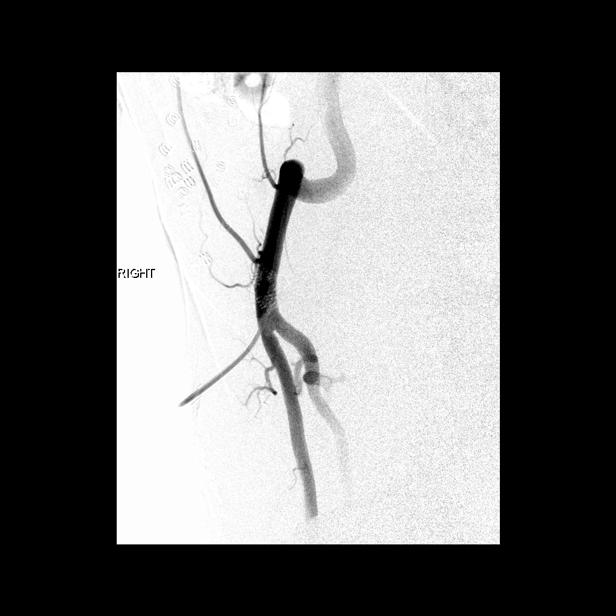

[13 of 24 positions shown; findings below may reference images not displayed]

Following a full explanation of the procedure along with the
potential associated complications, an informed witnessed consent
was obtained.

The right groin was prepped and draped in the usual sterile
fashion. Thereafter using modified Seldinger technique,
transfemoral access into the right common femoral artery was
obtained without difficulty.  Over a 0.035-inch guidewire, a 5-
French Pinnacle sheath was inserted.  Through this and also over a
0.035-inch guidewire, a 5-French JB1 catheter was advanced to the
aortic arch region and selectively positioned in the innominate
artery, the right common carotid artery, the left common carotid
artery and the left vertebral artery.

There were no acute complications.  The patient tolerated the
procedure well.

Medications utilized: Versed 1 mg IV.  Fentanyl 75 mcg IV.

Contrast:  Rmnipaque-POO approximately 50 ml.
FINDINGS: The right common carotid arteriogram demonstrates the right
external carotid artery and its major branches to be normal.

The right internal carotid artery at the bulb to the cranial skull
base opacifies normally.

The previously stented segment of the proximal right internal
carotid artery demonstrates no evidence of intrastent stenosis or
stent strut irregularities.

The petrous, cavernous and the supraclinoid segments are normal.

The right posterior communicating artery is seen opacifying the
right posterior cerebral and cerebellar artery distributions.

The right middle cerebral artery and the right anterior cerebral
artery opacify normally into the capillary and venous phases.

Minimal focal narrowing is seen of the tertiary branches of the A3
segment of the right anterior cerebral artery.

The right vertebral artery origin is normal.  The vessel opacifies
normally to the cranial skull base.

The left common carotid arteriogram demonstrates the left external
carotid artery, and its major branches to be normal.

The left internal carotid artery at the bulb along its posterior
wall has a shallow smooth plaque without stenosis by NASCET
criteria.

The vessel otherwise opacifies normally to the cranial skull base.

The petrous, cavernous and the supraclinoid segments are normal.

A left posterior communicating artery is seen opacifying the left
posterior cerebral artery distribution.

The left middle and the left anterior cerebral arteries opacify
normally into capillary and venous phases.

Minimal fine narrowing is seen of the A3 segment of the left
anterior cerebral artery on this side also.

The left vertebral artery origin is normal.  The vessel
demonstrates moderate tortuosity proximally.  Normal opacification
is seen of this vessel to the cranial skull base.

There is normal opacification of the left posterior inferior
cerebellar artery and the left vertebrobasilar junction.

The basilar artery, the posterior cerebral arteries, the superior
cerebellar arteries and the anterior-inferior cerebellar arteries
opacify normally into capillary and venous phases.

Inflow from the right posterior communicating artery is seen into
the right posterior cerebral artery.

IMPRESSION
1.  Wide patency of the stented portion of the right internal
carotid artery proximally.
2. Mild atherosclerotic disease of the left internal carotid artery
at the bulb.
3. Minimal nonspecific narrowing of the A3 branches of the anterior
cerebral arteries. This could represent mild intracranial
arteriosclerosis.

## 2013-04-17 ENCOUNTER — Ambulatory Visit: Payer: Medicare Other | Admitting: Nurse Practitioner

## 2013-04-20 ENCOUNTER — Ambulatory Visit (INDEPENDENT_AMBULATORY_CARE_PROVIDER_SITE_OTHER): Payer: Medicare Other

## 2013-04-20 DIAGNOSIS — Z23 Encounter for immunization: Secondary | ICD-10-CM | POA: Diagnosis not present

## 2013-04-21 ENCOUNTER — Other Ambulatory Visit: Payer: Self-pay | Admitting: Internal Medicine

## 2013-04-27 ENCOUNTER — Ambulatory Visit: Payer: Medicare Other | Admitting: Nurse Practitioner

## 2013-04-28 ENCOUNTER — Telehealth: Payer: Self-pay | Admitting: Nurse Practitioner

## 2013-05-01 ENCOUNTER — Encounter: Payer: Self-pay | Admitting: Nurse Practitioner

## 2013-05-17 DIAGNOSIS — H26019 Infantile and juvenile cortical, lamellar, or zonular cataract, unspecified eye: Secondary | ICD-10-CM | POA: Diagnosis not present

## 2013-05-17 DIAGNOSIS — H2510 Age-related nuclear cataract, unspecified eye: Secondary | ICD-10-CM | POA: Diagnosis not present

## 2013-05-18 ENCOUNTER — Ambulatory Visit: Payer: Medicare Other | Admitting: Nurse Practitioner

## 2013-05-24 DIAGNOSIS — H26019 Infantile and juvenile cortical, lamellar, or zonular cataract, unspecified eye: Secondary | ICD-10-CM | POA: Diagnosis not present

## 2013-05-24 DIAGNOSIS — H2510 Age-related nuclear cataract, unspecified eye: Secondary | ICD-10-CM | POA: Diagnosis not present

## 2013-06-05 DIAGNOSIS — IMO0001 Reserved for inherently not codable concepts without codable children: Secondary | ICD-10-CM | POA: Diagnosis not present

## 2013-06-05 DIAGNOSIS — E1142 Type 2 diabetes mellitus with diabetic polyneuropathy: Secondary | ICD-10-CM | POA: Diagnosis not present

## 2013-06-05 DIAGNOSIS — E538 Deficiency of other specified B group vitamins: Secondary | ICD-10-CM | POA: Diagnosis not present

## 2013-06-05 DIAGNOSIS — Z79899 Other long term (current) drug therapy: Secondary | ICD-10-CM | POA: Diagnosis not present

## 2013-06-05 DIAGNOSIS — E1149 Type 2 diabetes mellitus with other diabetic neurological complication: Secondary | ICD-10-CM | POA: Diagnosis not present

## 2013-06-05 DIAGNOSIS — R5383 Other fatigue: Secondary | ICD-10-CM | POA: Diagnosis not present

## 2013-06-05 DIAGNOSIS — R5381 Other malaise: Secondary | ICD-10-CM | POA: Diagnosis not present

## 2013-06-12 DIAGNOSIS — Z79899 Other long term (current) drug therapy: Secondary | ICD-10-CM | POA: Diagnosis not present

## 2013-06-12 DIAGNOSIS — IMO0001 Reserved for inherently not codable concepts without codable children: Secondary | ICD-10-CM | POA: Diagnosis not present

## 2013-06-12 DIAGNOSIS — R5381 Other malaise: Secondary | ICD-10-CM | POA: Diagnosis not present

## 2013-06-12 DIAGNOSIS — E538 Deficiency of other specified B group vitamins: Secondary | ICD-10-CM | POA: Diagnosis not present

## 2013-06-12 DIAGNOSIS — R5383 Other fatigue: Secondary | ICD-10-CM | POA: Diagnosis not present

## 2013-06-12 DIAGNOSIS — E1149 Type 2 diabetes mellitus with other diabetic neurological complication: Secondary | ICD-10-CM | POA: Diagnosis not present

## 2013-06-20 ENCOUNTER — Other Ambulatory Visit (HOSPITAL_COMMUNITY): Payer: Self-pay | Admitting: Interventional Radiology

## 2013-06-20 DIAGNOSIS — I771 Stricture of artery: Secondary | ICD-10-CM

## 2013-06-21 ENCOUNTER — Other Ambulatory Visit: Payer: Self-pay | Admitting: Internal Medicine

## 2013-07-03 ENCOUNTER — Ambulatory Visit (HOSPITAL_COMMUNITY): Admission: RE | Admit: 2013-07-03 | Payer: Medicare Other | Source: Ambulatory Visit

## 2013-07-13 ENCOUNTER — Ambulatory Visit (HOSPITAL_COMMUNITY): Admission: RE | Admit: 2013-07-13 | Payer: Medicare Other | Source: Ambulatory Visit

## 2013-07-17 ENCOUNTER — Ambulatory Visit (INDEPENDENT_AMBULATORY_CARE_PROVIDER_SITE_OTHER): Payer: Medicare Other | Admitting: Internal Medicine

## 2013-07-17 ENCOUNTER — Encounter: Payer: Self-pay | Admitting: Internal Medicine

## 2013-07-17 VITALS — BP 130/70 | HR 80 | Temp 97.5°F | Resp 16 | Wt 195.0 lb

## 2013-07-17 DIAGNOSIS — F3289 Other specified depressive episodes: Secondary | ICD-10-CM

## 2013-07-17 DIAGNOSIS — E785 Hyperlipidemia, unspecified: Secondary | ICD-10-CM | POA: Diagnosis not present

## 2013-07-17 DIAGNOSIS — I6340 Cerebral infarction due to embolism of unspecified cerebral artery: Secondary | ICD-10-CM | POA: Diagnosis not present

## 2013-07-17 DIAGNOSIS — I6350 Cerebral infarction due to unspecified occlusion or stenosis of unspecified cerebral artery: Secondary | ICD-10-CM | POA: Diagnosis not present

## 2013-07-17 DIAGNOSIS — F329 Major depressive disorder, single episode, unspecified: Secondary | ICD-10-CM

## 2013-07-17 DIAGNOSIS — I10 Essential (primary) hypertension: Secondary | ICD-10-CM

## 2013-07-17 DIAGNOSIS — E538 Deficiency of other specified B group vitamins: Secondary | ICD-10-CM | POA: Diagnosis not present

## 2013-07-17 DIAGNOSIS — R079 Chest pain, unspecified: Secondary | ICD-10-CM | POA: Diagnosis not present

## 2013-07-17 DIAGNOSIS — E119 Type 2 diabetes mellitus without complications: Secondary | ICD-10-CM

## 2013-07-17 MED ORDER — VITAMIN B-12 1000 MCG SL SUBL: 1.0000 | SUBLINGUAL_TABLET | Freq: Every day | SUBLINGUAL | Status: AC

## 2013-07-17 MED ORDER — VITAMIN D 1000 UNITS PO TABS: 1000.0000 [IU] | ORAL_TABLET | Freq: Every day | ORAL | Status: AC

## 2013-07-17 NOTE — Progress Notes (Signed)
Subjective:    [name]: Andrew Malone., male    DOB: 1940/04/20, 73 y.o.   MRN: [id number]  HPI  New pt -- Dr MEN  Subjective:   [name]: Andrew Malone., male DOB: 12-22-1940, 72 y.o. MRN: [id number]  HPI  Andrew Malone for follow up of diabetes. Chart reviewed - he has a steadily increasing A1C, last being 7.4% at which point Dr. Jenny Reichmann started him on Amaryl. Patient has been doing well but does admit to have night sweats that will awaken him about 0200 hrs. He has an early supper and does not have a bedtime snack. He has been working on a no sugar low carb diet.  PMH, FamHx and SocHx reviewed for any changes and relevance.  Current Outpatient Prescriptions on File Prior to Visit   Medication  Sig  Dispense  Refill   .  acyclovir (ZOVIRAX) 200 MG capsule  Take 200 mg by mouth 3 (three) times daily as needed. For herpes outbreaks     .  acyclovir (ZOVIRAX) 200 MG capsule  TAKE ONE CAPSULE THREE TIMES A DAY AS NEEDED  90 capsule  3   .  ammonium lactate (LAC-HYDRIN) 12 % lotion  Apply 1 application topically 2 (two) times daily.     Marland Kitchen  aspirin EC 81 MG tablet  Take 81 mg by mouth daily.     Marland Kitchen  atorvastatin (LIPITOR) 20 MG tablet  take 1 tablet by mouth once daily  30 tablet  6   .  citalopram (CELEXA) 10 MG tablet  Take 1 tablet (10 mg total) by mouth daily.  90 tablet  3   .  clonazePAM (KLONOPIN) 0.5 MG tablet  TAKE 1 TABLET EVERY 8 HOURS AS NEEDED  90 tablet  5   .  clopidogrel (PLAVIX) 75 MG tablet  take 1 tablet by mouth once daily  30 tablet  5   .  glimepiride (AMARYL) 1 MG tablet  Take 1 tablet (1 mg total) by mouth daily before breakfast.  90 tablet  3   .  glucose blood (ONE TOUCH TEST STRIPS) test strip  Use 1 strip to check blood sugar daily. Dx 250.00  50 each  3   .  hydrochlorothiazide (HYDRODIURIL) 25 MG tablet  take 1 tablet by mouth once daily  90 tablet  3   .  HYDROcodone-homatropine (HYCODAN) 5-1.5 MG/5ML syrup  Take 5 mLs by mouth every 6 (six)  hours as needed for cough.  120 mL  1   .  ONE TOUCH LANCETS MISC  Use daily to get blood sugar. Dx 250.00  30 each  3   .  pantoprazole (PROTONIX) 40 MG tablet  take 1 tablet by mouth once daily  30 tablet  5   .  predniSONE (DELTASONE) 10 MG tablet  3 tabs by mouth per day for 3 days,2tabs per day for 3 days,1tab per day for 3 days  18 tablet  0   .  PREVIDENT 5000 BOOSTER PLUS 1.1 % PSTE      .  Tamsulosin HCl (FLOMAX) 0.4 MG CAPS  Take 1 capsule by mouth at bedtime.     Marland Kitchen  atorvastatin (LIPITOR) 20 MG tablet  Take 20 mg by mouth daily.      BP Readings from Last 3 Encounters:  07/17/13 130/70  04/05/13 132/90  11/21/12 140/72   Wt Readings from Last 3 Encounters:  07/17/13 195 lb (88.451 kg)  04/05/13  191 lb (86.637 kg)  11/21/12 194 lb 6.4 oz (88.179 kg)       Review of Systems  Constitutional: Negative for appetite change, fatigue and unexpected weight change.  HENT: Negative for congestion, nosebleeds, sneezing, sore throat and trouble swallowing.   Eyes: Negative for itching and visual disturbance.  Respiratory: Negative for cough.   Cardiovascular: Negative for chest pain, palpitations and leg swelling.  Gastrointestinal: Negative for nausea, diarrhea, blood in stool and abdominal distention.  Genitourinary: Negative for frequency and hematuria.  Musculoskeletal: Positive for arthralgias. Negative for back pain, gait problem, joint swelling and neck pain.  Skin: Negative for rash.  Neurological: Negative for dizziness, tremors, speech difficulty and weakness.  Psychiatric/Behavioral: Negative for sleep disturbance, dysphoric mood and agitation. The patient is not nervous/anxious.        Objective:   Physical Exam  Constitutional: He is oriented to person, place, and time. He appears well-developed. No distress.  NAD  HENT:  Mouth/Throat: Oropharynx is clear and moist.  Eyes: Conjunctivae are normal. Pupils are equal, round, and reactive to light.  Neck: Normal  range of motion. No JVD present. No thyromegaly present.  Cardiovascular: Normal rate, regular rhythm, normal heart sounds and intact distal pulses.  Exam reveals no gallop and no friction rub.   No murmur heard. Pulmonary/Chest: Effort normal and breath sounds normal. No respiratory distress. He has no wheezes. He has no rales. He exhibits no tenderness.  Abdominal: Soft. Bowel sounds are normal. He exhibits no distension and no mass. There is no tenderness. There is no rebound and no guarding.  Musculoskeletal: Normal range of motion. He exhibits no edema and no tenderness.  Lymphadenopathy:    He has no cervical adenopathy.  Neurological: He is alert and oriented to person, place, and time. He has normal reflexes. No cranial nerve deficit. He exhibits normal muscle tone. He displays a negative Romberg sign. Coordination and gait normal.  No meningeal signs  Skin: Skin is warm and dry. No rash noted.  Psychiatric: He has a normal mood and affect. His behavior is normal. Judgment and thought content normal.    Lab Results  Component Value Date   WBC 6.8 08/05/2011   HGB 13.2 08/05/2011   HCT 38.1* 08/05/2011   PLT 195 08/05/2011   GLUCOSE 151* 10/24/2012   CHOL 141 06/27/2012   TRIG 230.0* 06/27/2012   HDL 31.70* 06/27/2012   LDLDIRECT 89.5 06/27/2012   LDLCALC 77 03/17/2012   ALT 27 10/24/2012   AST 19 10/24/2012   NA 136 10/24/2012   K 3.6 10/24/2012   CL 100 10/24/2012   CREATININE 1.1 10/24/2012   BUN 18 10/24/2012   CO2 30 10/24/2012   TSH 1.08 10/24/2012   PSA 0.66 09/06/2007   INR 0.95 08/05/2011   HGBA1C 6.3 10/24/2012         Assessment & Plan:

## 2013-07-17 NOTE — Assessment & Plan Note (Signed)
Continue with current prescription therapy as reflected on the Med list.  

## 2013-07-17 NOTE — Assessment & Plan Note (Signed)
Continue with current prescription therapy as reflected on the Med list. R carot stent Dr Estanislado Pandy

## 2013-07-17 NOTE — Progress Notes (Signed)
Pre visit review using our clinic review tool, if applicable. No additional management support is needed unless otherwise documented below in the visit note. 

## 2013-07-17 NOTE — Assessment & Plan Note (Signed)
Medications - none. Dr Buddy Duty stopped Amaryl due to low CBG 2015

## 2013-07-18 ENCOUNTER — Telehealth: Payer: Self-pay | Admitting: Internal Medicine

## 2013-07-18 NOTE — Telephone Encounter (Signed)
Relevant patient education mailed to patient.  

## 2013-07-19 ENCOUNTER — Ambulatory Visit (HOSPITAL_COMMUNITY)
Admission: RE | Admit: 2013-07-19 | Discharge: 2013-07-19 | Disposition: A | Discharge status: Home / Self Care | Payer: Medicare Other | Source: Ambulatory Visit | Attending: Interventional Radiology | Admitting: Interventional Radiology

## 2013-07-19 DIAGNOSIS — I771 Stricture of artery: Secondary | ICD-10-CM

## 2013-07-19 DIAGNOSIS — R42 Dizziness and giddiness: Secondary | ICD-10-CM | POA: Diagnosis not present

## 2013-07-19 DIAGNOSIS — Z9889 Other specified postprocedural states: Secondary | ICD-10-CM | POA: Diagnosis not present

## 2013-08-07 ENCOUNTER — Other Ambulatory Visit: Payer: Self-pay | Admitting: *Deleted

## 2013-08-07 MED ORDER — CLOPIDOGREL BISULFATE 75 MG PO TABS: 75.0000 mg | ORAL_TABLET | Freq: Every day | ORAL | Status: DC

## 2013-08-07 MED ORDER — PANTOPRAZOLE SODIUM 40 MG PO TBEC: 40.0000 mg | DELAYED_RELEASE_TABLET | Freq: Every day | ORAL | Status: DC

## 2013-08-21 ENCOUNTER — Encounter: Payer: Self-pay | Admitting: Nurse Practitioner

## 2013-08-21 ENCOUNTER — Encounter (INDEPENDENT_AMBULATORY_CARE_PROVIDER_SITE_OTHER): Payer: Self-pay

## 2013-08-21 ENCOUNTER — Ambulatory Visit (INDEPENDENT_AMBULATORY_CARE_PROVIDER_SITE_OTHER): Payer: Medicare Other | Admitting: Nurse Practitioner

## 2013-08-21 VITALS — BP 121/65 | HR 73 | Wt 220.0 lb

## 2013-08-21 DIAGNOSIS — I639 Cerebral infarction, unspecified: Secondary | ICD-10-CM

## 2013-08-21 DIAGNOSIS — I6350 Cerebral infarction due to unspecified occlusion or stenosis of unspecified cerebral artery: Secondary | ICD-10-CM | POA: Diagnosis not present

## 2013-08-21 DIAGNOSIS — I6340 Cerebral infarction due to embolism of unspecified cerebral artery: Secondary | ICD-10-CM

## 2013-08-21 NOTE — Patient Instructions (Signed)
Continue clopidogrel 75 mg orally every day for secondary stroke prevention and maintain strict control of hypertension with blood pressure goal below 130/90, diabetes with hemoglobin A1c goal below 6.5% and lipids with LDL cholesterol goal below 100 mg/dL.  Followup in 6 months, sooner as needed.  Stroke Prevention Some medical conditions and behaviors are associated with an increased chance of having a stroke. You may prevent a stroke by making healthy choices and managing medical conditions. HOW CAN I REDUCE MY RISK OF HAVING A STROKE?   Stay physically active. Get at least 30 minutes of activity on most or all days.  Do not smoke. It may also be helpful to avoid exposure to secondhand smoke.  Limit alcohol use. Moderate alcohol use is considered to be:  No more than 2 drinks per day for men.  No more than 1 drink per day for nonpregnant women.  Eat healthy foods. This involves  Eating 5 or more servings of fruits and vegetables a day.  Following a diet that addresses high blood pressure (hypertension), high cholesterol, diabetes, or obesity.  Manage your cholesterol levels.  A diet low in saturated fat, trans fat, and cholesterol and high in fiber may control cholesterol levels.  Take any prescribed medicines to control cholesterol as directed by your health care provider.  Manage your diabetes.  A controlled-carbohydrate, controlled-sugar diet is recommended to manage diabetes.  Take any prescribed medicines to control diabetes as directed by your health care provider.  Control your hypertension.  A low-salt (sodium), low-saturated fat, low-trans fat, and low-cholesterol diet is recommended to manage hypertension.  Take any prescribed medicines to control hypertension as directed by your health care provider.  Maintain a healthy weight.  A reduced-calorie, low-sodium, low-saturated fat, low-trans fat, low-cholesterol diet is recommended to manage weight.  Stop drug  abuse.  Avoid taking birth control pills.  Talk to your health care provider about the risks of taking birth control pills if you are over 80 years old, smoke, get migraines, or have ever had a blood clot.  Get evaluated for sleep disorders (sleep apnea).  Talk to your health care provider about getting a sleep evaluation if you snore a lot or have excessive sleepiness.  Take medicines as directed by your health care provider.  For some people, aspirin or blood thinners (anticoagulants) are helpful in reducing the risk of forming abnormal blood clots that can lead to stroke. If you have the irregular heart rhythm of atrial fibrillation, you should be on a blood thinner unless there is a good reason you cannot take them.  Understand all your medicine instructions.  Make sure that other other conditions (such as anemia or atherosclerosis) are addressed. SEEK IMMEDIATE MEDICAL CARE IF:   You have sudden weakness or numbness of the face, arm, or leg, especially on one side of the body.  Your face or eyelid droops to one side.  You have sudden confusion.  You have trouble speaking (aphasia) or understanding.  You have sudden trouble seeing in one or both eyes.  You have sudden trouble walking.  You have dizziness.  You have a loss of balance or coordination.  You have a sudden, severe headache with no known cause.  You have new chest pain or an irregular heartbeat. Any of these symptoms may represent a serious problem that is an emergency. Do not wait to see if the symptoms will go away. Get medical help at once. Call your local emergency services  (911 in [place] Do  not drive yourself to the hospital. Document Released: 03/26/2004 Document Revised: 12/07/2012 Document Reviewed: 08/19/2012 [facility] Patient Information 2015 [place], [place]. This information is not intended to replace advice given to you by your health care provider. Make sure you discuss any questions you have with  your health care provider.

## 2013-08-21 NOTE — Progress Notes (Signed)
PATIENT: Andrew SELVEY Sr. DOB: [birth year]  REASON FOR VISIT: routine follow up for stroke HISTORY FROM: patient  HISTORY OF PRESENT ILLNESS: 73 year old right-handed Caucasian male here for six-month followup of cerebral infarction on 08/05/2011.   UPDATE 08/21/13 (LL): Since last visit, Mr. Kirtz has not had any recurrent stroke symptoms.  He has many of the same complaints he has had in the past.  He complains of dizziness and lower extremeity weakness, with a feeling as if his legs are going to give out.  Workup for this including MRIs of cervical, thoracic and lumbar spine were unremarkable. He recently had an episode of chest pain that he was not sure if it was hiatal hernia or his heart.  He did not seek medical attention for the chest pain.  He took 2 nitroglycerin from his wife and the pain subsided.  His wife tried to get him a sooner appointment at the Cardiologists office but was unsuccessful.  He has follow up appointment with cardiology in October. He is having more frequent headaches, which are frontal sharp pains that last not more than an hour.  After the pain subsides his wife states that he is very tired and must sleep. 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 His wife is concerned that these headaches and lightheadedness may be related to his carotid disease, but last carotid doppler study on 04/03/13 showed no evidence of significant ICA stenosis, stent was patent.  He has had recent follow up with Dr. Kathi Ludwig.  Blood pressure is well controlled, it is 121/65 in the office today.   Tolerating Plavix 75 mg and baby aspirin daily and well without significant bleeding or bruising.  UPDATE 10/17/12 (LL): Patient comes in for follow up since last visit on 07/18/12. States he has been doing well, still working full-time and Cabin crew two sermons on Sundays. Still has fatigue since stroke, never regained same level of energy. Trying to eat better. Tolerating  Plavix and baby aspirin daily. Patient denies medication side effects, with no signs of bleeding or excessive bruising. Has appt. To see Dr. Stanford Breed (cardiology) for check up soon. MRI of cervical, thoracic and lumbar spine since last visit were unremarkable. Carotid dopplers were unchanged. Is bothered by restless legs at night some but does not want to take another medicine for it. Wears cpap but reportedly does not ever feel like he gets a good night's sleep. Has had sweating and chills the last two nights but feels fine during the day. Has appointment with PCP on Monday.   Update 07/18/12: Returns for followup visit since 01/25/2012. He is doing well without recurrent neurovascular symptoms. He is being managed by Dr. Halford Chessman for sleep apnea. He reports his blood pressure at home ranges in the 130s over 60s, but his meter needs the batteries. Tolerating Plavix 75 mg and baby aspirin daily and well without significant bleeding or bruising. He had a carotid doppler done on 12/14/2011 and revealed a patent right ICA stent without any increased velocities. No significant left ICAs stenosis noted. Continues to have tinnitus but is not worse. New complaint of bilateral LE weakness that came on suddenly yesterday while getting ready for church. Feel weakness in both legs and had to lay down to rest. After about 3 hours he felt much better.Denied back or radicular pain but has past h/o back pain and MRI 2 years ago had shown degenerative changes.   Prior HPI:  He presented to [facility] with an accident being off balance it lasted about 1 minute Thursday, resolved and returned on Saturday, and became acutely worse on 08/05/2011. MRI of brain shows bilateral subcentimeter areas of acute infarction. TEE shows no embolus. Prolonged cardiac monitor was negative for atrophic relation, NSR with occasional PVCs. Vascular risk factors include hypertension, hyperlipidemia, OSA with CPAP use and diabetes mellitus. Uses  CPAP 5 days per week 5-8 hours per night. On atorvastatin and Plavix without side effects.   Review of Systems was performed and notable only for:  constitutional: chills, fatigue  Eyes: eye discharge, eye itching, light sensitivity cardiovascular: murmur, leg swelling, palpitations, chest pain respiratory: cough, shortness of breath, chest tightness Hematology/Lymph: easy bleeding, easy bruising  musculoskeletal: back pain neurological: headache, dizziness, weakness sleep: apnea, snoring  ALLERGIES: No Known Allergies  HOME MEDICATIONS: Outpatient Prescriptions Prior to Visit  Medication Sig Dispense Refill  . acyclovir (ZOVIRAX) 200 MG capsule TAKE ONE CAPSULE THREE TIMES A DAY AS NEEDED  90 capsule  3  . ammonium lactate (LAC-HYDRIN) 12 % lotion Apply 1 application topically 2 (two) times daily.       Marland Kitchen aspirin EC 81 MG tablet Take 81 mg by mouth daily.       Marland Kitchen atorvastatin (LIPITOR) 20 MG tablet take 1 tablet by mouth once daily  30 tablet  6  . cholecalciferol (VITAMIN D) 1000 UNITS tablet Take 1 tablet (1,000 Units total) by mouth daily.  100 tablet  3  . citalopram (CELEXA) 10 MG tablet TAKE ONE TABLET BY MOUTH DAILY  90 tablet  0  . clonazePAM (KLONOPIN) 0.5 MG tablet TAKE 1 TABLET EVERY 8 HOURS AS NEEDED  90 tablet  5  . clopidogrel (PLAVIX) 75 MG tablet Take 1 tablet (75 mg total) by mouth daily.  30 tablet  5  . Cyanocobalamin (VITAMIN B-12) 1000 MCG SUBL Place 1 tablet (1,000 mcg total) under the tongue daily.  100 tablet  3  . hydrochlorothiazide (HYDRODIURIL) 25 MG tablet take 1 tablet by mouth once daily  90 tablet  3  . ONE TOUCH ULTRA TEST test strip USE 1 STRIP TO CHECK BLOOD SUGAR DAILY  50 each  3  . ONETOUCH DELICA LANCETS 40J MISC USE ONCE DAILY  100 each  3  . pantoprazole (PROTONIX) 40 MG tablet Take 1 tablet (40 mg total) by mouth daily.  30 tablet  5  . PREVIDENT 5000 BOOSTER PLUS 1.1 % PSTE       . Tamsulosin HCl (FLOMAX) 0.4 MG CAPS Take 1 capsule by mouth  at bedtime.      Marland Kitchen atorvastatin (LIPITOR) 20 MG tablet Take 20 mg by mouth daily.       No facility-administered medications prior to visit.     PHYSICAL EXAM  Filed Vitals:   08/21/13 1355  BP: 121/65  Pulse: 73  Weight: 220 lb (99.791 kg)   Body mass index is 31.11 kg/(m^2). No exam data present   GENERAL EXAM: Patient is in no distress, well developed and well groomed.  HEAD: Symmetric facial features.  EARS, NOSE, and THROAT: Normal.  NECK: Supple, no JVD  RESPIRATORY: Lungs CTA, no wheezes, rhonchi or rub  CARDIOVASCULAR:  Regular rate and rhythm, 3/6 systolic murmur, no carotid bruits  SKIN: No rash, no bruising   NEUROLOGIC:  MENTAL STATUS: awake, alert and oriented to person, place and time, language fluent, comprehension intact  CRANIAL NERVE: pupils equal and reactive to light, visual fields full to confrontation, extraocular  muscles intact, no nystagmus, facial sensation and strength symmetric, uvula midline, shoulder shrug symmetric, tongue midline.  MOTOR: normal bulk and tone, full strength in the BUE, BLE  SENSORY: normal and symmetric to light touch, pinprick, temperature, vibration and proprioception  COORDINATION: finger-nose-finger, fine finger movements normal  REFLEXES: deep tendon reflexes brisk, Bilat bicep 3+, triceps 2+, Left Knee 2+, Right Knee 3+, Bilat ankles 1+  GAIT/STATION: narrow based gait; able to walk on toes, heels and tandem; romberg is negative. No assistive device.  ASSESSMENT AND PLAN 73 y.o. year old Caucasian male here for follow up of cerebral infarction in the supratentorial region, likely embolic related on 05/02/18. No source found after extensive workup. He is s/p right carotid stent placement on 07/09/11 which remains patent.  Vascular risk factors include hypertension, hyperlipidemia, and diabetes.   PLAN:  I had long discussion with patient and his wife about his previous stroke, test results and my evaluation of his symptoms.  I  encouraged him to try again to get a sooner appointment with Cardiology to alleviate some of his fears, and went over when to seek immediate attention for headaches or chest pain. Continue clopidogrel 75 mg orally every day for secondary stroke prevention and maintain strict control of hypertension with blood pressure goal below 130/90, diabetes with hemoglobin A1c goal below 6.5% and lipids with LDL cholesterol goal below 100 mg/dL.  Followup in 6 months, sooner as needed.  Philmore Pali, MSN, NP-C 08/22/2013, 10:00 AM [facility] [address] [place], [place] [address] [phone]  Note: This document was prepared with digital dictation and possible smart phrase technology. Any transcriptional errors that result from this process are unintentional.

## 2013-08-28 DIAGNOSIS — D1801 Hemangioma of skin and subcutaneous tissue: Secondary | ICD-10-CM | POA: Diagnosis not present

## 2013-08-28 DIAGNOSIS — L570 Actinic keratosis: Secondary | ICD-10-CM | POA: Diagnosis not present

## 2013-08-28 DIAGNOSIS — Z85828 Personal history of other malignant neoplasm of skin: Secondary | ICD-10-CM | POA: Diagnosis not present

## 2013-08-28 DIAGNOSIS — L821 Other seborrheic keratosis: Secondary | ICD-10-CM | POA: Diagnosis not present

## 2013-08-28 DIAGNOSIS — L819 Disorder of pigmentation, unspecified: Secondary | ICD-10-CM | POA: Diagnosis not present

## 2013-08-30 ENCOUNTER — Other Ambulatory Visit: Payer: Self-pay | Admitting: Internal Medicine

## 2013-08-30 ENCOUNTER — Telehealth: Payer: Self-pay | Admitting: *Deleted

## 2013-08-30 DIAGNOSIS — E119 Type 2 diabetes mellitus without complications: Secondary | ICD-10-CM

## 2013-08-30 DIAGNOSIS — E785 Hyperlipidemia, unspecified: Secondary | ICD-10-CM

## 2013-08-30 NOTE — Telephone Encounter (Signed)
Patient has an upcoming appointment. Lipid panel ordered.

## 2013-09-01 NOTE — Progress Notes (Signed)
I agree with above 

## 2013-09-14 DIAGNOSIS — Z85828 Personal history of other malignant neoplasm of skin: Secondary | ICD-10-CM | POA: Diagnosis not present

## 2013-09-14 DIAGNOSIS — L0100 Impetigo, unspecified: Secondary | ICD-10-CM | POA: Diagnosis not present

## 2013-09-14 DIAGNOSIS — L259 Unspecified contact dermatitis, unspecified cause: Secondary | ICD-10-CM | POA: Diagnosis not present

## 2013-09-20 ENCOUNTER — Other Ambulatory Visit: Payer: Self-pay | Admitting: Internal Medicine

## 2013-09-22 ENCOUNTER — Other Ambulatory Visit: Payer: Self-pay | Admitting: Radiology

## 2013-09-22 DIAGNOSIS — I6529 Occlusion and stenosis of unspecified carotid artery: Secondary | ICD-10-CM

## 2013-09-27 ENCOUNTER — Telehealth (HOSPITAL_COMMUNITY): Payer: Self-pay | Admitting: Interventional Radiology

## 2013-09-27 NOTE — Telephone Encounter (Signed)
Called pt, left VM for him to call to schedule f/u US carotids. JM 

## 2013-10-03 ENCOUNTER — Emergency Department (HOSPITAL_COMMUNITY): Payer: Medicare Other

## 2013-10-03 ENCOUNTER — Encounter (HOSPITAL_COMMUNITY): Payer: Self-pay | Admitting: Emergency Medicine

## 2013-10-03 ENCOUNTER — Emergency Department (HOSPITAL_COMMUNITY)
Admission: EM | Admit: 2013-10-03 | Discharge: 2013-10-03 | Disposition: A | Discharge status: Home / Self Care | Payer: Medicare Other | Attending: Emergency Medicine | Admitting: Emergency Medicine

## 2013-10-03 DIAGNOSIS — Z9861 Coronary angioplasty status: Secondary | ICD-10-CM | POA: Insufficient documentation

## 2013-10-03 DIAGNOSIS — G4733 Obstructive sleep apnea (adult) (pediatric): Secondary | ICD-10-CM | POA: Diagnosis not present

## 2013-10-03 DIAGNOSIS — Z7982 Long term (current) use of aspirin: Secondary | ICD-10-CM | POA: Diagnosis not present

## 2013-10-03 DIAGNOSIS — G8929 Other chronic pain: Secondary | ICD-10-CM | POA: Diagnosis not present

## 2013-10-03 DIAGNOSIS — Z8673 Personal history of transient ischemic attack (TIA), and cerebral infarction without residual deficits: Secondary | ICD-10-CM | POA: Diagnosis not present

## 2013-10-03 DIAGNOSIS — Z87448 Personal history of other diseases of urinary system: Secondary | ICD-10-CM | POA: Diagnosis not present

## 2013-10-03 DIAGNOSIS — R011 Cardiac murmur, unspecified: Secondary | ICD-10-CM | POA: Insufficient documentation

## 2013-10-03 DIAGNOSIS — R52 Pain, unspecified: Secondary | ICD-10-CM | POA: Diagnosis not present

## 2013-10-03 DIAGNOSIS — K219 Gastro-esophageal reflux disease without esophagitis: Secondary | ICD-10-CM | POA: Insufficient documentation

## 2013-10-03 DIAGNOSIS — Z85828 Personal history of other malignant neoplasm of skin: Secondary | ICD-10-CM | POA: Insufficient documentation

## 2013-10-03 DIAGNOSIS — R4182 Altered mental status, unspecified: Secondary | ICD-10-CM | POA: Diagnosis not present

## 2013-10-03 DIAGNOSIS — Z9889 Other specified postprocedural states: Secondary | ICD-10-CM | POA: Diagnosis not present

## 2013-10-03 DIAGNOSIS — I10 Essential (primary) hypertension: Secondary | ICD-10-CM | POA: Diagnosis not present

## 2013-10-03 DIAGNOSIS — I2510 Atherosclerotic heart disease of native coronary artery without angina pectoris: Secondary | ICD-10-CM | POA: Insufficient documentation

## 2013-10-03 DIAGNOSIS — R51 Headache: Secondary | ICD-10-CM | POA: Diagnosis not present

## 2013-10-03 DIAGNOSIS — R519 Headache, unspecified: Secondary | ICD-10-CM

## 2013-10-03 DIAGNOSIS — M129 Arthropathy, unspecified: Secondary | ICD-10-CM | POA: Diagnosis not present

## 2013-10-03 DIAGNOSIS — Z79899 Other long term (current) drug therapy: Secondary | ICD-10-CM | POA: Insufficient documentation

## 2013-10-03 DIAGNOSIS — E785 Hyperlipidemia, unspecified: Secondary | ICD-10-CM | POA: Insufficient documentation

## 2013-10-03 DIAGNOSIS — F411 Generalized anxiety disorder: Secondary | ICD-10-CM | POA: Diagnosis not present

## 2013-10-03 DIAGNOSIS — Z87828 Personal history of other (healed) physical injury and trauma: Secondary | ICD-10-CM | POA: Insufficient documentation

## 2013-10-03 LAB — CBC WITH DIFFERENTIAL/PLATELET
Basophils Absolute: 0 10*3/uL (ref 0.0–0.1)
Basophils Relative: 0 % (ref 0–1)
EOS ABS: 0.2 10*3/uL (ref 0.0–0.7)
EOS PCT: 4 % (ref 0–5)
HEMATOCRIT: 41.6 % (ref 39.0–52.0)
HEMOGLOBIN: 13.9 g/dL (ref 13.0–17.0)
LYMPHS ABS: 1.4 10*3/uL (ref 0.7–4.0)
Lymphocytes Relative: 24 % (ref 12–46)
MCH: 29.6 pg (ref 26.0–34.0)
MCHC: 33.4 g/dL (ref 30.0–36.0)
MCV: 88.7 fL (ref 78.0–100.0)
MONO ABS: 0.7 10*3/uL (ref 0.1–1.0)
MONOS PCT: 12 % (ref 3–12)
Neutro Abs: 3.5 10*3/uL (ref 1.7–7.7)
Neutrophils Relative %: 60 % (ref 43–77)
PLATELETS: 182 10*3/uL (ref 150–400)
RBC: 4.69 MIL/uL (ref 4.22–5.81)
RDW: 12.9 % (ref 11.5–15.5)
WBC: 5.9 10*3/uL (ref 4.0–10.5)

## 2013-10-03 LAB — BASIC METABOLIC PANEL
Anion gap: 10 (ref 5–15)
BUN: 23 mg/dL (ref 6–23)
CALCIUM: 9.1 mg/dL (ref 8.4–10.5)
CO2: 27 meq/L (ref 19–32)
CREATININE: 0.98 mg/dL (ref 0.50–1.35)
Chloride: 104 mEq/L (ref 96–112)
GFR calc Af Amer: >90 mL/min (ref >90)
GFR, EST NON AFRICAN AMERICAN: 81 mL/min — AB (ref >90)
Glucose, Bld: 115 mg/dL — ABNORMAL HIGH (ref 70–99)
Potassium: 4.8 mEq/L (ref 3.7–5.3)
Sodium: 141 mEq/L (ref 137–147)

## 2013-10-03 MED ORDER — SODIUM CHLORIDE 0.9 % IV SOLN
1000.0000 mL | Freq: Once | INTRAVENOUS | Status: AC
  Administered 2013-10-03: 1000 mL via INTRAVENOUS

## 2013-10-03 MED ORDER — DIPHENHYDRAMINE HCL 50 MG/ML IJ SOLN
25.0000 mg | Freq: Once | INTRAMUSCULAR | Status: AC
  Administered 2013-10-03: 25 mg via INTRAVENOUS
  Filled 2013-10-03: qty 1

## 2013-10-03 MED ORDER — TRAMADOL HCL 50 MG PO TABS
50.0000 mg | ORAL_TABLET | Freq: Once | ORAL | Status: AC
  Administered 2013-10-03: 50 mg via ORAL
  Filled 2013-10-03: qty 1

## 2013-10-03 MED ORDER — METOCLOPRAMIDE HCL 5 MG/ML IJ SOLN
10.0000 mg | Freq: Once | INTRAMUSCULAR | Status: AC
  Administered 2013-10-03: 10 mg via INTRAVENOUS
  Filled 2013-10-03: qty 2

## 2013-10-03 MED ORDER — SODIUM CHLORIDE 0.9 % IV SOLN
1000.0000 mL | INTRAVENOUS | Status: DC
  Administered 2013-10-03: 1000 mL via INTRAVENOUS

## 2013-10-03 NOTE — ED Notes (Signed)
Called doppler, unable to give time frame of when pt when have scan performed.
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
## 2013-10-03 NOTE — ED Notes (Signed)
Heart healthy food tray ordered.  

## 2013-10-03 NOTE — ED Provider Notes (Addendum)
Dr Roxanne Mins had signed out at [facility] that pt ready for d/c, and had requested while here carotid dopplers be done (which he has to have done periodically to reassess prior procedure, and that when done may be d/c'd to home.  Recheck pt, comfortable appearing, no distress, ambulatory about ED w steady gait. Discussed results w pt.  Andrew Malone, Andrew Sr. Male 24-Dec-1940 [id number]            Progress Notes by Elmo Putt at 10/03/2013 11:44 AM    Author: Doyne Keel Simonetti Service: Vascular Lab Author Type: Cardiovascular Sonographer   Filed: 10/03/2013 11:54 AM Note Time: 10/03/2013 11:44 AM Status: Addendum   Editor: Doyne Keel Simonetti (Cardiovascular Sonographer)     Related Notes: Original Note by Elmo Putt (Cardiovascular Sonographer) filed at 10/03/2013 11:45 AM    *PRELIMINARY RESULTS*  Vascular Ultrasound  Carotid Duplex (Doppler) has been completed.  Findings suggest 1-39% internal carotid artery stenosis bilaterally. Vertebral arteries are patent with antegrade flow.  No change when compared with prior study from 04/03/2013.  10/03/2013 11:45 AM  Maudry Mayhew, RVT, RDCS, RDMS     On discussion w family, spouse concerned as earlier pts responsiveness seemed slow, states was pausing when thinking of what to say, she states symptoms worried her as somewhat similar to his prior stroke.  Additional imaging ordered - noted below.  Neg for acute process.  Recheck pt comfortable. Speech normal. No numbness/weakness. Headache much improved. Afeb. No neck stiffness/rigidity.  Appears stable for d/c.  Results for orders placed during the hospital encounter of 10/03/13  CBC WITH DIFFERENTIAL      Result Value Ref Range   WBC 5.9  4.0 - 10.5 K/uL   RBC 4.69  4.22 - 5.81 MIL/uL   Hemoglobin 13.9  13.0 - 17.0 g/dL   HCT 41.6  39.0 - 52.0 %   MCV 88.7  78.0 - 100.0 fL   MCH 29.6  26.0 - 34.0 pg   MCHC 33.4  30.0 - 36.0 g/dL   RDW 12.9  11.5 - 15.5 %   Platelets 182   150 - 400 K/uL   Neutrophils Relative % 60  43 - 77 %   Neutro Abs 3.5  1.7 - 7.7 K/uL   Lymphocytes Relative 24  12 - 46 %   Lymphs Abs 1.4  0.7 - 4.0 K/uL   Monocytes Relative 12  3 - 12 %   Monocytes Absolute 0.7  0.1 - 1.0 K/uL   Eosinophils Relative 4  0 - 5 %   Eosinophils Absolute 0.2  0.0 - 0.7 K/uL   Basophils Relative 0  0 - 1 %   Basophils Absolute 0.0  0.0 - 0.1 K/uL  BASIC METABOLIC PANEL      Result Value Ref Range   Sodium 141  137 - 147 mEq/L   Potassium 4.8  3.7 - 5.3 mEq/L   Chloride 104  96 - 112 mEq/L   CO2 27  19 - 32 mEq/L   Glucose, Bld 115 (*) 70 - 99 mg/dL   BUN 23  6 - 23 mg/dL   Creatinine, Ser 0.98  0.50 - 1.35 mg/dL   Calcium 9.1  8.4 - 10.5 mg/dL   GFR calc non Af Amer 81 (*) >90 mL/min   GFR calc Af Amer >90  >90 mL/min   Anion gap 10  5 - 15   Ct Head Wo Contrast  10/03/2013   CLINICAL DATA:  Headaches  EXAM: CT HEAD WITHOUT CONTRAST  TECHNIQUE: Contiguous axial images were obtained from the base of the skull through the vertex without intravenous contrast.  COMPARISON:  08/05/2011  FINDINGS: The bony calvarium is intact. No gross soft tissue abnormality is noted. No findings to suggest acute hemorrhage, acute infarction or space-occupying mass lesion are noted.  IMPRESSION: No acute abnormality noted.   Electronically Signed   By: Inez Catalina M.D.   On: 10/03/2013 07:30   Mr Jodene Nam Head Wo Contrast  10/03/2013   CLINICAL DATA:  Headaches. Altered mental status. Episodes of new aphasia. Hypertension. Hyperlipidemia. Prior right internal carotid artery stenting. Remote small infarcts.  EXAM: MRI HEAD WITHOUT CONTRAST  MRA HEAD WITHOUT CONTRAST  TECHNIQUE: Multiplanar, multiecho pulse sequences of the brain and surrounding structures were obtained without intravenous contrast. Angiographic images of the head were obtained using MRA technique without contrast.  COMPARISON:  10/03/2013 CT.  08/05/2011 MR and MR angiogram.  FINDINGS: MRI HEAD FINDINGS  No acute  infarct.  No intracranial hemorrhage.  Mild small vessel disease type changes.  No hydrocephalus.  No intracranial mass lesion noted on this unenhanced exam.  Cervical medullary junction, pituitary region, pineal region and orbital structures unremarkable.  MRA HEAD FINDINGS  Small caliber right vertebral artery ends in a posterior inferior cerebellar artery distribution. Atherosclerotic type changes right vertebral artery with moderate narrowing.  No significant stenosis left vertebral artery.  Nonvisualized left posterior inferior cerebellar artery with large left anterior inferior cerebellar artery.  No high-grade stenosis of the basilar artery.  Nonvisualized right anterior inferior cerebellar artery.  Small irregular narrowed left superior cerebellar artery.  Fetal type contribution to the posterior cerebral artery bilaterally.  Posterior cerebral artery distal branch vessel irregularity and narrowing bilaterally.  Ectatic distal vertical cervical segment of the right internal carotid artery. Similar caliber of the internal carotid arteries.  Atherosclerotic type changes with mild ectasia right internal carotid artery cavernous segment without saccular aneurysm detected.  Middle cerebral artery mild branch vessel irregularity bilaterally.  IMPRESSION: MRI HEAD  No acute infarct.  No intracranial hemorrhage.  Mild small vessel disease type changes.  MRA HEAD  Intracranial atherosclerotic type changes have changed minimally since prior exam as detailed above.   Electronically Signed   By: Chauncey Cruel M.D.   On: 10/03/2013 14:16   Mr Brain Wo Contrast  10/03/2013   CLINICAL DATA:  Headaches. Altered mental status. Episodes of new aphasia. Hypertension. Hyperlipidemia. Prior right internal carotid artery stenting. Remote small infarcts.  EXAM: MRI HEAD WITHOUT CONTRAST  MRA HEAD WITHOUT CONTRAST  TECHNIQUE: Multiplanar, multiecho pulse sequences of the brain and surrounding structures were obtained without  intravenous contrast. Angiographic images of the head were obtained using MRA technique without contrast.  COMPARISON:  10/03/2013 CT.  08/05/2011 MR and MR angiogram.  FINDINGS: MRI HEAD FINDINGS  No acute infarct.  No intracranial hemorrhage.  Mild small vessel disease type changes.  No hydrocephalus.  No intracranial mass lesion noted on this unenhanced exam.  Cervical medullary junction, pituitary region, pineal region and orbital structures unremarkable.  MRA HEAD FINDINGS  Small caliber right vertebral artery ends in a posterior inferior cerebellar artery distribution. Atherosclerotic type changes right vertebral artery with moderate narrowing.  No significant stenosis left vertebral artery.  Nonvisualized left posterior inferior cerebellar artery with large left anterior inferior cerebellar artery.  No high-grade stenosis of the basilar artery.  Nonvisualized right anterior inferior cerebellar artery.  Small irregular narrowed left superior cerebellar artery.  Fetal type  contribution to the posterior cerebral artery bilaterally.  Posterior cerebral artery distal branch vessel irregularity and narrowing bilaterally.  Ectatic distal vertical cervical segment of the right internal carotid artery. Similar caliber of the internal carotid arteries.  Atherosclerotic type changes with mild ectasia right internal carotid artery cavernous segment without saccular aneurysm detected.  Middle cerebral artery mild branch vessel irregularity bilaterally.  IMPRESSION: MRI HEAD  No acute infarct.  No intracranial hemorrhage.  Mild small vessel disease type changes.  MRA HEAD  Intracranial atherosclerotic type changes have changed minimally since prior exam as detailed above.   Electronically Signed   By: Chauncey Cruel M.D.   On: 10/03/2013 14:16       Mirna Mires, MD 10/03/13 [phone]

## 2013-10-03 NOTE — ED Notes (Signed)
EDP AWARE DOPPLER HAS RESULTED

## 2013-10-03 NOTE — ED Notes (Signed)
Pt returned from radiology.

## 2013-10-03 NOTE — ED Notes (Signed)
Patient here from home with complaint of headache which started about 2 hours ago. Headache is described as diffuse. Patient reports previous episode of the same 3-4 months ago; at that time he was found to have a near occlusion of his right carotid artery.

## 2013-10-03 NOTE — Discharge Instructions (Signed)
Follow up with your doctor/neurologist in the next few days.    Your carotid ultrasound results are below - follow up with your doctor:  Andrew Malone, Andrew Malone. Male 04/17/1940 [id number]            Progress Notes by Elmo Putt at 10/03/2013 11:44 AM    Author: Doyne Keel Simonetti Service: Vascular Lab Author Type: Cardiovascular Sonographer   Filed: 10/03/2013 11:54 AM Note Time: 10/03/2013 11:44 AM Status: Addendum   Editor: Doyne Keel Simonetti (Cardiovascular Sonographer)     Related Notes: Original Note by Elmo Putt (Cardiovascular Sonographer) filed at 10/03/2013 11:45 AM    *PRELIMINARY RESULTS*  Vascular Ultrasound  Carotid Duplex (Doppler) has been completed.  Findings suggest 1-39% internal carotid artery stenosis bilaterally. Vertebral arteries are patent with antegrade flow.  No change when compared with prior study from 04/03/2013.  10/03/2013 11:45 AM       Return to ER if worse, new symptoms, fevers, severe headache, persistent vomiting, numbness/weakness, other concern.  You were given pain medication in the ER - no driving for the next 4 hours.      General Headache Without Cause A headache is pain or discomfort felt around the head or neck area. The specific cause of a headache may not be found. There are many causes and types of headaches. A few common ones are:  Tension headaches.  Migraine headaches.  Cluster headaches.  Chronic daily headaches. HOME CARE INSTRUCTIONS   Keep all follow-up appointments with your caregiver or any specialist referral.  Only take over-the-counter or prescription medicines for pain or discomfort as directed by your caregiver.  Lie down in a dark, quiet room when you have a headache.  Keep a headache journal to find out what may trigger your migraine headaches. For example, write down:  What you eat and drink.  How much sleep you get.  Any change to your diet or medicines.  Try massage or other  relaxation techniques.  Put ice packs or heat on the head and neck. Use these 3 to 4 times per day for 15 to 20 minutes each time, or as needed.  Limit stress.  Sit up straight, and do not tense your muscles.  Quit smoking if you smoke.  Limit alcohol use.  Decrease the amount of caffeine you drink, or stop drinking caffeine.  Eat and sleep on a regular schedule.  Get 7 to 9 hours of sleep, or as recommended by your caregiver.  Keep lights dim if bright lights bother you and make your headaches worse. SEEK MEDICAL CARE IF:   You have problems with the medicines you were prescribed.  Your medicines are not working.  You have a change from the usual headache.  You have nausea or vomiting. SEEK IMMEDIATE MEDICAL CARE IF:   Your headache becomes severe.  You have a fever.  You have a stiff neck.  You have loss of vision.  You have muscular weakness or loss of muscle control.  You start losing your balance or have trouble walking.  You feel faint or pass out.  You have severe symptoms that are different from your first symptoms. MAKE SURE YOU:   Understand these instructions.  Will watch your condition.  Will get help right away if you are not doing well or get worse. Document Released: 02/16/2005 Document Revised: 05/11/2011 Document Reviewed: 03/04/2011 [facility] Patient Information 2015 [place], [place]. This information is not intended to replace advice given to you by your health  care provider. Make sure you discuss any questions you have with your health care provider.

## 2013-10-03 NOTE — Progress Notes (Addendum)
*  PRELIMINARY RESULTS* Vascular Ultrasound Carotid Duplex (Doppler) has been completed.   Findings suggest 1-39% internal carotid artery stenosis bilaterally. Vertebral arteries are patent with antegrade flow.  No change when compared with prior study from 04/03/2013.  10/03/2013 11:45 AM Maudry Mayhew, RVT, RDCS, RDMS

## 2013-10-05 ENCOUNTER — Telehealth (HOSPITAL_BASED_OUTPATIENT_CLINIC_OR_DEPARTMENT_OTHER): Payer: Self-pay | Admitting: Emergency Medicine

## 2013-10-05 NOTE — Telephone Encounter (Signed)
Called pt and spoke with pt's wife Hassan Rowan to set up an appt on 10/17/13 with Dr. Leonie Man, 1 week f/u per ER note. I advised the wife that if the pt has any other problems, question or concerns to call the office. Wife verbalized understanding.

## 2013-10-09 DIAGNOSIS — Z125 Encounter for screening for malignant neoplasm of prostate: Secondary | ICD-10-CM | POA: Diagnosis not present

## 2013-10-09 DIAGNOSIS — R972 Elevated prostate specific antigen [PSA]: Secondary | ICD-10-CM | POA: Diagnosis not present

## 2013-10-09 DIAGNOSIS — N401 Enlarged prostate with lower urinary tract symptoms: Secondary | ICD-10-CM | POA: Diagnosis not present

## 2013-10-09 DIAGNOSIS — R351 Nocturia: Secondary | ICD-10-CM | POA: Diagnosis not present

## 2013-10-09 DIAGNOSIS — N138 Other obstructive and reflux uropathy: Secondary | ICD-10-CM | POA: Diagnosis not present

## 2013-10-09 DIAGNOSIS — N139 Obstructive and reflux uropathy, unspecified: Secondary | ICD-10-CM | POA: Diagnosis not present

## 2013-10-12 ENCOUNTER — Telehealth: Payer: Self-pay | Admitting: Neurology

## 2013-10-12 NOTE — Telephone Encounter (Signed)
Patient has a appointment with Dr. Leonie Man the 33

## 2013-10-12 NOTE — Telephone Encounter (Signed)
Patient calling to state that he wants the appointment on Monday and not on Wednesday since he's off on Monday, please return call and advise.

## 2013-10-16 ENCOUNTER — Other Ambulatory Visit (INDEPENDENT_AMBULATORY_CARE_PROVIDER_SITE_OTHER): Payer: Medicare Other

## 2013-10-16 ENCOUNTER — Encounter: Payer: Self-pay | Admitting: Internal Medicine

## 2013-10-16 ENCOUNTER — Ambulatory Visit (INDEPENDENT_AMBULATORY_CARE_PROVIDER_SITE_OTHER): Payer: Medicare Other | Admitting: Internal Medicine

## 2013-10-16 VITALS — BP 168/80 | HR 80 | Temp 97.6°F | Resp 16 | Ht 70.0 in | Wt 202.0 lb

## 2013-10-16 DIAGNOSIS — R972 Elevated prostate specific antigen [PSA]: Secondary | ICD-10-CM | POA: Diagnosis not present

## 2013-10-16 DIAGNOSIS — R51 Headache: Secondary | ICD-10-CM

## 2013-10-16 DIAGNOSIS — N138 Other obstructive and reflux uropathy: Secondary | ICD-10-CM | POA: Diagnosis not present

## 2013-10-16 DIAGNOSIS — Z0000 Encounter for general adult medical examination without abnormal findings: Secondary | ICD-10-CM | POA: Diagnosis not present

## 2013-10-16 DIAGNOSIS — I10 Essential (primary) hypertension: Secondary | ICD-10-CM | POA: Diagnosis not present

## 2013-10-16 DIAGNOSIS — I6340 Cerebral infarction due to embolism of unspecified cerebral artery: Secondary | ICD-10-CM

## 2013-10-16 DIAGNOSIS — I2510 Atherosclerotic heart disease of native coronary artery without angina pectoris: Secondary | ICD-10-CM

## 2013-10-16 DIAGNOSIS — E785 Hyperlipidemia, unspecified: Secondary | ICD-10-CM

## 2013-10-16 DIAGNOSIS — E119 Type 2 diabetes mellitus without complications: Secondary | ICD-10-CM

## 2013-10-16 DIAGNOSIS — E538 Deficiency of other specified B group vitamins: Secondary | ICD-10-CM

## 2013-10-16 DIAGNOSIS — N401 Enlarged prostate with lower urinary tract symptoms: Secondary | ICD-10-CM | POA: Diagnosis not present

## 2013-10-16 DIAGNOSIS — N529 Male erectile dysfunction, unspecified: Secondary | ICD-10-CM | POA: Diagnosis not present

## 2013-10-16 DIAGNOSIS — R519 Headache, unspecified: Secondary | ICD-10-CM | POA: Insufficient documentation

## 2013-10-16 LAB — HEPATIC FUNCTION PANEL
ALT: 30 U/L (ref 0–53)
AST: 22 U/L (ref 0–37)
Albumin: 3.9 g/dL (ref 3.5–5.2)
Alkaline Phosphatase: 63 U/L (ref 39–117)
BILIRUBIN DIRECT: 0.1 mg/dL (ref 0.0–0.3)
Total Bilirubin: 0.8 mg/dL (ref 0.2–1.2)
Total Protein: 6.5 g/dL (ref 6.0–8.3)

## 2013-10-16 LAB — LIPID PANEL
CHOL/HDL RATIO: 4
Cholesterol: 122 mg/dL (ref 0–200)
HDL: 32.7 mg/dL — ABNORMAL LOW (ref >39.00)
LDL Cholesterol: 72 mg/dL (ref 0–99)
NONHDL: 89.3
TRIGLYCERIDES: 85 mg/dL (ref 0.0–149.0)
VLDL: 17 mg/dL (ref 0.0–40.0)

## 2013-10-16 LAB — CBC WITH DIFFERENTIAL/PLATELET
Basophils Absolute: 0 10*3/uL (ref 0.0–0.1)
Basophils Relative: 0.5 % (ref 0.0–3.0)
Eosinophils Absolute: 0.2 10*3/uL (ref 0.0–0.7)
Eosinophils Relative: 3.6 % (ref 0.0–5.0)
HEMATOCRIT: 38.5 % — AB (ref 39.0–52.0)
Hemoglobin: 12.9 g/dL — ABNORMAL LOW (ref 13.0–17.0)
Lymphocytes Relative: 24.8 % (ref 12.0–46.0)
Lymphs Abs: 1.1 10*3/uL (ref 0.7–4.0)
MCHC: 33.4 g/dL (ref 30.0–36.0)
MCV: 88.6 fl (ref 78.0–100.0)
MONO ABS: 0.4 10*3/uL (ref 0.1–1.0)
Monocytes Relative: 8.7 % (ref 3.0–12.0)
NEUTROS PCT: 62.4 % (ref 43.0–77.0)
Neutro Abs: 2.8 10*3/uL (ref 1.4–7.7)
Platelets: 189 10*3/uL (ref 150.0–400.0)
RBC: 4.35 Mil/uL (ref 4.22–5.81)
RDW: 13.5 % (ref 11.5–15.5)
WBC: 4.6 10*3/uL (ref 4.0–10.5)

## 2013-10-16 LAB — BASIC METABOLIC PANEL
BUN: 13 mg/dL (ref 6–23)
CHLORIDE: 107 meq/L (ref 96–112)
CO2: 25 mEq/L (ref 19–32)
CREATININE: 1 mg/dL (ref 0.4–1.5)
Calcium: 9 mg/dL (ref 8.4–10.5)
GFR: 77.38 mL/min (ref >60.00)
Glucose, Bld: 186 mg/dL — ABNORMAL HIGH (ref 70–99)
POTASSIUM: 4.3 meq/L (ref 3.5–5.1)
SODIUM: 142 meq/L (ref 135–145)

## 2013-10-16 LAB — SEDIMENTATION RATE: Sed Rate: 9 mm/hr (ref 0–22)

## 2013-10-16 LAB — VITAMIN B12: VITAMIN B 12: 618 pg/mL (ref 211–911)

## 2013-10-16 MED ORDER — ACYCLOVIR 200 MG PO CAPS: 200.0000 mg | ORAL_CAPSULE | Freq: Two times a day (BID) | ORAL | Status: DC

## 2013-10-16 MED ORDER — LOSARTAN POTASSIUM 50 MG PO TABS: 50.0000 mg | ORAL_TABLET | Freq: Every day | ORAL | Status: DC

## 2013-10-16 NOTE — Progress Notes (Signed)
Pre visit review using our clinic review tool, if applicable. No additional management support is needed unless otherwise documented below in the visit note. 

## 2013-10-16 NOTE — Assessment & Plan Note (Addendum)
?  migraine Coinsided w/5-FU cream use 10/03/13 MRA HEAD FINDINGS  Small caliber right vertebral artery ends in a posterior inferior  cerebellar artery distribution. Atherosclerotic type changes right  vertebral artery with moderate narrowing.  No significant stenosis left vertebral artery.  Nonvisualized left posterior inferior cerebellar artery with large  left anterior inferior cerebellar artery.  No high-grade stenosis of the basilar artery.  Nonvisualized right anterior inferior cerebellar artery.  Small irregular narrowed left superior cerebellar artery.  Fetal type contribution to the posterior cerebral artery  bilaterally.  Posterior cerebral artery distal branch vessel irregularity and  narrowing bilaterally.  Ectatic distal vertical cervical segment of the right internal  carotid artery. Similar caliber of the internal carotid arteries.  Atherosclerotic type changes with mild ectasia right internal  carotid artery cavernous segment without saccular aneurysm detected.  Middle cerebral artery mild branch vessel irregularity bilaterally.  IMPRESSION:  MRI HEAD  No acute infarct.  No intracranial hemorrhage.  Mild small vessel disease type changes.  MRA HEAD  Intracranial atherosclerotic type changes have changed minimally  since prior exam as detailed above.  Electronically Signed  By: Chauncey Cruel M.D.  On: 10/03/2013 14:16  Pt is using Tylenol prn

## 2013-10-16 NOTE — Progress Notes (Signed)
[name]: Andrew Mechanic., male   DOB: [birth year], 73 y.o.   MRN: [id number]   Subjective:    [name]: Andrew Mechanic., male    DOB: 19-Apr-1940, 73 y.o.   MRN: [id number]  HPI  The patient is here for a wellness exam. The patient has been doing well overall without major physical or psychological issues going on lately. The patient needs to address  chronic hypertension that has been well controlled with medicines; to address chronic  hyperlipidemia controlled with medicines as well; and to address type 2 chronic diabetes, controlled with medical treatment and diet.  He had a bad HA on 10/03/13  -- ER   Subjective:   [name]: Andrew Mechanic., male DOB: Jul 29, 1940, 73 y.o. MRN: [id number]     PMH, FamHx and SocHx reviewed for any changes and relevance.   Current Outpatient Prescriptions on File Prior to Visit   Medication  Sig  Dispense  Refill   .  acyclovir (ZOVIRAX) 200 MG capsule  Take 200 mg by mouth 3 (three) times daily as needed. For herpes outbreaks     .  acyclovir (ZOVIRAX) 200 MG capsule  TAKE ONE CAPSULE THREE TIMES A DAY AS NEEDED  90 capsule  3   .  ammonium lactate (LAC-HYDRIN) 12 % lotion  Apply 1 application topically 2 (two) times daily.     Marland Kitchen  aspirin EC 81 MG tablet  Take 81 mg by mouth daily.     Marland Kitchen  atorvastatin (LIPITOR) 20 MG tablet  take 1 tablet by mouth once daily  30 tablet  6   .  citalopram (CELEXA) 10 MG tablet  Take 1 tablet (10 mg total) by mouth daily.  90 tablet  3   .  clonazePAM (KLONOPIN) 0.5 MG tablet  TAKE 1 TABLET EVERY 8 HOURS AS NEEDED  90 tablet  5   .  clopidogrel (PLAVIX) 75 MG tablet  take 1 tablet by mouth once daily  30 tablet  5   .  glimepiride (AMARYL) 1 MG tablet  Take 1 tablet (1 mg total) by mouth daily before breakfast.  90 tablet  3   .  glucose blood (ONE TOUCH TEST STRIPS) test strip  Use 1 strip to check blood sugar daily. Dx 250.00  50 each  3   .  hydrochlorothiazide (HYDRODIURIL) 25 MG tablet  take 1 tablet by  mouth once daily  90 tablet  3   .  HYDROcodone-homatropine (HYCODAN) 5-1.5 MG/5ML syrup  Take 5 mLs by mouth every 6 (six) hours as needed for cough.  120 mL  1   .  ONE TOUCH LANCETS MISC  Use daily to get blood sugar. Dx 250.00  30 each  3   .  pantoprazole (PROTONIX) 40 MG tablet  take 1 tablet by mouth once daily  30 tablet  5   .  predniSONE (DELTASONE) 10 MG tablet  3 tabs by mouth per day for 3 days,2tabs per day for 3 days,1tab per day for 3 days  18 tablet  0   .  PREVIDENT 5000 BOOSTER PLUS 1.1 % PSTE      .  Tamsulosin HCl (FLOMAX) 0.4 MG CAPS  Take 1 capsule by mouth at bedtime.     Marland Kitchen  atorvastatin (LIPITOR) 20 MG tablet  Take 20 mg by mouth daily.      BP Readings from Last 3 Encounters:  10/16/13 168/80  10/03/13 129/59  08/21/13  121/65   Wt Readings from Last 3 Encounters:  10/16/13 202 lb (91.627 kg)  10/03/13 200 lb (90.719 kg)  08/21/13 220 lb (99.791 kg)       Review of Systems  Constitutional: Negative for appetite change, fatigue and unexpected weight change.  HENT: Negative for congestion, nosebleeds, sneezing, sore throat and trouble swallowing.   Eyes: Negative for itching and visual disturbance.  Respiratory: Negative for cough.   Cardiovascular: Negative for chest pain, palpitations and leg swelling.  Gastrointestinal: Negative for nausea, diarrhea, blood in stool and abdominal distention.  Genitourinary: Negative for frequency and hematuria.  Musculoskeletal: Positive for arthralgias. Negative for back pain, gait problem, joint swelling and neck pain.  Skin: Negative for rash.  Neurological: Negative for dizziness, tremors, speech difficulty and weakness.  Psychiatric/Behavioral: Negative for sleep disturbance, dysphoric mood and agitation. The patient is not nervous/anxious.        Objective:   Physical Exam  Constitutional: He is oriented to person, place, and time. He appears well-developed. No distress.  NAD  HENT:  Mouth/Throat: Oropharynx  is clear and moist.  Eyes: Conjunctivae are normal. Pupils are equal, round, and reactive to light.  Neck: Normal range of motion. No JVD present. No thyromegaly present.  Cardiovascular: Normal rate, regular rhythm, normal heart sounds and intact distal pulses.  Exam reveals no gallop and no friction rub.   No murmur heard. Pulmonary/Chest: Effort normal and breath sounds normal. No respiratory distress. He has no wheezes. He has no rales. He exhibits no tenderness.  Abdominal: Soft. Bowel sounds are normal. He exhibits no distension and no mass. There is no tenderness. There is no rebound and no guarding.  Musculoskeletal: Normal range of motion. He exhibits no edema and no tenderness.  Lymphadenopathy:    He has no cervical adenopathy.  Neurological: He is alert and oriented to person, place, and time. He has normal reflexes. No cranial nerve deficit. He exhibits normal muscle tone. He displays a negative Romberg sign. Coordination and gait normal.  No meningeal signs  Skin: Skin is warm and dry. No rash noted.  Psychiatric: He has a normal mood and affect. His behavior is normal. Judgment and thought content normal.    Lab Results  Component Value Date   WBC 5.9 10/03/2013   HGB 13.9 10/03/2013   HCT 41.6 10/03/2013   PLT 182 10/03/2013   GLUCOSE 115* 10/03/2013   CHOL 141 06/27/2012   TRIG 230.0* 06/27/2012   HDL 31.70* 06/27/2012   LDLDIRECT 89.5 06/27/2012   LDLCALC 77 03/17/2012   ALT 27 10/24/2012   AST 19 10/24/2012   NA 141 10/03/2013   K 4.8 10/03/2013   CL 104 10/03/2013   CREATININE 0.98 10/03/2013   BUN 23 10/03/2013   CO2 27 10/03/2013   TSH 1.08 10/24/2012   PSA 0.66 09/06/2007   INR 0.95 08/05/2011   HGBA1C 6.3 10/24/2012         Assessment & Plan:

## 2013-10-16 NOTE — Assessment & Plan Note (Signed)

## 2013-10-16 NOTE — Assessment & Plan Note (Signed)
Labs

## 2013-10-16 NOTE — Assessment & Plan Note (Signed)
Continue with current prescription therapy as reflected on the Med list.  

## 2013-10-16 NOTE — Assessment & Plan Note (Addendum)
Will adjust meds BP OK at home per pt  BP Readings from Last 3 Encounters:  10/16/13 168/80  10/03/13 129/59  08/21/13 121/65

## 2013-10-16 NOTE — Patient Instructions (Signed)

## 2013-10-16 NOTE — Assessment & Plan Note (Signed)
Remote Continue with current prescription therapy as reflected on the Med list.

## 2013-10-17 ENCOUNTER — Ambulatory Visit: Payer: Self-pay | Admitting: Neurology

## 2013-10-18 ENCOUNTER — Ambulatory Visit: Payer: Self-pay | Admitting: Neurology

## 2013-10-19 ENCOUNTER — Telehealth (HOSPITAL_COMMUNITY): Payer: Self-pay | Admitting: Interventional Radiology

## 2013-10-19 NOTE — Telephone Encounter (Signed)
Called pt, spoke to his wife. Told her that Andrew Malone would need another f/u US carotids in 6 month's time. She states understanding and is in agreement w/ this plan of care. JM

## 2013-10-30 ENCOUNTER — Encounter: Payer: Self-pay | Admitting: Neurology

## 2013-10-30 ENCOUNTER — Ambulatory Visit (INDEPENDENT_AMBULATORY_CARE_PROVIDER_SITE_OTHER): Payer: Medicare Other | Admitting: Neurology

## 2013-10-30 VITALS — BP 144/63 | HR 63 | Ht 70.0 in | Wt 200.0 lb

## 2013-10-30 DIAGNOSIS — I2510 Atherosclerotic heart disease of native coronary artery without angina pectoris: Secondary | ICD-10-CM

## 2013-10-30 DIAGNOSIS — R51 Headache: Secondary | ICD-10-CM | POA: Diagnosis not present
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
## 2013-10-30 NOTE — Patient Instructions (Signed)
I had a long discussion with the patient and his wife regarding his episode of generalized headache and weakness with normal brain imaging findings likely related to toxicity from ammonia topical cream.Recommend he see his dermatologist Dr. Pablo Ledger to discuss correct dosage and usage of this cream.. No further diagnostic testing or treatment for headaches is necessary at this point. Continue aspirin and Plavix for stroke prevention with strict control of hypertension with blood pressure goal below 130/90, lipids with LDL cholesterol goal below 100 mg percent and use CPAP every night for his sleep apnea. Follow up with Charlott Holler, NP as scheduled previously. No followup is necessary with me.

## 2013-11-20 ENCOUNTER — Other Ambulatory Visit: Payer: Self-pay

## 2013-11-20 MED ORDER — ATORVASTATIN CALCIUM 20 MG PO TABS: 20.0000 mg | ORAL_TABLET | Freq: Every day | ORAL | Status: DC

## 2013-12-11 ENCOUNTER — Telehealth: Payer: Self-pay | Admitting: *Deleted

## 2013-12-11 MED ORDER — CLONAZEPAM 0.5 MG PO TABS: 0.5000 mg | ORAL_TABLET | Freq: Three times a day (TID) | ORAL | Status: DC

## 2013-12-11 NOTE — Telephone Encounter (Signed)
Rf req for Clonazepam 0.5 mg 1 po q 8 hrs. Last filled 10/10/13. Ok to Rf?

## 2013-12-11 NOTE — Telephone Encounter (Signed)
OK to fill this prescription with additional refills x2 Thank you!  

## 2013-12-11 NOTE — Telephone Encounter (Signed)
Done

## 2013-12-18 ENCOUNTER — Ambulatory Visit (INDEPENDENT_AMBULATORY_CARE_PROVIDER_SITE_OTHER): Payer: Medicare Other | Admitting: Cardiology

## 2013-12-18 ENCOUNTER — Encounter: Payer: Self-pay | Admitting: Cardiology

## 2013-12-18 VITALS — BP 126/62 | HR 58 | Ht 70.0 in | Wt 203.3 lb

## 2013-12-18 DIAGNOSIS — I6350 Cerebral infarction due to unspecified occlusion or stenosis of unspecified cerebral artery: Secondary | ICD-10-CM

## 2013-12-18 DIAGNOSIS — I10 Essential (primary) hypertension: Secondary | ICD-10-CM | POA: Diagnosis not present

## 2013-12-18 DIAGNOSIS — I639 Cerebral infarction, unspecified: Secondary | ICD-10-CM

## 2013-12-18 DIAGNOSIS — I2510 Atherosclerotic heart disease of native coronary artery without angina pectoris: Secondary | ICD-10-CM | POA: Diagnosis not present

## 2013-12-18 DIAGNOSIS — I350 Nonrheumatic aortic (valve) stenosis: Secondary | ICD-10-CM

## 2013-12-18 NOTE — Patient Instructions (Signed)
Your physician wants you to follow-up in: ONE YEAR WITH DR CRENSHAW You will receive a reminder letter in the mail two months in advance. If you don't receive a letter, please call our office to schedule the follow-up appointment.   Your physician has requested that you have an exercise tolerance test. For further information please visit [email or web address]. Please also follow instruction sheet, as given.    Exercise Stress Electrocardiogram  An exercise stress electrocardiogram is a test to check how blood flows to your heart. It is done to find areas of poor blood flow. You will need to walk on a treadmill for this test. The electrocardiogram will record your heartbeat when you are at rest and when you are exercising. BEFORE THE PROCEDURE  Do not have drinks with caffeine or foods with caffeine for 24 hours before the test, or as told by your doctor. This includes coffee, tea (even decaf tea), sodas, chocolate, and cocoa.  Follow your doctor's instructions about eating and drinking before the test.  Ask your doctor what medicines you should or should not take before the test. Take your medicines with water unless told by your doctor not to.  If you use an inhaler, bring it with you to the test.  Bring a snack to eat after the test.  Do not  smoke for 4 hours before the test.  Do not put lotions, powders, creams, or oils on your chest before the test.  Wear comfortable shoes and clothing. PROCEDURE  You will have patches put on your chest. Small areas of your chest may need to be shaved. Wires will be connected to the patches.  Your heart rate will be watched while you are resting and while you are exercising.  You will walk on the treadmill. The treadmill will slowly get faster to raise your heart rate.  The test will take about 1-2 hours. AFTER THE PROCEDURE  Your heart rate and blood pressure will be watched after the test.  You may return to your normal diet, activities,  and medicines or as told by your doctor. Document Released: 08/05/2007 Document Revised: 07/03/2013 Document Reviewed: 10/24/2012 ExitCare Patient Information 2015 [employer]. This information is not intended to replace advice given to you by your health care provider. Make sure you discuss any questions you have with your health care provider.  

## 2013-12-18 NOTE — Assessment & Plan Note (Signed)
History of carotid stent. Continue aspirin and statin. Followed by interventional radiology.

## 2013-12-18 NOTE — Assessment & Plan Note (Signed)
Does not sound severe on exam. We will plan to repeat his echocardiogram when he returns in one year.

## 2013-12-18 NOTE — Assessment & Plan Note (Signed)
Continue statin. 

## 2013-12-18 NOTE — Assessment & Plan Note (Signed)
Blood pressure controlled. Continue present medications. 

## 2013-12-18 NOTE — Assessment & Plan Note (Signed)
He has occasional vague chest pain that is not exertional. Schedule exercise treadmill. Continue aspirin and statin.
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
## 2014-01-02 ENCOUNTER — Other Ambulatory Visit: Payer: Self-pay | Admitting: Internal Medicine

## 2014-01-04 ENCOUNTER — Encounter: Payer: Self-pay | Admitting: *Deleted

## 2014-01-08 ENCOUNTER — Ambulatory Visit (INDEPENDENT_AMBULATORY_CARE_PROVIDER_SITE_OTHER): Payer: Medicare Other | Admitting: Physician Assistant

## 2014-01-08 DIAGNOSIS — I2510 Atherosclerotic heart disease of native coronary artery without angina pectoris: Secondary | ICD-10-CM | POA: Diagnosis not present

## 2014-01-08 NOTE — Addendum Note (Signed)
Addended byKathlen Mody, Nicki Reaper T on: 01/08/2014 10:01 AM   Modules accepted: Level of Service

## 2014-01-08 NOTE — Progress Notes (Signed)
Exercise Treadmill Test  Pre-Exercise Testing Evaluation Rhythm: normal sinus  Rate: 76     Test  Exercise Tolerance Test Ordering MD: Kirk Ruths, MD  Interpreting MD: Richardson Dopp, PA-C  Unique Test No: 1  Treadmill:  1  Indication for ETT: chest pain - rule out ischemia  Contraindication to ETT: No   Stress Modality: exercise - treadmill  Cardiac Imaging Performed: non   Protocol: standard Bruce - maximal  Max BP:  211/69  Max MPHR (bpm):  149 85% MPR (bpm):  127  MPHR obtained (bpm):  136 % MPHR obtained:  91  Reached 85% MPHR (min:sec):  4:23 Total Exercise Time (min-sec):  6:00  Workload in METS:  7.0 Borg Scale: 13  Reason ETT Terminated:  desired heart rate attained    ST Segment Analysis At Rest: normal ST segments - no evidence of significant ST depression With Exercise: no evidence of significant ST depression  Other Information Arrhythmia:  No Angina during ETT:  present (1) Quality of ETT:  diagnostic  ETT Interpretation:  normal - no evidence of ischemia by ST analysis  Comments: Fair exercise capacity. Patient complained of chest pain at peak exercise.  This resolved in recovery.  Hypertensive BP response to exercise. No ST changes to suggest ischemia.   Recommendations: Clinically Positive. Electrically Negative. FU with Dr. Kirk Ruths as planned. Signed,  Richardson Dopp, PA-C   01/08/2014 9:58 AM

## 2014-01-11 ENCOUNTER — Telehealth: Payer: Self-pay | Admitting: Cardiology

## 2014-01-11 NOTE — Telephone Encounter (Signed)
New message  Pt wife called states that the patient is waiting on a call back to discuss previous stress test. Please call back to discuss//sr

## 2014-01-11 NOTE — Telephone Encounter (Signed)
Spoke with pt, aware GXT was normal

## 2014-01-21 ENCOUNTER — Other Ambulatory Visit: Payer: Self-pay | Admitting: Internal Medicine

## 2014-02-15 ENCOUNTER — Encounter: Payer: Self-pay | Admitting: Internal Medicine

## 2014-02-15 ENCOUNTER — Ambulatory Visit (INDEPENDENT_AMBULATORY_CARE_PROVIDER_SITE_OTHER): Payer: Medicare Other | Admitting: Internal Medicine

## 2014-02-15 VITALS — BP 130/80 | HR 70 | Temp 98.3°F | Wt 203.0 lb

## 2014-02-15 DIAGNOSIS — E119 Type 2 diabetes mellitus without complications: Secondary | ICD-10-CM | POA: Diagnosis not present

## 2014-02-15 DIAGNOSIS — J309 Allergic rhinitis, unspecified: Secondary | ICD-10-CM | POA: Insufficient documentation

## 2014-02-15 DIAGNOSIS — I6340 Cerebral infarction due to embolism of unspecified cerebral artery: Secondary | ICD-10-CM

## 2014-02-15 DIAGNOSIS — Z23 Encounter for immunization: Secondary | ICD-10-CM

## 2014-02-15 DIAGNOSIS — I2510 Atherosclerotic heart disease of native coronary artery without angina pectoris: Secondary | ICD-10-CM

## 2014-02-15 DIAGNOSIS — G4733 Obstructive sleep apnea (adult) (pediatric): Secondary | ICD-10-CM | POA: Diagnosis not present

## 2014-02-15 MED ORDER — LORATADINE 10 MG PO TABS: 10.0000 mg | ORAL_TABLET | Freq: Every day | ORAL | Status: DC

## 2014-02-15 MED ORDER — FLUTICASONE PROPIONATE 50 MCG/ACT NA SUSP
2.0000 | Freq: Every day | NASAL | Status: DC
Start: 2014-02-15 — End: 2015-04-22

## 2014-02-15 NOTE — Assessment & Plan Note (Signed)
New mask Flonase Loratidine Netty pot

## 2014-02-15 NOTE — Progress Notes (Signed)
Pre visit review using our clinic review tool, if applicable. No additional management support is needed unless otherwise documented below in the visit note. 

## 2014-02-15 NOTE — Assessment & Plan Note (Signed)
GXT nl 11/15

## 2014-02-15 NOTE — Assessment & Plan Note (Signed)
GXT nl 11/15 Continue with current prescription therapy as reflected on the Med list.

## 2014-02-15 NOTE — Assessment & Plan Note (Signed)
Continue with current prescription therapy as reflected on the Med list.  

## 2014-02-15 NOTE — Assessment & Plan Note (Signed)
  On diet  

## 2014-02-15 NOTE — Progress Notes (Signed)
Subjective:    HPI  C/o nasal congestion x 2 mo - has to get up at night The patient needs to address  chronic hypertension that has been well controlled with medicines; to address chronic  hyperlipidemia controlled with medicines as well; and to address type 2 chronic diabetes, controlled with medical treatment and diet.  On CPAP   Subjective:   [name]: Andrew Malone., male DOB: [birth year], 73 y.o. MRN: [id number]     PMH, FamHx and SocHx reviewed for any changes and relevance.   Current Outpatient Prescriptions on File Prior to Visit   Medication  Sig  Dispense  Refill   .  acyclovir (ZOVIRAX) 200 MG capsule  Take 200 mg by mouth 3 (three) times daily as needed. For herpes outbreaks     .  acyclovir (ZOVIRAX) 200 MG capsule  TAKE ONE CAPSULE THREE TIMES A DAY AS NEEDED  90 capsule  3   .  ammonium lactate (LAC-HYDRIN) 12 % lotion  Apply 1 application topically 2 (two) times daily.     Marland Kitchen  aspirin EC 81 MG tablet  Take 81 mg by mouth daily.     Marland Kitchen  atorvastatin (LIPITOR) 20 MG tablet  take 1 tablet by mouth once daily  30 tablet  6   .  citalopram (CELEXA) 10 MG tablet  Take 1 tablet (10 mg total) by mouth daily.  90 tablet  3   .  clonazePAM (KLONOPIN) 0.5 MG tablet  TAKE 1 TABLET EVERY 8 HOURS AS NEEDED  90 tablet  5   .  clopidogrel (PLAVIX) 75 MG tablet  take 1 tablet by mouth once daily  30 tablet  5   .  glimepiride (AMARYL) 1 MG tablet  Take 1 tablet (1 mg total) by mouth daily before breakfast.  90 tablet  3   .  glucose blood (ONE TOUCH TEST STRIPS) test strip  Use 1 strip to check blood sugar daily. Dx 250.00  50 each  3   .  hydrochlorothiazide (HYDRODIURIL) 25 MG tablet  take 1 tablet by mouth once daily  90 tablet  3   .  HYDROcodone-homatropine (HYCODAN) 5-1.5 MG/5ML syrup  Take 5 mLs by mouth every 6 (six) hours as needed for cough.  120 mL  1   .  ONE TOUCH LANCETS MISC  Use daily to get blood sugar. Dx 250.00  30 each  3   .  pantoprazole (PROTONIX) 40 MG  tablet  take 1 tablet by mouth once daily  30 tablet  5   .  predniSONE (DELTASONE) 10 MG tablet  3 tabs by mouth per day for 3 days,2tabs per day for 3 days,1tab per day for 3 days  18 tablet  0   .  PREVIDENT 5000 BOOSTER PLUS 1.1 % PSTE      .  Tamsulosin HCl (FLOMAX) 0.4 MG CAPS  Take 1 capsule by mouth at bedtime.     Marland Kitchen  atorvastatin (LIPITOR) 20 MG tablet  Take 20 mg by mouth daily.      BP Readings from Last 3 Encounters:  02/15/14 180/90  12/18/13 126/62  10/30/13 144/63   Wt Readings from Last 3 Encounters:  02/15/14 203 lb (92.08 kg)  12/18/13 203 lb 4.8 oz (92.216 kg)  10/30/13 200 lb (90.719 kg)    130/80 rechecked   Review of Systems  Constitutional: Negative for appetite change, fatigue and unexpected weight change.  HENT: Negative for congestion, nosebleeds,  sneezing, sore throat and trouble swallowing.   Eyes: Negative for itching and visual disturbance.  Respiratory: Negative for cough.   Cardiovascular: Negative for chest pain, palpitations and leg swelling.  Gastrointestinal: Negative for nausea, diarrhea, blood in stool and abdominal distention.  Genitourinary: Negative for frequency and hematuria.  Musculoskeletal: Positive for arthralgias. Negative for back pain, joint swelling, gait problem and neck pain.  Skin: Negative for rash.  Neurological: Negative for dizziness, tremors, speech difficulty and weakness.  Psychiatric/Behavioral: Negative for sleep disturbance, dysphoric mood and agitation. The patient is not nervous/anxious.        Objective:   Physical Exam  Constitutional: He is oriented to person, place, and time. He appears well-developed. No distress.  NAD  HENT:  Mouth/Throat: Oropharynx is clear and moist.  Eyes: Conjunctivae are normal. Pupils are equal, round, and reactive to light.  Neck: Normal range of motion. No JVD present. No thyromegaly present.  Cardiovascular: Normal rate, regular rhythm, normal heart sounds and intact distal  pulses.  Exam reveals no gallop and no friction rub.   No murmur heard. Pulmonary/Chest: Effort normal and breath sounds normal. No respiratory distress. He has no wheezes. He has no rales. He exhibits no tenderness.  Abdominal: Soft. Bowel sounds are normal. He exhibits no distension and no mass. There is no tenderness. There is no rebound and no guarding.  Musculoskeletal: Normal range of motion. He exhibits no edema or tenderness.  Lymphadenopathy:    He has no cervical adenopathy.  Neurological: He is alert and oriented to person, place, and time. He has normal reflexes. No cranial nerve deficit. He exhibits normal muscle tone. He displays a negative Romberg sign. Coordination and gait normal.  No meningeal signs  Skin: Skin is warm and dry. No rash noted.  Psychiatric: He has a normal mood and affect. His behavior is normal. Judgment and thought content normal.    Lab Results  Component Value Date   WBC 4.6 10/16/2013   HGB 12.9* 10/16/2013   HCT 38.5* 10/16/2013   PLT 189.0 10/16/2013   GLUCOSE 186* 10/16/2013   CHOL 122 10/16/2013   TRIG 85.0 10/16/2013   HDL 32.70* 10/16/2013   LDLDIRECT 89.5 06/27/2012   LDLCALC 72 10/16/2013   ALT 30 10/16/2013   AST 22 10/16/2013   NA 142 10/16/2013   K 4.3 10/16/2013   CL 107 10/16/2013   CREATININE 1.0 10/16/2013   BUN 13 10/16/2013   CO2 25 10/16/2013   TSH 1.08 10/24/2012   PSA 0.66 09/06/2007   INR 0.95 08/05/2011   HGBA1C 6.3 10/24/2012    GXT was nl     Assessment & Plan:

## 2014-02-15 NOTE — Patient Instructions (Signed)
Netty pot Hold Lipitor x 2 weeks

## 2014-02-15 NOTE — Assessment & Plan Note (Signed)
On CPAP. ?

## 2014-02-20 ENCOUNTER — Other Ambulatory Visit: Payer: Self-pay | Admitting: Internal Medicine

## 2014-02-26 ENCOUNTER — Ambulatory Visit: Payer: Medicare Other | Admitting: Neurology

## 2014-02-27 ENCOUNTER — Other Ambulatory Visit: Payer: Self-pay | Admitting: Internal Medicine

## 2014-03-05 DIAGNOSIS — E1142 Type 2 diabetes mellitus with diabetic polyneuropathy: Secondary | ICD-10-CM | POA: Diagnosis not present

## 2014-03-07 ENCOUNTER — Ambulatory Visit (INDEPENDENT_AMBULATORY_CARE_PROVIDER_SITE_OTHER): Payer: Medicare Other | Admitting: Neurology

## 2014-03-07 ENCOUNTER — Encounter: Payer: Self-pay | Admitting: Neurology

## 2014-03-07 VITALS — BP 113/68 | HR 84 | Ht 70.5 in | Wt 202.2 lb

## 2014-03-07 DIAGNOSIS — I6529 Occlusion and stenosis of unspecified carotid artery: Secondary | ICD-10-CM | POA: Diagnosis not present
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
## 2014-03-07 NOTE — Patient Instructions (Signed)
I had a long discussion with the patient with regards to his stroke risk factors and risk for recurrent stroke and second stroke prevention. I recommend he discontinue aspirin and stay on Plavix alone for stroke prevention as he is bruising easily.. Maintain strict control of hypertension with blood pressure goal below 130/90 and lipids with LDL cholesterol goal below 100 mg percent. Continue follow-up for carotid stenosis with Dr.[name] and 6 monthly f/u Doppler studies. Return for follow-up in a year or call earlier if necessary.

## 2014-03-12 ENCOUNTER — Ambulatory Visit (HOSPITAL_COMMUNITY)
Admission: RE | Admit: 2014-03-12 | Discharge: 2014-03-12 | Disposition: A | Discharge status: Home / Self Care | Payer: Medicare Other | Source: Ambulatory Visit | Attending: Cardiology | Admitting: Cardiology

## 2014-03-12 DIAGNOSIS — Z95828 Presence of other vascular implants and grafts: Secondary | ICD-10-CM | POA: Insufficient documentation

## 2014-03-12 DIAGNOSIS — I6523 Occlusion and stenosis of bilateral carotid arteries: Secondary | ICD-10-CM | POA: Insufficient documentation

## 2014-03-12 DIAGNOSIS — I6529 Occlusion and stenosis of unspecified carotid artery: Secondary | ICD-10-CM

## 2014-03-12 DIAGNOSIS — I739 Peripheral vascular disease, unspecified: Secondary | ICD-10-CM

## 2014-03-12 NOTE — Progress Notes (Signed)
VASCULAR LAB PRELIMINARY  PRELIMINARY  PRELIMINARY  PRELIMINARY  Carotid Dopplers completed.    Preliminary report:  1-39% ICA stenosis.  Vertebral artery flow is antegrade. Stent appears patent.  Consuelo Thayne, RVT 03/12/2014, 9:26 AM

## 2014-03-16 ENCOUNTER — Telehealth (HOSPITAL_COMMUNITY): Payer: Self-pay | Admitting: Interventional Radiology

## 2014-03-16 NOTE — Telephone Encounter (Signed)
Called and spoke to pt's wife. Told her that per [facility] [name] would be due for a f/u US in 6 months time. She states understanding and is in agreement with this plan. Pt's wife wanted me to also ask Dr. Estanislado Pandy if he felt like her husband should stop his Aspirin 81mg  1 QD as this was recommended by Dr. Leonie Man. They were not comfortable with this and wanted [name] to let them know what he should do. I told her that I would ask him and call her back. She agrees with this plan as well. JM

## 2014-03-21 ENCOUNTER — Other Ambulatory Visit: Payer: Self-pay | Admitting: Radiology

## 2014-03-21 ENCOUNTER — Telehealth (HOSPITAL_COMMUNITY): Payer: Self-pay | Admitting: Interventional Radiology

## 2014-03-21 NOTE — Telephone Encounter (Signed)
Called pt, spoke to his wife. Told her that Dr. Estanislado Pandy would like for Mr. Hedgepath to come by the hospital and have a P2Y12 drawn so that he can decide if the patient needs any medication changes. She states understanding and is in agreement with this plan. They are going to try to stop by today to get this lab work done. I told her to just let me know if they could not make it today and we could get it done as soon as they were available. JM

## 2014-03-28 ENCOUNTER — Other Ambulatory Visit: Payer: Self-pay | Admitting: Internal Medicine

## 2014-04-03 ENCOUNTER — Ambulatory Visit: Payer: Medicare Other | Admitting: Neurology

## 2014-04-23 ENCOUNTER — Ambulatory Visit (INDEPENDENT_AMBULATORY_CARE_PROVIDER_SITE_OTHER)
Admission: RE | Admit: 2014-04-23 | Discharge: 2014-04-23 | Disposition: A | Discharge status: Home / Self Care | Payer: Medicare Other | Source: Ambulatory Visit | Attending: Internal Medicine | Admitting: Internal Medicine

## 2014-04-23 ENCOUNTER — Ambulatory Visit: Payer: PRIVATE HEALTH INSURANCE | Admitting: Internal Medicine

## 2014-04-23 ENCOUNTER — Encounter: Payer: Self-pay | Admitting: Internal Medicine

## 2014-04-23 ENCOUNTER — Ambulatory Visit (INDEPENDENT_AMBULATORY_CARE_PROVIDER_SITE_OTHER): Payer: Medicare Other | Admitting: Internal Medicine

## 2014-04-23 VITALS — BP 152/84 | HR 76 | Temp 98.4°F | Wt 199.0 lb

## 2014-04-23 DIAGNOSIS — I6529 Occlusion and stenosis of unspecified carotid artery: Secondary | ICD-10-CM

## 2014-04-23 DIAGNOSIS — J449 Chronic obstructive pulmonary disease, unspecified: Secondary | ICD-10-CM | POA: Diagnosis not present

## 2014-04-23 DIAGNOSIS — J189 Pneumonia, unspecified organism: Secondary | ICD-10-CM | POA: Diagnosis not present

## 2014-04-23 DIAGNOSIS — R509 Fever, unspecified: Secondary | ICD-10-CM | POA: Diagnosis not present

## 2014-04-23 DIAGNOSIS — R05 Cough: Secondary | ICD-10-CM | POA: Diagnosis not present

## 2014-04-23 DIAGNOSIS — R0602 Shortness of breath: Secondary | ICD-10-CM | POA: Diagnosis not present

## 2014-04-23 DIAGNOSIS — E538 Deficiency of other specified B group vitamins: Secondary | ICD-10-CM

## 2014-04-23 MED ORDER — PROMETHAZINE-CODEINE 6.25-10 MG/5ML PO SYRP: 5.0000 mL | ORAL_SOLUTION | ORAL | Status: DC | PRN

## 2014-04-23 MED ORDER — LEVOFLOXACIN 500 MG PO TABS: 500.0000 mg | ORAL_TABLET | Freq: Every day | ORAL | Status: DC

## 2014-04-23 NOTE — Assessment & Plan Note (Signed)
Continue with current prescription therapy as reflected on the Med list.  

## 2014-04-23 NOTE — Progress Notes (Signed)
Pre visit review using our clinic review tool, if applicable. No additional management support is needed unless otherwise documented below in the visit note. 

## 2014-04-23 NOTE — Assessment & Plan Note (Signed)
CXR Levaquin x 10 d

## 2014-04-23 NOTE — Progress Notes (Signed)
Subjective:    Cough This is a new problem. The current episode started 1 to 4 weeks ago. The problem has been gradually worsening. The problem occurs every few minutes. The cough is productive of purulent sputum. Associated symptoms include chills, ear congestion and nasal congestion. Pertinent negatives include no chest pain, fever, hemoptysis, rash, sore throat or weight loss.    F/u nasal congestion The patient needs to address  chronic hypertension that has been well controlled with medicines; to address chronic  hyperlipidemia controlled with medicines as well; and to address type 2 chronic diabetes, controlled with medical treatment and diet.  On CPAP   Subjective:   [name]: Andrew Geil., male DOB: 20-Nov-1940, 74 y.o. MRN: [id number]     PMH, FamHx and SocHx reviewed for any changes and relevance.   Current Outpatient Prescriptions on File Prior to Visit   Medication  Sig  Dispense  Refill   .  acyclovir (ZOVIRAX) 200 MG capsule  Take 200 mg by mouth 3 (three) times daily as needed. For herpes outbreaks     .  acyclovir (ZOVIRAX) 200 MG capsule  TAKE ONE CAPSULE THREE TIMES A DAY AS NEEDED  90 capsule  3   .  ammonium lactate (LAC-HYDRIN) 12 % lotion  Apply 1 application topically 2 (two) times daily.     Marland Kitchen  aspirin EC 81 MG tablet  Take 81 mg by mouth daily.     Marland Kitchen  atorvastatin (LIPITOR) 20 MG tablet  take 1 tablet by mouth once daily  30 tablet  6   .  citalopram (CELEXA) 10 MG tablet  Take 1 tablet (10 mg total) by mouth daily.  90 tablet  3   .  clonazePAM (KLONOPIN) 0.5 MG tablet  TAKE 1 TABLET EVERY 8 HOURS AS NEEDED  90 tablet  5   .  clopidogrel (PLAVIX) 75 MG tablet  take 1 tablet by mouth once daily  30 tablet  5   .  glimepiride (AMARYL) 1 MG tablet  Take 1 tablet (1 mg total) by mouth daily before breakfast.  90 tablet  3   .  glucose blood (ONE TOUCH TEST STRIPS) test strip  Use 1 strip to check blood sugar daily. Dx 250.00  50 each  3   .   hydrochlorothiazide (HYDRODIURIL) 25 MG tablet  take 1 tablet by mouth once daily  90 tablet  3   .  HYDROcodone-homatropine (HYCODAN) 5-1.5 MG/5ML syrup  Take 5 mLs by mouth every 6 (six) hours as needed for cough.  120 mL  1   .  ONE TOUCH LANCETS MISC  Use daily to get blood sugar. Dx 250.00  30 each  3   .  pantoprazole (PROTONIX) 40 MG tablet  take 1 tablet by mouth once daily  30 tablet  5   .  predniSONE (DELTASONE) 10 MG tablet  3 tabs by mouth per day for 3 days,2tabs per day for 3 days,1tab per day for 3 days  18 tablet  0   .  PREVIDENT 5000 BOOSTER PLUS 1.1 % PSTE      .  Tamsulosin HCl (FLOMAX) 0.4 MG CAPS  Take 1 capsule by mouth at bedtime.     Marland Kitchen  atorvastatin (LIPITOR) 20 MG tablet  Take 20 mg by mouth daily.      BP Readings from Last 3 Encounters:  04/23/14 152/84  03/07/14 113/68  02/15/14 130/80   Wt Readings from Last 3  Encounters:  04/23/14 199 lb (90.266 kg)  03/07/14 202 lb 3.2 oz (91.717 kg)  02/15/14 203 lb (92.08 kg)    130/80 rechecked   Review of Systems  Constitutional: Positive for chills. Negative for fever, weight loss, appetite change, fatigue and unexpected weight change.  HENT: Negative for congestion, nosebleeds, sneezing, sore throat and trouble swallowing.   Eyes: Negative for itching and visual disturbance.  Respiratory: Negative for cough and hemoptysis.   Cardiovascular: Negative for chest pain, palpitations and leg swelling.  Gastrointestinal: Negative for nausea, diarrhea, blood in stool and abdominal distention.  Genitourinary: Negative for frequency and hematuria.  Musculoskeletal: Positive for arthralgias. Negative for back pain, joint swelling, gait problem and neck pain.  Skin: Negative for rash.  Neurological: Negative for dizziness, tremors, speech difficulty and weakness.  Psychiatric/Behavioral: Negative for sleep disturbance, dysphoric mood and agitation. The patient is not nervous/anxious.        Objective:   Physical Exam    Constitutional: He is oriented to person, place, and time. He appears well-developed. No distress.  NAD  HENT:  Mouth/Throat: Oropharynx is clear and moist.  Eyes: Conjunctivae are normal. Pupils are equal, round, and reactive to light.  Neck: Normal range of motion. No JVD present. No thyromegaly present.  Cardiovascular: Normal rate, regular rhythm, normal heart sounds and intact distal pulses.  Exam reveals no gallop and no friction rub.   No murmur heard. Pulmonary/Chest: Effort normal. No respiratory distress. He has wheezes. He has no rales. He exhibits no tenderness.  Abdominal: Soft. Bowel sounds are normal. He exhibits no distension and no mass. There is no tenderness. There is no rebound and no guarding.  Musculoskeletal: Normal range of motion. He exhibits no edema or tenderness.  Lymphadenopathy:    He has no cervical adenopathy.  Neurological: He is alert and oriented to person, place, and time. He has normal reflexes. No cranial nerve deficit. He exhibits normal muscle tone. He displays a negative Romberg sign. Coordination and gait normal.  No meningeal signs  Skin: Skin is warm and dry. No rash noted.  Psychiatric: He has a normal mood and affect. His behavior is normal. Judgment and thought content normal.  Crackles at L base  Lab Results  Component Value Date   WBC 4.6 10/16/2013   HGB 12.9* 10/16/2013   HCT 38.5* 10/16/2013   PLT 189.0 10/16/2013   GLUCOSE 186* 10/16/2013   CHOL 122 10/16/2013   TRIG 85.0 10/16/2013   HDL 32.70* 10/16/2013   LDLDIRECT 89.5 06/27/2012   LDLCALC 72 10/16/2013   ALT 30 10/16/2013   AST 22 10/16/2013   NA 142 10/16/2013   K 4.3 10/16/2013   CL 107 10/16/2013   CREATININE 1.0 10/16/2013   BUN 13 10/16/2013   CO2 25 10/16/2013   TSH 1.08 10/24/2012   PSA 0.66 09/06/2007   INR 0.95 08/05/2011   HGBA1C 6.3 10/24/2012    GXT was nl     Assessment & Plan:

## 2014-05-07 ENCOUNTER — Encounter: Payer: Self-pay | Admitting: Internal Medicine

## 2014-05-07 ENCOUNTER — Ambulatory Visit (INDEPENDENT_AMBULATORY_CARE_PROVIDER_SITE_OTHER): Payer: Medicare Other | Admitting: Internal Medicine

## 2014-05-07 VITALS — BP 150/82 | HR 77 | Temp 98.0°F | Wt 199.0 lb

## 2014-05-07 DIAGNOSIS — J189 Pneumonia, unspecified organism: Secondary | ICD-10-CM

## 2014-05-07 DIAGNOSIS — E119 Type 2 diabetes mellitus without complications: Secondary | ICD-10-CM

## 2014-05-07 DIAGNOSIS — E538 Deficiency of other specified B group vitamins: Secondary | ICD-10-CM | POA: Diagnosis not present

## 2014-05-07 DIAGNOSIS — I10 Essential (primary) hypertension: Secondary | ICD-10-CM | POA: Diagnosis not present

## 2014-05-07 DIAGNOSIS — I6350 Cerebral infarction due to unspecified occlusion or stenosis of unspecified cerebral artery: Secondary | ICD-10-CM

## 2014-05-07 DIAGNOSIS — I639 Cerebral infarction, unspecified: Secondary | ICD-10-CM

## 2014-05-07 MED ORDER — CLONAZEPAM 0.5 MG PO TABS: 0.5000 mg | ORAL_TABLET | Freq: Three times a day (TID) | ORAL | Status: DC

## 2014-05-07 NOTE — Assessment & Plan Note (Signed)
OK to go back to work on 05/10/14

## 2014-05-07 NOTE — Assessment & Plan Note (Signed)
Check BP at home On Losartan

## 2014-05-07 NOTE — Assessment & Plan Note (Signed)
  On diet  

## 2014-05-07 NOTE — Assessment & Plan Note (Signed)
Cont w/Plavix

## 2014-05-07 NOTE — Progress Notes (Signed)
Subjective:    HPI  C/o nasal congestion - mucus. Overall much better. F/u CAP  The patient needs to address  chronic hypertension that has been well controlled with medicines; to address chronic  hyperlipidemia controlled with medicines as well; and to address type 2 chronic diabetes, controlled with medical treatment and diet.  On CPAP   Subjective:   [name]: Andrew Patti., male DOB: [birth year], 74 y.o. MRN: [id number]     PMH, FamHx and SocHx reviewed for any changes and relevance.   Current Outpatient Prescriptions on File Prior to Visit   Medication  Sig  Dispense  Refill   .  acyclovir (ZOVIRAX) 200 MG capsule  Take 200 mg by mouth 3 (three) times daily as needed. For herpes outbreaks     .  acyclovir (ZOVIRAX) 200 MG capsule  TAKE ONE CAPSULE THREE TIMES A DAY AS NEEDED  90 capsule  3   .  ammonium lactate (LAC-HYDRIN) 12 % lotion  Apply 1 application topically 2 (two) times daily.     Marland Kitchen  aspirin EC 81 MG tablet  Take 81 mg by mouth daily.     Marland Kitchen  atorvastatin (LIPITOR) 20 MG tablet  take 1 tablet by mouth once daily  30 tablet  6   .  citalopram (CELEXA) 10 MG tablet  Take 1 tablet (10 mg total) by mouth daily.  90 tablet  3   .  clonazePAM (KLONOPIN) 0.5 MG tablet  TAKE 1 TABLET EVERY 8 HOURS AS NEEDED  90 tablet  5   .  clopidogrel (PLAVIX) 75 MG tablet  take 1 tablet by mouth once daily  30 tablet  5   .  glimepiride (AMARYL) 1 MG tablet  Take 1 tablet (1 mg total) by mouth daily before breakfast.  90 tablet  3   .  glucose blood (ONE TOUCH TEST STRIPS) test strip  Use 1 strip to check blood sugar daily. Dx 250.00  50 each  3   .  hydrochlorothiazide (HYDRODIURIL) 25 MG tablet  take 1 tablet by mouth once daily  90 tablet  3   .  HYDROcodone-homatropine (HYCODAN) 5-1.5 MG/5ML syrup  Take 5 mLs by mouth every 6 (six) hours as needed for cough.  120 mL  1   .  ONE TOUCH LANCETS MISC  Use daily to get blood sugar. Dx 250.00  30 each  3   .  pantoprazole (PROTONIX)  40 MG tablet  take 1 tablet by mouth once daily  30 tablet  5   .  predniSONE (DELTASONE) 10 MG tablet  3 tabs by mouth per day for 3 days,2tabs per day for 3 days,1tab per day for 3 days  18 tablet  0   .  PREVIDENT 5000 BOOSTER PLUS 1.1 % PSTE      .  Tamsulosin HCl (FLOMAX) 0.4 MG CAPS  Take 1 capsule by mouth at bedtime.     Marland Kitchen  atorvastatin (LIPITOR) 20 MG tablet  Take 20 mg by mouth daily.      BP Readings from Last 3 Encounters:  05/07/14 150/82  04/23/14 152/84  03/07/14 113/68   Wt Readings from Last 3 Encounters:  05/07/14 199 lb (90.266 kg)  04/23/14 199 lb (90.266 kg)  03/07/14 202 lb 3.2 oz (91.717 kg)    130/80 rechecked   Review of Systems  Constitutional: Negative for appetite change, fatigue and unexpected weight change.  HENT: Negative for congestion, nosebleeds, sneezing and  trouble swallowing.   Eyes: Negative for itching and visual disturbance.  Cardiovascular: Negative for palpitations and leg swelling.  Gastrointestinal: Negative for nausea, diarrhea, blood in stool and abdominal distention.  Genitourinary: Negative for frequency and hematuria.  Musculoskeletal: Positive for arthralgias. Negative for back pain, joint swelling, gait problem and neck pain.  Neurological: Negative for dizziness, tremors, speech difficulty and weakness.  Psychiatric/Behavioral: Negative for sleep disturbance, dysphoric mood and agitation. The patient is not nervous/anxious.        Objective:   Physical Exam  Constitutional: He is oriented to person, place, and time. He appears well-developed. No distress.  NAD  HENT:  Mouth/Throat: Oropharynx is clear and moist.  Eyes: Conjunctivae are normal. Pupils are equal, round, and reactive to light.  Neck: Normal range of motion. No JVD present. No thyromegaly present.  Cardiovascular: Normal rate, regular rhythm, normal heart sounds and intact distal pulses.  Exam reveals no gallop and no friction rub.   No murmur  heard. Pulmonary/Chest: Effort normal. No respiratory distress. He has wheezes. He has no rales. He exhibits no tenderness.  Abdominal: Soft. Bowel sounds are normal. He exhibits no distension and no mass. There is no tenderness. There is no rebound and no guarding.  Musculoskeletal: Normal range of motion. He exhibits no edema or tenderness.  Lymphadenopathy:    He has no cervical adenopathy.  Neurological: He is alert and oriented to person, place, and time. He has normal reflexes. No cranial nerve deficit. He exhibits normal muscle tone. He displays a negative Romberg sign. Coordination and gait normal.  No meningeal signs  Skin: Skin is warm and dry. No rash noted.  Psychiatric: He has a normal mood and affect. His behavior is normal. Judgment and thought content normal.  Crackles at L base  Lab Results  Component Value Date   WBC 4.6 10/16/2013   HGB 12.9* 10/16/2013   HCT 38.5* 10/16/2013   PLT 189.0 10/16/2013   GLUCOSE 186* 10/16/2013   CHOL 122 10/16/2013   TRIG 85.0 10/16/2013   HDL 32.70* 10/16/2013   LDLDIRECT 89.5 06/27/2012   LDLCALC 72 10/16/2013   ALT 30 10/16/2013   AST 22 10/16/2013   NA 142 10/16/2013   K 4.3 10/16/2013   CL 107 10/16/2013   CREATININE 1.0 10/16/2013   BUN 13 10/16/2013   CO2 25 10/16/2013   TSH 1.08 10/24/2012   PSA 0.66 09/06/2007   INR 0.95 08/05/2011   HGBA1C 6.3 10/24/2012    GXT was nl     Assessment & Plan:

## 2014-05-07 NOTE — Progress Notes (Signed)
Pre visit review using our clinic review tool, if applicable. No additional management support is needed unless otherwise documented below in the visit note. 

## 2014-05-07 NOTE — Assessment & Plan Note (Addendum)
Cont w/Vit B12 Rx SL

## 2014-05-21 ENCOUNTER — Ambulatory Visit: Payer: Medicare Other | Admitting: Internal Medicine

## 2014-05-25 ENCOUNTER — Other Ambulatory Visit: Payer: Self-pay | Admitting: Internal Medicine

## 2014-07-02 ENCOUNTER — Ambulatory Visit (INDEPENDENT_AMBULATORY_CARE_PROVIDER_SITE_OTHER): Payer: Medicare Other | Admitting: Internal Medicine

## 2014-07-02 ENCOUNTER — Encounter: Payer: Self-pay | Admitting: Internal Medicine

## 2014-07-02 VITALS — BP 170/84 | HR 72 | Wt 203.0 lb

## 2014-07-02 DIAGNOSIS — M5440 Lumbago with sciatica, unspecified side: Secondary | ICD-10-CM | POA: Diagnosis not present

## 2014-07-02 DIAGNOSIS — I10 Essential (primary) hypertension: Secondary | ICD-10-CM | POA: Diagnosis not present

## 2014-07-02 DIAGNOSIS — I639 Cerebral infarction, unspecified: Secondary | ICD-10-CM

## 2014-07-02 DIAGNOSIS — M545 Low back pain, unspecified: Secondary | ICD-10-CM | POA: Insufficient documentation

## 2014-07-02 MED ORDER — LOSARTAN POTASSIUM-HCTZ 100-12.5 MG PO TABS: 1.0000 | ORAL_TABLET | Freq: Every day | ORAL | Status: DC

## 2014-07-02 MED ORDER — TRAMADOL HCL 50 MG PO TABS: 50.0000 mg | ORAL_TABLET | Freq: Two times a day (BID) | ORAL | Status: DC | PRN

## 2014-07-02 MED ORDER — NAPROXEN 500 MG PO TABS: 500.0000 mg | ORAL_TABLET | Freq: Two times a day (BID) | ORAL | Status: DC

## 2014-07-02 NOTE — Progress Notes (Signed)
Pre visit review using our clinic review tool, if applicable. No additional management support is needed unless otherwise documented below in the visit note. 

## 2014-07-02 NOTE — Progress Notes (Signed)
Subjective:    HPI  C/o LBP x 2 wks - worse  The patient needs to address chronic  hyperlipidemia controlled with medicines as well; and to address type 2 chronic diabetes, controlled with medical treatment and diet.  F/u HTN - high BP at home  On CPAP   Subjective:   [name]: Andrew Malone., male DOB: [birth year], 74 y.o. MRN: [id number]     PMH, FamHx and SocHx reviewed for any changes and relevance.   Current Outpatient Prescriptions on File Prior to Visit   Medication  Sig  Dispense  Refill   .  acyclovir (ZOVIRAX) 200 MG capsule  Take 200 mg by mouth 3 (three) times daily as needed. For herpes outbreaks     .  acyclovir (ZOVIRAX) 200 MG capsule  TAKE ONE CAPSULE THREE TIMES A DAY AS NEEDED  90 capsule  3   .  ammonium lactate (LAC-HYDRIN) 12 % lotion  Apply 1 application topically 2 (two) times daily.     Marland Kitchen  aspirin EC 81 MG tablet  Take 81 mg by mouth daily.     Marland Kitchen  atorvastatin (LIPITOR) 20 MG tablet  take 1 tablet by mouth once daily  30 tablet  6   .  citalopram (CELEXA) 10 MG tablet  Take 1 tablet (10 mg total) by mouth daily.  90 tablet  3   .  clonazePAM (KLONOPIN) 0.5 MG tablet  TAKE 1 TABLET EVERY 8 HOURS AS NEEDED  90 tablet  5   .  clopidogrel (PLAVIX) 75 MG tablet  take 1 tablet by mouth once daily  30 tablet  5   .  glimepiride (AMARYL) 1 MG tablet  Take 1 tablet (1 mg total) by mouth daily before breakfast.  90 tablet  3   .  glucose blood (ONE TOUCH TEST STRIPS) test strip  Use 1 strip to check blood sugar daily. Dx 250.00  50 each  3   .  hydrochlorothiazide (HYDRODIURIL) 25 MG tablet  take 1 tablet by mouth once daily  90 tablet  3   .  HYDROcodone-homatropine (HYCODAN) 5-1.5 MG/5ML syrup  Take 5 mLs by mouth every 6 (six) hours as needed for cough.  120 mL  1   .  ONE TOUCH LANCETS MISC  Use daily to get blood sugar. Dx 250.00  30 each  3   .  pantoprazole (PROTONIX) 40 MG tablet  take 1 tablet by mouth once daily  30 tablet  5   .  predniSONE  (DELTASONE) 10 MG tablet  3 tabs by mouth per day for 3 days,2tabs per day for 3 days,1tab per day for 3 days  18 tablet  0   .  PREVIDENT 5000 BOOSTER PLUS 1.1 % PSTE      .  Tamsulosin HCl (FLOMAX) 0.4 MG CAPS  Take 1 capsule by mouth at bedtime.     Marland Kitchen  atorvastatin (LIPITOR) 20 MG tablet  Take 20 mg by mouth daily.      BP Readings from Last 3 Encounters:  07/02/14 170/84  05/07/14 150/82  04/23/14 152/84   Wt Readings from Last 3 Encounters:  07/02/14 203 lb (92.08 kg)  05/07/14 199 lb (90.266 kg)  04/23/14 199 lb (90.266 kg)    130/80 rechecked   Review of Systems  Constitutional: Negative for appetite change, fatigue and unexpected weight change.  HENT: Negative for congestion, nosebleeds, sneezing and trouble swallowing.   Eyes: Negative for itching and  visual disturbance.  Cardiovascular: Negative for palpitations and leg swelling.  Gastrointestinal: Negative for nausea, diarrhea, blood in stool and abdominal distention.  Genitourinary: Negative for frequency and hematuria.  Musculoskeletal: Positive for arthralgias. Negative for back pain, joint swelling, gait problem and neck pain.  Neurological: Negative for dizziness, tremors, speech difficulty and weakness.  Psychiatric/Behavioral: Negative for sleep disturbance, dysphoric mood and agitation. The patient is not nervous/anxious.        Objective:   Physical Exam  Constitutional: He is oriented to person, place, and time. He appears well-developed. No distress.  NAD  HENT:  Mouth/Throat: Oropharynx is clear and moist.  Eyes: Conjunctivae are normal. Pupils are equal, round, and reactive to light.  Neck: Normal range of motion. No JVD present. No thyromegaly present.  Cardiovascular: Normal rate, regular rhythm, normal heart sounds and intact distal pulses.  Exam reveals no gallop and no friction rub.   No murmur heard. Pulmonary/Chest: Effort normal. No respiratory distress. He has wheezes. He has no rales. He  exhibits no tenderness.  Abdominal: Soft. Bowel sounds are normal. He exhibits no distension and no mass. There is no tenderness. There is no rebound and no guarding.  Musculoskeletal: Normal range of motion. He exhibits no edema or tenderness.  Lymphadenopathy:    He has no cervical adenopathy.  Neurological: He is alert and oriented to person, place, and time. He has normal reflexes. No cranial nerve deficit. He exhibits normal muscle tone. He displays a negative Romberg sign. Coordination and gait normal.  No meningeal signs  Skin: Skin is warm and dry. No rash noted.  Psychiatric: He has a normal mood and affect. His behavior is normal. Judgment and thought content normal.  LS is tender, stiff  Lab Results  Component Value Date   WBC 4.6 10/16/2013   HGB 12.9* 10/16/2013   HCT 38.5* 10/16/2013   PLT 189.0 10/16/2013   GLUCOSE 186* 10/16/2013   CHOL 122 10/16/2013   TRIG 85.0 10/16/2013   HDL 32.70* 10/16/2013   LDLDIRECT 89.5 06/27/2012   LDLCALC 72 10/16/2013   ALT 30 10/16/2013   AST 22 10/16/2013   NA 142 10/16/2013   K 4.3 10/16/2013   CL 107 10/16/2013   CREATININE 1.0 10/16/2013   BUN 13 10/16/2013   CO2 25 10/16/2013   TSH 1.08 10/24/2012   PSA 0.66 09/06/2007   INR 0.95 08/05/2011   HGBA1C 6.3 10/24/2012    GXT was nl     Assessment & Plan:

## 2014-07-02 NOTE — Patient Instructions (Signed)
ROM exercises 

## 2014-07-02 NOTE — Assessment & Plan Note (Signed)
Naproxen Tramadol ROM exercises RTC 2 wks

## 2014-07-02 NOTE — Assessment & Plan Note (Signed)
Worse D/c Losartan, start Losartan HCT

## 2014-07-11 ENCOUNTER — Ambulatory Visit: Payer: Medicare Other | Admitting: Internal Medicine

## 2014-07-16 ENCOUNTER — Encounter (HOSPITAL_COMMUNITY): Payer: Self-pay | Admitting: Emergency Medicine

## 2014-07-16 ENCOUNTER — Emergency Department (HOSPITAL_COMMUNITY)
Admission: EM | Admit: 2014-07-16 | Discharge: 2014-07-16 | Disposition: A | Discharge status: Home / Self Care | Payer: Medicare Other | Attending: Emergency Medicine | Admitting: Emergency Medicine

## 2014-07-16 ENCOUNTER — Emergency Department (HOSPITAL_COMMUNITY): Payer: Medicare Other

## 2014-07-16 ENCOUNTER — Telehealth: Payer: Self-pay | Admitting: Internal Medicine

## 2014-07-16 DIAGNOSIS — R011 Cardiac murmur, unspecified: Secondary | ICD-10-CM | POA: Diagnosis not present

## 2014-07-16 DIAGNOSIS — Z7902 Long term (current) use of antithrombotics/antiplatelets: Secondary | ICD-10-CM | POA: Insufficient documentation

## 2014-07-16 DIAGNOSIS — M545 Low back pain, unspecified: Secondary | ICD-10-CM

## 2014-07-16 DIAGNOSIS — G8929 Other chronic pain: Secondary | ICD-10-CM | POA: Diagnosis not present

## 2014-07-16 DIAGNOSIS — Z79899 Other long term (current) drug therapy: Secondary | ICD-10-CM | POA: Diagnosis not present

## 2014-07-16 DIAGNOSIS — S339XXA Sprain of unspecified parts of lumbar spine and pelvis, initial encounter: Secondary | ICD-10-CM | POA: Diagnosis not present

## 2014-07-16 DIAGNOSIS — I10 Essential (primary) hypertension: Secondary | ICD-10-CM | POA: Insufficient documentation

## 2014-07-16 DIAGNOSIS — K219 Gastro-esophageal reflux disease without esophagitis: Secondary | ICD-10-CM | POA: Insufficient documentation

## 2014-07-16 DIAGNOSIS — E785 Hyperlipidemia, unspecified: Secondary | ICD-10-CM | POA: Insufficient documentation

## 2014-07-16 DIAGNOSIS — R52 Pain, unspecified: Secondary | ICD-10-CM

## 2014-07-16 DIAGNOSIS — M1990 Unspecified osteoarthritis, unspecified site: Secondary | ICD-10-CM | POA: Insufficient documentation

## 2014-07-16 DIAGNOSIS — Z7982 Long term (current) use of aspirin: Secondary | ICD-10-CM | POA: Diagnosis not present

## 2014-07-16 DIAGNOSIS — Z9981 Dependence on supplemental oxygen: Secondary | ICD-10-CM | POA: Insufficient documentation

## 2014-07-16 DIAGNOSIS — I2510 Atherosclerotic heart disease of native coronary artery without angina pectoris: Secondary | ICD-10-CM | POA: Diagnosis not present

## 2014-07-16 DIAGNOSIS — G4733 Obstructive sleep apnea (adult) (pediatric): Secondary | ICD-10-CM | POA: Diagnosis not present

## 2014-07-16 DIAGNOSIS — Z87448 Personal history of other diseases of urinary system: Secondary | ICD-10-CM | POA: Insufficient documentation

## 2014-07-16 DIAGNOSIS — Z85828 Personal history of other malignant neoplasm of skin: Secondary | ICD-10-CM | POA: Insufficient documentation

## 2014-07-16 DIAGNOSIS — Z8673 Personal history of transient ischemic attack (TIA), and cerebral infarction without residual deficits: Secondary | ICD-10-CM | POA: Diagnosis not present

## 2014-07-16 LAB — URINALYSIS, ROUTINE W REFLEX MICROSCOPIC
BILIRUBIN URINE: NEGATIVE
GLUCOSE, UA: NEGATIVE mg/dL
KETONES UR: NEGATIVE mg/dL
Leukocytes, UA: NEGATIVE
NITRITE: NEGATIVE
PH: 6 (ref 5.0–8.0)
Protein, ur: NEGATIVE mg/dL
SPECIFIC GRAVITY, URINE: 1.01 (ref 1.005–1.030)
Urobilinogen, UA: 0.2 mg/dL (ref 0.0–1.0)

## 2014-07-16 LAB — URINE MICROSCOPIC-ADD ON

## 2014-07-16 MED ORDER — HYDROCODONE-ACETAMINOPHEN 5-325 MG PO TABS: 1.0000 | ORAL_TABLET | Freq: Four times a day (QID) | ORAL | Status: DC | PRN

## 2014-07-16 MED ORDER — HYDROCODONE-ACETAMINOPHEN 5-325 MG PO TABS
2.0000 | ORAL_TABLET | Freq: Once | ORAL | Status: AC
  Administered 2014-07-16: 2 via ORAL
  Filled 2014-07-16: qty 2

## 2014-07-16 NOTE — Telephone Encounter (Signed)
Noted. We are not allowed to give orders to ER now. They did the right thing when they went to ER. Thx

## 2014-07-16 NOTE — ED Notes (Addendum)
PT has chronic lower back pain but this is the worse pain he has had with it. Reports sitting down makes it worse so stands up of lies down; Does not radiate; pain is constant but gets worse at times. This episode has been going on since Saturday. Denies injury. Has been taking tramadol and naproxen which does not seem to provide any relief. Denies incontinence of any kind; denies numbness of tingling.

## 2014-07-16 NOTE — Discharge Instructions (Signed)
Take Tylenol for mild pain or the pain medicine prescribed for bad pain. Keep your scheduled appointment with tomorrow with Dr.Plotnikov. Do not take the pain medicine prescribed together with Klonopin (clonezepam), as the combination can be dangerous

## 2014-07-16 NOTE — ED Notes (Signed)
Pt returned from xray without distress.

## 2014-07-16 NOTE — Telephone Encounter (Signed)
Patients wife states he can not sick and thinks he needs to go to ER.  Wife is requesting that Dr. Alain Marion call over to ER to let them know patient is on his way.  Wife does not want him to sick in the waiting room at ER.

## 2014-07-16 NOTE — ED Notes (Signed)
Patient transported to X-ray without distress.  
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
## 2014-07-16 NOTE — Telephone Encounter (Signed)
I called pt's wife at home and cell. No answer. Left mess for patient to call back.

## 2014-07-16 NOTE — Telephone Encounter (Signed)
Wife called back- she is upset with our office because of the telephones. She states she has pt at [facility] ER now and hung up on me.

## 2014-07-17 ENCOUNTER — Ambulatory Visit (INDEPENDENT_AMBULATORY_CARE_PROVIDER_SITE_OTHER): Payer: Medicare Other | Admitting: Pulmonary Disease

## 2014-07-17 ENCOUNTER — Encounter: Payer: Self-pay | Admitting: Internal Medicine

## 2014-07-17 ENCOUNTER — Ambulatory Visit (INDEPENDENT_AMBULATORY_CARE_PROVIDER_SITE_OTHER): Payer: Medicare Other | Admitting: Internal Medicine

## 2014-07-17 ENCOUNTER — Encounter: Payer: Self-pay | Admitting: Pulmonary Disease

## 2014-07-17 VITALS — BP 126/68 | HR 70 | Ht 70.0 in | Wt 200.0 lb

## 2014-07-17 VITALS — BP 139/80 | HR 63 | Wt 201.0 lb

## 2014-07-17 DIAGNOSIS — I10 Essential (primary) hypertension: Secondary | ICD-10-CM

## 2014-07-17 DIAGNOSIS — E538 Deficiency of other specified B group vitamins: Secondary | ICD-10-CM | POA: Diagnosis not present

## 2014-07-17 DIAGNOSIS — G4733 Obstructive sleep apnea (adult) (pediatric): Secondary | ICD-10-CM | POA: Diagnosis not present

## 2014-07-17 DIAGNOSIS — Z9989 Dependence on other enabling machines and devices: Principal | ICD-10-CM

## 2014-07-17 DIAGNOSIS — I639 Cerebral infarction, unspecified: Secondary | ICD-10-CM

## 2014-07-17 DIAGNOSIS — M545 Low back pain, unspecified: Secondary | ICD-10-CM

## 2014-07-17 MED ORDER — HYDROCODONE-ACETAMINOPHEN 5-325 MG PO TABS: 1.0000 | ORAL_TABLET | Freq: Four times a day (QID) | ORAL | Status: DC | PRN

## 2014-07-17 NOTE — Progress Notes (Signed)
Pre visit review using our clinic review tool, if applicable. No additional management support is needed unless otherwise documented below in the visit note. 

## 2014-07-17 NOTE — Patient Instructions (Signed)
Will get copy of CPAP report Follow up in 2 months

## 2014-07-17 NOTE — Assessment & Plan Note (Signed)
On B12 

## 2014-07-17 NOTE — Progress Notes (Signed)
Subjective:    HPI  C/o LBP x 3 wks - worse last week. F/u ER visit - given Norco Rx (he did not feel it); 2 Norco helped w/pain in ER  The patient needs to address chronic  hyperlipidemia controlled with medicines as well; and to address type 2 chronic diabetes, controlled with medical treatment and diet.  F/u HTN - high BP at home  On CPAP   Subjective:   [name]: Andrew Malone., male DOB: 09-03-1940, 74 y.o. MRN: [id number]     PMH, FamHx and SocHx reviewed for any changes and relevance.   Current Outpatient Prescriptions on File Prior to Visit   Medication  Sig  Dispense  Refill   .  acyclovir (ZOVIRAX) 200 MG capsule  Take 200 mg by mouth 3 (three) times daily as needed. For herpes outbreaks     .  acyclovir (ZOVIRAX) 200 MG capsule  TAKE ONE CAPSULE THREE TIMES A DAY AS NEEDED  90 capsule  3   .  ammonium lactate (LAC-HYDRIN) 12 % lotion  Apply 1 application topically 2 (two) times daily.     Marland Kitchen  aspirin EC 81 MG tablet  Take 81 mg by mouth daily.     Marland Kitchen  atorvastatin (LIPITOR) 20 MG tablet  take 1 tablet by mouth once daily  30 tablet  6   .  citalopram (CELEXA) 10 MG tablet  Take 1 tablet (10 mg total) by mouth daily.  90 tablet  3   .  clonazePAM (KLONOPIN) 0.5 MG tablet  TAKE 1 TABLET EVERY 8 HOURS AS NEEDED  90 tablet  5   .  clopidogrel (PLAVIX) 75 MG tablet  take 1 tablet by mouth once daily  30 tablet  5   .  glimepiride (AMARYL) 1 MG tablet  Take 1 tablet (1 mg total) by mouth daily before breakfast.  90 tablet  3   .  glucose blood (ONE TOUCH TEST STRIPS) test strip  Use 1 strip to check blood sugar daily. Dx 250.00  50 each  3   .  hydrochlorothiazide (HYDRODIURIL) 25 MG tablet  take 1 tablet by mouth once daily  90 tablet  3   .  HYDROcodone-homatropine (HYCODAN) 5-1.5 MG/5ML syrup  Take 5 mLs by mouth every 6 (six) hours as needed for cough.  120 mL  1   .  ONE TOUCH LANCETS MISC  Use daily to get blood sugar. Dx 250.00  30 each  3   .  pantoprazole  (PROTONIX) 40 MG tablet  take 1 tablet by mouth once daily  30 tablet  5   .  predniSONE (DELTASONE) 10 MG tablet  3 tabs by mouth per day for 3 days,2tabs per day for 3 days,1tab per day for 3 days  18 tablet  0   .  PREVIDENT 5000 BOOSTER PLUS 1.1 % PSTE      .  Tamsulosin HCl (FLOMAX) 0.4 MG CAPS  Take 1 capsule by mouth at bedtime.     Marland Kitchen  atorvastatin (LIPITOR) 20 MG tablet  Take 20 mg by mouth daily.      BP Readings from Last 3 Encounters:  07/17/14 139/80  07/17/14 126/68  07/16/14 143/56   Wt Readings from Last 3 Encounters:  07/17/14 201 lb (91.173 kg)  07/17/14 200 lb (90.719 kg)  07/02/14 203 lb (92.08 kg)    130/80 rechecked   Review of Systems  Constitutional: Negative for appetite change, fatigue and  unexpected weight change.  HENT: Negative for congestion, nosebleeds, sneezing and trouble swallowing.   Eyes: Negative for itching and visual disturbance.  Cardiovascular: Negative for palpitations and leg swelling.  Gastrointestinal: Negative for nausea, diarrhea, blood in stool and abdominal distention.  Genitourinary: Negative for frequency and hematuria.  Musculoskeletal: Positive for arthralgias. Negative for back pain, joint swelling, gait problem and neck pain.  Neurological: Negative for dizziness, tremors, speech difficulty and weakness.  Psychiatric/Behavioral: Negative for sleep disturbance, dysphoric mood and agitation. The patient is not nervous/anxious.        Objective:   Physical Exam  Constitutional: He is oriented to person, place, and time. He appears well-developed. No distress.  NAD  HENT:  Mouth/Throat: Oropharynx is clear and moist.  Eyes: Conjunctivae are normal. Pupils are equal, round, and reactive to light.  Neck: Normal range of motion. No JVD present. No thyromegaly present.  Cardiovascular: Normal rate, regular rhythm, normal heart sounds and intact distal pulses.  Exam reveals no gallop and no friction rub.   No murmur  heard. Pulmonary/Chest: Effort normal. No respiratory distress. He has wheezes. He has no rales. He exhibits no tenderness.  Abdominal: Soft. Bowel sounds are normal. He exhibits no distension and no mass. There is no tenderness. There is no rebound and no guarding.  Musculoskeletal: Normal range of motion. He exhibits no edema or tenderness.  Lymphadenopathy:    He has no cervical adenopathy.  Neurological: He is alert and oriented to person, place, and time. He has normal reflexes. No cranial nerve deficit. He exhibits normal muscle tone. He displays a negative Romberg sign. Coordination and gait normal.  No meningeal signs  Skin: Skin is warm and dry. No rash noted.  Psychiatric: He has a normal mood and affect. His behavior is normal. Judgment and thought content normal.  LS is tender, stiff  Lab Results  Component Value Date   WBC 4.6 10/16/2013   HGB 12.9* 10/16/2013   HCT 38.5* 10/16/2013   PLT 189.0 10/16/2013   GLUCOSE 186* 10/16/2013   CHOL 122 10/16/2013   TRIG 85.0 10/16/2013   HDL 32.70* 10/16/2013   LDLDIRECT 89.5 06/27/2012   LDLCALC 72 10/16/2013   ALT 30 10/16/2013   AST 22 10/16/2013   NA 142 10/16/2013   K 4.3 10/16/2013   CL 107 10/16/2013   CREATININE 1.0 10/16/2013   BUN 13 10/16/2013   CO2 25 10/16/2013   TSH 1.08 10/24/2012   PSA 0.66 09/06/2007   INR 0.95 08/05/2011   HGBA1C 6.3 10/24/2012  ER notes reviewed  GXT was nl     Assessment & Plan:

## 2014-07-17 NOTE — Assessment & Plan Note (Addendum)
Norco prn  Potential benefits of a short term opioids use as well as potential risks (i.e. addiction risk, apnea etc) and complications (i.e. Somnolence, constipation and others) were explained to the patient and were aknowledged.

## 2014-07-17 NOTE — Assessment & Plan Note (Signed)
On Losartan HCT Re-checked BP is ok

## 2014-07-17 NOTE — Progress Notes (Signed)
Chief Complaint  Patient presents with  . Follow-up    Pt c/o insomina. Pt uses CPAP 5-7 hours nightly. Pt was seen in ER on 07/16/14 with back problems at [facility].     History of Present Illness: Andrew Malone. is a 74 y.o. male with OSA.  He has noticed more trouble with his sleep.  He falls asleep okay, but then wakes up frequently.  His wife says he snores and gurgles while asleep.  He uses his CPAP every night.    He goes to bed at 9 pm.  He falls asleep in a few minutes.  He wakes up between 230 and 430 am, and gets out of bed at 510 am.  He does not nap during the day.  He takes klonopin about 5 nights per week before going to bed.  He is not using anything to help him stay awake.  He was in the ER early this week due to back pain.  He was taking tramadol >> no help.  He took one of his wife's oxycodone >> helped some, but only lasted few hours.  He denies feeling depressed.  TESTS: PSG 09/14/10>>AHI 8, SpO2 low 84% Auto CPAP 02/18/12 to 05/17/12 >> Used on 60 of 90 nights with average 6 hrs 32 min.  Average AHI 0.6 with median CPAP 6 cm H2O and 95th percentile CPAP 8 cm H2O. Echo 06/30/12 >> EF 55 to 62%, grade 1 diastolic dysfx, mild AS, PAS 31 mmHg  PMHx >> CAD, Carotid artery disease, CVA, HLD, HTN, GERD, GN, HH, PUD, chronic back pain, Anxiety, Aortic stenosis  PSHx, Medications, Allergies, Fhx, Shx reviewed.  Physical Exam: Blood pressure 126/68, pulse 70, height 5\' 10"  (1.778 m), weight 200 lb (90.719 kg), SpO2 96 %. Body mass index is 28.7 kg/(m^2).  General - No distress ENT - No sinus tenderness, no oral exudate, no LAN, MP 3 Cardiac - s1s2 regular, 2/6 SM Chest - No wheeze/rales/dullness Back - No focal tenderness Abd - Soft, non-tender Ext - No edema Neuro - Normal strength Skin - No rashes Psych - normal mood, and behavior   Assessment/Plan:  Obstructive sleep apnea. Plan: - will get CPAP download  Insomnia. This might be related to  uncontrolled sleep apnea. Plan: - optimize tx for sleep apnea first, and then re-assess if he needs further tx for insomnia  Back pain. Plan: - advised him to f/u with his PCP   Chesley Mires, MD [place] [facility] Pager:  [phone] 07/17/2014, 12:05 PM

## 2014-08-08 ENCOUNTER — Other Ambulatory Visit: Payer: Self-pay | Admitting: Pulmonary Disease

## 2014-08-08 DIAGNOSIS — G4733 Obstructive sleep apnea (adult) (pediatric): Secondary | ICD-10-CM

## 2014-08-10 ENCOUNTER — Telehealth: Payer: Self-pay | Admitting: Pulmonary Disease

## 2014-08-10 NOTE — Telephone Encounter (Signed)
lmtcb for pt.  

## 2014-08-13 ENCOUNTER — Telehealth: Payer: Self-pay | Admitting: Pulmonary Disease

## 2014-08-13 NOTE — Telephone Encounter (Signed)
Per [facility] spoke with him late Friday afternoon. He is scheduled for a home visit on 6/14.  thanks   Nothing further needed

## 2014-08-13 NOTE — Telephone Encounter (Signed)
Noted and will sign off.   

## 2014-08-13 NOTE — Telephone Encounter (Signed)
i have sent [facility] a staff message. Will await response.

## 2014-08-14 ENCOUNTER — Ambulatory Visit (INDEPENDENT_AMBULATORY_CARE_PROVIDER_SITE_OTHER): Payer: Medicare Other | Admitting: Internal Medicine

## 2014-08-14 ENCOUNTER — Encounter: Payer: Self-pay | Admitting: Internal Medicine

## 2014-08-14 VITALS — BP 132/78 | HR 64 | Temp 97.5°F | Wt 199.0 lb

## 2014-08-14 DIAGNOSIS — F329 Major depressive disorder, single episode, unspecified: Secondary | ICD-10-CM | POA: Diagnosis not present

## 2014-08-14 DIAGNOSIS — M5440 Lumbago with sciatica, unspecified side: Secondary | ICD-10-CM

## 2014-08-14 DIAGNOSIS — I639 Cerebral infarction, unspecified: Secondary | ICD-10-CM

## 2014-08-14 DIAGNOSIS — F32A Depression, unspecified: Secondary | ICD-10-CM

## 2014-08-14 DIAGNOSIS — I10 Essential (primary) hypertension: Secondary | ICD-10-CM

## 2014-08-14 NOTE — Assessment & Plan Note (Signed)
Chronic  On Citalopram

## 2014-08-14 NOTE — Patient Instructions (Signed)
CLINICAL DATA: Low back twisting injury 2 weeks ago. Worsening pain the past few days.  EXAM: LUMBAR SPINE - COMPLETE 4+ VIEW  COMPARISON: MRI 08/04/2012  FINDINGS: Bilateral L5 pars defects appear to be present on the lateral view. Slight anterolisthesis of L5 on S1, approximately 6 mm. No fracture. Disc spaces are maintained. SI joints are symmetric and unremarkable.  IMPRESSION: No acute bony abnormality.  Bilateral L5 pars defects with grade 1 anterolisthesis.   Electronically Signed  By: Rolm Baptise M.D.  On: 07/16/2014 12:46

## 2014-08-14 NOTE — Assessment & Plan Note (Signed)
Doing better Norco - rare

## 2014-08-14 NOTE — Progress Notes (Signed)
Subjective:    HPI  C/o LBP x 6-7 wks - much better.   The patient needs to address chronic  hyperlipidemia controlled with medicines as well; and to address type 2 chronic diabetes, controlled with medical treatment and diet.  F/u HTN - high BP at home  On CPAP   Subjective:   [name]: Andrew Malone., male DOB: [birth year], 74 y.o. MRN: [id number]     PMH, FamHx and SocHx reviewed for any changes and relevance.   Current Outpatient Prescriptions on File Prior to Visit   Medication  Sig  Dispense  Refill   .  acyclovir (ZOVIRAX) 200 MG capsule  Take 200 mg by mouth 3 (three) times daily as needed. For herpes outbreaks     .  acyclovir (ZOVIRAX) 200 MG capsule  TAKE ONE CAPSULE THREE TIMES A DAY AS NEEDED  90 capsule  3   .  ammonium lactate (LAC-HYDRIN) 12 % lotion  Apply 1 application topically 2 (two) times daily.     Marland Kitchen  aspirin EC 81 MG tablet  Take 81 mg by mouth daily.     Marland Kitchen  atorvastatin (LIPITOR) 20 MG tablet  take 1 tablet by mouth once daily  30 tablet  6   .  citalopram (CELEXA) 10 MG tablet  Take 1 tablet (10 mg total) by mouth daily.  90 tablet  3   .  clonazePAM (KLONOPIN) 0.5 MG tablet  TAKE 1 TABLET EVERY 8 HOURS AS NEEDED  90 tablet  5   .  clopidogrel (PLAVIX) 75 MG tablet  take 1 tablet by mouth once daily  30 tablet  5   .  glimepiride (AMARYL) 1 MG tablet  Take 1 tablet (1 mg total) by mouth daily before breakfast.  90 tablet  3   .  glucose blood (ONE TOUCH TEST STRIPS) test strip  Use 1 strip to check blood sugar daily. Dx 250.00  50 each  3   .  hydrochlorothiazide (HYDRODIURIL) 25 MG tablet  take 1 tablet by mouth once daily  90 tablet  3   .  HYDROcodone-homatropine (HYCODAN) 5-1.5 MG/5ML syrup  Take 5 mLs by mouth every 6 (six) hours as needed for cough.  120 mL  1   .  ONE TOUCH LANCETS MISC  Use daily to get blood sugar. Dx 250.00  30 each  3   .  pantoprazole (PROTONIX) 40 MG tablet  take 1 tablet by mouth once daily  30 tablet  5   .   predniSONE (DELTASONE) 10 MG tablet  3 tabs by mouth per day for 3 days,2tabs per day for 3 days,1tab per day for 3 days  18 tablet  0   .  PREVIDENT 5000 BOOSTER PLUS 1.1 % PSTE      .  Tamsulosin HCl (FLOMAX) 0.4 MG CAPS  Take 1 capsule by mouth at bedtime.     Marland Kitchen  atorvastatin (LIPITOR) 20 MG tablet  Take 20 mg by mouth daily.      BP Readings from Last 3 Encounters:  08/14/14 132/78  07/17/14 139/80  07/17/14 126/68   Wt Readings from Last 3 Encounters:  08/14/14 199 lb (90.266 kg)  07/17/14 201 lb (91.173 kg)  07/17/14 200 lb (90.719 kg)    130/80 rechecked   Review of Systems  Constitutional: Negative for appetite change, fatigue and unexpected weight change.  HENT: Negative for congestion, nosebleeds, sneezing and trouble swallowing.   Eyes: Negative for  itching and visual disturbance.  Cardiovascular: Negative for palpitations and leg swelling.  Gastrointestinal: Negative for nausea, diarrhea, blood in stool and abdominal distention.  Genitourinary: Negative for frequency and hematuria.  Musculoskeletal: Positive for arthralgias. Negative for back pain, joint swelling, gait problem and neck pain.  Neurological: Negative for dizziness, tremors, speech difficulty and weakness.  Psychiatric/Behavioral: Negative for sleep disturbance, dysphoric mood and agitation. The patient is not nervous/anxious.        Objective:   Physical Exam  Constitutional: He is oriented to person, place, and time. He appears well-developed. No distress.  NAD  HENT:  Mouth/Throat: Oropharynx is clear and moist.  Eyes: Conjunctivae are normal. Pupils are equal, round, and reactive to light.  Neck: Normal range of motion. No JVD present. No thyromegaly present.  Cardiovascular: Normal rate, regular rhythm, normal heart sounds and intact distal pulses.  Exam reveals no gallop and no friction rub.   No murmur heard. Pulmonary/Chest: Effort normal. No respiratory distress. He has no wheezes. He has  no rales. He exhibits no tenderness.  Abdominal: Soft. Bowel sounds are normal. He exhibits no distension and no mass. There is no tenderness. There is no rebound and no guarding.  Musculoskeletal: Normal range of motion. He exhibits tenderness. He exhibits no edema.  Lymphadenopathy:    He has no cervical adenopathy.  Neurological: He is alert and oriented to person, place, and time. He has normal reflexes. No cranial nerve deficit. He exhibits normal muscle tone. He displays a negative Romberg sign. Coordination and gait normal.  Skin: Skin is warm and dry. No rash noted.  Psychiatric: He has a normal mood and affect. His behavior is normal. Judgment and thought content normal.  LS is not tender, not stiff  Lab Results  Component Value Date   WBC 4.6 10/16/2013   HGB 12.9* 10/16/2013   HCT 38.5* 10/16/2013   PLT 189.0 10/16/2013   GLUCOSE 186* 10/16/2013   CHOL 122 10/16/2013   TRIG 85.0 10/16/2013   HDL 32.70* 10/16/2013   LDLDIRECT 89.5 06/27/2012   LDLCALC 72 10/16/2013   ALT 30 10/16/2013   AST 22 10/16/2013   NA 142 10/16/2013   K 4.3 10/16/2013   CL 107 10/16/2013   CREATININE 1.0 10/16/2013   BUN 13 10/16/2013   CO2 25 10/16/2013   TSH 1.08 10/24/2012   PSA 0.66 09/06/2007   INR 0.95 08/05/2011   HGBA1C 6.3 10/24/2012    GXT was nl     Assessment & Plan:

## 2014-08-14 NOTE — Assessment & Plan Note (Signed)
BP Readings from Last 3 Encounters:  08/14/14 132/78  07/17/14 139/80  07/17/14 126/68  On Losartan HCT

## 2014-08-14 NOTE — Progress Notes (Signed)
Pre visit review using our clinic review tool, if applicable. No additional management support is needed unless otherwise documented below in the visit note. 

## 2014-08-19 ENCOUNTER — Other Ambulatory Visit: Payer: Self-pay | Admitting: Internal Medicine

## 2014-09-04 ENCOUNTER — Other Ambulatory Visit: Payer: Self-pay | Admitting: *Deleted

## 2014-09-04 MED ORDER — ACYCLOVIR 200 MG PO CAPS: 200.0000 mg | ORAL_CAPSULE | Freq: Two times a day (BID) | ORAL | Status: DC

## 2014-09-23 ENCOUNTER — Telehealth: Payer: Self-pay | Admitting: Pulmonary Disease

## 2014-09-23 NOTE — Telephone Encounter (Signed)
Auto CPAP 06/15/14 to 08/13/14 >> used on 60 of 60 nights with average 6 hrs and 17 min.  Average AHI is 1 with median CPAP 6 cm H2O and 95 th percentile CPAP 9 cm H20.   Will have my nurse inform pt that CPAP looks good.  No change to current set up needed.

## 2014-09-24 ENCOUNTER — Ambulatory Visit: Payer: Medicare Other | Admitting: Pulmonary Disease

## 2014-09-24 ENCOUNTER — Telehealth: Payer: Self-pay | Admitting: Pulmonary Disease

## 2014-09-24 NOTE — Telephone Encounter (Signed)
See phone note 09/24/14

## 2014-09-24 NOTE — Telephone Encounter (Signed)
I called spoke with pt spouse and scheduled appt for 11/19/14 w/ VS. She reports [facility] did a reading off CPAP and is requesting these results in the meantime. Please advise Dr. Halford Chessman thanks

## 2014-09-24 NOTE — Telephone Encounter (Signed)
I sent phone note to ashyt[facility] on 09/23/14 regarding this.  Details are in phone note from 09/23/14.

## 2014-09-24 NOTE — Telephone Encounter (Signed)
Chesley Mires, MD at 09/23/2014 8:44 AM     Status: Signed       Expand All Collapse All   Auto CPAP 06/15/14 to 08/13/14 >> used on 60 of 60 nights with average 6 hrs and 17 min. Average AHI is 1 with median CPAP 6 cm H2O and 95 th percentile CPAP 9 cm H20.   Will have my nurse inform pt that CPAP looks good. No change to current set up needed       I spoke with patient about results and he verbalized understanding and had no questions

## 2014-10-02 DIAGNOSIS — N401 Enlarged prostate with lower urinary tract symptoms: Secondary | ICD-10-CM | POA: Diagnosis not present
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
## 2014-10-18 ENCOUNTER — Encounter: Payer: Medicare Other | Admitting: Cardiology

## 2014-10-19 ENCOUNTER — Encounter: Payer: Self-pay | Admitting: *Deleted

## 2014-10-19 ENCOUNTER — Encounter: Payer: Self-pay | Admitting: Cardiology

## 2014-10-19 ENCOUNTER — Ambulatory Visit (INDEPENDENT_AMBULATORY_CARE_PROVIDER_SITE_OTHER): Payer: Medicare Other | Admitting: Cardiology

## 2014-10-19 VITALS — BP 148/62 | HR 70 | Ht 70.5 in | Wt 198.9 lb

## 2014-10-19 DIAGNOSIS — I639 Cerebral infarction, unspecified: Secondary | ICD-10-CM | POA: Diagnosis not present

## 2014-10-19 DIAGNOSIS — R079 Chest pain, unspecified: Secondary | ICD-10-CM | POA: Diagnosis not present

## 2014-10-19 DIAGNOSIS — I350 Nonrheumatic aortic (valve) stenosis: Secondary | ICD-10-CM | POA: Diagnosis not present

## 2014-10-19 DIAGNOSIS — E785 Hyperlipidemia, unspecified: Secondary | ICD-10-CM

## 2014-10-19 DIAGNOSIS — Z9189 Other specified personal risk factors, not elsewhere classified: Secondary | ICD-10-CM | POA: Diagnosis not present

## 2014-10-19 DIAGNOSIS — R072 Precordial pain: Secondary | ICD-10-CM

## 2014-10-19 NOTE — Assessment & Plan Note (Addendum)
Blood pressure is borderline but he follows this at home and typically control. Continue present medications and follow. Check potassium and renal function.

## 2014-10-19 NOTE — Assessment & Plan Note (Addendum)
Continue statin. Check lipids and liver. 

## 2014-10-19 NOTE — Assessment & Plan Note (Signed)
Patient is having chest pain with atypical features. This has been long-standing but multiple risk factors. Electrocardiogram shows no ST changes but there is left ventricular hypertrophy. Plan Lexiscan nuclear study for risk stratification. Low threshold for cardiac catheterization.

## 2014-10-19 NOTE — Assessment & Plan Note (Signed)
Patient is having increasing dyspnea on exertion.he does have aortic stenosis but it does not sound severe. Plan repeat echocardiogram to reassess. Follow-up in 8 weeks.

## 2014-10-19 NOTE — Patient Instructions (Signed)
Your physician recommends that you schedule a follow-up appointment in: [place] has requested that you have a lexiscan myoview. For further information please visit [email or web address]. Please follow instruction sheet, as given.   Your physician has requested that you have an echocardiogram. Echocardiography is a painless test that uses sound waves to create images of your heart. It provides your doctor with information about the size and shape of your heart and how well your heart's chambers and valves are working. This procedure takes approximately one hour. There are no restrictions for this procedure.   Your physician recommends that you return for lab work SAME DAY AS STRESS TEST AT [facility]

## 2014-10-19 NOTE — Assessment & Plan Note (Signed)
Continue aspirin and statin. 

## 2014-10-19 NOTE — Assessment & Plan Note (Signed)
Continue aspirin and statin. Scheduled follow-up carotid Dopplers January 2017.

## 2014-10-30 DIAGNOSIS — N138 Other obstructive and reflux uropathy: Secondary | ICD-10-CM | POA: Diagnosis not present

## 2014-10-30 DIAGNOSIS — N401 Enlarged prostate with lower urinary tract symptoms: Secondary | ICD-10-CM | POA: Diagnosis not present

## 2014-10-31 ENCOUNTER — Telehealth: Payer: Self-pay | Admitting: Cardiology

## 2014-10-31 NOTE — Telephone Encounter (Signed)
Spoke with pt wife, Aware of dr Jacalyn Lefevre recommendations.  They have left a message for dr deveshwar and if they do not hear from him today they will call back tomorrow.

## 2014-10-31 NOTE — Telephone Encounter (Signed)
Await results of scheduled studies Kirk Ruths

## 2014-10-31 NOTE — Telephone Encounter (Signed)
Patient is complaining of dizzy spells--he does have a carotid stent.  Please call

## 2014-10-31 NOTE — Telephone Encounter (Signed)
Spoke with pt, he was seen on 10-19-14, since that time he is c/o increase in frequency of headaches. They occur off and on and can last from a few minutes to couple hours. He is having a pain in his neck when he turns his head. He is having dizziness that occurs off and on and last only few seconds. The dizziness does not occur with standing, lying will sometimes make the dizziness worse. His bp runs 130's/60's. He is c/o increased fatigue. He is scheduled for testing next week but is concerned by his symptoms and hx of stroke. Will forward for dr Stanford Breed review

## 2014-11-01 ENCOUNTER — Telehealth (HOSPITAL_COMMUNITY): Payer: Self-pay | Admitting: *Deleted

## 2014-11-01 NOTE — Telephone Encounter (Signed)
Patient given detailed instructions per Myocardial Perfusion Study Information Sheet for test on 11/07/14 at 0930. Patient notified to arrive 15 minutes early and that it is imperative to arrive on time for appointment to keep from having the test rescheduled.  If you need to cancel or reschedule your appointment, please call the office within 24 hours of your appointment. Failure to do so may result in a cancellation of your appointment, and a $50 no show fee. Patient verbalized understanding. Charnice Zwilling, Ranae Palms

## 2014-11-07 ENCOUNTER — Ambulatory Visit (HOSPITAL_COMMUNITY): Payer: Medicare Other | Attending: Cardiology

## 2014-11-07 ENCOUNTER — Other Ambulatory Visit (INDEPENDENT_AMBULATORY_CARE_PROVIDER_SITE_OTHER): Payer: Medicare Other | Admitting: *Deleted

## 2014-11-07 ENCOUNTER — Other Ambulatory Visit: Payer: Self-pay

## 2014-11-07 ENCOUNTER — Ambulatory Visit (HOSPITAL_BASED_OUTPATIENT_CLINIC_OR_DEPARTMENT_OTHER): Payer: Medicare Other

## 2014-11-07 DIAGNOSIS — I350 Nonrheumatic aortic (valve) stenosis: Secondary | ICD-10-CM | POA: Diagnosis not present

## 2014-11-07 DIAGNOSIS — E785 Hyperlipidemia, unspecified: Secondary | ICD-10-CM | POA: Insufficient documentation

## 2014-11-07 DIAGNOSIS — I2510 Atherosclerotic heart disease of native coronary artery without angina pectoris: Secondary | ICD-10-CM | POA: Diagnosis not present

## 2014-11-07 DIAGNOSIS — Z8673 Personal history of transient ischemic attack (TIA), and cerebral infarction without residual deficits: Secondary | ICD-10-CM | POA: Diagnosis not present

## 2014-11-07 DIAGNOSIS — I10 Essential (primary) hypertension: Secondary | ICD-10-CM | POA: Diagnosis not present

## 2014-11-07 DIAGNOSIS — I739 Peripheral vascular disease, unspecified: Secondary | ICD-10-CM | POA: Diagnosis not present

## 2014-11-07 DIAGNOSIS — R0602 Shortness of breath: Secondary | ICD-10-CM | POA: Diagnosis not present

## 2014-11-07 DIAGNOSIS — Z8249 Family history of ischemic heart disease and other diseases of the circulatory system: Secondary | ICD-10-CM | POA: Insufficient documentation

## 2014-11-07 DIAGNOSIS — R079 Chest pain, unspecified: Secondary | ICD-10-CM | POA: Diagnosis not present

## 2014-11-07 LAB — MYOCARDIAL PERFUSION IMAGING
CHL CUP RESTING HR STRESS: 63 {beats}/min
LV sys vol: 29 mL
LVDIAVOL: 92 mL
NUC STRESS TID: 0.96
Peak HR: 101 {beats}/min
RATE: 0.28
SDS: 1
SRS: 3
SSS: 4

## 2014-11-07 LAB — BASIC METABOLIC PANEL
BUN: 24 mg/dL — ABNORMAL HIGH (ref 6–23)
CHLORIDE: 104 meq/L (ref 96–112)
CO2: 31 meq/L (ref 19–32)
CREATININE: 1.09 mg/dL (ref 0.40–1.50)
Calcium: 9.6 mg/dL (ref 8.4–10.5)
GFR: 70.65 mL/min (ref >60.00)
Glucose, Bld: 115 mg/dL — ABNORMAL HIGH (ref 70–99)
Potassium: 4.1 mEq/L (ref 3.5–5.1)
Sodium: 140 mEq/L (ref 135–145)

## 2014-11-07 LAB — HEPATIC FUNCTION PANEL
ALBUMIN: 4.1 g/dL (ref 3.5–5.2)
ALK PHOS: 78 U/L (ref 39–117)
ALT: 33 U/L (ref 0–53)
AST: 17 U/L (ref 0–37)
Bilirubin, Direct: 0.1 mg/dL (ref 0.0–0.3)
Total Bilirubin: 0.7 mg/dL (ref 0.2–1.2)
Total Protein: 6.8 g/dL (ref 6.0–8.3)

## 2014-11-07 LAB — LIPID PANEL
CHOL/HDL RATIO: 4
Cholesterol: 128 mg/dL (ref 0–200)
HDL: 32.6 mg/dL — AB (ref >39.00)
LDL Cholesterol: 70 mg/dL (ref 0–99)
NONHDL: 94.94
Triglycerides: 124 mg/dL (ref 0.0–149.0)
VLDL: 24.8 mg/dL (ref 0.0–40.0)

## 2014-11-07 MED ORDER — TECHNETIUM TC 99M SESTAMIBI GENERIC - CARDIOLITE
31.7000 | Freq: Once | INTRAVENOUS | Status: AC | PRN
  Administered 2014-11-07: 31.7 via INTRAVENOUS

## 2014-11-07 MED ORDER — REGADENOSON 0.4 MG/5ML IV SOLN
0.4000 mg | Freq: Once | INTRAVENOUS | Status: AC
  Administered 2014-11-07: 0.4 mg via INTRAVENOUS

## 2014-11-07 MED ORDER — TECHNETIUM TC 99M SESTAMIBI GENERIC - CARDIOLITE
10.9000 | Freq: Once | INTRAVENOUS | Status: AC | PRN
  Administered 2014-11-07: 10.9 via INTRAVENOUS

## 2014-11-13 ENCOUNTER — Ambulatory Visit (INDEPENDENT_AMBULATORY_CARE_PROVIDER_SITE_OTHER): Payer: Medicare Other | Admitting: Internal Medicine

## 2014-11-13 ENCOUNTER — Encounter: Payer: Self-pay | Admitting: Internal Medicine

## 2014-11-13 VITALS — BP 138/70 | HR 80 | Wt 199.0 lb

## 2014-11-13 DIAGNOSIS — F419 Anxiety disorder, unspecified: Secondary | ICD-10-CM

## 2014-11-13 DIAGNOSIS — I10 Essential (primary) hypertension: Secondary | ICD-10-CM | POA: Diagnosis not present

## 2014-11-13 DIAGNOSIS — E119 Type 2 diabetes mellitus without complications: Secondary | ICD-10-CM

## 2014-11-13 DIAGNOSIS — I639 Cerebral infarction, unspecified: Secondary | ICD-10-CM | POA: Diagnosis not present

## 2014-11-13 DIAGNOSIS — I2510 Atherosclerotic heart disease of native coronary artery without angina pectoris: Secondary | ICD-10-CM | POA: Diagnosis not present

## 2014-11-13 DIAGNOSIS — I6350 Cerebral infarction due to unspecified occlusion or stenosis of unspecified cerebral artery: Secondary | ICD-10-CM

## 2014-11-13 DIAGNOSIS — M5440 Lumbago with sciatica, unspecified side: Secondary | ICD-10-CM

## 2014-11-13 DIAGNOSIS — Z23 Encounter for immunization: Secondary | ICD-10-CM

## 2014-11-13 DIAGNOSIS — E538 Deficiency of other specified B group vitamins: Secondary | ICD-10-CM | POA: Diagnosis not present

## 2014-11-13 MED ORDER — CLONAZEPAM 0.5 MG PO TABS: 0.5000 mg | ORAL_TABLET | Freq: Three times a day (TID) | ORAL | Status: DC | PRN

## 2014-11-13 MED ORDER — LOSARTAN POTASSIUM-HCTZ 100-12.5 MG PO TABS: 1.0000 | ORAL_TABLET | Freq: Every day | ORAL | Status: DC

## 2014-11-13 NOTE — Assessment & Plan Note (Signed)
Plavix,Losartan HCT, Lipitor, ASA

## 2014-11-13 NOTE — Progress Notes (Signed)
Pre visit review using our clinic review tool, if applicable. No additional management support is needed unless otherwise documented below in the visit note. 

## 2014-11-13 NOTE — Assessment & Plan Note (Signed)
  On diet  

## 2014-11-13 NOTE — Assessment & Plan Note (Signed)
Klonopin prn  Potential benefits of a long term benzodiazepines  use as well as potential risks  and complications were explained to the patient and were aknowledged. 

## 2014-11-13 NOTE — Assessment & Plan Note (Signed)
On B12 

## 2014-11-13 NOTE — Assessment & Plan Note (Signed)
On Losartan HCT 

## 2014-11-13 NOTE — Assessment & Plan Note (Signed)
Norco prn  Potential benefits of a long term opioids use as well as potential risks (i.e. addiction risk, apnea etc) and complications (i.e. Somnolence, constipation and others) were explained to the patient and were aknowledged. 

## 2014-11-13 NOTE — Progress Notes (Signed)
Subjective:  [name]: Andrew Mechanic., male    DOB: 1940/06/11  Age: 74 y.o. MRN: [id number]  CC: No chief complaint on file.   HPI Andrew WOLLMAN Sr. presents for LBP, neck pain, HTN  Outpatient Prescriptions Prior to Visit  Medication Sig Dispense Refill  . acyclovir (ZOVIRAX) 200 MG capsule Take 1 capsule (200 mg total) by mouth 2 (two) times daily. 60 capsule 3  . aspirin EC 81 MG tablet Take 81 mg by mouth daily.     Marland Kitchen atorvastatin (LIPITOR) 20 MG tablet take 1 tablet by mouth once daily 30 tablet 5  . citalopram (CELEXA) 10 MG tablet take 1 tablet by mouth once daily 90 tablet 2  . clonazePAM (KLONOPIN) 0.5 MG tablet Take 1 tablet (0.5 mg total) by mouth every 8 (eight) hours. 90 tablet 2  . clopidogrel (PLAVIX) 75 MG tablet take 1 tablet by mouth once daily 30 tablet 11  . Cyanocobalamin (VITAMIN B-12) 1000 MCG SUBL Place 1 tablet (1,000 mcg total) under the tongue daily. 100 tablet 3  . fluticasone (FLONASE) 50 MCG/ACT nasal spray Place 2 sprays into both nostrils daily. 16 g 6  . HYDROcodone-acetaminophen (NORCO) 5-325 MG per tablet Take 1-2 tablets by mouth every 6 (six) hours as needed for moderate pain or severe pain. 100 tablet 0  . loratadine (CLARITIN) 10 MG tablet Take 1 tablet (10 mg total) by mouth daily. 100 tablet 3  . losartan-hydrochlorothiazide (HYZAAR) 100-12.5 MG per tablet Take 1 tablet by mouth daily. 90 tablet 3  . pantoprazole (PROTONIX) 40 MG tablet TAKE ONE TABLET BY MOUTH ONCE DAILY 30 tablet 5  . Tamsulosin HCl (FLOMAX) 0.4 MG CAPS Take 1 capsule by mouth at bedtime.    . traMADol (ULTRAM) 50 MG tablet Take 1 tablet by mouth as needed.  0  . losartan (COZAAR) 50 MG tablet Take 1 tablet by mouth daily.  1  . naproxen (NAPROSYN) 500 MG tablet Take 1 tablet (500 mg total) by mouth 2 (two) times daily with a meal. (Patient not taking: Reported on 11/13/2014) 30 tablet 0   No facility-administered medications prior to visit.    ROS Review of  Systems  Objective:  BP 138/70 mmHg  Pulse 80  Wt 199 lb (90.266 kg)  SpO2 97%  BP Readings from Last 3 Encounters:  11/13/14 138/70  10/19/14 148/62  08/14/14 132/78    Wt Readings from Last 3 Encounters:  11/13/14 199 lb (90.266 kg)  11/07/14 198 lb (89.812 kg)  10/19/14 198 lb 14.4 oz (90.22 kg)    Physical Exam  Lab Results  Component Value Date   WBC 4.6 10/16/2013   HGB 12.9* 10/16/2013   HCT 38.5* 10/16/2013   PLT 189.0 10/16/2013   GLUCOSE 115* 11/07/2014   CHOL 128 11/07/2014   TRIG 124.0 11/07/2014   HDL 32.60* 11/07/2014   LDLDIRECT 89.5 06/27/2012   LDLCALC 70 11/07/2014   ALT 33 11/07/2014   AST 17 11/07/2014   NA 140 11/07/2014   K 4.1 11/07/2014   CL 104 11/07/2014   CREATININE 1.09 11/07/2014   BUN 24* 11/07/2014   CO2 31 11/07/2014   TSH 1.08 10/24/2012   PSA 0.66 09/06/2007   INR 0.95 08/05/2011   HGBA1C 6.3 10/24/2012    Dg Lumbar Spine Complete  07/16/2014   CLINICAL DATA:  Low back twisting injury 2 weeks ago. Worsening pain the past few days.  EXAM: LUMBAR SPINE - COMPLETE 4+ VIEW  COMPARISON:  MRI 08/04/2012  FINDINGS: Bilateral L5 pars defects appear to be present on the lateral view. Slight anterolisthesis of L5 on S1, approximately 6 mm. No fracture. Disc spaces are maintained. SI joints are symmetric and unremarkable.  IMPRESSION: No acute bony abnormality.  Bilateral L5 pars defects with grade 1 anterolisthesis.   Electronically Signed   By: Rolm Baptise M.D.   On: 07/16/2014 12:46    Assessment & Plan:   There are no diagnoses linked to this encounter. I have discontinued Andrew Malone's losartan. I am also having him maintain his tamsulosin, aspirin EC, Vitamin B-12, fluticasone, loratadine, pantoprazole, citalopram, clonazePAM, atorvastatin, losartan-hydrochlorothiazide, naproxen, HYDROcodone-acetaminophen, clopidogrel, acyclovir, and traMADol.  No orders of the defined types were placed in this encounter.     Follow-up: No  Follow-up on file.  Walker Kehr, MD

## 2014-11-19 ENCOUNTER — Encounter: Payer: Self-pay | Admitting: Pulmonary Disease

## 2014-11-19 ENCOUNTER — Ambulatory Visit (INDEPENDENT_AMBULATORY_CARE_PROVIDER_SITE_OTHER): Payer: Medicare Other | Admitting: Pulmonary Disease

## 2014-11-19 VITALS — BP 138/78 | HR 83 | Ht 70.5 in | Wt 197.8 lb

## 2014-11-19 DIAGNOSIS — I639 Cerebral infarction, unspecified: Secondary | ICD-10-CM | POA: Diagnosis not present

## 2014-11-19 DIAGNOSIS — G4733 Obstructive sleep apnea (adult) (pediatric): Secondary | ICD-10-CM | POA: Diagnosis not present

## 2014-11-19 DIAGNOSIS — F518 Other sleep disorders not due to a substance or known physiological condition: Secondary | ICD-10-CM | POA: Insufficient documentation

## 2014-11-19 DIAGNOSIS — Z9989 Dependence on other enabling machines and devices: Principal | ICD-10-CM

## 2014-11-19 NOTE — Progress Notes (Signed)
Chief Complaint  Patient presents with  . Follow-up    pt states he has a lot of trouble staying asleep. he wakes up several times during the night. pt using CPAP every night for about 5 hours a night some nights more. pt states the mask and pressure is good. DME: [facility]    History of Present Illness: Andrew Malone. is a 74 y.o. male with OSA.  He has been doing well with CPAP.  He doesn't have any issues with his mask.    He only gets about 5 hours of sleep per night.  He feels this is enough, and does not feel sleepy during the day.  He recently retired, and is adjusting his lifestyle since he has more free time.  TESTS: PSG 09/14/10>>AHI 8, SpO2 low 84% Auto CPAP 02/18/12 to 05/17/12 >> Used on 60 of 90 nights with average 6 hrs 32 min.  Average AHI 0.6 with median CPAP 6 cm H2O and 95th percentile CPAP 8 cm H2O. Echo 06/30/12 >> EF 55 to 44%, grade 1 diastolic dysfx, mild AS, PAS 31 mmHg Auto CPAP 07/15/14 to 08/13/14 >> used on 30 of 20 nights with average 5 hrs and 41 min.  Average AHI is 1.1 with median CPAP 6 cm H2O and 95 th percentile CPAP 9 cm H20. Echo 11/07/14 >> EF 55 to 60%, mild LVH, grade 1 diastolic dysfx, mild AS  PMHx >> CAD, Carotid artery disease, CVA, HLD, HTN, GERD, GN, HH, PUD, chronic back pain, Anxiety, Aortic stenosis  PSHx, Medications, Allergies, Fhx, Shx reviewed.  Physical Exam: BP 138/78 mmHg  Pulse 83  Ht 5' 10.5" (1.791 m)  Wt 197 lb 12.8 oz (89.721 kg)  BMI 27.97 kg/m2  SpO2 97%  General - No distress ENT - No sinus tenderness, no oral exudate, no LAN, MP 3 Cardiac - s1s2 regular, 2/6 SM Chest - No wheeze/rales/dullness Back - No focal tenderness Abd - Soft, non-tender Ext - No edema Neuro - Normal strength Skin - No rashes Psych - normal mood, and behavior   Assessment/Plan:  Obstructive sleep apnea. He is compliant with CPAP and reports benefit. Plan: - continue auto CPAP  Short sleeper. Plan: - Reviewed proper expectations  from sleep.   Chesley Mires, MD [facility] Pager:  [phone] 11/19/2014, 2:40 PM

## 2014-11-19 NOTE — Patient Instructions (Signed)
Follow up in 1 year.

## 2014-12-24 ENCOUNTER — Other Ambulatory Visit: Payer: Self-pay | Admitting: Internal Medicine
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
## 2014-12-27 ENCOUNTER — Encounter: Payer: Self-pay | Admitting: Cardiology

## 2014-12-27 ENCOUNTER — Ambulatory Visit (INDEPENDENT_AMBULATORY_CARE_PROVIDER_SITE_OTHER): Payer: Medicare Other | Admitting: Cardiology

## 2014-12-27 VITALS — BP 122/68 | HR 85 | Ht 70.5 in | Wt 202.8 lb

## 2014-12-27 DIAGNOSIS — E785 Hyperlipidemia, unspecified: Secondary | ICD-10-CM | POA: Diagnosis not present

## 2014-12-27 DIAGNOSIS — I10 Essential (primary) hypertension: Secondary | ICD-10-CM | POA: Diagnosis not present

## 2014-12-27 DIAGNOSIS — I6350 Cerebral infarction due to unspecified occlusion or stenosis of unspecified cerebral artery: Secondary | ICD-10-CM

## 2014-12-27 DIAGNOSIS — I2510 Atherosclerotic heart disease of native coronary artery without angina pectoris: Secondary | ICD-10-CM

## 2014-12-27 DIAGNOSIS — R072 Precordial pain: Secondary | ICD-10-CM | POA: Diagnosis not present

## 2014-12-27 DIAGNOSIS — I350 Nonrheumatic aortic (valve) stenosis: Secondary | ICD-10-CM

## 2014-12-27 NOTE — Assessment & Plan Note (Signed)
Continue statin. 

## 2014-12-27 NOTE — Assessment & Plan Note (Signed)
Follow-up carotid Dopplers January 2017.

## 2014-12-27 NOTE — Assessment & Plan Note (Signed)
Aortic stenosis is mild on echocardiogram. It sounds worse on physical examination. However his symptoms have improved. He will need follow-up echoes in the future. I will see him back in 3 months to make sure that his symptoms are stable.

## 2014-12-27 NOTE — Assessment & Plan Note (Signed)
Blood pressure controlled. Continue present medications. 

## 2014-12-27 NOTE — Assessment & Plan Note (Signed)
No further symptoms. Nuclear study showed no ischemia. Plan medical therapy for now. Low threshold for catheterization if he has recurrent symptoms.

## 2014-12-27 NOTE — Patient Instructions (Signed)
Your physician recommends that you schedule a follow-up appointment in: 3 MONTHS WITH DR CRENSHAW  

## 2014-12-28 DIAGNOSIS — L821 Other seborrheic keratosis: Secondary | ICD-10-CM | POA: Diagnosis not present

## 2014-12-28 DIAGNOSIS — L570 Actinic keratosis: Secondary | ICD-10-CM | POA: Diagnosis not present

## 2014-12-28 DIAGNOSIS — L814 Other melanin hyperpigmentation: Secondary | ICD-10-CM | POA: Diagnosis not present

## 2014-12-28 DIAGNOSIS — D1801 Hemangioma of skin and subcutaneous tissue: Secondary | ICD-10-CM | POA: Diagnosis not present

## 2014-12-28 DIAGNOSIS — L820 Inflamed seborrheic keratosis: Secondary | ICD-10-CM | POA: Diagnosis not present

## 2014-12-28 DIAGNOSIS — Z85828 Personal history of other malignant neoplasm of skin: Secondary | ICD-10-CM | POA: Diagnosis not present

## 2015-01-03 ENCOUNTER — Other Ambulatory Visit: Payer: Self-pay | Admitting: Internal Medicine

## 2015-01-15 ENCOUNTER — Other Ambulatory Visit: Payer: Self-pay | Admitting: Radiology

## 2015-01-15 DIAGNOSIS — I6529 Occlusion and stenosis of unspecified carotid artery: Secondary | ICD-10-CM

## 2015-01-21 ENCOUNTER — Ambulatory Visit (HOSPITAL_COMMUNITY): Admission: RE | Admit: 2015-01-21 | Payer: Medicare Other | Source: Ambulatory Visit

## 2015-01-25 ENCOUNTER — Ambulatory Visit (HOSPITAL_COMMUNITY)
Admission: RE | Admit: 2015-01-25 | Discharge: 2015-01-25 | Disposition: A | Discharge status: Home / Self Care | Payer: Medicare Other | Source: Ambulatory Visit | Attending: Radiology | Admitting: Radiology

## 2015-01-25 DIAGNOSIS — I6529 Occlusion and stenosis of unspecified carotid artery: Secondary | ICD-10-CM | POA: Diagnosis not present

## 2015-01-25 DIAGNOSIS — I6523 Occlusion and stenosis of bilateral carotid arteries: Secondary | ICD-10-CM | POA: Insufficient documentation

## 2015-01-25 NOTE — Progress Notes (Signed)
Preliminary results by tech - Carotid Duplex Completed. Patent right ICA stent with a 1-39% stenosis. Left ICA open and patent with a 1-39% stenosis. Bilateral vertebral arteries demonstrate antegrade flow. Oda Cogan, BS, RDMS, RVT

## 2015-01-29 ENCOUNTER — Telehealth (HOSPITAL_COMMUNITY): Payer: Self-pay

## 2015-01-29 NOTE — Telephone Encounter (Signed)
Called and informed pt to follow up with US Carotid in 6 months per Dr. Estanislado Pandy. Pt agreed with this plan. AW

## 2015-01-31 ENCOUNTER — Telehealth: Payer: Self-pay

## 2015-01-31 NOTE — Telephone Encounter (Signed)
Call to schedule AWV; Agreed to schedule 12/8 at 8am;

## 2015-02-07 ENCOUNTER — Ambulatory Visit (INDEPENDENT_AMBULATORY_CARE_PROVIDER_SITE_OTHER): Payer: Medicare Other

## 2015-02-07 VITALS — BP 140/80 | Ht 70.0 in | Wt 206.0 lb

## 2015-02-07 DIAGNOSIS — Z0000 Encounter for general adult medical examination without abnormal findings: Secondary | ICD-10-CM | POA: Diagnosis not present

## 2015-02-07 NOTE — Patient Instructions (Addendum)
Andrew Malone , Thank you for taking time to come for your Medicare Wellness Visit. I appreciate your ongoing commitment to your health goals. Please review the following plan we discussed and let me know if I can assist you in the future.   Educated to check with insurance regarding coverage of Shingles vaccination on Part D or Part B and may have lower co-pay if provided on the Part D side  Will have A1c and microablumin with Dr. Camila Li  These are the goals we discussed: Goals    . eat more healthy     To eat more healthy; Bought a "air fryer" eat fat free;     . Exercise 150 minutes per week (moderate activity)     To start walking with wife x 5 days a week        This is a list of the screening recommended for you and due dates:  Health Maintenance  Topic Date Due  . Complete foot exam   09/21/1950  . Eye exam for diabetics  09/21/1950  . Urine Protein Check  09/21/1950  . Shingles Vaccine  09/22/2002  . Hemoglobin A1C  04/26/2013  . Flu Shot  10/01/2015  . Colon Cancer Screening  05/19/2016  . Tetanus Vaccine  10/17/2018  . Pneumonia vaccines  Completed     Fall Prevention in the Home  Falls can cause injuries. They can happen to people of all ages. There are many things you can do to make your home safe and to help prevent falls.  WHAT CAN I DO ON THE OUTSIDE OF MY HOME?  Regularly fix the edges of walkways and driveways and fix any cracks.  Remove anything that might make you trip as you walk through a door, such as a raised step or threshold.  Trim any bushes or trees on the path to your home.  Use bright outdoor lighting.  Clear any walking paths of anything that might make someone trip, such as rocks or tools.  Regularly check to see if handrails are loose or broken. Make sure that both sides of any steps have handrails.  Any raised decks and porches should have guardrails on the edges.  Have any leaves, snow, or ice cleared regularly.  Use sand or salt on  walking paths during winter.  Clean up any spills in your garage right away. This includes oil or grease spills. WHAT CAN I DO IN THE BATHROOM?   Use night lights.  Install grab bars by the toilet and in the tub and shower. Do not use towel bars as grab bars.  Use non-skid mats or decals in the tub or shower.  If you need to sit down in the shower, use a plastic, non-slip stool.  Keep the floor dry. Clean up any water that spills on the floor as soon as it happens.  Remove soap buildup in the tub or shower regularly.  Attach bath mats securely with double-sided non-slip rug tape.  Do not have throw rugs and other things on the floor that can make you trip. WHAT CAN I DO IN THE BEDROOM?  Use night lights.  Make sure that you have a light by your bed that is easy to reach.  Do not use any sheets or blankets that are too big for your bed. They should not hang down onto the floor.  Have a firm chair that has side arms. You can use this for support while you get dressed.  Do not have  throw rugs and other things on the floor that can make you trip. WHAT CAN I DO IN THE KITCHEN?  Clean up any spills right away.  Avoid walking on wet floors.  Keep items that you use a lot in easy-to-reach places.  If you need to reach something above you, use a strong step stool that has a grab bar.  Keep electrical cords out of the way.  Do not use floor polish or wax that makes floors slippery. If you must use wax, use non-skid floor wax.  Do not have throw rugs and other things on the floor that can make you trip. WHAT CAN I DO WITH MY STAIRS?  Do not leave any items on the stairs.  Make sure that there are handrails on both sides of the stairs and use them. Fix handrails that are broken or loose. Make sure that handrails are as long as the stairways.  Check any carpeting to make sure that it is firmly attached to the stairs. Fix any carpet that is loose or worn.  Avoid having throw  rugs at the top or bottom of the stairs. If you do have throw rugs, attach them to the floor with carpet tape.  Make sure that you have a light switch at the top of the stairs and the bottom of the stairs. If you do not have them, ask someone to add them for you. WHAT ELSE CAN I DO TO HELP PREVENT FALLS?  Wear shoes that:  Do not have high heels.  Have rubber bottoms.  Are comfortable and fit you well.  Are closed at the toe. Do not wear sandals.  If you use a stepladder:  Make sure that it is fully opened. Do not climb a closed stepladder.  Make sure that both sides of the stepladder are locked into place.  Ask someone to hold it for you, if possible.  Clearly mark and make sure that you can see:  Any grab bars or handrails.  First and last steps.  Where the edge of each step is.  Use tools that help you move around (mobility aids) if they are needed. These include:  Canes.  Walkers.  Scooters.  Crutches.  Turn on the lights when you go into a dark area. Replace any light bulbs as soon as they burn out.  Set up your furniture so you have a clear path. Avoid moving your furniture around.  If any of your floors are uneven, fix them.  If there are any pets around you, be aware of where they are.  Review your medicines with your doctor. Some medicines can make you feel dizzy. This can increase your chance of falling. Ask your doctor what other things that you can do to help prevent falls.   This information is not intended to replace advice given to you by your health care provider. Make sure you discuss any questions you have with your health care provider.   Document Released: 12/13/2008 Document Revised: 07/03/2014 Document Reviewed: 03/23/2014 Elsevier Interactive Patient Education 2016 [place] Maintenance, Male A healthy lifestyle and preventative care can promote health and wellness.  Maintain regular health, dental, and eye exams.  Eat  a healthy diet. Foods like vegetables, fruits, whole grains, low-fat dairy products, and lean protein foods contain the nutrients you need and are low in calories. Decrease your intake of foods high in solid fats, added sugars, and salt. Get information about a proper diet from your health care  provider, if necessary.  Regular physical exercise is one of the most important things you can do for your health. Most adults should get at least 150 minutes of moderate-intensity exercise (any activity that increases your heart rate and causes you to sweat) each week. In addition, most adults need muscle-strengthening exercises on 2 or more days a week.   Maintain a healthy weight. The body mass index (BMI) is a screening tool to identify possible weight problems. It provides an estimate of body fat based on height and weight. Your health care provider can find your BMI and can help you achieve or maintain a healthy weight. For males 20 years and older:  A BMI below 18.5 is considered underweight.  A BMI of 18.5 to 24.9 is normal.  A BMI of 25 to 29.9 is considered overweight.  A BMI of 30 and above is considered obese.  Maintain normal blood lipids and cholesterol by exercising and minimizing your intake of saturated fat. Eat a balanced diet with plenty of fruits and vegetables. Blood tests for lipids and cholesterol should begin at age 76 and be repeated every 5 years. If your lipid or cholesterol levels are high, you are over age 45, or you are at high risk for heart disease, you may need your cholesterol levels checked more frequently.Ongoing high lipid and cholesterol levels should be treated with medicines if diet and exercise are not working.  If you smoke, find out from your health care provider how to quit. If you do not use tobacco, do not start.  Lung cancer screening is recommended for adults aged 14-80 years who are at high risk for developing lung cancer because of a history of smoking. A  yearly low-dose CT scan of the lungs is recommended for people who have at least a 30-pack-year history of smoking and are current smokers or have quit within the past 15 years. A pack year of smoking is smoking an average of 1 pack of cigarettes a day for 1 year (for example, a 30-pack-year history of smoking could mean smoking 1 pack a day for 30 years or 2 packs a day for 15 years). Yearly screening should continue until the smoker has stopped smoking for at least 15 years. Yearly screening should be stopped for people who develop a health problem that would prevent them from having lung cancer treatment.  If you choose to drink alcohol, do not have more than 2 drinks per day. One drink is considered to be 12 oz (360 mL) of beer, 5 oz (150 mL) of wine, or 1.5 oz (45 mL) of liquor.  Avoid the use of street drugs. Do not share needles with anyone. Ask for help if you need support or instructions about stopping the use of drugs.  High blood pressure causes heart disease and increases the risk of stroke. High blood pressure is more likely to develop in:  People who have blood pressure in the end of the normal range (100-139/85-89 mm Hg).  People who are overweight or obese.  People who are African American.  If you are 43-29 years of age, have your blood pressure checked every 3-5 years. If you are 77 years of age or older, have your blood pressure checked every year. You should have your blood pressure measured twice--once when you are at a hospital or clinic, and once when you are not at a hospital or clinic. Record the average of the two measurements. To check your blood pressure when you are  not at a hospital or clinic, you can use:  An automated blood pressure machine at a pharmacy.  A home blood pressure monitor.  If you are 75-25 years old, ask your health care provider if you should take aspirin to prevent heart disease.  Diabetes screening involves taking a blood sample to check your  fasting blood sugar level. This should be done once every 3 years after age 8 if you are at a normal weight and without risk factors for diabetes. Testing should be considered at a younger age or be carried out more frequently if you are overweight and have at least 1 risk factor for diabetes.  Colorectal cancer can be detected and often prevented. Most routine colorectal cancer screening begins at the age of 94 and continues through age 21. However, your health care provider may recommend screening at an earlier age if you have risk factors for colon cancer. On a yearly basis, your health care provider may provide home test kits to check for hidden blood in the stool. A small camera at the end of a tube may be used to directly examine the colon (sigmoidoscopy or colonoscopy) to detect the earliest forms of colorectal cancer. Talk to your health care provider about this at age 60 when routine screening begins. A direct exam of the colon should be repeated every 5-10 years through age 57, unless early forms of precancerous polyps or small growths are found.  People who are at an increased risk for hepatitis B should be screened for this virus. You are considered at high risk for hepatitis B if:  You were born in a country where hepatitis B occurs often. Talk with your health care provider about which countries are considered high risk.  Your parents were born in a high-risk country and you have not received a shot to protect against hepatitis B (hepatitis B vaccine).  You have HIV or AIDS.  You use needles to inject street drugs.  You live with, or have sex with, someone who has hepatitis B.  You are a man who has sex with other men (MSM).  You get hemodialysis treatment.  You take certain medicines for conditions like cancer, organ transplantation, and autoimmune conditions.  Hepatitis C blood testing is recommended for all people born from [birth year] through 1963 and any individual with known risk  factors for hepatitis C.  Healthy men should no longer receive prostate-specific antigen (PSA) blood tests as part of routine cancer screening. Talk to your health care provider about prostate cancer screening.  Testicular cancer screening is not recommended for adolescents or adult males who have no symptoms. Screening includes self-exam, a health care provider exam, and other screening tests. Consult with your health care provider about any symptoms you have or any concerns you have about testicular cancer.  Practice safe sex. Use condoms and avoid high-risk sexual practices to reduce the spread of sexually transmitted infections (STIs).  You should be screened for STIs, including gonorrhea and chlamydia if:  You are sexually active and are younger than 24 years.  You are older than 24 years, and your health care provider tells you that you are at risk for this type of infection.  Your sexual activity has changed since you were last screened, and you are at an increased risk for chlamydia or gonorrhea. Ask your health care provider if you are at risk.  If you are at risk of being infected with HIV, it is recommended that you take a prescription  medicine daily to prevent HIV infection. This is called pre-exposure prophylaxis (PrEP). You are considered at risk if:  You are a man who has sex with other men (MSM).  You are a heterosexual man who is sexually active with multiple partners.  You take drugs by injection.  You are sexually active with a partner who has HIV.  Talk with your health care provider about whether you are at high risk of being infected with HIV. If you choose to begin PrEP, you should first be tested for HIV. You should then be tested every 3 months for as long as you are taking PrEP.  Use sunscreen. Apply sunscreen liberally and repeatedly throughout the day. You should seek shade when your shadow is shorter than you. Protect yourself by wearing long sleeves, pants, a  wide-brimmed hat, and sunglasses year round whenever you are outdoors.  Tell your health care provider of new moles or changes in moles, especially if there is a change in shape or color. Also, tell your health care provider if a mole is larger than the size of a pencil eraser.  A one-time screening for abdominal aortic aneurysm (AAA) and surgical repair of large AAAs by ultrasound is recommended for men aged 81-75 years who are current or former smokers.  Stay current with your vaccines (immunizations).   This information is not intended to replace advice given to you by your health care provider. Make sure you discuss any questions you have with your health care provider.   Document Released: 08/15/2007 Document Revised: 03/09/2014 Document Reviewed: 07/14/2010 Elsevier Interactive Patient Education 2016 [place] you for enrolling in [employer]. Please follow the instructions below to securely access your online medical record. MyChart allows you to send messages to your doctor, view your test results, manage appointments, and more.   How Do I Sign Up? 1. In your Internet browser, go to [employer] and enter [email or web address]. 2. Click on the Sign Up Now link in the Sign In box. You will see the New Member Sign Up page. 3. Enter your MyChart Access Code exactly as it appears below. You will not need to use this code after you've completed the sign-up process. If you do not sign up before the expiration date, you must request a new code.  MyChart Access Code: [email or web address] Expires: 02/25/2015  7:53 AM  4. Enter your Social Security Number ([id number]) and Date of Birth (mm/dd/yyyy) as indicated and click Submit. You will be taken to the next sign-up page. 5. Create a MyChart ID. This will be your MyChart login ID and cannot be changed, so think of one that is secure and easy to remember. 6. Create a MyChart password. You can change your password at any  time. 7. Enter your Password Reset Question and Answer. This can be used at a later time if you forget your password.  8. Enter your e-mail address. You will receive e-mail notification when new information is available in [place]. 9. Click Sign Up. You can now view your medical record.   Additional Information Remember, MyChart is NOT to be used for urgent needs. For medical emergencies, dial 911.
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
## 2015-02-08 ENCOUNTER — Encounter: Payer: Self-pay | Admitting: Cardiology

## 2015-02-12 ENCOUNTER — Telehealth (HOSPITAL_COMMUNITY): Payer: Self-pay

## 2015-02-12 NOTE — Telephone Encounter (Signed)
Called to inform pt that Dr. Estanislado Pandy wants him to follow up with an US Carotid in 6 months. Pt agreed with this plan. AW

## 2015-03-05 ENCOUNTER — Other Ambulatory Visit: Payer: Self-pay | Admitting: Internal Medicine
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
## 2015-03-12 ENCOUNTER — Encounter: Payer: Medicare Other | Admitting: Cardiology

## 2015-03-15 ENCOUNTER — Ambulatory Visit (INDEPENDENT_AMBULATORY_CARE_PROVIDER_SITE_OTHER): Payer: Medicare Other | Admitting: Internal Medicine

## 2015-03-15 ENCOUNTER — Encounter: Payer: Self-pay | Admitting: Internal Medicine

## 2015-03-15 VITALS — BP 140/64 | HR 90 | Wt 206.0 lb

## 2015-03-15 DIAGNOSIS — I2510 Atherosclerotic heart disease of native coronary artery without angina pectoris: Secondary | ICD-10-CM

## 2015-03-15 DIAGNOSIS — I10 Essential (primary) hypertension: Secondary | ICD-10-CM

## 2015-03-15 DIAGNOSIS — F329 Major depressive disorder, single episode, unspecified: Secondary | ICD-10-CM

## 2015-03-15 DIAGNOSIS — F32A Depression, unspecified: Secondary | ICD-10-CM

## 2015-03-15 DIAGNOSIS — E538 Deficiency of other specified B group vitamins: Secondary | ICD-10-CM

## 2015-03-15 NOTE — Patient Instructions (Signed)
Take Citalopram at night

## 2015-03-15 NOTE — Assessment & Plan Note (Signed)
Plavix,Losartan HCT, Lipitor, ASA 

## 2015-03-15 NOTE — Progress Notes (Signed)
Pre visit review using our clinic review tool, if applicable. No additional management support is needed unless otherwise documented below in the visit note. 

## 2015-03-15 NOTE — Assessment & Plan Note (Signed)
On B12 

## 2015-03-15 NOTE — Assessment & Plan Note (Signed)
Losartan HCT 

## 2015-03-15 NOTE — Assessment & Plan Note (Signed)
Try Citalopram at HS

## 2015-03-15 NOTE — Progress Notes (Signed)
Subjective:  [name]: Andrew Mechanic., male    DOB: [birth year]  Age: 75 y.o. MRN: [id number]  CC: No chief complaint on file.   HPI Andrew KOEPKE Sr. presents for dyslipidemia, HTN, CVA, CAD. C/o fatigue x long time   Outpatient Prescriptions Prior to Visit  Medication Sig Dispense Refill  . acyclovir (ZOVIRAX) 200 MG capsule Take 1 capsule (200 mg total) by mouth 2 (two) times daily. 60 capsule 3  . atorvastatin (LIPITOR) 20 MG tablet TAKE 1 TABLET BY MOUTH EVERY DAY 30 tablet 11  . citalopram (CELEXA) 10 MG tablet TAKE 1 TABLET BY MOUTH ONCE DAILY 90 tablet 3  . clonazePAM (KLONOPIN) 0.5 MG tablet Take 1 tablet (0.5 mg total) by mouth 3 (three) times daily as needed for anxiety. 90 tablet 2  . clopidogrel (PLAVIX) 75 MG tablet take 1 tablet by mouth once daily 30 tablet 11  . Cyanocobalamin (VITAMIN B-12) 1000 MCG SUBL Place 1 tablet (1,000 mcg total) under the tongue daily. 100 tablet 3  . fluticasone (FLONASE) 50 MCG/ACT nasal spray Place 2 sprays into both nostrils daily. 16 g 6  . loratadine (CLARITIN) 10 MG tablet Take 1 tablet (10 mg total) by mouth daily. 100 tablet 3  . losartan-hydrochlorothiazide (HYZAAR) 100-12.5 MG per tablet Take 1 tablet by mouth daily. 90 tablet 3  . pantoprazole (PROTONIX) 40 MG tablet TAKE 1 TABLET BY MOUTH EVERY DAY 90 tablet 0  . Tamsulosin HCl (FLOMAX) 0.4 MG CAPS Take 1 capsule by mouth at bedtime.     No facility-administered medications prior to visit.    ROS Review of Systems  Constitutional: Positive for fatigue. Negative for appetite change and unexpected weight change.  HENT: Negative for congestion, nosebleeds, sneezing, sore throat and trouble swallowing.   Eyes: Negative for itching and visual disturbance.  Respiratory: Negative for cough.   Cardiovascular: Negative for chest pain, palpitations and leg swelling.  Gastrointestinal: Negative for nausea, diarrhea, blood in stool and abdominal distention.  Genitourinary:  Positive for frequency. Negative for hematuria.  Musculoskeletal: Negative for back pain, joint swelling, gait problem and neck pain.  Skin: Negative for rash.  Neurological: Negative for dizziness, tremors, speech difficulty and weakness.  Psychiatric/Behavioral: Negative for suicidal ideas, sleep disturbance, dysphoric mood and agitation. The patient is not nervous/anxious.     Objective:  BP 140/64 mmHg  Pulse 90  Wt 206 lb (93.441 kg)  SpO2 95%  BP Readings from Last 3 Encounters:  03/15/15 140/64  02/07/15 140/80  12/27/14 122/68    Wt Readings from Last 3 Encounters:  03/15/15 206 lb (93.441 kg)  02/07/15 206 lb (93.441 kg)  12/27/14 202 lb 12.8 oz (91.989 kg)    Physical Exam  Constitutional: He is oriented to person, place, and time. He appears well-developed. No distress.  NAD  HENT:  Mouth/Throat: Oropharynx is clear and moist.  Eyes: Conjunctivae are normal. Pupils are equal, round, and reactive to light.  Neck: Normal range of motion. No JVD present. No thyromegaly present.  Cardiovascular: Normal rate, regular rhythm, normal heart sounds and intact distal pulses.  Exam reveals no gallop and no friction rub.   No murmur heard. Pulmonary/Chest: Effort normal and breath sounds normal. No respiratory distress. He has no wheezes. He has no rales. He exhibits no tenderness.  Abdominal: Soft. Bowel sounds are normal. He exhibits no distension and no mass. There is no tenderness. There is no rebound and no guarding.  Musculoskeletal: Normal range of motion. He  exhibits no edema or tenderness.  Lymphadenopathy:    He has no cervical adenopathy.  Neurological: He is alert and oriented to person, place, and time. He has normal reflexes. No cranial nerve deficit. He exhibits normal muscle tone. He displays a negative Romberg sign. Coordination and gait normal.  Skin: Skin is warm and dry. No rash noted.  Psychiatric: He has a normal mood and affect. His behavior is normal.  Judgment and thought content normal.    Lab Results  Component Value Date   WBC 4.6 10/16/2013   HGB 12.9* 10/16/2013   HCT 38.5* 10/16/2013   PLT 189.0 10/16/2013   GLUCOSE 115* 11/07/2014   CHOL 128 11/07/2014   TRIG 124.0 11/07/2014   HDL 32.60* 11/07/2014   LDLDIRECT 89.5 06/27/2012   LDLCALC 70 11/07/2014   ALT 33 11/07/2014   AST 17 11/07/2014   NA 140 11/07/2014   K 4.1 11/07/2014   CL 104 11/07/2014   CREATININE 1.09 11/07/2014   BUN 24* 11/07/2014   CO2 31 11/07/2014   TSH 1.08 10/24/2012   PSA 0.66 09/06/2007   INR 0.95 08/05/2011   HGBA1C 6.3 10/24/2012    No results found.  Assessment & Plan:   There are no diagnoses linked to this encounter. I am having Andrew Malone maintain his tamsulosin, Vitamin B-12, fluticasone, loratadine, clopidogrel, acyclovir, clonazePAM, losartan-hydrochlorothiazide, atorvastatin, citalopram, pantoprazole, and ammonium lactate.  Meds ordered this encounter  Medications  . ammonium lactate (LAC-HYDRIN) 12 % lotion    Sig: APPLY  TO THE AFFECTED AREA BID    Refill:  6     Follow-up: No Follow-up on file.  Walker Kehr, MD

## 2015-03-19 ENCOUNTER — Ambulatory Visit: Payer: Medicare Other | Admitting: Cardiology

## 2015-03-19 DIAGNOSIS — Z961 Presence of intraocular lens: Secondary | ICD-10-CM | POA: Diagnosis not present

## 2015-03-29 ENCOUNTER — Ambulatory Visit: Payer: Medicare Other | Admitting: Cardiology

## 2015-04-01 DIAGNOSIS — Z7984 Long term (current) use of oral hypoglycemic drugs: Secondary | ICD-10-CM | POA: Diagnosis not present

## 2015-04-01 DIAGNOSIS — E1142 Type 2 diabetes mellitus with diabetic polyneuropathy: Secondary | ICD-10-CM | POA: Diagnosis not present

## 2015-04-11 DIAGNOSIS — Z7984 Long term (current) use of oral hypoglycemic drugs: Secondary | ICD-10-CM | POA: Diagnosis not present

## 2015-04-11 DIAGNOSIS — E1142 Type 2 diabetes mellitus with diabetic polyneuropathy: Secondary | ICD-10-CM | POA: Diagnosis not present

## 2015-04-22 ENCOUNTER — Other Ambulatory Visit: Payer: Self-pay | Admitting: Internal Medicine
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
## 2015-04-30 ENCOUNTER — Encounter: Payer: Self-pay | Admitting: Cardiology

## 2015-04-30 ENCOUNTER — Ambulatory Visit (INDEPENDENT_AMBULATORY_CARE_PROVIDER_SITE_OTHER): Payer: Medicare Other | Admitting: Cardiology

## 2015-04-30 VITALS — BP 118/66 | HR 79 | Ht 70.0 in | Wt 206.0 lb

## 2015-04-30 DIAGNOSIS — I679 Cerebrovascular disease, unspecified: Secondary | ICD-10-CM

## 2015-04-30 DIAGNOSIS — E785 Hyperlipidemia, unspecified: Secondary | ICD-10-CM | POA: Diagnosis not present

## 2015-04-30 DIAGNOSIS — I350 Nonrheumatic aortic (valve) stenosis: Secondary | ICD-10-CM

## 2015-04-30 DIAGNOSIS — I2510 Atherosclerotic heart disease of native coronary artery without angina pectoris: Secondary | ICD-10-CM | POA: Diagnosis not present

## 2015-04-30 DIAGNOSIS — I10 Essential (primary) hypertension: Secondary | ICD-10-CM

## 2015-04-30 NOTE — Assessment & Plan Note (Signed)
Continue aspirin and statin. 

## 2015-04-30 NOTE — Assessment & Plan Note (Signed)
Patient will need follow-up echoes in the future. 

## 2015-04-30 NOTE — Assessment & Plan Note (Signed)
Blood pressure controlled. Continue present medications. 

## 2015-04-30 NOTE — Patient Instructions (Signed)
Your physician wants you to follow-up in: 6 MONTHS WITH DR CRENSHAW You will receive a reminder letter in the mail two months in advance. If you don't receive a letter, please call our office to schedule the follow-up appointment.   If you need a refill on your cardiac medications before your next appointment, please call your pharmacy.  

## 2015-04-30 NOTE — Assessment & Plan Note (Signed)
Continue statin. 

## 2015-05-13 ENCOUNTER — Other Ambulatory Visit: Payer: Self-pay | Admitting: Internal Medicine

## 2015-05-16 ENCOUNTER — Telehealth: Payer: Self-pay

## 2015-05-16 NOTE — Telephone Encounter (Signed)
OK to fill this prescription with additional refills x3 Thank you!  

## 2015-05-16 NOTE — Telephone Encounter (Signed)
Recd faxed rx refill request from [facility] (fax [phone], tele#[phone]) for clonazepam 0.5mg  tab----please advise, thanks

## 2015-05-17 NOTE — Telephone Encounter (Signed)
Called in to [facility] pharm in [place] (left message on pharm phone line, pharm currently closed)

## 2015-05-21 NOTE — Telephone Encounter (Signed)
Done

## 2015-06-03 ENCOUNTER — Other Ambulatory Visit: Payer: Self-pay | Admitting: Internal Medicine

## 2015-06-13 ENCOUNTER — Ambulatory Visit (INDEPENDENT_AMBULATORY_CARE_PROVIDER_SITE_OTHER): Payer: Medicare Other | Admitting: Internal Medicine

## 2015-06-13 ENCOUNTER — Other Ambulatory Visit (INDEPENDENT_AMBULATORY_CARE_PROVIDER_SITE_OTHER): Payer: Medicare Other

## 2015-06-13 ENCOUNTER — Encounter: Payer: Self-pay | Admitting: Internal Medicine

## 2015-06-13 VITALS — BP 130/80 | HR 68 | Wt 201.0 lb

## 2015-06-13 DIAGNOSIS — I10 Essential (primary) hypertension: Secondary | ICD-10-CM

## 2015-06-13 DIAGNOSIS — I2510 Atherosclerotic heart disease of native coronary artery without angina pectoris: Secondary | ICD-10-CM

## 2015-06-13 DIAGNOSIS — F32A Depression, unspecified: Secondary | ICD-10-CM

## 2015-06-13 DIAGNOSIS — M5440 Lumbago with sciatica, unspecified side: Secondary | ICD-10-CM

## 2015-06-13 DIAGNOSIS — E1159 Type 2 diabetes mellitus with other circulatory complications: Secondary | ICD-10-CM | POA: Diagnosis not present

## 2015-06-13 DIAGNOSIS — F329 Major depressive disorder, single episode, unspecified: Secondary | ICD-10-CM

## 2015-06-13 DIAGNOSIS — E538 Deficiency of other specified B group vitamins: Secondary | ICD-10-CM

## 2015-06-13 DIAGNOSIS — I679 Cerebrovascular disease, unspecified: Secondary | ICD-10-CM

## 2015-06-13 DIAGNOSIS — G4733 Obstructive sleep apnea (adult) (pediatric): Secondary | ICD-10-CM

## 2015-06-13 LAB — BASIC METABOLIC PANEL
BUN: 22 mg/dL (ref 6–23)
CALCIUM: 9.9 mg/dL (ref 8.4–10.5)
CO2: 28 mEq/L (ref 19–32)
Chloride: 104 mEq/L (ref 96–112)
Creatinine, Ser: 1.18 mg/dL (ref 0.40–1.50)
GFR: 64.36 mL/min (ref >60.00)
GLUCOSE: 134 mg/dL — AB (ref 70–99)
POTASSIUM: 4.3 meq/L (ref 3.5–5.1)
SODIUM: 139 meq/L (ref 135–145)

## 2015-06-13 LAB — HEPATIC FUNCTION PANEL
ALBUMIN: 4.2 g/dL (ref 3.5–5.2)
ALT: 20 U/L (ref 0–53)
AST: 15 U/L (ref 0–37)
Alkaline Phosphatase: 79 U/L (ref 39–117)
BILIRUBIN DIRECT: 0.1 mg/dL (ref 0.0–0.3)
TOTAL PROTEIN: 7.1 g/dL (ref 6.0–8.3)
Total Bilirubin: 0.6 mg/dL (ref 0.2–1.2)

## 2015-06-13 LAB — CBC WITH DIFFERENTIAL/PLATELET
BASOS ABS: 0 10*3/uL (ref 0.0–0.1)
Basophils Relative: 0.3 % (ref 0.0–3.0)
Eosinophils Absolute: 0.3 10*3/uL (ref 0.0–0.7)
Eosinophils Relative: 4.2 % (ref 0.0–5.0)
HCT: 37.4 % — ABNORMAL LOW (ref 39.0–52.0)
HEMOGLOBIN: 12.7 g/dL — AB (ref 13.0–17.0)
LYMPHS ABS: 1.8 10*3/uL (ref 0.7–4.0)
Lymphocytes Relative: 29.2 % (ref 12.0–46.0)
MCHC: 33.9 g/dL (ref 30.0–36.0)
MCV: 86.7 fl (ref 78.0–100.0)
MONO ABS: 0.6 10*3/uL (ref 0.1–1.0)
Monocytes Relative: 9.3 % (ref 3.0–12.0)
NEUTROS PCT: 57 % (ref 43.0–77.0)
Neutro Abs: 3.6 10*3/uL (ref 1.4–7.7)
Platelets: 206 10*3/uL (ref 150.0–400.0)
RBC: 4.32 Mil/uL (ref 4.22–5.81)
RDW: 13.6 % (ref 11.5–15.5)
WBC: 6.3 10*3/uL (ref 4.0–10.5)

## 2015-06-13 LAB — TSH: TSH: 2.55 u[IU]/mL (ref 0.35–4.50)

## 2015-06-13 NOTE — Assessment & Plan Note (Signed)
On CPAP. ?

## 2015-06-13 NOTE — Assessment & Plan Note (Signed)
Norco prn  Potential benefits of a long term opioids use as well as potential risks (i.e. addiction risk, apnea etc) and complications (i.e. Somnolence, constipation and others) were explained to the patient and were aknowledged. 

## 2015-06-13 NOTE — Progress Notes (Signed)
Pre visit review using our clinic review tool, if applicable. No additional management support is needed unless otherwise documented below in the visit note. 

## 2015-06-13 NOTE — Assessment & Plan Note (Signed)
On Losartan HCT 

## 2015-06-13 NOTE — Progress Notes (Signed)
Subjective:  [name]: [name], male    DOB: [birth year]  Age: 75 y.o. MRN: [id number]  CC: No chief complaint on file.   HPI BRAIAN MERRIGAN Sr. presents for allergies, HTN, CAD, BPH f/u  Outpatient Prescriptions Prior to Visit  Medication Sig Dispense Refill  . acyclovir (ZOVIRAX) 200 MG capsule TAKE ONE CAPSULE BY MOUTH TWICE DAILY 60 capsule 5  . ammonium lactate (LAC-HYDRIN) 12 % lotion APPLY  TO THE AFFECTED AREA BID  6  . atorvastatin (LIPITOR) 20 MG tablet TAKE 1 TABLET BY MOUTH EVERY DAY 30 tablet 11  . citalopram (CELEXA) 10 MG tablet TAKE 1 TABLET BY MOUTH ONCE DAILY 90 tablet 3  . clonazePAM (KLONOPIN) 0.5 MG tablet TAKE 1 TABLET BY MOUTH THREE TIMES DAILY AS NEEDED FOR ANXIETY 90 tablet 3  . clopidogrel (PLAVIX) 75 MG tablet take 1 tablet by mouth once daily 30 tablet 11  . Cyanocobalamin (VITAMIN B-12) 1000 MCG SUBL Place 1 tablet (1,000 mcg total) under the tongue daily. 100 tablet 3  . fluticasone (FLONASE) 50 MCG/ACT nasal spray USE 2 SPRAYS IN EACH NOSTRIL DAILY 16 g 3  . loratadine (CLARITIN) 10 MG tablet Take 1 tablet (10 mg total) by mouth daily. 100 tablet 3  . losartan-hydrochlorothiazide (HYZAAR) 100-12.5 MG per tablet Take 1 tablet by mouth daily. 90 tablet 3  . metFORMIN (GLUCOPHAGE-XR) 500 MG 24 hr tablet Take 500 mg by mouth 2 (two) times daily.  3  . pantoprazole (PROTONIX) 40 MG tablet TAKE 1 TABLET BY MOUTH EVERY DAY 90 tablet 0  . Tamsulosin HCl (FLOMAX) 0.4 MG CAPS Take 1 capsule by mouth at bedtime.     No facility-administered medications prior to visit.    ROS Review of Systems  Constitutional: Negative for appetite change, fatigue and unexpected weight change.  HENT: Negative for congestion, nosebleeds, sneezing, sore throat and trouble swallowing.   Eyes: Negative for itching and visual disturbance.  Respiratory: Negative for cough.   Cardiovascular: Negative for chest pain, palpitations and leg swelling.  Gastrointestinal:  Negative for nausea, diarrhea, blood in stool and abdominal distention.  Genitourinary: Negative for frequency and hematuria.  Musculoskeletal: Positive for back pain and arthralgias. Negative for joint swelling, gait problem and neck pain.  Skin: Negative for rash.  Neurological: Negative for dizziness, tremors, speech difficulty and weakness.  Psychiatric/Behavioral: Negative for sleep disturbance, dysphoric mood and agitation. The patient is not nervous/anxious.     Objective:  BP 130/80 mmHg  Pulse 68  Wt 201 lb (91.173 kg)  SpO2 96%  BP Readings from Last 3 Encounters:  06/13/15 130/80  04/30/15 118/66  03/15/15 140/64    Wt Readings from Last 3 Encounters:  06/13/15 201 lb (91.173 kg)  04/30/15 206 lb (93.441 kg)  03/15/15 206 lb (93.441 kg)    Physical Exam  Constitutional: He is oriented to person, place, and time. He appears well-developed. No distress.  NAD  HENT:  Mouth/Throat: Oropharynx is clear and moist.  Eyes: Conjunctivae are normal. Pupils are equal, round, and reactive to light.  Neck: Normal range of motion. No JVD present. No thyromegaly present.  Cardiovascular: Normal rate, regular rhythm, normal heart sounds and intact distal pulses.  Exam reveals no gallop and no friction rub.   No murmur heard. Pulmonary/Chest: Effort normal and breath sounds normal. No respiratory distress. He has no wheezes. He has no rales. He exhibits no tenderness.  Abdominal: Soft. Bowel sounds are normal. He exhibits no distension and  no mass. There is no tenderness. There is no rebound and no guarding.  Musculoskeletal: Normal range of motion. He exhibits no edema or tenderness.  Lymphadenopathy:    He has no cervical adenopathy.  Neurological: He is alert and oriented to person, place, and time. He has normal reflexes. No cranial nerve deficit. He exhibits normal muscle tone. He displays a negative Romberg sign. Coordination and gait normal.  Skin: Skin is warm and dry. No  rash noted.  Psychiatric: He has a normal mood and affect. His behavior is normal. Judgment and thought content normal.    Lab Results  Component Value Date   WBC 4.6 10/16/2013   HGB 12.9* 10/16/2013   HCT 38.5* 10/16/2013   PLT 189.0 10/16/2013   GLUCOSE 115* 11/07/2014   CHOL 128 11/07/2014   TRIG 124.0 11/07/2014   HDL 32.60* 11/07/2014   LDLDIRECT 89.5 06/27/2012   LDLCALC 70 11/07/2014   ALT 33 11/07/2014   AST 17 11/07/2014   NA 140 11/07/2014   K 4.1 11/07/2014   CL 104 11/07/2014   CREATININE 1.09 11/07/2014   BUN 24* 11/07/2014   CO2 31 11/07/2014   TSH 1.08 10/24/2012   PSA 0.66 09/06/2007   INR 0.95 08/05/2011   HGBA1C 6.3 10/24/2012    No results found.  Assessment & Plan:   There are no diagnoses linked to this encounter. I am having Mr. Craine maintain his tamsulosin, Vitamin B-12, loratadine, clopidogrel, losartan-hydrochlorothiazide, atorvastatin, citalopram, ammonium lactate, acyclovir, fluticasone, metFORMIN, clonazePAM, pantoprazole, and Cholecalciferol.  Meds ordered this encounter  Medications  . Cholecalciferol 1000 units tablet    Sig: Take 1,000 Units by mouth daily.     Follow-up: No Follow-up on file.  Walker Kehr, MD

## 2015-06-13 NOTE — Assessment & Plan Note (Signed)
On B12 

## 2015-06-13 NOTE — Assessment & Plan Note (Signed)
Plavix, Lipitor, Losartan HCT

## 2015-06-26 ENCOUNTER — Ambulatory Visit (INDEPENDENT_AMBULATORY_CARE_PROVIDER_SITE_OTHER): Payer: Medicare Other | Admitting: Neurology

## 2015-06-26 ENCOUNTER — Encounter: Payer: Self-pay | Admitting: Neurology

## 2015-06-26 VITALS — BP 128/70 | HR 79 | Ht 70.0 in | Wt 205.2 lb

## 2015-06-26 DIAGNOSIS — I6990 Unspecified sequelae of unspecified cerebrovascular disease: Secondary | ICD-10-CM

## 2015-06-26 DIAGNOSIS — I2510 Atherosclerotic heart disease of native coronary artery without angina pectoris: Secondary | ICD-10-CM | POA: Diagnosis not present

## 2015-06-26 NOTE — Patient Instructions (Signed)
I had a long d/w patient about his remote stroke,carotid stent, risk for recurrent stroke/TIAs, personally independently reviewed imaging studies and stroke evaluation results and answered questions.Continue Plavix  for secondary stroke prevention and maintain strict control of hypertension with blood pressure goal below 130/90, diabetes with hemoglobin A1c goal below 6.5% and lipids with LDL cholesterol goal below 70 mg/dL. I also advised the patient to eat a healthy diet with plenty of whole grains, cereals, fruits and vegetables, exercise regularly and maintain ideal body weight. Since patient has done well and is free from neurovascular symptoms for a couple of years I do not believe routine follow-up appointment with me is necessary. He may be referred back in the future as needed.
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
## 2015-07-10 ENCOUNTER — Other Ambulatory Visit: Payer: Self-pay | Admitting: Internal Medicine

## 2015-08-29 ENCOUNTER — Other Ambulatory Visit: Payer: Self-pay | Admitting: Internal Medicine

## 2015-09-02 ENCOUNTER — Other Ambulatory Visit (HOSPITAL_COMMUNITY): Payer: Self-pay | Admitting: Interventional Radiology

## 2015-09-02 DIAGNOSIS — I771 Stricture of artery: Secondary | ICD-10-CM

## 2015-09-05 ENCOUNTER — Ambulatory Visit (HOSPITAL_COMMUNITY)
Admission: RE | Admit: 2015-09-05 | Discharge: 2015-09-05 | Disposition: A | Discharge status: Home / Self Care | Payer: Medicare Other | Source: Ambulatory Visit | Attending: Interventional Radiology | Admitting: Interventional Radiology

## 2015-09-05 DIAGNOSIS — I771 Stricture of artery: Secondary | ICD-10-CM | POA: Diagnosis not present

## 2015-09-05 DIAGNOSIS — G4733 Obstructive sleep apnea (adult) (pediatric): Secondary | ICD-10-CM | POA: Diagnosis not present

## 2015-09-05 DIAGNOSIS — E785 Hyperlipidemia, unspecified: Secondary | ICD-10-CM | POA: Insufficient documentation

## 2015-09-05 DIAGNOSIS — F419 Anxiety disorder, unspecified: Secondary | ICD-10-CM | POA: Insufficient documentation

## 2015-09-05 DIAGNOSIS — K219 Gastro-esophageal reflux disease without esophagitis: Secondary | ICD-10-CM | POA: Diagnosis not present

## 2015-09-05 DIAGNOSIS — I2510 Atherosclerotic heart disease of native coronary artery without angina pectoris: Secondary | ICD-10-CM | POA: Insufficient documentation

## 2015-09-05 DIAGNOSIS — I10 Essential (primary) hypertension: Secondary | ICD-10-CM | POA: Insufficient documentation

## 2015-09-05 LAB — VAS US CAROTID
LCCAPDIAS: 29 cm/s
LEFT ECA DIAS: -23 cm/s
LEFT VERTEBRAL DIAS: 21 cm/s
LICADDIAS: -35 cm/s
LICAPDIAS: -34 cm/s
LICAPSYS: -96 cm/s
Left CCA dist dias: -30 cm/s
Left CCA dist sys: -102 cm/s
Left CCA prox sys: 120 cm/s
Left ICA dist sys: -120 cm/s
RCCAPDIAS: 24 cm/s
RIGHT ECA DIAS: -24 cm/s
RIGHT VERTEBRAL DIAS: 10 cm/s
Right CCA prox sys: 120 cm/s
Right cca dist sys: -95 cm/s

## 2015-09-05 NOTE — Progress Notes (Signed)
Preliminary report by tech - Carotid Duplex Completed. The right ICA stent is patent with no evidence of stenosis. The left ICA demonstrated a mild amount of plaque suggestive of 1-39% stenosis.  Oda Cogan, BS, RDMS, RVT

## 2015-09-12 ENCOUNTER — Other Ambulatory Visit: Payer: Self-pay | Admitting: Internal Medicine

## 2015-09-17 ENCOUNTER — Encounter: Payer: Self-pay | Admitting: Internal Medicine

## 2015-09-17 ENCOUNTER — Ambulatory Visit (INDEPENDENT_AMBULATORY_CARE_PROVIDER_SITE_OTHER): Payer: Medicare Other | Admitting: Internal Medicine

## 2015-09-17 VITALS — BP 130/80 | HR 88 | Temp 97.6°F | Resp 12 | Ht 70.0 in | Wt 203.0 lb

## 2015-09-17 DIAGNOSIS — I10 Essential (primary) hypertension: Secondary | ICD-10-CM | POA: Diagnosis not present

## 2015-09-17 DIAGNOSIS — E538 Deficiency of other specified B group vitamins: Secondary | ICD-10-CM | POA: Diagnosis not present

## 2015-09-17 DIAGNOSIS — M5440 Lumbago with sciatica, unspecified side: Secondary | ICD-10-CM

## 2015-09-17 DIAGNOSIS — E1159 Type 2 diabetes mellitus with other circulatory complications: Secondary | ICD-10-CM | POA: Diagnosis not present

## 2015-09-17 DIAGNOSIS — F419 Anxiety disorder, unspecified: Secondary | ICD-10-CM

## 2015-09-17 DIAGNOSIS — I2510 Atherosclerotic heart disease of native coronary artery without angina pectoris: Secondary | ICD-10-CM

## 2015-09-17 DIAGNOSIS — E785 Hyperlipidemia, unspecified: Secondary | ICD-10-CM

## 2015-09-17 DIAGNOSIS — F32A Depression, unspecified: Secondary | ICD-10-CM

## 2015-09-17 DIAGNOSIS — F329 Major depressive disorder, single episode, unspecified: Secondary | ICD-10-CM

## 2015-09-17 DIAGNOSIS — I679 Cerebrovascular disease, unspecified: Secondary | ICD-10-CM

## 2015-09-17 MED ORDER — ATORVASTATIN CALCIUM 20 MG PO TABS: 20.0000 mg | ORAL_TABLET | Freq: Every day | ORAL | Status: DC

## 2015-09-17 NOTE — Assessment & Plan Note (Addendum)
On B12 

## 2015-09-17 NOTE — Assessment & Plan Note (Signed)
On Lipitor 

## 2015-09-17 NOTE — Progress Notes (Signed)
Pre visit review using our clinic review tool, if applicable. No additional management support is needed unless otherwise documented below in the visit note. 

## 2015-09-17 NOTE — Assessment & Plan Note (Signed)
On Citalopram 

## 2015-09-17 NOTE — Progress Notes (Signed)
Subjective:  [name]: Andrew Malone., male    DOB: 1940-11-13  Age: 75 y.o. MRN: [id number]  CC: No chief complaint on file.   HPI Andrew MCGAVOCK Sr. presents for anxiety, depression, dyslipidemia, CAD f/u  The pt and his wife are upset with some prescriptions questions.  Outpatient Prescriptions Prior to Visit  Medication Sig Dispense Refill  . acyclovir (ZOVIRAX) 200 MG capsule TAKE ONE CAPSULE BY MOUTH TWICE DAILY 60 capsule 5  . ammonium lactate (LAC-HYDRIN) 12 % lotion APPLY  TO THE AFFECTED AREA BID  6  . atorvastatin (LIPITOR) 20 MG tablet TAKE 1 TABLET BY MOUTH EVERY DAY 30 tablet 11  . Cholecalciferol 1000 units tablet Take 1,000 Units by mouth daily.    . citalopram (CELEXA) 10 MG tablet TAKE 1 TABLET BY MOUTH ONCE DAILY 90 tablet 3  . clonazePAM (KLONOPIN) 0.5 MG tablet TAKE 1 TABLET BY MOUTH THREE TIMES DAILY AS NEEDED FOR ANXIETY 90 tablet 3  . clopidogrel (PLAVIX) 75 MG tablet TAKE 1 TABLET BY MOUTH EVERY DAY 30 tablet 5  . Cyanocobalamin (VITAMIN B-12) 1000 MCG SUBL Place 1 tablet (1,000 mcg total) under the tongue daily. 100 tablet 3  . fluticasone (FLONASE) 50 MCG/ACT nasal spray USE 2 SPRAYS IN EACH NOSTRIL DAILY 16 g 3  . losartan-hydrochlorothiazide (HYZAAR) 100-12.5 MG tablet TAKE 1 TABLET BY MOUTH ONCE DAILY 90 tablet 0  . metFORMIN (GLUCOPHAGE-XR) 500 MG 24 hr tablet Take 500 mg by mouth 2 (two) times daily.  3  . pantoprazole (PROTONIX) 40 MG tablet TAKE 1 TABLET BY MOUTH EVERY DAY 90 tablet 2  . Tamsulosin HCl (FLOMAX) 0.4 MG CAPS Take 1 capsule by mouth at bedtime.    Marland Kitchen loratadine (CLARITIN) 10 MG tablet Take 1 tablet (10 mg total) by mouth daily. (Patient not taking: Reported on 09/17/2015) 100 tablet 3   No facility-administered medications prior to visit.    ROS Review of Systems  Constitutional: Negative for appetite change, fatigue and unexpected weight change.  HENT: Negative for congestion, nosebleeds, sneezing, sore throat and trouble  swallowing.   Eyes: Negative for itching and visual disturbance.  Respiratory: Negative for cough.   Cardiovascular: Negative for chest pain, palpitations and leg swelling.  Gastrointestinal: Negative for nausea, diarrhea, blood in stool and abdominal distention.  Genitourinary: Negative for frequency and hematuria.  Musculoskeletal: Negative for back pain, joint swelling, gait problem and neck pain.  Skin: Negative for rash.  Neurological: Negative for dizziness, tremors, speech difficulty and weakness.  Psychiatric/Behavioral: Negative for sleep disturbance, dysphoric mood and agitation. The patient is not nervous/anxious.     Objective:  BP 170/78 mmHg  Pulse 88  Temp(Src) 97.6 F (36.4 C) (Oral)  Resp 12  Ht 5\' 10"  (1.778 m)  Wt 203 lb (92.08 kg)  BMI 29.13 kg/m2  SpO2 97%  BP Readings from Last 3 Encounters:  09/17/15 170/78  06/26/15 128/70  06/13/15 130/80    Wt Readings from Last 3 Encounters:  09/17/15 203 lb (92.08 kg)  06/26/15 205 lb 3.2 oz (93.078 kg)  06/13/15 201 lb (91.173 kg)    Physical Exam  Constitutional: He is oriented to person, place, and time. He appears well-developed. No distress.  NAD  HENT:  Mouth/Throat: Oropharynx is clear and moist.  Eyes: Conjunctivae are normal. Pupils are equal, round, and reactive to light.  Neck: Normal range of motion. No JVD present. No thyromegaly present.  Cardiovascular: Normal rate, regular rhythm, normal heart sounds and intact  distal pulses.  Exam reveals no gallop and no friction rub.   No murmur heard. Pulmonary/Chest: Effort normal and breath sounds normal. No respiratory distress. He has no wheezes. He has no rales. He exhibits no tenderness.  Abdominal: Soft. Bowel sounds are normal. He exhibits no distension and no mass. There is no tenderness. There is no rebound and no guarding.  Musculoskeletal: Normal range of motion. He exhibits no edema or tenderness.  Lymphadenopathy:    He has no cervical  adenopathy.  Neurological: He is alert and oriented to person, place, and time. He has normal reflexes. No cranial nerve deficit. He exhibits normal muscle tone. He displays a negative Romberg sign. Coordination and gait normal.  Skin: Skin is warm and dry. No rash noted.  Psychiatric: He has a normal mood and affect. His behavior is normal. Judgment and thought content normal.    Lab Results  Component Value Date   WBC 6.3 06/13/2015   HGB 12.7* 06/13/2015   HCT 37.4* 06/13/2015   PLT 206.0 06/13/2015   GLUCOSE 134* 06/13/2015   CHOL 128 11/07/2014   TRIG 124.0 11/07/2014   HDL 32.60* 11/07/2014   LDLDIRECT 89.5 06/27/2012   LDLCALC 70 11/07/2014   ALT 20 06/13/2015   AST 15 06/13/2015   NA 139 06/13/2015   K 4.3 06/13/2015   CL 104 06/13/2015   CREATININE 1.18 06/13/2015   BUN 22 06/13/2015   CO2 28 06/13/2015   TSH 2.55 06/13/2015   PSA 0.66 09/06/2007   INR 0.95 08/05/2011   HGBA1C 6.3 10/24/2012    No results found.  Assessment & Plan:   Diagnoses and all orders for this visit:  Essential hypertension  Atherosclerosis of native coronary artery of native heart without angina pectoris  B12 deficiency  Controlled type 2 diabetes mellitus with other circulatory complication, without long-term current use of insulin ([place])   I am having Andrew Malone maintain his tamsulosin, Vitamin B-12, loratadine, atorvastatin, citalopram, ammonium lactate, acyclovir, fluticasone, metFORMIN, clonazePAM, Cholecalciferol, losartan-hydrochlorothiazide, clopidogrel, and pantoprazole.  No orders of the defined types were placed in this encounter.     Follow-up: No Follow-up on file.  Walker Kehr, MD

## 2015-09-17 NOTE — Assessment & Plan Note (Signed)
Norco prn - not taking now

## 2015-09-17 NOTE — Assessment & Plan Note (Signed)
Metformin Labs 

## 2015-09-17 NOTE — Assessment & Plan Note (Signed)
Plavix, Lipitor, Losartan HCT

## 2015-09-17 NOTE — Assessment & Plan Note (Signed)
Plavix, Losartan HCT, Lipitor, ASA

## 2015-09-17 NOTE — Assessment & Plan Note (Signed)
On Losartan HCT 

## 2015-09-17 NOTE — Assessment & Plan Note (Signed)
Klonopin prn  Potential benefits of a long term benzodiazepines  use as well as potential risks  and complications were explained to the patient and were aknowledged. 

## 2015-09-18 ENCOUNTER — Encounter: Payer: Self-pay | Admitting: Internal Medicine

## 2015-10-07 ENCOUNTER — Other Ambulatory Visit: Payer: Self-pay | Admitting: Internal Medicine

## 2015-10-17 ENCOUNTER — Encounter: Payer: Self-pay | Admitting: Cardiology

## 2015-10-31 ENCOUNTER — Ambulatory Visit (INDEPENDENT_AMBULATORY_CARE_PROVIDER_SITE_OTHER): Payer: Medicare Other | Admitting: Cardiology

## 2015-10-31 ENCOUNTER — Encounter (INDEPENDENT_AMBULATORY_CARE_PROVIDER_SITE_OTHER): Payer: Self-pay

## 2015-10-31 ENCOUNTER — Encounter: Payer: Self-pay | Admitting: Cardiology

## 2015-10-31 VITALS — BP 138/66 | HR 59 | Ht 70.0 in | Wt 202.0 lb

## 2015-10-31 DIAGNOSIS — I350 Nonrheumatic aortic (valve) stenosis: Secondary | ICD-10-CM | POA: Diagnosis not present

## 2015-10-31 DIAGNOSIS — I10 Essential (primary) hypertension: Secondary | ICD-10-CM

## 2015-10-31 DIAGNOSIS — I2510 Atherosclerotic heart disease of native coronary artery without angina pectoris: Secondary | ICD-10-CM | POA: Diagnosis not present

## 2015-10-31 DIAGNOSIS — E785 Hyperlipidemia, unspecified: Secondary | ICD-10-CM | POA: Diagnosis not present

## 2015-10-31 NOTE — Patient Instructions (Signed)
Medication Instructions:   NO CHANGES.   Follow-Up: Your physician wants you to follow-up in: [place].  You will receive a reminder letter in the mail two months in advance. If you don't receive a letter, please call our office to schedule the follow-up appointment.   If you need a refill on your cardiac medications before your next appointment, please call your pharmacy.
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
## 2015-11-25 ENCOUNTER — Ambulatory Visit (INDEPENDENT_AMBULATORY_CARE_PROVIDER_SITE_OTHER): Payer: Medicare Other | Admitting: Pulmonary Disease

## 2015-11-25 ENCOUNTER — Encounter: Payer: Self-pay | Admitting: Pulmonary Disease

## 2015-11-25 VITALS — BP 124/72 | HR 65 | Ht 70.5 in | Wt 202.6 lb

## 2015-11-25 DIAGNOSIS — F518 Other sleep disorders not due to a substance or known physiological condition: Secondary | ICD-10-CM

## 2015-11-25 DIAGNOSIS — G4733 Obstructive sleep apnea (adult) (pediatric): Secondary | ICD-10-CM

## 2015-11-25 DIAGNOSIS — Z23 Encounter for immunization: Secondary | ICD-10-CM | POA: Diagnosis not present

## 2015-11-25 DIAGNOSIS — Z9989 Dependence on other enabling machines and devices: Principal | ICD-10-CM

## 2015-11-25 DIAGNOSIS — I2510 Atherosclerotic heart disease of native coronary artery without angina pectoris: Secondary | ICD-10-CM

## 2015-11-25 NOTE — Progress Notes (Signed)
Current Outpatient Prescriptions on File Prior to Visit  Medication Sig  . acyclovir (ZOVIRAX) 200 MG capsule TAKE ONE CAPSULE BY MOUTH TWICE DAILY  . ammonium lactate (LAC-HYDRIN) 12 % lotion APPLY  TO THE AFFECTED AREA BID  . atorvastatin (LIPITOR) 20 MG tablet Take 1 tablet (20 mg total) by mouth daily.  . Cholecalciferol 1000 units tablet Take 1,000 Units by mouth daily.  . citalopram (CELEXA) 10 MG tablet TAKE 1 TABLET BY MOUTH ONCE DAILY  . clonazePAM (KLONOPIN) 0.5 MG tablet TAKE 1 TABLET BY MOUTH THREE TIMES DAILY AS NEEDED FOR ANXIETY  . clopidogrel (PLAVIX) 75 MG tablet TAKE 1 TABLET BY MOUTH EVERY DAY  . Cyanocobalamin (VITAMIN B-12) 1000 MCG SUBL Place 1 tablet (1,000 mcg total) under the tongue daily.  . fluticasone (FLONASE) 50 MCG/ACT nasal spray USE 2 SPRAYS IN EACH NOSTRIL DAILY  . loratadine (CLARITIN) 10 MG tablet Take 1 tablet (10 mg total) by mouth daily.  Marland Kitchen losartan-hydrochlorothiazide (HYZAAR) 100-12.5 MG tablet TAKE 1 TABLET BY MOUTH ONCE DAILY  . metFORMIN (GLUCOPHAGE-XR) 500 MG 24 hr tablet Take 500 mg by mouth 2 (two) times daily.  . pantoprazole (PROTONIX) 40 MG tablet TAKE 1 TABLET BY MOUTH EVERY DAY  . Tamsulosin HCl (FLOMAX) 0.4 MG CAPS Take 1 capsule by mouth at bedtime.   No current facility-administered medications on file prior to visit.     Chief Complaint  Patient presents with  . Follow-up    Wears CPAP nightly. Denies any problems with mask/ pressure. DME: [facility] ; Would like flu vaccine today.    Sleep tests PSG 09/14/10>>AHI 8, SpO2 low 84% Auto CPAP 02/18/12 to 05/17/12 >> Used on 60 of 90 nights with average 6 hrs 32 min.  Average AHI 0.6 with median CPAP 6 cm H2O and 95th percentile CPAP 8 cm H2O.  Cardiac tests Echo 11/07/14 >> EF 55 to 60%, mild LVH, grade 1 diastolic dysfx, mild AS  Past medical history  CAD, Carotid artery disease, CVA, HLD, HTN, GERD, GN, HH, PUD, chronic back pain, Anxiety, Aortic stenosis  Past surgical history, Family  history, Social history, Allergies reviewed.  Vital signs BP 124/72 (BP Location: Left Arm, Cuff Size: Normal)   Pulse 65   Ht 5' 10.5" (1.791 m)   Wt 202 lb 9.6 oz (91.9 kg)   SpO2 95%   BMI 28.66 kg/m   History of Present Illness: Andrew Malone. is a 75 y.o. male with OSA.  He is doing well with CPAP.  No issues with mask fit.  Occasionally gets mouth dryness.  Does okay with 5 to 6 hrs sleep.  Needs new machine >> his is more than 75 yrs old.  Physical Exam:  General - No distress ENT - No sinus tenderness, no oral exudate, no LAN, MP 3 Cardiac - s1s2 regular, 2/6 SM Chest - No wheeze/rales/dullness Back - No focal tenderness Abd - Soft, non-tender Ext - No edema Neuro - Normal strength Skin - No rashes Psych - normal mood, and behavior   Assessment/Plan:  Obstructive sleep apnea. - He is compliant with CPAP and reports benefit. - continue CPAP 8 cm H2O - will arrange for new machine >> his is more than 75 yrs old  Short sleeper. - Reviewed proper expectations from sleep.   Patient Instructions  Flu shot today  Will arrange for new CPAP machine with pressure setting of 8 cm H2O and heated humidity  Follow up in 1 year   [employer],  MD [facility] Pager:  [phone] 11/25/2015, 9:24 AM

## 2015-11-25 NOTE — Patient Instructions (Signed)
Flu shot today  Will arrange for new CPAP machine with pressure setting of 8 cm H2O and heated humidity  Follow up in 1 year

## 2015-11-25 NOTE — Addendum Note (Signed)
Addended by: Virl Cagey on: 11/25/2015 09:32 AM   Modules accepted: Orders

## 2015-12-12 DIAGNOSIS — R351 Nocturia: Secondary | ICD-10-CM | POA: Diagnosis not present

## 2015-12-12 DIAGNOSIS — N5201 Erectile dysfunction due to arterial insufficiency: Secondary | ICD-10-CM | POA: Diagnosis not present

## 2015-12-12 DIAGNOSIS — N401 Enlarged prostate with lower urinary tract symptoms: Secondary | ICD-10-CM | POA: Diagnosis not present

## 2015-12-12 DIAGNOSIS — R972 Elevated prostate specific antigen [PSA]: Secondary | ICD-10-CM | POA: Diagnosis not present

## 2015-12-18 ENCOUNTER — Ambulatory Visit: Payer: Medicare Other | Admitting: Internal Medicine

## 2015-12-24 ENCOUNTER — Other Ambulatory Visit: Payer: Self-pay | Admitting: Internal Medicine

## 2015-12-27 ENCOUNTER — Ambulatory Visit (INDEPENDENT_AMBULATORY_CARE_PROVIDER_SITE_OTHER): Payer: Medicare Other | Admitting: Internal Medicine

## 2015-12-27 ENCOUNTER — Other Ambulatory Visit (INDEPENDENT_AMBULATORY_CARE_PROVIDER_SITE_OTHER): Payer: Medicare Other

## 2015-12-27 ENCOUNTER — Encounter: Payer: Self-pay | Admitting: Internal Medicine

## 2015-12-27 VITALS — BP 160/80 | HR 73 | Wt 201.0 lb

## 2015-12-27 DIAGNOSIS — I10 Essential (primary) hypertension: Secondary | ICD-10-CM | POA: Diagnosis not present

## 2015-12-27 DIAGNOSIS — E785 Hyperlipidemia, unspecified: Secondary | ICD-10-CM | POA: Diagnosis not present

## 2015-12-27 DIAGNOSIS — F329 Major depressive disorder, single episode, unspecified: Secondary | ICD-10-CM

## 2015-12-27 DIAGNOSIS — E1159 Type 2 diabetes mellitus with other circulatory complications: Secondary | ICD-10-CM

## 2015-12-27 DIAGNOSIS — N401 Enlarged prostate with lower urinary tract symptoms: Secondary | ICD-10-CM

## 2015-12-27 DIAGNOSIS — I6350 Cerebral infarction due to unspecified occlusion or stenosis of unspecified cerebral artery: Secondary | ICD-10-CM

## 2015-12-27 DIAGNOSIS — N400 Enlarged prostate without lower urinary tract symptoms: Secondary | ICD-10-CM | POA: Insufficient documentation

## 2015-12-27 DIAGNOSIS — I2510 Atherosclerotic heart disease of native coronary artery without angina pectoris: Secondary | ICD-10-CM

## 2015-12-27 LAB — BASIC METABOLIC PANEL
BUN: 27 mg/dL — AB (ref 6–23)
CHLORIDE: 104 meq/L (ref 96–112)
CO2: 29 mEq/L (ref 19–32)
CREATININE: 1.2 mg/dL (ref 0.40–1.50)
Calcium: 10.2 mg/dL (ref 8.4–10.5)
GFR: 63.03 mL/min (ref >60.00)
Glucose, Bld: 267 mg/dL — ABNORMAL HIGH (ref 70–99)
Potassium: 4.5 mEq/L (ref 3.5–5.1)
Sodium: 138 mEq/L (ref 135–145)

## 2015-12-27 LAB — LIPID PANEL
CHOL/HDL RATIO: 4
Cholesterol: 134 mg/dL (ref 0–200)
HDL: 34.6 mg/dL — ABNORMAL LOW (ref >39.00)
LDL Cholesterol: 68 mg/dL (ref 0–99)
NONHDL: 99.08
Triglycerides: 153 mg/dL — ABNORMAL HIGH (ref 0.0–149.0)
VLDL: 30.6 mg/dL (ref 0.0–40.0)

## 2015-12-27 LAB — HEMOGLOBIN A1C: HEMOGLOBIN A1C: 6.7 % — AB (ref 4.6–6.5)

## 2015-12-27 NOTE — Assessment & Plan Note (Signed)
Plavix, Losartan HCT, Lipitor 

## 2015-12-27 NOTE — Assessment & Plan Note (Signed)
Metformin Labs 

## 2015-12-27 NOTE — Progress Notes (Signed)
Subjective:  [name]: Andrew Mechanic., male    DOB: 06-24-1940  Age: 75 y.o. MRN: [name]  CC: No chief complaint on file.   HPI Andrew RAITT Sr. presents for HTN, DM2, CAD, HTN f/u  Outpatient Medications Prior to Visit  Medication Sig Dispense Refill  . acyclovir (ZOVIRAX) 200 MG capsule TAKE ONE CAPSULE BY MOUTH TWICE DAILY 60 capsule 5  . ammonium lactate (LAC-HYDRIN) 12 % lotion APPLY  TO THE AFFECTED AREA BID  6  . atorvastatin (LIPITOR) 20 MG tablet Take 1 tablet (20 mg total) by mouth daily. 30 tablet 11  . Cholecalciferol 1000 units tablet Take 1,000 Units by mouth daily.    . citalopram (CELEXA) 10 MG tablet TAKE 1 TABLET BY MOUTH ONCE DAILY 90 tablet 0  . clonazePAM (KLONOPIN) 0.5 MG tablet TAKE 1 TABLET BY MOUTH THREE TIMES DAILY AS NEEDED FOR ANXIETY 90 tablet 3  . clopidogrel (PLAVIX) 75 MG tablet TAKE 1 TABLET BY MOUTH EVERY DAY 30 tablet 5  . Cyanocobalamin (VITAMIN B-12) 1000 MCG SUBL Place 1 tablet (1,000 mcg total) under the tongue daily. 100 tablet 3  . fluticasone (FLONASE) 50 MCG/ACT nasal spray USE 2 SPRAYS IN EACH NOSTRIL DAILY 16 g 3  . loratadine (CLARITIN) 10 MG tablet Take 1 tablet (10 mg total) by mouth daily. 100 tablet 3  . losartan-hydrochlorothiazide (HYZAAR) 100-12.5 MG tablet TAKE 1 TABLET BY MOUTH ONCE DAILY 90 tablet 3  . metFORMIN (GLUCOPHAGE-XR) 500 MG 24 hr tablet Take 500 mg by mouth 2 (two) times daily.  3  . pantoprazole (PROTONIX) 40 MG tablet TAKE 1 TABLET BY MOUTH EVERY DAY 90 tablet 2  . Tamsulosin HCl (FLOMAX) 0.4 MG CAPS Take 1 capsule by mouth at bedtime.     No facility-administered medications prior to visit.     ROS Review of Systems  Constitutional: Negative for appetite change, fatigue and unexpected weight change.  HENT: Negative for congestion, nosebleeds, sneezing, sore throat and trouble swallowing.   Eyes: Negative for itching and visual disturbance.  Respiratory: Negative for cough.   Cardiovascular:  Negative for chest pain, palpitations and leg swelling.  Gastrointestinal: Negative for abdominal distention, blood in stool, diarrhea and nausea.  Genitourinary: Negative for frequency and hematuria.  Musculoskeletal: Positive for back pain. Negative for gait problem, joint swelling and neck pain.  Skin: Negative for rash.  Neurological: Negative for dizziness, tremors, speech difficulty and weakness.  Psychiatric/Behavioral: Negative for agitation, dysphoric mood and sleep disturbance. The patient is not nervous/anxious.     Objective:  BP (!) 160/80   Pulse 73   Wt 201 lb (91.2 kg)   SpO2 96%   BMI 28.43 kg/m   BP Readings from Last 3 Encounters:  12/27/15 (!) 160/80  11/25/15 124/72  10/31/15 138/66    Wt Readings from Last 3 Encounters:  12/27/15 201 lb (91.2 kg)  11/25/15 202 lb 9.6 oz (91.9 kg)  10/31/15 202 lb (91.6 kg)    Physical Exam  Constitutional: He is oriented to person, place, and time. He appears well-developed. No distress.  NAD  HENT:  Mouth/Throat: Oropharynx is clear and moist.  Eyes: Conjunctivae are normal. Pupils are equal, round, and reactive to light.  Neck: Normal range of motion. No JVD present. No thyromegaly present.  Cardiovascular: Normal rate, regular rhythm, normal heart sounds and intact distal pulses.  Exam reveals no gallop and no friction rub.   No murmur heard. Pulmonary/Chest: Effort normal and breath sounds normal.  No respiratory distress. He has no wheezes. He has no rales. He exhibits no tenderness.  Abdominal: Soft. Bowel sounds are normal. He exhibits no distension and no mass. There is no tenderness. There is no rebound and no guarding.  Musculoskeletal: Normal range of motion. He exhibits tenderness. He exhibits no edema.  Lymphadenopathy:    He has no cervical adenopathy.  Neurological: He is alert and oriented to person, place, and time. He has normal reflexes. No cranial nerve deficit. He exhibits normal muscle tone. He  displays a negative Romberg sign. Coordination and gait normal.  Skin: Skin is warm and dry. No rash noted.  Psychiatric: He has a normal mood and affect. His behavior is normal. Judgment and thought content normal.  LBP w/ROM  Lab Results  Component Value Date   WBC 6.3 06/13/2015   HGB 12.7 (L) 06/13/2015   HCT 37.4 (L) 06/13/2015   PLT 206.0 06/13/2015   GLUCOSE 134 (H) 06/13/2015   CHOL 128 11/07/2014   TRIG 124.0 11/07/2014   HDL 32.60 (L) 11/07/2014   LDLDIRECT 89.5 06/27/2012   LDLCALC 70 11/07/2014   ALT 20 06/13/2015   AST 15 06/13/2015   NA 139 06/13/2015   K 4.3 06/13/2015   CL 104 06/13/2015   CREATININE 1.18 06/13/2015   BUN 22 06/13/2015   CO2 28 06/13/2015   TSH 2.55 06/13/2015   PSA 0.66 09/06/2007   INR 0.95 08/05/2011   HGBA1C 6.3 10/24/2012    No results found.  Assessment & Plan:   There are no diagnoses linked to this encounter. I am having Andrew Malone maintain his tamsulosin, Vitamin B-12, loratadine, ammonium lactate, acyclovir, fluticasone, metFORMIN, clonazePAM, Cholecalciferol, clopidogrel, pantoprazole, atorvastatin, losartan-hydrochlorothiazide, and citalopram.  No orders of the defined types were placed in this encounter.    Follow-up: No Follow-up on file.  Walker Kehr, MD

## 2015-12-27 NOTE — Assessment & Plan Note (Signed)
Citalopram 

## 2015-12-27 NOTE — Progress Notes (Signed)
Pre visit review using our clinic review tool, if applicable. No additional management support is needed unless otherwise documented below in the visit note. 

## 2015-12-27 NOTE — Assessment & Plan Note (Signed)
Flomax Dr Karsten Ro q 12 mo w/PSA

## 2015-12-27 NOTE — Assessment & Plan Note (Addendum)
On Losartan HCT - take today

## 2015-12-30 LAB — TSH: TSH: 1.2 u[IU]/mL (ref 0.35–4.50)

## 2016-01-08 ENCOUNTER — Telehealth: Payer: Self-pay | Admitting: *Deleted

## 2016-01-08 MED ORDER — CLONAZEPAM 0.5 MG PO TABS: 0.5000 mg | ORAL_TABLET | Freq: Three times a day (TID) | ORAL | 3 refills | Status: DC | PRN

## 2016-01-08 NOTE — Telephone Encounter (Signed)
OK to fill this prescription with additional refills x3 Thank you!  

## 2016-01-08 NOTE — Telephone Encounter (Signed)
Called refill into [facility] had to leave on pharmacy vm.../lmb 

## 2016-01-08 NOTE — Telephone Encounter (Signed)
Rec'd fax pt requesting refill on his Lorazepam 0.5 mg. Last filled 10/02/15...Johny Chess

## 2016-01-14 DIAGNOSIS — D225 Melanocytic nevi of trunk: Secondary | ICD-10-CM | POA: Diagnosis not present

## 2016-01-14 DIAGNOSIS — L820 Inflamed seborrheic keratosis: Secondary | ICD-10-CM | POA: Diagnosis not present

## 2016-01-14 DIAGNOSIS — Z85828 Personal history of other malignant neoplasm of skin: Secondary | ICD-10-CM | POA: Diagnosis not present

## 2016-01-14 DIAGNOSIS — D485 Neoplasm of uncertain behavior of skin: Secondary | ICD-10-CM | POA: Diagnosis not present

## 2016-01-14 DIAGNOSIS — D1801 Hemangioma of skin and subcutaneous tissue: Secondary | ICD-10-CM | POA: Diagnosis not present

## 2016-01-14 DIAGNOSIS — L570 Actinic keratosis: Secondary | ICD-10-CM | POA: Diagnosis not present

## 2016-01-14 DIAGNOSIS — L814 Other melanin hyperpigmentation: Secondary | ICD-10-CM | POA: Diagnosis not present

## 2016-02-10 DIAGNOSIS — J322 Chronic ethmoidal sinusitis: Secondary | ICD-10-CM | POA: Diagnosis not present

## 2016-02-10 DIAGNOSIS — J370 Chronic laryngitis: Secondary | ICD-10-CM | POA: Diagnosis not present

## 2016-02-10 DIAGNOSIS — J029 Acute pharyngitis, unspecified: Secondary | ICD-10-CM | POA: Diagnosis not present

## 2016-02-10 DIAGNOSIS — J320 Chronic maxillary sinusitis: Secondary | ICD-10-CM | POA: Diagnosis not present

## 2016-02-28 ENCOUNTER — Other Ambulatory Visit: Payer: Self-pay | Admitting: Internal Medicine

## 2016-03-16 ENCOUNTER — Other Ambulatory Visit: Payer: Self-pay | Admitting: Internal Medicine

## 2016-03-17 ENCOUNTER — Telehealth (HOSPITAL_COMMUNITY): Payer: Self-pay

## 2016-03-17 NOTE — Telephone Encounter (Signed)
Called to schedule f/u us carotid, left message for pt to return call. AW 

## 2016-03-25 ENCOUNTER — Other Ambulatory Visit (HOSPITAL_COMMUNITY): Payer: Self-pay | Admitting: Interventional Radiology

## 2016-03-25 DIAGNOSIS — I771 Stricture of artery: Secondary | ICD-10-CM

## 2016-03-26 ENCOUNTER — Other Ambulatory Visit: Payer: Self-pay | Admitting: Internal Medicine

## 2016-04-01 ENCOUNTER — Other Ambulatory Visit: Payer: Self-pay | Admitting: Internal Medicine

## 2016-04-02 DIAGNOSIS — I10 Essential (primary) hypertension: Secondary | ICD-10-CM | POA: Diagnosis not present

## 2016-04-02 DIAGNOSIS — Z7984 Long term (current) use of oral hypoglycemic drugs: Secondary | ICD-10-CM | POA: Diagnosis not present

## 2016-04-02 DIAGNOSIS — E1142 Type 2 diabetes mellitus with diabetic polyneuropathy: Secondary | ICD-10-CM | POA: Diagnosis not present

## 2016-04-06 ENCOUNTER — Encounter: Payer: Self-pay | Admitting: Internal Medicine

## 2016-04-13 ENCOUNTER — Ambulatory Visit (HOSPITAL_COMMUNITY)
Admission: RE | Admit: 2016-04-13 | Discharge: 2016-04-13 | Disposition: A | Discharge status: Home / Self Care | Payer: Medicare Other | Source: Ambulatory Visit | Attending: Interventional Radiology | Admitting: Interventional Radiology

## 2016-04-13 DIAGNOSIS — I6522 Occlusion and stenosis of left carotid artery: Secondary | ICD-10-CM | POA: Diagnosis not present

## 2016-04-13 DIAGNOSIS — I771 Stricture of artery: Secondary | ICD-10-CM

## 2016-04-13 LAB — VAS US CAROTID
LCCADSYS: -93 cm/s
LEFT ECA DIAS: -20 cm/s
LEFT VERTEBRAL DIAS: 19 cm/s
LICAPDIAS: -36 cm/s
Left CCA dist dias: -15 cm/s
Left CCA prox dias: 20 cm/s
Left CCA prox sys: 99 cm/s
Left ICA dist dias: -33 cm/s
Left ICA dist sys: -108 cm/s
Left ICA prox sys: -123 cm/s
RCCADSYS: -87 cm/s
RCCAPDIAS: -17 cm/s
RCCAPSYS: -91 cm/s
RIGHT ECA DIAS: -7 cm/s
RIGHT VERTEBRAL DIAS: -12 cm/s

## 2016-04-13 NOTE — Progress Notes (Signed)
VASCULAR LAB PRELIMINARY  PRELIMINARY  PRELIMINARY  PRELIMINARY  Carotid duplex completed.    Preliminary report:  Right ICA stent is patent. 1-39% left ICA plaquing. Vertebral artery flow is antegrade.   Pritesh Sobecki, RVT 04/13/2016, 10:04 AM

## 2016-04-21 ENCOUNTER — Telehealth (HOSPITAL_COMMUNITY): Payer: Self-pay

## 2016-04-21 NOTE — Telephone Encounter (Signed)
Pt's wife agreed to have pt f/u with US carotid in 6 months. AW

## 2016-04-22 ENCOUNTER — Encounter: Payer: Self-pay | Admitting: Internal Medicine

## 2016-04-23 ENCOUNTER — Other Ambulatory Visit: Payer: Self-pay

## 2016-04-23 MED ORDER — FLUTICASONE PROPIONATE 50 MCG/ACT NA SUSP: 2.0000 | Freq: Every day | NASAL | 3 refills | Status: DC

## 2016-04-24 ENCOUNTER — Encounter: Payer: Self-pay | Admitting: Cardiology

## 2016-04-27 ENCOUNTER — Encounter: Payer: Self-pay | Admitting: Cardiology

## 2016-04-27 ENCOUNTER — Ambulatory Visit (INDEPENDENT_AMBULATORY_CARE_PROVIDER_SITE_OTHER): Payer: Medicare Other | Admitting: Cardiology

## 2016-04-27 VITALS — BP 136/70 | HR 74 | Ht 70.0 in | Wt 202.0 lb

## 2016-04-27 DIAGNOSIS — I2510 Atherosclerotic heart disease of native coronary artery without angina pectoris: Secondary | ICD-10-CM | POA: Diagnosis not present

## 2016-04-27 DIAGNOSIS — I350 Nonrheumatic aortic (valve) stenosis: Secondary | ICD-10-CM

## 2016-04-27 DIAGNOSIS — E7800 Pure hypercholesterolemia, unspecified: Secondary | ICD-10-CM

## 2016-04-27 NOTE — Patient Instructions (Signed)
Medication Instructions: NO CHANGE  Procedures/Testing: Your physician has requested that you have an echocardiogram. Echocardiography is a painless test that uses sound waves to create images of your heart. It provides your doctor with information about the size and shape of your heart and how well your heart's chambers and valves are working. This procedure takes approximately one hour. There are no restrictions for this procedure.  Follow-Up: Your physician wants you to follow-up in: 6 MONTHS You will receive a reminder letter in the mail two months in advance. If you don't receive a letter, please call our office to schedule the follow-up appointment.   If you need a refill on your cardiac medications before your next appointment, please call your pharmacy. '
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
## 2016-04-28 ENCOUNTER — Encounter: Payer: Self-pay | Admitting: Internal Medicine

## 2016-04-28 ENCOUNTER — Ambulatory Visit: Payer: Medicare Other | Admitting: Cardiology

## 2016-04-28 ENCOUNTER — Ambulatory Visit (INDEPENDENT_AMBULATORY_CARE_PROVIDER_SITE_OTHER): Payer: Medicare Other | Admitting: Internal Medicine

## 2016-04-28 DIAGNOSIS — E1159 Type 2 diabetes mellitus with other circulatory complications: Secondary | ICD-10-CM | POA: Diagnosis not present

## 2016-04-28 DIAGNOSIS — I6350 Cerebral infarction due to unspecified occlusion or stenosis of unspecified cerebral artery: Secondary | ICD-10-CM | POA: Diagnosis not present

## 2016-04-28 DIAGNOSIS — G8929 Other chronic pain: Secondary | ICD-10-CM

## 2016-04-28 DIAGNOSIS — I350 Nonrheumatic aortic (valve) stenosis: Secondary | ICD-10-CM

## 2016-04-28 DIAGNOSIS — E538 Deficiency of other specified B group vitamins: Secondary | ICD-10-CM

## 2016-04-28 DIAGNOSIS — M5440 Lumbago with sciatica, unspecified side: Secondary | ICD-10-CM

## 2016-04-28 DIAGNOSIS — I10 Essential (primary) hypertension: Secondary | ICD-10-CM | POA: Diagnosis not present

## 2016-04-28 NOTE — Assessment & Plan Note (Signed)
On B12 

## 2016-04-28 NOTE — Progress Notes (Signed)
Pre-visit discussion using our clinic review tool. No additional management support is needed unless otherwise documented below in the visit note.  

## 2016-04-28 NOTE — Assessment & Plan Note (Signed)
Cont w/Plavix,Losartan HCT, Lipitor Labs

## 2016-04-28 NOTE — Assessment & Plan Note (Signed)
ROM exercises 

## 2016-04-28 NOTE — Assessment & Plan Note (Addendum)
Labs Labs w/Dr Buddy Duty

## 2016-04-28 NOTE — Assessment & Plan Note (Signed)
Labs  BP Readings from Last 3 Encounters:  04/28/16 126/80  04/27/16 136/70  12/27/15 (!) 160/80

## 2016-04-28 NOTE — Progress Notes (Signed)
Subjective:  [name]: Andrew Malone., male    DOB: 07/08/1940  Age: 76 y.o. MRN: [name]  CC: Follow-up (DM, HTn, aortic stenosis)   HPI FLOURNOY BAYERL Sr. presents for dyslipidemia, depression, CAD f/u  Outpatient Medications Prior to Visit  Medication Sig Dispense Refill  . acyclovir (ZOVIRAX) 200 MG capsule TAKE ONE CAPSULE BY MOUTH TWICE DAILY 60 capsule 3  . ammonium lactate (LAC-HYDRIN) 12 % lotion APPLY  TO THE AFFECTED AREA BID  6  . atorvastatin (LIPITOR) 20 MG tablet Take 1 tablet (20 mg total) by mouth daily. 30 tablet 11  . Cholecalciferol 1000 units tablet Take 1,000 Units by mouth daily.    . citalopram (CELEXA) 10 MG tablet TAKE 1 TABLET BY MOUTH ONCE DAILY 90 tablet 1  . clonazePAM (KLONOPIN) 0.5 MG tablet Take 1 tablet (0.5 mg total) by mouth 3 (three) times daily as needed. for anxiety 90 tablet 3  . clopidogrel (PLAVIX) 75 MG tablet TAKE 1 TABLET BY MOUTH EVERY DAY 90 tablet 1  . Cyanocobalamin (VITAMIN B-12) 1000 MCG SUBL Place 1 tablet (1,000 mcg total) under the tongue daily. 100 tablet 3  . fluticasone (FLONASE) 50 MCG/ACT nasal spray Place 2 sprays into both nostrils daily. 16 g 3  . loratadine (CLARITIN) 10 MG tablet Take 1 tablet (10 mg total) by mouth daily. 100 tablet 3  . losartan-hydrochlorothiazide (HYZAAR) 100-25 MG tablet Take 1 tablet by mouth daily.    . metFORMIN (GLUCOPHAGE-XR) 500 MG 24 hr tablet Take 500 mg by mouth 2 (two) times daily.  3  . pantoprazole (PROTONIX) 40 MG tablet TAKE 1 TABLET BY MOUTH EVERY DAY 90 tablet 2  . Tamsulosin HCl (FLOMAX) 0.4 MG CAPS Take 1 capsule by mouth at bedtime.     No facility-administered medications prior to visit.     ROS Review of Systems  Constitutional: Negative for appetite change, fatigue and unexpected weight change.  HENT: Negative for congestion, nosebleeds, sneezing, sore throat and trouble swallowing.   Eyes: Negative for itching and visual disturbance.  Respiratory: Negative for  cough.   Cardiovascular: Negative for chest pain, palpitations and leg swelling.  Gastrointestinal: Negative for abdominal distention, blood in stool, diarrhea and nausea.  Genitourinary: Negative for frequency and hematuria.  Musculoskeletal: Positive for arthralgias and back pain. Negative for gait problem, joint swelling and neck pain.  Skin: Negative for rash.  Neurological: Negative for dizziness, tremors, speech difficulty and weakness.  Psychiatric/Behavioral: Negative for agitation, dysphoric mood, sleep disturbance and suicidal ideas. The patient is nervous/anxious.     Objective:  BP 126/80   Pulse 78   Temp 98.2 F (36.8 C) (Oral)   Resp 16   Ht 5\' 11"  (1.803 m)   Wt 202 lb (91.6 kg)   SpO2 97%   BMI 28.17 kg/m   BP Readings from Last 3 Encounters:  04/28/16 126/80  04/27/16 136/70  12/27/15 (!) 160/80    Wt Readings from Last 3 Encounters:  04/28/16 202 lb (91.6 kg)  04/27/16 202 lb (91.6 kg)  12/27/15 201 lb (91.2 kg)    Physical Exam  Constitutional: He is oriented to person, place, and time. He appears well-developed. No distress.  NAD  HENT:  Mouth/Throat: Oropharynx is clear and moist.  Eyes: Conjunctivae are normal. Pupils are equal, round, and reactive to light.  Neck: Normal range of motion. No JVD present. No thyromegaly present.  Cardiovascular: Normal rate, regular rhythm and intact distal pulses.  Exam reveals no  gallop and no friction rub.   Murmur heard. Pulmonary/Chest: Effort normal and breath sounds normal. No respiratory distress. He has no wheezes. He has no rales. He exhibits no tenderness.  Abdominal: Soft. Bowel sounds are normal. He exhibits no distension and no mass. There is no tenderness. There is no rebound and no guarding.  Musculoskeletal: Normal range of motion. He exhibits no edema or tenderness.  Lymphadenopathy:    He has no cervical adenopathy.  Neurological: He is alert and oriented to person, place, and time. He has  normal reflexes. No cranial nerve deficit. He exhibits normal muscle tone. He displays a negative Romberg sign. Coordination and gait normal.  Skin: Skin is warm and dry. No rash noted.  Psychiatric: He has a normal mood and affect. His behavior is normal. Judgment and thought content normal.    Lab Results  Component Value Date   WBC 6.3 06/13/2015   HGB 12.7 (L) 06/13/2015   HCT 37.4 (L) 06/13/2015   PLT 206.0 06/13/2015   GLUCOSE 267 (H) 12/27/2015   CHOL 134 12/27/2015   TRIG 153.0 (H) 12/27/2015   HDL 34.60 (L) 12/27/2015   LDLDIRECT 89.5 06/27/2012   LDLCALC 68 12/27/2015   ALT 20 06/13/2015   AST 15 06/13/2015   NA 138 12/27/2015   K 4.5 12/27/2015   CL 104 12/27/2015   CREATININE 1.20 12/27/2015   BUN 27 (H) 12/27/2015   CO2 29 12/27/2015   TSH 1.20 12/27/2015   PSA 0.66 09/06/2007   INR 0.95 08/05/2011   HGBA1C 6.7 (H) 12/27/2015    No results found.  Assessment & Plan:   There are no diagnoses linked to this encounter. I am having Mr. Badders maintain his tamsulosin, Vitamin B-12, loratadine, ammonium lactate, metFORMIN, Cholecalciferol, pantoprazole, atorvastatin, clonazePAM, acyclovir, citalopram, clopidogrel, fluticasone, and losartan-hydrochlorothiazide.  No orders of the defined types were placed in this encounter.    Follow-up: No Follow-up on file.  Walker Kehr, MD

## 2016-04-28 NOTE — Assessment & Plan Note (Signed)
ECHO pending. ?

## 2016-05-01 ENCOUNTER — Telehealth: Payer: Self-pay | Admitting: Internal Medicine

## 2016-05-01 DIAGNOSIS — E1169 Type 2 diabetes mellitus with other specified complication: Secondary | ICD-10-CM

## 2016-05-01 DIAGNOSIS — E785 Hyperlipidemia, unspecified: Principal | ICD-10-CM

## 2016-05-01 DIAGNOSIS — N400 Enlarged prostate without lower urinary tract symptoms: Secondary | ICD-10-CM

## 2016-05-01 DIAGNOSIS — E291 Testicular hypofunction: Secondary | ICD-10-CM

## 2016-05-01 NOTE — Telephone Encounter (Signed)
The pts wife called to let Dr. Alain Malone know that his other doctor only did a urine and no other tests. The pt and his wife have an appointment on 07/30/16 and she is coming to have blood work done on 07/26/16. Would you like for him to have blood work done also? If so, can you put in an order in for him?  Thanks [employer]

## 2016-05-04 NOTE — Telephone Encounter (Signed)
Routing to dr plotikov, please advise ,thanks 

## 2016-05-06 NOTE — Telephone Encounter (Signed)
Pls order CMET, Lipids, A1c, PSA, TSH, CBC Thx

## 2016-05-08 NOTE — Telephone Encounter (Signed)
Left message advising that lab orders are in

## 2016-05-13 ENCOUNTER — Other Ambulatory Visit: Payer: Self-pay

## 2016-05-13 ENCOUNTER — Ambulatory Visit (HOSPITAL_COMMUNITY): Payer: Medicare Other | Attending: Cardiovascular Disease

## 2016-05-13 DIAGNOSIS — I350 Nonrheumatic aortic (valve) stenosis: Secondary | ICD-10-CM | POA: Diagnosis not present

## 2016-05-18 ENCOUNTER — Telehealth: Payer: Self-pay | Admitting: Cardiology

## 2016-05-18 NOTE — Telephone Encounter (Signed)
New message    Pt wife is calling to return call to Hilda Blades from last week about echo results.

## 2016-05-18 NOTE — Telephone Encounter (Signed)
PT AWARE OF ECHO RESULTS./CY 

## 2016-05-20 ENCOUNTER — Ambulatory Visit: Payer: Medicare Other | Admitting: Physician Assistant

## 2016-05-20 ENCOUNTER — Telehealth: Payer: Self-pay | Admitting: Physician Assistant

## 2016-05-20 NOTE — Telephone Encounter (Signed)
Noted  

## 2016-05-25 ENCOUNTER — Ambulatory Visit: Payer: Medicare Other | Admitting: Cardiology

## 2016-05-26 ENCOUNTER — Encounter: Payer: Self-pay | Admitting: Physician Assistant

## 2016-05-26 ENCOUNTER — Ambulatory Visit (INDEPENDENT_AMBULATORY_CARE_PROVIDER_SITE_OTHER): Payer: Medicare Other | Admitting: Physician Assistant

## 2016-05-26 ENCOUNTER — Telehealth: Payer: Self-pay | Admitting: Emergency Medicine

## 2016-05-26 VITALS — BP 138/64 | HR 82 | Ht 70.5 in | Wt 205.0 lb

## 2016-05-26 DIAGNOSIS — Z86010 Personal history of colonic polyps: Secondary | ICD-10-CM

## 2016-05-26 DIAGNOSIS — Z1211 Encounter for screening for malignant neoplasm of colon: Secondary | ICD-10-CM | POA: Diagnosis not present

## 2016-05-26 DIAGNOSIS — Z01818 Encounter for other preprocedural examination: Secondary | ICD-10-CM | POA: Diagnosis not present

## 2016-05-26 DIAGNOSIS — Z7901 Long term (current) use of anticoagulants: Secondary | ICD-10-CM | POA: Diagnosis not present

## 2016-05-26 MED ORDER — NA SULFATE-K SULFATE-MG SULF 17.5-3.13-1.6 GM/177ML PO SOLN: 1.0000 | ORAL | 0 refills | Status: DC

## 2016-05-26 NOTE — Telephone Encounter (Signed)
Spoke to patients wife she repeated and verified instructions and verbalized understanding.

## 2016-05-26 NOTE — Patient Instructions (Signed)

## 2016-05-26 NOTE — Progress Notes (Signed)
Reviewed. Agree with assessment and plans. Anderson Malta, if he is too high risk (history of stroke) to come off Plavix, I would perform his exam on Plavix. Please let me know. Thanks

## 2016-05-26 NOTE — Telephone Encounter (Signed)
  Andrew OGREN Sr. [birth year] [id number]  Dear L[place]tha [place]:  We have scheduled the above named patient for a(n) colonoscopy procedure. Our records show that (s)he is on anticoagulation therapy.  Please advise as to whether the patient may come of their therapy of Plavix 5 days prior to their procedure which is scheduled for 06-03-16.  Please route your response to Tinnie Gens, CMA or fax response to [phone].  Sincerely,    [facility]
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
## 2016-05-26 NOTE — Telephone Encounter (Signed)
Hold plavix 5 d prior to procedure and resume day after Kirk Ruths

## 2016-06-03 ENCOUNTER — Ambulatory Visit (AMBULATORY_SURGERY_CENTER): Payer: Medicare Other | Admitting: Internal Medicine

## 2016-06-03 ENCOUNTER — Encounter: Payer: Self-pay | Admitting: Internal Medicine

## 2016-06-03 VITALS — BP 121/64 | HR 71 | Temp 98.9°F | Resp 17 | Ht 70.0 in | Wt 205.0 lb

## 2016-06-03 DIAGNOSIS — Z86010 Personal history of colonic polyps: Secondary | ICD-10-CM

## 2016-06-03 DIAGNOSIS — Z1211 Encounter for screening for malignant neoplasm of colon: Secondary | ICD-10-CM | POA: Diagnosis not present

## 2016-06-03 DIAGNOSIS — D122 Benign neoplasm of ascending colon: Secondary | ICD-10-CM

## 2016-06-03 MED ORDER — SODIUM CHLORIDE 0.9 % IV SOLN: 500.0000 mL | INTRAVENOUS | Status: DC

## 2016-06-03 NOTE — Progress Notes (Signed)
A and O x3. Report to RN. Tolerated MAC anesthesia well.

## 2016-06-03 NOTE — Op Note (Signed)
[facility] Patient Name: Andrew Malone Procedure Date: 06/03/2016 2:47 PM MRN: [id number] Endoscopist: Docia Chuck. Henrene Pastor , MD Age: 76 Referring MD:  Date of Birth: June 03, 1940 Gender: Male Account #: [id number] Procedure:                Colonoscopy, with cold snare polypectomy x 1 Indications:              High risk colon cancer surveillance: Personal                            history of non-advanced adenoma, High risk colon                            cancer surveillance: Personal history of sessile                            serrated colon polyp (less than 10 mm in size) with                            no dysplasia. Previous examinations 2005, 2008, 2014 Medicines:                Monitored Anesthesia Care Procedure:                Pre-Anesthesia Assessment:                           - Prior to the procedure, a History and Physical                            was performed, and patient medications and                            allergies were reviewed. The patient's tolerance of                            previous anesthesia was also reviewed. The risks                            and benefits of the procedure and the sedation                            options and risks were discussed with the patient.                            All questions were answered, and informed consent                            was obtained. Prior Anticoagulants: The patient has                            taken Plavix (clopidogrel), last dose was 6 days                            prior to procedure. ASA Grade Assessment: III - A  patient with severe systemic disease. After                            reviewing the risks and benefits, the patient was                            deemed in satisfactory condition to undergo the                            procedure.                           After obtaining informed consent, the colonoscope                            was passed under  direct vision. Throughout the                            procedure, the patient's blood pressure, pulse, and                            oxygen saturations were monitored continuously. The                            Model CF-HQ190L [id number]) scope was introduced                            through the anus and advanced to the the cecum,                            identified by appendiceal orifice and ileocecal                            valve. The ileocecal valve, appendiceal orifice,                            and rectum were photographed. The quality of the                            bowel preparation was excellent. The colonoscopy                            was performed without difficulty. The patient                            tolerated the procedure well. The bowel preparation                            used was SUPREP. Scope In: 2:55:09 PM Scope Out: 3:06:40 PM Scope Withdrawal Time: 0 hours 9 minutes 11 seconds  Total Procedure Duration: 0 hours 11 minutes 31 seconds  Findings:                 A 4 mm polyp was found in the ascending colon. The  polyp was removed with a cold snare. Resection and                            retrieval were complete.                           Multiple small and large-mouthed diverticula were                            found in the entire colon.                           Internal hemorrhoids were found during retroflexion.                           The exam was otherwise without abnormality on                            direct and retroflexion views. Complications:            No immediate complications. Estimated blood loss:                            None. Estimated Blood Loss:     Estimated blood loss: none. Impression:               - One 4 mm polyp in the ascending colon, removed                            with a cold snare. Resected and retrieved.                           - Diverticulosis in the entire examined colon.                            - Internal hemorrhoids.                           - The examination was otherwise normal on direct                            and retroflexion views. Recommendation:           - Repeat colonoscopy in 5 years for surveillance.                           - Resume Plavix (clopidogrel) today at prior dose.                           - Patient has a contact number available for                            emergencies. The signs and symptoms of potential                            delayed complications were discussed with the  patient. Return to normal activities tomorrow.                            Written discharge instructions were provided to the                            patient.                           - Resume previous diet.                           - Continue present medications.                           - Await pathology results. Docia Chuck. Henrene Pastor, MD 06/03/2016 3:11:43 PM This report has been signed electronically.

## 2016-06-03 NOTE — Patient Instructions (Signed)
YOU HAD AN ENDOSCOPIC PROCEDURE TODAY AT [facility]:   Refer to the procedure report that was given to you for any specific questions about what was found during the examination.  If the procedure report does not answer your questions, please call your gastroenterologist to clarify.  If you requested that your care partner not be given the details of your procedure findings, then the procedure report has been included in a sealed envelope for you to review at your convenience later.  YOU SHOULD EXPECT: Some feelings of bloating in the abdomen. Passage of more gas than usual.  Walking can help get rid of the air that was put into your GI tract during the procedure and reduce the bloating. If you had a lower endoscopy (such as a colonoscopy or flexible sigmoidoscopy) you may notice spotting of blood in your stool or on the toilet paper. If you underwent a bowel prep for your procedure, you may not have a normal bowel movement for a few days.  Please Note:  You might notice some irritation and congestion in your nose or some drainage.  This is from the oxygen used during your procedure.  There is no need for concern and it should clear up in a day or so.  SYMPTOMS TO REPORT IMMEDIATELY:   Following lower endoscopy (colonoscopy or flexible sigmoidoscopy):  Excessive amounts of blood in the stool  Significant tenderness or worsening of abdominal pains  Swelling of the abdomen that is new, acute  Fever of 100F or higher   For urgent or emergent issues, a gastroenterologist can be reached at any hour by calling [phone].   DIET:  We do recommend a small meal at first, but then you may proceed to your regular diet.  Drink plenty of fluids but you should avoid alcoholic beverages for 24 hours.  ACTIVITY:  You should plan to take it easy for the rest of today and you should NOT DRIVE or use heavy machinery until tomorrow (because of the sedation medicines used during the test).     FOLLOW UP: Our staff will call the number listed on your records the next business day following your procedure to check on you and address any questions or concerns that you may have regarding the information given to you following your procedure. If we do not reach you, we will leave a message.  However, if you are feeling well and you are not experiencing any problems, there is no need to return our call.  We will assume that you have returned to your regular daily activities without incident.  If any biopsies were taken you will be contacted by phone or by letter within the next 1-3 weeks.  Please call us at [phone] if you have not heard about the biopsies in 3 weeks.    SIGNATURES/CONFIDENTIALITY: You and/or your care partner have signed paperwork which will be entered into your electronic medical record.  These signatures attest to the fact that that the information above on your After Visit Summary has been reviewed and is understood.  Full responsibility of the confidentiality of this discharge information lies with you and/or your care-partner.  Polyp, diverticulosis, high fiber diet and hemorrhoid information given.  Resume Plavix today.

## 2016-06-03 NOTE — Progress Notes (Signed)
Pt's states no medical or surgical changes since previsit or office visit. 

## 2016-06-03 NOTE — Progress Notes (Signed)
Called to room to assist during endoscopic procedure.  [name] and intended procedure confirmed with present staff. Received instructions for my participation in the procedure from the performing physician.  

## 2016-06-04 ENCOUNTER — Telehealth: Payer: Self-pay | Admitting: *Deleted

## 2016-06-04 NOTE — Telephone Encounter (Signed)
  Follow up Call-  Call back number 06/03/2016  Post procedure Call Back phone  # [phone]  Permission to leave phone message Yes  Some recent data might be hidden     Patient questions:  Do you have a fever, pain , or abdominal swelling? No. Pain Score  0 *  Have you tolerated food without any problems? Yes.    Have you been able to return to your normal activities? Yes.    Do you have any questions about your discharge instructions: Diet   No. Medications  No. Follow up visit  No.  Do you have questions or concerns about your Care? No.  Actions: * If pain score is 4 or above: No action needed, pain <4.

## 2016-06-09 ENCOUNTER — Encounter: Payer: Self-pay | Admitting: Internal Medicine

## 2016-06-11 ENCOUNTER — Other Ambulatory Visit: Payer: Self-pay | Admitting: Internal Medicine

## 2016-07-24 ENCOUNTER — Ambulatory Visit (INDEPENDENT_AMBULATORY_CARE_PROVIDER_SITE_OTHER): Payer: Medicare Other | Admitting: *Deleted

## 2016-07-24 ENCOUNTER — Other Ambulatory Visit (INDEPENDENT_AMBULATORY_CARE_PROVIDER_SITE_OTHER): Payer: Medicare Other

## 2016-07-24 VITALS — BP 136/68 | HR 79 | Resp 20 | Ht 70.0 in | Wt 203.0 lb

## 2016-07-24 DIAGNOSIS — N400 Enlarged prostate without lower urinary tract symptoms: Secondary | ICD-10-CM | POA: Diagnosis not present

## 2016-07-24 DIAGNOSIS — E1169 Type 2 diabetes mellitus with other specified complication: Secondary | ICD-10-CM | POA: Diagnosis not present

## 2016-07-24 DIAGNOSIS — E785 Hyperlipidemia, unspecified: Secondary | ICD-10-CM

## 2016-07-24 DIAGNOSIS — Z0000 Encounter for general adult medical examination without abnormal findings: Secondary | ICD-10-CM | POA: Diagnosis not present

## 2016-07-24 LAB — COMPREHENSIVE METABOLIC PANEL
ALT: 22 U/L (ref 0–53)
AST: 15 U/L (ref 0–37)
Albumin: 4.4 g/dL (ref 3.5–5.2)
Alkaline Phosphatase: 75 U/L (ref 39–117)
BUN: 30 mg/dL — ABNORMAL HIGH (ref 6–23)
CALCIUM: 9.4 mg/dL (ref 8.4–10.5)
CHLORIDE: 101 meq/L (ref 96–112)
CO2: 29 meq/L (ref 19–32)
Creatinine, Ser: 1.42 mg/dL (ref 0.40–1.50)
GFR: 51.82 mL/min — AB (ref >60.00)
Glucose, Bld: 200 mg/dL — ABNORMAL HIGH (ref 70–99)
Potassium: 4.7 mEq/L (ref 3.5–5.1)
Sodium: 137 mEq/L (ref 135–145)
Total Bilirubin: 0.6 mg/dL (ref 0.2–1.2)
Total Protein: 6.6 g/dL (ref 6.0–8.3)

## 2016-07-24 LAB — CBC
HCT: 35.7 % — ABNORMAL LOW (ref 39.0–52.0)
HEMOGLOBIN: 12.2 g/dL — AB (ref 13.0–17.0)
MCHC: 34.2 g/dL (ref 30.0–36.0)
MCV: 86 fl (ref 78.0–100.0)
PLATELETS: 219 10*3/uL (ref 150.0–400.0)
RBC: 4.15 Mil/uL — ABNORMAL LOW (ref 4.22–5.81)
RDW: 13.3 % (ref 11.5–15.5)
WBC: 6 10*3/uL (ref 4.0–10.5)

## 2016-07-24 LAB — LIPID PANEL
CHOL/HDL RATIO: 5
CHOLESTEROL: 128 mg/dL (ref 0–200)
HDL: 26.1 mg/dL — ABNORMAL LOW (ref >39.00)
NonHDL: 102.24
TRIGLYCERIDES: 252 mg/dL — AB (ref 0.0–149.0)
VLDL: 50.4 mg/dL — AB (ref 0.0–40.0)

## 2016-07-24 LAB — LDL CHOLESTEROL, DIRECT: Direct LDL: 51 mg/dL

## 2016-07-24 LAB — PSA: PSA: 1.75 ng/mL (ref 0.10–4.00)

## 2016-07-24 LAB — TSH: TSH: 2.08 u[IU]/mL (ref 0.35–4.50)

## 2016-07-24 LAB — HEMOGLOBIN A1C: Hgb A1c MFr Bld: 7.8 % — ABNORMAL HIGH (ref 4.6–6.5)

## 2016-07-24 NOTE — Patient Instructions (Signed)
Continue to eat heart healthy diet (full of fruits, vegetables, whole grains, lean protein, water--limit salt, fat, and sugar intake) and increase physical activity as tolerated.  Continue doing brain stimulating activities (puzzles, reading, adult coloring books, staying active) to keep memory sharp.    Andrew Malone , Thank you for taking time to come for your Medicare Wellness Visit. I appreciate your ongoing commitment to your health goals. Please review the following plan we discussed and let me know if I can assist you in the future.   These are the goals we discussed: Goals      Patient Stated   . eat more healthy (pt-stated)          To eat more healthy; Bought a "air fryer" eat fat free;       Other   . Be more active in the church, around the house, stay as active as possible    . Exercise 150 minutes per week (moderate activity)          To start walking with wife x 5 days a week        This is a list of the screening recommended for you and due dates:  Health Maintenance  Topic Date Due  . Complete foot exam   09/21/1950  . Eye exam for diabetics  09/21/1950  . Hemoglobin A1C  06/26/2016  . Flu Shot  09/30/2016  . Tetanus Vaccine  10/17/2018  . Colon Cancer Screening  06/03/2021  . Pneumonia vaccines  Completed
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
## 2016-07-24 NOTE — Progress Notes (Signed)
Pre visit review using our clinic review tool, if applicable. No additional management support is needed unless otherwise documented below in the visit note. 

## 2016-07-30 ENCOUNTER — Encounter: Payer: Self-pay | Admitting: Internal Medicine

## 2016-07-30 ENCOUNTER — Ambulatory Visit (INDEPENDENT_AMBULATORY_CARE_PROVIDER_SITE_OTHER): Payer: Medicare Other | Admitting: Internal Medicine

## 2016-07-30 DIAGNOSIS — I2510 Atherosclerotic heart disease of native coronary artery without angina pectoris: Secondary | ICD-10-CM | POA: Diagnosis not present

## 2016-07-30 DIAGNOSIS — N401 Enlarged prostate with lower urinary tract symptoms: Secondary | ICD-10-CM | POA: Diagnosis not present

## 2016-07-30 DIAGNOSIS — E1159 Type 2 diabetes mellitus with other circulatory complications: Secondary | ICD-10-CM

## 2016-07-30 DIAGNOSIS — I10 Essential (primary) hypertension: Secondary | ICD-10-CM | POA: Diagnosis not present

## 2016-07-30 DIAGNOSIS — I6350 Cerebral infarction due to unspecified occlusion or stenosis of unspecified cerebral artery: Secondary | ICD-10-CM

## 2016-07-30 MED ORDER — ZOSTER VAC RECOMB ADJUVANTED 50 MCG/0.5ML IM SUSR: 0.5000 mL | Freq: Once | INTRAMUSCULAR | 1 refills | Status: AC

## 2016-07-30 MED ORDER — LOSARTAN POTASSIUM-HCTZ 100-25 MG PO TABS: ORAL_TABLET | ORAL | 11 refills | Status: DC

## 2016-07-30 MED ORDER — EMPAGLIFLOZIN 10 MG PO TABS: 10.0000 mg | ORAL_TABLET | Freq: Every day | ORAL | 11 refills | Status: DC

## 2016-07-30 NOTE — Assessment & Plan Note (Addendum)
take Hyzaar 1/2 a day

## 2016-07-30 NOTE — Assessment & Plan Note (Signed)
Dr  Stanford Breed Plavix, Losartan HCT, Lipitor

## 2016-07-30 NOTE — Assessment & Plan Note (Addendum)
Worse Added Jardiance On Metformin

## 2016-07-30 NOTE — Progress Notes (Signed)
Subjective:  [name]: Andrew Malone., male    DOB: May 11, 1940  Age: 76 y.o. MRN: [id number]  CC: No chief complaint on file.   HPI Andrew Malone Sr. presents for DM, HTN, CAD f/u  Outpatient Medications Prior to Visit  Medication Sig Dispense Refill  . acyclovir (ZOVIRAX) 200 MG capsule TAKE ONE CAPSULE BY MOUTH TWICE DAILY 60 capsule 3  . ammonium lactate (LAC-HYDRIN) 12 % lotion APPLY  TO THE AFFECTED AREA BID  6  . atorvastatin (LIPITOR) 20 MG tablet Take 1 tablet (20 mg total) by mouth daily. 30 tablet 11  . Cholecalciferol 1000 units tablet Take 1,000 Units by mouth daily.    . citalopram (CELEXA) 10 MG tablet Take 10 mg by mouth daily as needed.    . clopidogrel (PLAVIX) 75 MG tablet TAKE 1 TABLET BY MOUTH EVERY DAY 90 tablet 1  . Cyanocobalamin (VITAMIN B-12) 1000 MCG SUBL Place 1 tablet (1,000 mcg total) under the tongue daily. 100 tablet 3  . fluticasone (FLONASE) 50 MCG/ACT nasal spray Place 2 sprays into both nostrils daily. 16 g 3  . loratadine (CLARITIN) 10 MG tablet Take 1 tablet (10 mg total) by mouth daily. 100 tablet 3  . losartan-hydrochlorothiazide (HYZAAR) 100-25 MG tablet Take 1 tablet by mouth daily.    . metFORMIN (GLUCOPHAGE-XR) 500 MG 24 hr tablet Take 500 mg by mouth 2 (two) times daily.  3  . pantoprazole (PROTONIX) 40 MG tablet TAKE 1 TABLET BY MOUTH EVERY DAY 90 tablet 1  . Tamsulosin HCl (FLOMAX) 0.4 MG CAPS Take 1 capsule by mouth at bedtime.     Facility-Administered Medications Prior to Visit  Medication Dose Route Frequency Provider Last Rate Last Dose  . 0.9 %  sodium chloride infusion  500 mL Intravenous Continuous Irene Shipper, MD        ROS Review of Systems  Constitutional: Positive for fatigue. Negative for appetite change and unexpected weight change.  HENT: Negative for congestion, nosebleeds, sneezing, sore throat and trouble swallowing.   Eyes: Negative for itching and visual disturbance.  Respiratory: Negative for cough.     Cardiovascular: Negative for chest pain, palpitations and leg swelling.  Gastrointestinal: Negative for abdominal distention, blood in stool, diarrhea and nausea.  Genitourinary: Negative for frequency and hematuria.  Musculoskeletal: Positive for back pain. Negative for gait problem, joint swelling and neck pain.  Skin: Negative for rash.  Neurological: Negative for dizziness, tremors, speech difficulty and weakness.  Psychiatric/Behavioral: Negative for agitation, dysphoric mood, sleep disturbance and suicidal ideas. The patient is not nervous/anxious.     Objective:  BP (!) 116/58 (BP Location: Left Arm, Patient Position: Sitting, Cuff Size: Large)   Pulse 88   Temp 98.4 F (36.9 C) (Oral)   Ht 5\' 10"  (1.778 m)   Wt 203 lb (92.1 kg)   SpO2 98%   BMI 29.13 kg/m   BP Readings from Last 3 Encounters:  07/30/16 (!) 116/58  07/24/16 136/68  06/03/16 121/64    Wt Readings from Last 3 Encounters:  07/30/16 203 lb (92.1 kg)  07/24/16 203 lb (92.1 kg)  06/03/16 205 lb (93 kg)    Physical Exam  Constitutional: He is oriented to person, place, and time. He appears well-developed. No distress.  NAD  HENT:  Mouth/Throat: Oropharynx is clear and moist.  Eyes: Conjunctivae are normal. Pupils are equal, round, and reactive to light.  Neck: Normal range of motion. No JVD present. No thyromegaly present.  Cardiovascular:  Normal rate, regular rhythm, normal heart sounds and intact distal pulses.  Exam reveals no gallop and no friction rub.   No murmur heard. Pulmonary/Chest: Effort normal and breath sounds normal. No respiratory distress. He has no wheezes. He has no rales. He exhibits no tenderness.  Abdominal: Soft. Bowel sounds are normal. He exhibits no distension and no mass. There is no tenderness. There is no rebound and no guarding.  Musculoskeletal: Normal range of motion. He exhibits no edema or tenderness.  Lymphadenopathy:    He has no cervical adenopathy.  Neurological:  He is alert and oriented to person, place, and time. He has normal reflexes. No cranial nerve deficit. He exhibits normal muscle tone. He displays a negative Romberg sign. Coordination and gait normal.  Skin: Skin is warm and dry. No rash noted.  Psychiatric: He has a normal mood and affect. His behavior is normal. Judgment and thought content normal.    Lab Results  Component Value Date   WBC 6.0 07/24/2016   HGB 12.2 (L) 07/24/2016   HCT 35.7 (L) 07/24/2016   PLT 219.0 07/24/2016   GLUCOSE 200 (H) 07/24/2016   CHOL 128 07/24/2016   TRIG 252.0 (H) 07/24/2016   HDL 26.10 (L) 07/24/2016   LDLDIRECT 51.0 07/24/2016   LDLCALC 68 12/27/2015   ALT 22 07/24/2016   AST 15 07/24/2016   NA 137 07/24/2016   K 4.7 07/24/2016   CL 101 07/24/2016   CREATININE 1.42 07/24/2016   BUN 30 (H) 07/24/2016   CO2 29 07/24/2016   TSH 2.08 07/24/2016   PSA 1.75 07/24/2016   INR 0.95 08/05/2011   HGBA1C 7.8 (H) 07/24/2016    No results found.  Assessment & Plan:   There are no diagnoses linked to this encounter. I am having Mr. Heggs maintain his tamsulosin, Vitamin B-12, loratadine, ammonium lactate, metFORMIN, Cholecalciferol, atorvastatin, acyclovir, clopidogrel, fluticasone, losartan-hydrochlorothiazide, citalopram, and pantoprazole. We will continue to administer sodium chloride.  No orders of the defined types were placed in this encounter.    Follow-up: No Follow-up on file.  Walker Kehr, MD

## 2016-07-30 NOTE — Assessment & Plan Note (Signed)
-   Flomax 

## 2016-09-10 ENCOUNTER — Other Ambulatory Visit: Payer: Self-pay

## 2016-09-10 MED ORDER — ATORVASTATIN CALCIUM 20 MG PO TABS: 20.0000 mg | ORAL_TABLET | Freq: Every day | ORAL | 11 refills | Status: DC

## 2016-09-16 ENCOUNTER — Telehealth: Payer: Self-pay | Admitting: Internal Medicine

## 2016-09-16 MED ORDER — TELMISARTAN 20 MG PO TABS: 20.0000 mg | ORAL_TABLET | Freq: Every day | ORAL | 11 refills | Status: DC

## 2016-09-16 NOTE — Telephone Encounter (Signed)
Ok Micardis instead

## 2016-09-16 NOTE — Telephone Encounter (Signed)
Pt wife called in and said that the lasartin is causing him dizzy and makes his heart beat faster and feel nervous.  He would like to try something else

## 2016-09-17 ENCOUNTER — Telehealth: Payer: Self-pay | Admitting: *Deleted

## 2016-09-17 MED ORDER — TELMISARTAN 20 MG PO TABS: 20.0000 mg | ORAL_TABLET | Freq: Every day | ORAL | 3 refills | Status: DC

## 2016-09-17 MED ORDER — ATORVASTATIN CALCIUM 20 MG PO TABS: 20.0000 mg | ORAL_TABLET | Freq: Every day | ORAL | 3 refills | Status: DC

## 2016-09-17 MED ORDER — LISINOPRIL 10 MG PO TABS: 10.0000 mg | ORAL_TABLET | Freq: Every day | ORAL | 3 refills | Status: DC

## 2016-09-17 NOTE — Telephone Encounter (Signed)
Ok Lisinopril 10 mg/d Thx

## 2016-09-17 NOTE — Telephone Encounter (Signed)
Wife call back she spoke w/ the insurance company they gave her 3 alternative that pt can use from the tier 1 that is more affordable. Lisinopril, Metoprolol, or Amlodipine. Wife is wanting to know if he is able to take either one of these...Andrew Malone

## 2016-09-17 NOTE — Telephone Encounter (Signed)
Rec'd call back from pt wife pharmacist inform them that the Telmisartan that was rx is a higher price. It will cost over $100 dollars just for a 30 day supply. Pharmacist inform them that the " sartan" ingredient is also in the telisartan which that is what making him dizzy. Wife want to know [place] there anything else that he can take w/o the ingredient sartan in it...Andrew Malone

## 2016-09-17 NOTE — Telephone Encounter (Signed)
Notified wife w/MD response. Wife states [facility] called and stated they had 2 rx's there for pick-up, and she is wanting to know why rx's was sent there. They had not used [facility] in years we have told office that we use [facility] now. Inform wife per chart pharmacy was never updated, even though [facility] was added they never took [facility] out his chart so when MD sent rx it defaulted back to [facility]. inform her will call [facility] and cancel the rx's there and send to correct pharmacy which is [facility] w/90 day. Called [facility] spoke w/pharmacy Josh cancel the two rx's there and sent new scripts to [facility] for pt Micardis & lipitor...lmb

## 2016-09-18 MED ORDER — LISINOPRIL 10 MG PO TABS: 10.0000 mg | ORAL_TABLET | Freq: Every day | ORAL | 3 refills | Status: DC

## 2016-09-18 NOTE — Telephone Encounter (Signed)
Notified pt wife w/MD recommendation. Resent rx electronically to [facility]...Johny Chess

## 2016-09-22 ENCOUNTER — Other Ambulatory Visit (HOSPITAL_COMMUNITY): Payer: Self-pay | Admitting: Interventional Radiology

## 2016-09-22 DIAGNOSIS — I771 Stricture of artery: Secondary | ICD-10-CM

## 2016-09-30 ENCOUNTER — Ambulatory Visit (HOSPITAL_COMMUNITY)
Admission: RE | Admit: 2016-09-30 | Discharge: 2016-09-30 | Disposition: A | Discharge status: Home / Self Care | Payer: Medicare Other | Source: Ambulatory Visit | Attending: Interventional Radiology | Admitting: Interventional Radiology

## 2016-09-30 DIAGNOSIS — I771 Stricture of artery: Secondary | ICD-10-CM

## 2016-09-30 DIAGNOSIS — I6522 Occlusion and stenosis of left carotid artery: Secondary | ICD-10-CM | POA: Insufficient documentation

## 2016-09-30 NOTE — Progress Notes (Signed)
VASCULAR LAB PRELIMINARY  PRELIMINARY  PRELIMINARY  PRELIMINARY  Carotid duplex completed.    Preliminary report:  Right - Patent ICA stent. Left - 1% to 39% ICA stenosis. Bilateral - Vertebral artery flow is antegrade.  [place], RVS 09/30/2016, 12:42 PM

## 2016-10-02 ENCOUNTER — Other Ambulatory Visit: Payer: Self-pay | Admitting: Internal Medicine

## 2016-10-02 LAB — VAS US CAROTID
LCCADDIAS: -22 cm/s
LCCAPDIAS: 17 cm/s
LEFT ECA DIAS: -18 cm/s
LEFT VERTEBRAL DIAS: -20 cm/s
LICADSYS: -98 cm/s
LICAPDIAS: -37 cm/s
Left CCA dist sys: -113 cm/s
Left CCA prox sys: 125 cm/s
Left ICA dist dias: -27 cm/s
Left ICA prox sys: -128 cm/s
RCCADSYS: -92 cm/s
RCCAPDIAS: 22 cm/s
RCCAPSYS: 112 cm/s
RIGHT ECA DIAS: -16 cm/s
RIGHT VERTEBRAL DIAS: -10 cm/s

## 2016-10-05 ENCOUNTER — Encounter: Payer: Self-pay | Admitting: Cardiology
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
## 2016-10-20 ENCOUNTER — Ambulatory Visit (INDEPENDENT_AMBULATORY_CARE_PROVIDER_SITE_OTHER): Payer: Medicare Other | Admitting: Cardiology

## 2016-10-20 ENCOUNTER — Encounter: Payer: Self-pay | Admitting: Cardiology

## 2016-10-20 ENCOUNTER — Other Ambulatory Visit (INDEPENDENT_AMBULATORY_CARE_PROVIDER_SITE_OTHER): Payer: Medicare Other

## 2016-10-20 VITALS — BP 116/62 | HR 85 | Ht 70.0 in | Wt 200.0 lb

## 2016-10-20 DIAGNOSIS — I10 Essential (primary) hypertension: Secondary | ICD-10-CM

## 2016-10-20 DIAGNOSIS — I6350 Cerebral infarction due to unspecified occlusion or stenosis of unspecified cerebral artery: Secondary | ICD-10-CM

## 2016-10-20 DIAGNOSIS — E7800 Pure hypercholesterolemia, unspecified: Secondary | ICD-10-CM | POA: Diagnosis not present

## 2016-10-20 DIAGNOSIS — I2510 Atherosclerotic heart disease of native coronary artery without angina pectoris: Secondary | ICD-10-CM

## 2016-10-20 DIAGNOSIS — E1159 Type 2 diabetes mellitus with other circulatory complications: Secondary | ICD-10-CM

## 2016-10-20 DIAGNOSIS — I350 Nonrheumatic aortic (valve) stenosis: Secondary | ICD-10-CM

## 2016-10-20 LAB — BASIC METABOLIC PANEL
BUN: 21 mg/dL (ref 6–23)
CALCIUM: 9.7 mg/dL (ref 8.4–10.5)
CO2: 32 mEq/L (ref 19–32)
Chloride: 104 mEq/L (ref 96–112)
Creatinine, Ser: 1.21 mg/dL (ref 0.40–1.50)
GFR: 62.29 mL/min (ref >60.00)
GLUCOSE: 139 mg/dL — AB (ref 70–99)
Potassium: 4.2 mEq/L (ref 3.5–5.1)
SODIUM: 140 meq/L (ref 135–145)

## 2016-10-20 LAB — HEMOGLOBIN A1C: Hgb A1c MFr Bld: 7 % — ABNORMAL HIGH (ref 4.6–6.5)

## 2016-10-20 MED ORDER — LOSARTAN POTASSIUM 25 MG PO TABS: 25.0000 mg | ORAL_TABLET | Freq: Every day | ORAL | 3 refills | Status: DC

## 2016-10-20 NOTE — Patient Instructions (Signed)
Medication Instructions:   STOP LISINOPRIL  START LOSARTAN 25 MG ONCE DAILY  Follow-Up:  Your physician wants you to follow-up in: [place] will receive a reminder letter in the mail two months in advance. If you don't receive a letter, please call our office to schedule the follow-up appointment.   If you need a refill on your cardiac medications before your next appointment, please call your pharmacy.

## 2016-10-26 ENCOUNTER — Ambulatory Visit (INDEPENDENT_AMBULATORY_CARE_PROVIDER_SITE_OTHER)
Admission: RE | Admit: 2016-10-26 | Discharge: 2016-10-26 | Disposition: A | Discharge status: Home / Self Care | Payer: Medicare Other | Source: Ambulatory Visit | Attending: Internal Medicine | Admitting: Internal Medicine

## 2016-10-26 ENCOUNTER — Ambulatory Visit (INDEPENDENT_AMBULATORY_CARE_PROVIDER_SITE_OTHER): Payer: Medicare Other | Admitting: Internal Medicine

## 2016-10-26 ENCOUNTER — Encounter: Payer: Self-pay | Admitting: Internal Medicine

## 2016-10-26 VITALS — BP 134/72 | HR 84 | Temp 98.9°F | Ht 70.0 in | Wt 201.0 lb

## 2016-10-26 DIAGNOSIS — R059 Cough, unspecified: Secondary | ICD-10-CM | POA: Insufficient documentation

## 2016-10-26 DIAGNOSIS — I10 Essential (primary) hypertension: Secondary | ICD-10-CM

## 2016-10-26 DIAGNOSIS — E538 Deficiency of other specified B group vitamins: Secondary | ICD-10-CM

## 2016-10-26 DIAGNOSIS — R05 Cough: Secondary | ICD-10-CM

## 2016-10-26 DIAGNOSIS — Z23 Encounter for immunization: Secondary | ICD-10-CM | POA: Diagnosis not present

## 2016-10-26 DIAGNOSIS — I6350 Cerebral infarction due to unspecified occlusion or stenosis of unspecified cerebral artery: Secondary | ICD-10-CM

## 2016-10-26 DIAGNOSIS — E785 Hyperlipidemia, unspecified: Secondary | ICD-10-CM | POA: Diagnosis not present

## 2016-10-26 MED ORDER — ACYCLOVIR 200 MG PO CAPS: 200.0000 mg | ORAL_CAPSULE | Freq: Two times a day (BID) | ORAL | 3 refills | Status: DC

## 2016-10-26 NOTE — Assessment & Plan Note (Signed)
Lisinopril d/c'd

## 2016-10-26 NOTE — Progress Notes (Signed)
Subjective:  [name]: Andrew Mechanic., male    DOB: 22-Oct-1940  Age: 76 y.o. MRN: [id number]  CC: No chief complaint on file.   HPI Andrew DYKE Sr. presents for HTN, DM, GERD, dyslipidemia, anxiety f/u C/o cough - ACE d/c'd  Outpatient Medications Prior to Visit  Medication Sig Dispense Refill  . acyclovir (ZOVIRAX) 200 MG capsule TAKE ONE CAPSULE BY MOUTH TWICE DAILY 60 capsule 3  . ammonium lactate (LAC-HYDRIN) 12 % lotion APPLY  TO THE AFFECTED AREA BID  6  . atorvastatin (LIPITOR) 20 MG tablet Take 1 tablet (20 mg total) by mouth daily. 90 tablet 3  . Cholecalciferol 1000 units tablet Take 1,000 Units by mouth daily.    . citalopram (CELEXA) 10 MG tablet Take 10 mg by mouth daily as needed.    . clopidogrel (PLAVIX) 75 MG tablet Take 1 tablet (75 mg total) by mouth daily. 90 tablet 1  . Cyanocobalamin (VITAMIN B-12) 1000 MCG SUBL Place 1 tablet (1,000 mcg total) under the tongue daily. 100 tablet 3  . empagliflozin (JARDIANCE) 10 MG TABS tablet Take 10 mg by mouth daily. 30 tablet 11  . fluticasone (FLONASE) 50 MCG/ACT nasal spray Place 2 sprays into both nostrils daily. 16 g 3  . loratadine (CLARITIN) 10 MG tablet Take 1 tablet (10 mg total) by mouth daily. 100 tablet 3  . losartan (COZAAR) 25 MG tablet Take 1 tablet (25 mg total) by mouth daily. 90 tablet 3  . metFORMIN (GLUCOPHAGE-XR) 500 MG 24 hr tablet Take 500 mg by mouth 2 (two) times daily.  3  . pantoprazole (PROTONIX) 40 MG tablet TAKE 1 TABLET BY MOUTH EVERY DAY 90 tablet 1  . Tamsulosin HCl (FLOMAX) 0.4 MG CAPS Take 1 capsule by mouth at bedtime.     Facility-Administered Medications Prior to Visit  Medication Dose Route Frequency Provider Last Rate Last Dose  . 0.9 %  sodium chloride infusion  500 mL Intravenous Continuous Irene Shipper, MD        ROS Review of Systems  Constitutional: Negative for appetite change, fatigue and unexpected weight change.  HENT: Negative for congestion, nosebleeds,  sneezing, sore throat and trouble swallowing.   Eyes: Negative for itching and visual disturbance.  Respiratory: Positive for cough.   Cardiovascular: Negative for chest pain, palpitations and leg swelling.  Gastrointestinal: Negative for abdominal distention, blood in stool, diarrhea and nausea.  Genitourinary: Negative for frequency and hematuria.  Musculoskeletal: Negative for back pain, gait problem, joint swelling and neck pain.  Skin: Negative for rash.  Neurological: Negative for dizziness, tremors, speech difficulty and weakness.  Psychiatric/Behavioral: Negative for agitation, dysphoric mood and sleep disturbance. The patient is not nervous/anxious.     Objective:  BP 134/72 (BP Location: Left Arm, Patient Position: Sitting, Cuff Size: Large)   Pulse 84   Temp 98.9 F (37.2 C) (Oral)   Ht 5\' 10"  (1.778 m)   Wt 201 lb (91.2 kg)   SpO2 98%   BMI 28.84 kg/m   BP Readings from Last 3 Encounters:  10/26/16 134/72  10/20/16 116/62  07/30/16 (!) 116/58    Wt Readings from Last 3 Encounters:  10/26/16 201 lb (91.2 kg)  10/20/16 200 lb (90.7 kg)  07/30/16 203 lb (92.1 kg)    Physical Exam  Constitutional: He is oriented to person, place, and time. He appears well-developed. No distress.  NAD  HENT:  Mouth/Throat: Oropharynx is clear and moist.  Eyes: Pupils are equal,  round, and reactive to light. Conjunctivae are normal.  Neck: Normal range of motion. No JVD present. No thyromegaly present.  Cardiovascular: Normal rate, regular rhythm, normal heart sounds and intact distal pulses.  Exam reveals no gallop and no friction rub.   No murmur heard. Pulmonary/Chest: Effort normal and breath sounds normal. No respiratory distress. He has no wheezes. He has no rales. He exhibits no tenderness.  Abdominal: Soft. Bowel sounds are normal. He exhibits no distension and no mass. There is no tenderness. There is no rebound and no guarding.  Musculoskeletal: Normal range of motion. He  exhibits no edema or tenderness.  Lymphadenopathy:    He has no cervical adenopathy.  Neurological: He is alert and oriented to person, place, and time. He has normal reflexes. No cranial nerve deficit. He exhibits normal muscle tone. He displays a negative Romberg sign. Coordination and gait normal.  Skin: Skin is warm and dry. No rash noted.  Psychiatric: He has a normal mood and affect. His behavior is normal. Judgment and thought content normal.    Lab Results  Component Value Date   WBC 6.0 07/24/2016   HGB 12.2 (L) 07/24/2016   HCT 35.7 (L) 07/24/2016   PLT 219.0 07/24/2016   GLUCOSE 139 (H) 10/20/2016   CHOL 128 07/24/2016   TRIG 252.0 (H) 07/24/2016   HDL 26.10 (L) 07/24/2016   LDLDIRECT 51.0 07/24/2016   LDLCALC 68 12/27/2015   ALT 22 07/24/2016   AST 15 07/24/2016   NA 140 10/20/2016   K 4.2 10/20/2016   CL 104 10/20/2016   CREATININE 1.21 10/20/2016   BUN 21 10/20/2016   CO2 32 10/20/2016   TSH 2.08 07/24/2016   PSA 1.75 07/24/2016   INR 0.95 08/05/2011   HGBA1C 7.0 (H) 10/20/2016    No results found.  Assessment & Plan:   There are no diagnoses linked to this encounter. I am having Andrew Malone maintain his tamsulosin, Vitamin B-12, loratadine, ammonium lactate, metFORMIN, Cholecalciferol, acyclovir, fluticasone, citalopram, pantoprazole, empagliflozin, atorvastatin, clopidogrel, and losartan. We will continue to administer sodium chloride.  No orders of the defined types were placed in this encounter.    Follow-up: No Follow-up on file.  Walker Kehr, MD

## 2016-10-26 NOTE — Assessment & Plan Note (Signed)
lipitor

## 2016-10-26 NOTE — Assessment & Plan Note (Signed)
Lisinopril d/c'd CXR

## 2016-10-26 NOTE — Assessment & Plan Note (Signed)
On B12 

## 2016-10-28 DIAGNOSIS — Z23 Encounter for immunization: Secondary | ICD-10-CM | POA: Diagnosis not present

## 2016-10-28 NOTE — Addendum Note (Signed)
Addended by: Karren Cobble on: 10/28/2016 04:35 PM   Modules accepted: Orders

## 2016-10-30 ENCOUNTER — Ambulatory Visit: Payer: Medicare Other | Admitting: Internal Medicine

## 2016-11-06 ENCOUNTER — Ambulatory Visit: Payer: Medicare Other | Admitting: Podiatry

## 2016-11-08 ENCOUNTER — Telehealth: Payer: Self-pay | Admitting: Internal Medicine

## 2016-11-08 NOTE — Telephone Encounter (Signed)
Patient had Sbp of 184. Called for advice. Told them to increase dose of losartan to 50 mg daily. She will call back if bp is not controlled.

## 2016-11-09 ENCOUNTER — Telehealth: Payer: Self-pay | Admitting: Internal Medicine

## 2016-11-09 NOTE — Telephone Encounter (Signed)
Wife called again that patient is having ssbp in 200s and 190s. Told to restart his hctz/losartan and call in the morning for further guidance. If bp is not controlled with the medication then come to the ER to get evaluated.

## 2016-11-10 ENCOUNTER — Telehealth: Payer: Self-pay | Admitting: Cardiology

## 2016-11-10 DIAGNOSIS — Z79899 Other long term (current) drug therapy: Secondary | ICD-10-CM

## 2016-11-10 MED ORDER — LOSARTAN POTASSIUM-HCTZ 100-12.5 MG PO TABS: 1.0000 | ORAL_TABLET | Freq: Every day | ORAL | 5 refills | Status: DC

## 2016-11-10 NOTE — Telephone Encounter (Signed)
Returned call to patient's wife. Discussed HTN concerns, pt's continued high readings. She did note patient had improvement of BPs after taking the losartan/HCTZ. I discussed w her Dr. Jacalyn Lefevre recommendations to adjust dosing.  Pt will stop losartan 25mg , take hyzaar 100/12.5mg  going forward. I've advised to follow BP, & have the repeat BMET in 1 week.  Meds called to pharmacy of preference, labwork ordered. Wife aware to call if any further concerns. She verbalized understanding of instructions and thanks.

## 2016-11-10 NOTE — Telephone Encounter (Signed)
See other note, have advised patient's wife regarding recommendations.

## 2016-11-10 NOTE — Telephone Encounter (Signed)
Change to hyzaar 100/12.5 mg daily, bmet 1 week Kirk Ruths

## 2016-11-10 NOTE — Telephone Encounter (Signed)
Andrew Perla, MD  Cristopher Estimable, RN 3 hours ago (5:31 AM)      Change to hyzaar 100/12.5 mg daily, bmet 1 week Kirk Ruths

## 2016-11-10 NOTE — Telephone Encounter (Signed)
New message     Per wife they had to call the on call last and he had her to adjust the medication til today, please call to advise what they should do.     Pt c/o BP issue: STAT if pt c/o blurred vision, one-sided weakness or slurred speech  1. What are your last 5 BP readings? 212/100 196/93   2. Are you having any other symptoms (ex. Dizziness, headache, blurred vision, passed out)?  No symptoms   3. What is your BP issue?  The on call told them to take the losartan tcz 100/25 and it brought it down to 169/81 and heart rate 70

## 2016-11-11 ENCOUNTER — Other Ambulatory Visit: Payer: Self-pay | Admitting: Internal Medicine

## 2016-11-11 NOTE — Telephone Encounter (Signed)
Routing to dr plotnikov, i'm not showing you have prescribed this med in past---are you ok with sending refill, please advise, thanks

## 2016-11-13 ENCOUNTER — Other Ambulatory Visit: Payer: Self-pay | Admitting: *Deleted

## 2016-11-13 MED ORDER — CITALOPRAM HYDROBROMIDE 10 MG PO TABS: 10.0000 mg | ORAL_TABLET | Freq: Every day | ORAL | 1 refills | Status: DC

## 2016-11-13 NOTE — Telephone Encounter (Signed)
Rec'd call from pt wife she was upset stating pharmacy had sent MD refill for his Citalopram and pharmacy told her that MD did not refilled a note was sent back stating refill not appropriate. Wife states he really need his medication he has been taking for years, and Dr. Alain Marion was the one who gave it to him. I have reviewed the patient's medical history in detail done a medication history. MD have been refilling mediations.Inform pt per chart the GI   Levin Erp, PA    Gave him a rx when he saw her back on 05/26/16 not sure why refill was not updated. Pt wife states MD gave him a rx back in June, and they had been refilling it but he is completely out. Wife states pt brother just passed,  and they  Had been dealing w/her brothers death, and he need to take. Inform wife will update medication list and send updated [facility]. Will forward to Dr. Alain Marion for FYI...Johny Chess

## 2016-11-19 ENCOUNTER — Ambulatory Visit (INDEPENDENT_AMBULATORY_CARE_PROVIDER_SITE_OTHER): Payer: Medicare Other | Admitting: Podiatry

## 2016-11-19 ENCOUNTER — Encounter: Payer: Self-pay | Admitting: Podiatry

## 2016-11-19 VITALS — BP 147/84 | HR 84

## 2016-11-19 DIAGNOSIS — B351 Tinea unguium: Secondary | ICD-10-CM

## 2016-11-19 DIAGNOSIS — L600 Ingrowing nail: Secondary | ICD-10-CM | POA: Diagnosis not present
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
## 2016-11-27 DIAGNOSIS — Z79899 Other long term (current) drug therapy: Secondary | ICD-10-CM | POA: Diagnosis not present

## 2016-11-28 LAB — BASIC METABOLIC PANEL
BUN/Creatinine Ratio: 21 (ref 10–24)
BUN: 27 mg/dL (ref 8–27)
CALCIUM: 9.5 mg/dL (ref 8.6–10.2)
CHLORIDE: 102 mmol/L (ref 96–106)
CO2: 21 mmol/L (ref 20–29)
Creatinine, Ser: 1.29 mg/dL — ABNORMAL HIGH (ref 0.76–1.27)
GFR calc Af Amer: 63 mL/min/{1.73_m2} (ref >59)
GFR calc non Af Amer: 54 mL/min/{1.73_m2} — ABNORMAL LOW (ref >59)
Glucose: 231 mg/dL — ABNORMAL HIGH (ref 65–99)
POTASSIUM: 4 mmol/L (ref 3.5–5.2)
Sodium: 139 mmol/L (ref 134–144)

## 2016-12-07 ENCOUNTER — Other Ambulatory Visit: Payer: Self-pay | Admitting: Internal Medicine

## 2017-01-28 ENCOUNTER — Ambulatory Visit (INDEPENDENT_AMBULATORY_CARE_PROVIDER_SITE_OTHER): Payer: Medicare Other | Admitting: Internal Medicine

## 2017-01-28 ENCOUNTER — Encounter: Payer: Self-pay | Admitting: Internal Medicine

## 2017-01-28 DIAGNOSIS — E1159 Type 2 diabetes mellitus with other circulatory complications: Secondary | ICD-10-CM | POA: Diagnosis not present

## 2017-01-28 DIAGNOSIS — I6350 Cerebral infarction due to unspecified occlusion or stenosis of unspecified cerebral artery: Secondary | ICD-10-CM | POA: Diagnosis not present

## 2017-01-28 DIAGNOSIS — I10 Essential (primary) hypertension: Secondary | ICD-10-CM

## 2017-01-28 DIAGNOSIS — I2510 Atherosclerotic heart disease of native coronary artery without angina pectoris: Secondary | ICD-10-CM | POA: Diagnosis not present

## 2017-01-28 NOTE — Assessment & Plan Note (Signed)
Losartan HCT 

## 2017-01-28 NOTE — Progress Notes (Signed)
Subjective:  [name]: Andrew Mechanic., male    DOB: 11-06-1940  Age: 76 y.o. MRN: [id number]  CC: No chief complaint on file.   HPI Andrew BROXTERMAN Sr. presents for CAD, dyslipidemia, DM f/u  Outpatient Medications Prior to Visit  Medication Sig Dispense Refill  . acyclovir (ZOVIRAX) 200 MG capsule Take 1 capsule (200 mg total) by mouth 2 (two) times daily. 60 capsule 3  . ammonium lactate (LAC-HYDRIN) 12 % lotion APPLY  TO THE AFFECTED AREA BID  6  . atorvastatin (LIPITOR) 20 MG tablet Take 1 tablet (20 mg total) by mouth daily. 90 tablet 3  . Cholecalciferol 1000 units tablet Take 1,000 Units by mouth daily.    . citalopram (CELEXA) 10 MG tablet Take 1 tablet (10 mg total) by mouth daily. 90 tablet 1  . clopidogrel (PLAVIX) 75 MG tablet Take 1 tablet (75 mg total) by mouth daily. 90 tablet 1  . Cyanocobalamin (VITAMIN B-12) 1000 MCG SUBL Place 1 tablet (1,000 mcg total) under the tongue daily. 100 tablet 3  . empagliflozin (JARDIANCE) 10 MG TABS tablet Take 10 mg by mouth daily. 30 tablet 11  . fluticasone (FLONASE) 50 MCG/ACT nasal spray Place 2 sprays into both nostrils daily. 16 g 3  . loratadine (CLARITIN) 10 MG tablet Take 1 tablet (10 mg total) by mouth daily. 100 tablet 3  . losartan-hydrochlorothiazide (HYZAAR) 100-12.5 MG tablet Take 1 tablet by mouth daily. 30 tablet 5  . metFORMIN (GLUCOPHAGE-XR) 500 MG 24 hr tablet Take 500 mg by mouth 2 (two) times daily.  3  . pantoprazole (PROTONIX) 40 MG tablet Take 1 tablet (40 mg total) by mouth daily. 90 tablet 1  . Tamsulosin HCl (FLOMAX) 0.4 MG CAPS Take 1 capsule by mouth at bedtime.     Facility-Administered Medications Prior to Visit  Medication Dose Route Frequency Provider Last Rate Last Dose  . 0.9 %  sodium chloride infusion  500 mL Intravenous Continuous Irene Shipper, MD        ROS Review of Systems  Constitutional: Negative for appetite change, fatigue and unexpected weight change.  HENT: Negative for  congestion, nosebleeds, sneezing, sore throat and trouble swallowing.   Eyes: Negative for itching and visual disturbance.  Respiratory: Negative for cough.   Cardiovascular: Negative for chest pain, palpitations and leg swelling.  Gastrointestinal: Negative for abdominal distention, blood in stool, diarrhea and nausea.  Genitourinary: Negative for frequency and hematuria.  Musculoskeletal: Positive for arthralgias. Negative for back pain, gait problem, joint swelling and neck pain.  Skin: Negative for rash.  Neurological: Negative for dizziness, tremors, speech difficulty and weakness.  Psychiatric/Behavioral: Negative for agitation, dysphoric mood and sleep disturbance. The patient is not nervous/anxious.   LBP  Objective:  BP 126/72 (BP Location: Right Arm, Patient Position: Sitting, Cuff Size: Large)   Pulse 77   Temp 98.1 F (36.7 C) (Oral)   Ht 5\' 10"  (1.778 m)   Wt 197 lb (89.4 kg)   SpO2 97%   BMI 28.27 kg/m   BP Readings from Last 3 Encounters:  01/28/17 126/72  11/19/16 (!) 147/84  10/26/16 134/72    Wt Readings from Last 3 Encounters:  01/28/17 197 lb (89.4 kg)  10/26/16 201 lb (91.2 kg)  10/20/16 200 lb (90.7 kg)    Physical Exam  Constitutional: He is oriented to person, place, and time. He appears well-developed. No distress.  NAD  HENT:  Mouth/Throat: Oropharynx is clear and moist.  Eyes: Conjunctivae  are normal. Pupils are equal, round, and reactive to light.  Neck: Normal range of motion. No JVD present. No thyromegaly present.  Cardiovascular: Normal rate, regular rhythm and intact distal pulses. Exam reveals no gallop and no friction rub.  Murmur heard. Pulmonary/Chest: Effort normal and breath sounds normal. No respiratory distress. He has no wheezes. He has no rales. He exhibits no tenderness.  Abdominal: Soft. Bowel sounds are normal. He exhibits no distension and no mass. There is no tenderness. There is no rebound and no guarding.    Musculoskeletal: Normal range of motion. He exhibits tenderness. He exhibits no edema.  Lymphadenopathy:    He has no cervical adenopathy.  Neurological: He is alert and oriented to person, place, and time. He has normal reflexes. No cranial nerve deficit. He exhibits normal muscle tone. He displays a negative Romberg sign. Coordination and gait normal.  Skin: Skin is warm and dry. No rash noted.  Psychiatric: He has a normal mood and affect. His behavior is normal. Judgment and thought content normal.  LS tender  Lab Results  Component Value Date   WBC 6.0 07/24/2016   HGB 12.2 (L) 07/24/2016   HCT 35.7 (L) 07/24/2016   PLT 219.0 07/24/2016   GLUCOSE 231 (H) 11/27/2016   CHOL 128 07/24/2016   TRIG 252.0 (H) 07/24/2016   HDL 26.10 (L) 07/24/2016   LDLDIRECT 51.0 07/24/2016   LDLCALC 68 12/27/2015   ALT 22 07/24/2016   AST 15 07/24/2016   NA 139 11/27/2016   K 4.0 11/27/2016   CL 102 11/27/2016   CREATININE 1.29 (H) 11/27/2016   BUN 27 11/27/2016   CO2 21 11/27/2016   TSH 2.08 07/24/2016   PSA 1.75 07/24/2016   INR 0.95 08/05/2011   HGBA1C 7.0 (H) 10/20/2016    Dg Chest 2 View  Result Date: 10/26/2016 CLINICAL DATA:  Dry cough. EXAM: CHEST  2 VIEW COMPARISON:  04/23/2014 . FINDINGS: Mediastinum and hilar structures normal. Heart size normal. Right base pleuroparenchymal scarring again noted. No pleural effusion or pneumothorax. 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 No interim change. No pneumothorax. no acute bony abnormality. IMPRESSION: No acute abnormality identified. Right base pleuroparenchymal scarring. Stable eventration right hemidiaphragm . Electronically Signed   By: Andrew Malone  Register   On: 10/26/2016 13:18    Assessment & Plan:   There are no diagnoses linked to this encounter. I am having Andrew Malone. Andrew Noa Sr. maintain his tamsulosin, Vitamin B-12, loratadine, ammonium lactate, metFORMIN, Cholecalciferol, fluticasone,  empagliflozin, atorvastatin, clopidogrel, acyclovir, losartan-hydrochlorothiazide, citalopram, and pantoprazole. We will continue to administer sodium chloride.  No orders of the defined types were placed in this encounter.    Follow-up: No Follow-up on file.  Walker Kehr, MD

## 2017-01-28 NOTE — Assessment & Plan Note (Signed)
F/u w/Dr  Stanford Breed Plavix, Losartan HCT, Lipitor

## 2017-01-28 NOTE — Assessment & Plan Note (Signed)
Plavix, Losartan HCT, Lipitor 

## 2017-01-28 NOTE — Assessment & Plan Note (Signed)
Metformin  Jardiance  

## 2017-02-04 DIAGNOSIS — N401 Enlarged prostate with lower urinary tract symptoms: Secondary | ICD-10-CM | POA: Diagnosis not present

## 2017-02-04 DIAGNOSIS — R972 Elevated prostate specific antigen [PSA]: Secondary | ICD-10-CM | POA: Diagnosis not present

## 2017-02-04 DIAGNOSIS — R3911 Hesitancy of micturition: Secondary | ICD-10-CM | POA: Diagnosis not present

## 2017-02-25 ENCOUNTER — Ambulatory Visit: Payer: Self-pay

## 2017-02-25 NOTE — Telephone Encounter (Signed)
I have set him up an appointment for tomorrow 12/27 at 2pm.

## 2017-02-25 NOTE — Telephone Encounter (Signed)
Please schedule tomorrow afternoon

## 2017-02-25 NOTE — Telephone Encounter (Signed)
Please advise about scheduling pt, you already have 12 today, tomorrow and 11 monday

## 2017-02-25 NOTE — Telephone Encounter (Signed)
  Reason for Disposition . [1] Painless rash (e.g., redness, tiny bumps, sore) AND [2] present > 24 hours  Answer Assessment - Initial Assessment Questions 1. SYMPTOM: "What's the main symptom you're concerned about?" (e.g., discharge from penis, rash, pain, itching, swelling)     Penis reddened 2. LOCATION: "Where is the _______ located?"     Whole area 3. ONSET: "When did ________  start?"     Started 02/22/17 4. PAIN: "Is there any pain?" If so, ask: "How bad is it?"  (Scale 1-10; or mild, moderate, severe)     No 5. URINE: "Any difficulty passing urine?" If so, ask: "When was the last time?"     No, but frequency 6. CAUSE: "What do you think is causing the symptoms?"     Jardience 7. OTHER SYMPTOMS: "Do you have any other symptoms?" (e.g., fever, abdominal pain, blood in urine)     Blood in underwear at times  Protocols used: [place] SYMPTOMS-A-AH Pt. Request to see Dr. Alain Marion only. Feels this is related to the [place].

## 2017-02-26 ENCOUNTER — Encounter: Payer: Self-pay | Admitting: Internal Medicine

## 2017-02-26 ENCOUNTER — Ambulatory Visit (INDEPENDENT_AMBULATORY_CARE_PROVIDER_SITE_OTHER): Payer: Medicare Other | Admitting: Internal Medicine

## 2017-02-26 DIAGNOSIS — I10 Essential (primary) hypertension: Secondary | ICD-10-CM

## 2017-02-26 DIAGNOSIS — E785 Hyperlipidemia, unspecified: Secondary | ICD-10-CM

## 2017-02-26 DIAGNOSIS — I6350 Cerebral infarction due to unspecified occlusion or stenosis of unspecified cerebral artery: Secondary | ICD-10-CM | POA: Diagnosis not present

## 2017-02-26 DIAGNOSIS — E538 Deficiency of other specified B group vitamins: Secondary | ICD-10-CM

## 2017-02-26 DIAGNOSIS — N481 Balanitis: Secondary | ICD-10-CM

## 2017-02-26 MED ORDER — CLOTRIMAZOLE-BETAMETHASONE 1-0.05 % EX CREA: 1.0000 "application " | TOPICAL_CREAM | Freq: Two times a day (BID) | CUTANEOUS | 2 refills | Status: AC

## 2017-02-26 NOTE — Progress Notes (Signed)
Subjective:  [name]: Andrew Malone., male    DOB: 08/19/1940  Age: 76 y.o. MRN: [id number]  CC: No chief complaint on file.   HPI TAVI HOOGENDOORN Sr. presents for a rash on penis x 3 months off and on F/u DM, HTN  Outpatient Medications Prior to Visit  Medication Sig Dispense Refill  . acyclovir (ZOVIRAX) 200 MG capsule Take 1 capsule (200 mg total) by mouth 2 (two) times daily. 60 capsule 3  . ammonium lactate (LAC-HYDRIN) 12 % lotion APPLY  TO THE AFFECTED AREA BID  6  . atorvastatin (LIPITOR) 20 MG tablet Take 1 tablet (20 mg total) by mouth daily. 90 tablet 3  . Cholecalciferol 1000 units tablet Take 1,000 Units by mouth daily.    . citalopram (CELEXA) 10 MG tablet Take 1 tablet (10 mg total) by mouth daily. 90 tablet 1  . clopidogrel (PLAVIX) 75 MG tablet Take 1 tablet (75 mg total) by mouth daily. 90 tablet 1  . Cyanocobalamin (VITAMIN B-12) 1000 MCG SUBL Place 1 tablet (1,000 mcg total) under the tongue daily. 100 tablet 3  . empagliflozin (JARDIANCE) 10 MG TABS tablet Take 10 mg by mouth daily. 30 tablet 11  . fluticasone (FLONASE) 50 MCG/ACT nasal spray Place 2 sprays into both nostrils daily. 16 g 3  . loratadine (CLARITIN) 10 MG tablet Take 1 tablet (10 mg total) by mouth daily. 100 tablet 3  . losartan-hydrochlorothiazide (HYZAAR) 100-12.5 MG tablet Take 1 tablet by mouth daily. 30 tablet 5  . metFORMIN (GLUCOPHAGE-XR) 500 MG 24 hr tablet Take 500 mg by mouth 2 (two) times daily.  3  . pantoprazole (PROTONIX) 40 MG tablet Take 1 tablet (40 mg total) by mouth daily. 90 tablet 1  . Tamsulosin HCl (FLOMAX) 0.4 MG CAPS Take 1 capsule by mouth at bedtime.     Facility-Administered Medications Prior to Visit  Medication Dose Route Frequency Provider Last Rate Last Dose  . 0.9 %  sodium chloride infusion  500 mL Intravenous Continuous Irene Shipper, MD        ROS Review of Systems  Constitutional: Negative for appetite change, fatigue and unexpected weight change.    HENT: Negative for congestion, nosebleeds, sneezing, sore throat and trouble swallowing.   Eyes: Negative for itching and visual disturbance.  Respiratory: Negative for cough.   Cardiovascular: Negative for chest pain, palpitations and leg swelling.  Gastrointestinal: Negative for abdominal distention, blood in stool, diarrhea and nausea.  Genitourinary: Negative for frequency and hematuria.  Musculoskeletal: Negative for back pain, gait problem, joint swelling and neck pain.  Skin: Positive for rash.  Neurological: Negative for dizziness, tremors, speech difficulty and weakness.  Psychiatric/Behavioral: Negative for agitation, dysphoric mood and sleep disturbance. The patient is not nervous/anxious.     Objective:  BP (!) 144/76 (BP Location: Left Arm, Patient Position: Sitting, Cuff Size: Large)   Pulse 83   Temp 97.8 F (36.6 C) (Oral)   Ht 5\' 10"  (1.778 m)   Wt 199 lb (90.3 kg)   SpO2 98%   BMI 28.55 kg/m   BP Readings from Last 3 Encounters:  02/26/17 (!) 144/76  01/28/17 126/72  11/19/16 (!) 147/84    Wt Readings from Last 3 Encounters:  02/26/17 199 lb (90.3 kg)  01/28/17 197 lb (89.4 kg)  10/26/16 201 lb (91.2 kg)    Physical Exam  Constitutional: He is oriented to person, place, and time. He appears well-developed. No distress.  NAD  HENT:  Mouth/Throat: Oropharynx is clear and moist.  Eyes: Conjunctivae are normal. Pupils are equal, round, and reactive to light.  Neck: Normal range of motion. No JVD present. No thyromegaly present.  Cardiovascular: Normal rate, regular rhythm, normal heart sounds and intact distal pulses. Exam reveals no gallop and no friction rub.  No murmur heard. Pulmonary/Chest: Effort normal and breath sounds normal. No respiratory distress. He has no wheezes. He has no rales. He exhibits no tenderness.  Abdominal: Soft. Bowel sounds are normal. He exhibits no distension and no mass. There is no tenderness. There is no rebound and no  guarding.  Musculoskeletal: Normal range of motion. He exhibits no edema or tenderness.  Lymphadenopathy:    He has no cervical adenopathy.  Neurological: He is alert and oriented to person, place, and time. He has normal reflexes. No cranial nerve deficit. He exhibits normal muscle tone. He displays a negative Romberg sign. Coordination and gait normal.  Skin: Skin is warm and dry. Rash noted.  Psychiatric: He has a normal mood and affect. His behavior is normal. Judgment and thought content normal.  foreskin w/rash  Lab Results  Component Value Date   WBC 6.0 07/24/2016   HGB 12.2 (L) 07/24/2016   HCT 35.7 (L) 07/24/2016   PLT 219.0 07/24/2016   GLUCOSE 231 (H) 11/27/2016   CHOL 128 07/24/2016   TRIG 252.0 (H) 07/24/2016   HDL 26.10 (L) 07/24/2016   LDLDIRECT 51.0 07/24/2016   LDLCALC 68 12/27/2015   ALT 22 07/24/2016   AST 15 07/24/2016   NA 139 11/27/2016   K 4.0 11/27/2016   CL 102 11/27/2016   CREATININE 1.29 (H) 11/27/2016   BUN 27 11/27/2016   CO2 21 11/27/2016   TSH 2.08 07/24/2016   PSA 1.75 07/24/2016   INR 0.95 08/05/2011   HGBA1C 7.0 (H) 10/20/2016    Dg Chest 2 View  Result Date: 10/26/2016 CLINICAL DATA:  Dry cough. EXAM: CHEST  2 VIEW COMPARISON:  04/23/2014 . FINDINGS: Mediastinum and hilar structures normal. Heart size normal. Right base pleuroparenchymal scarring again noted. No pleural effusion or pneumothorax. 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 No interim change. No pneumothorax. no acute bony abnormality. IMPRESSION: No acute abnormality identified. Right base pleuroparenchymal scarring. Stable eventration right hemidiaphragm . Electronically Signed   By: Marcello Moores  Register   On: 10/26/2016 13:18    Assessment & Plan:   There are no diagnoses linked to this encounter. I am having [name]. Andrew Noa Sr. maintain his tamsulosin, Vitamin B-12, loratadine, ammonium lactate, metFORMIN, Cholecalciferol,  fluticasone, empagliflozin, atorvastatin, clopidogrel, acyclovir, losartan-hydrochlorothiazide, citalopram, and pantoprazole. We will continue to administer sodium chloride.  No orders of the defined types were placed in this encounter.    Follow-up: No Follow-up on file.  Walker Kehr, MD

## 2017-02-26 NOTE — Assessment & Plan Note (Signed)
Lipitor 

## 2017-02-26 NOTE — Assessment & Plan Note (Signed)
On Jardiance 

## 2017-02-26 NOTE — Assessment & Plan Note (Signed)
  Jardiance po to cont Lotrisone prn

## 2017-02-26 NOTE — Assessment & Plan Note (Signed)
Losartan HCT 

## 2017-02-26 NOTE — Assessment & Plan Note (Signed)
On B12 

## 2017-03-10 ENCOUNTER — Telehealth: Payer: Self-pay | Admitting: Cardiology

## 2017-03-10 NOTE — Telephone Encounter (Signed)
New Message     Patient wife call to schedule February recall and Dr Stanford Breed first opening is April 9th. Wife refused appt, does not want to see APP, wants Dr Stanford Breed to call her and put him on the schedule for February , this is unacceptable scheduling.

## 2017-03-10 NOTE — Telephone Encounter (Signed)
Spoke with patients wife and she stated Dr Stanford Breed tells them he can see patient for follow up. Patient does not feel comfortable seeing PA/NP and will not open up to them the way he does Dr Stanford Breed. He was to follow up in 6 months. When received letter she called and nothing available for 2 months after he was supposed to be seen. This has been an ongoing problem and she is getting frustrated. Per wife patient has had decreased stamina over the last couple of months and feels he needs to be seen sooner than April. Will forward to Luisa Dago RN to discuss with Dr Stanford Breed when he is back in the office next week. Wife aware she will not receive call back until next week.  Ok to leave detailed message on home number or cell number.

## 2017-03-15 NOTE — Telephone Encounter (Signed)
Unable to reach pt or leave a message  

## 2017-03-23 NOTE — Telephone Encounter (Signed)
Spoke with pt wife, Follow up scheduled  

## 2017-03-24 DIAGNOSIS — Z85828 Personal history of other malignant neoplasm of skin: Secondary | ICD-10-CM | POA: Diagnosis not present

## 2017-03-24 DIAGNOSIS — L814 Other melanin hyperpigmentation: Secondary | ICD-10-CM | POA: Diagnosis not present

## 2017-03-24 DIAGNOSIS — D485 Neoplasm of uncertain behavior of skin: Secondary | ICD-10-CM | POA: Diagnosis not present

## 2017-03-24 DIAGNOSIS — D1801 Hemangioma of skin and subcutaneous tissue: Secondary | ICD-10-CM | POA: Diagnosis not present

## 2017-03-24 DIAGNOSIS — D225 Melanocytic nevi of trunk: Secondary | ICD-10-CM | POA: Diagnosis not present

## 2017-03-24 DIAGNOSIS — L853 Xerosis cutis: Secondary | ICD-10-CM | POA: Diagnosis not present

## 2017-03-24 DIAGNOSIS — L570 Actinic keratosis: Secondary | ICD-10-CM | POA: Diagnosis not present

## 2017-03-24 DIAGNOSIS — D2262 Melanocytic nevi of left upper limb, including shoulder: Secondary | ICD-10-CM | POA: Diagnosis not present

## 2017-03-24 DIAGNOSIS — L821 Other seborrheic keratosis: Secondary | ICD-10-CM | POA: Diagnosis not present

## 2017-03-24 DIAGNOSIS — D2272 Melanocytic nevi of left lower limb, including hip: Secondary | ICD-10-CM | POA: Diagnosis not present
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
## 2017-04-02 DIAGNOSIS — I10 Essential (primary) hypertension: Secondary | ICD-10-CM | POA: Diagnosis not present

## 2017-04-02 DIAGNOSIS — R5383 Other fatigue: Secondary | ICD-10-CM | POA: Diagnosis not present

## 2017-04-02 DIAGNOSIS — E1142 Type 2 diabetes mellitus with diabetic polyneuropathy: Secondary | ICD-10-CM | POA: Diagnosis not present

## 2017-04-02 DIAGNOSIS — Z79899 Other long term (current) drug therapy: Secondary | ICD-10-CM | POA: Diagnosis not present

## 2017-04-09 ENCOUNTER — Ambulatory Visit (INDEPENDENT_AMBULATORY_CARE_PROVIDER_SITE_OTHER): Payer: Medicare Other | Admitting: Cardiology

## 2017-04-09 ENCOUNTER — Encounter: Payer: Self-pay | Admitting: Cardiology

## 2017-04-09 ENCOUNTER — Other Ambulatory Visit: Payer: Self-pay | Admitting: *Deleted

## 2017-04-09 VITALS — BP 136/70 | HR 71 | Ht 70.5 in | Wt 200.0 lb

## 2017-04-09 DIAGNOSIS — I350 Nonrheumatic aortic (valve) stenosis: Secondary | ICD-10-CM | POA: Diagnosis not present

## 2017-04-09 DIAGNOSIS — R002 Palpitations: Secondary | ICD-10-CM

## 2017-04-09 DIAGNOSIS — I2510 Atherosclerotic heart disease of native coronary artery without angina pectoris: Secondary | ICD-10-CM

## 2017-04-09 MED ORDER — CLOPIDOGREL BISULFATE 75 MG PO TABS: 75.0000 mg | ORAL_TABLET | Freq: Every day | ORAL | 1 refills | Status: DC

## 2017-04-09 NOTE — Patient Instructions (Signed)
Medication Instructions:   NO CHANGE  Labwork:  Your physician recommends that you return for lab work WHEN FASTING  Testing/Procedures:  Your physician has requested that you have an echocardiogram. Echocardiography is a painless test that uses sound waves to create images of your heart. It provides your doctor with information about the size and shape of your heart and how well your heart's chambers and valves are working. This procedure takes approximately one hour. There are no restrictions for this procedure.    Follow-Up:  Your physician wants you to follow-up in: [place] will receive a reminder letter in the mail two months in advance. If you don't receive a letter, please call our office to schedule the follow-up appointment.   If you need a refill on your cardiac medications before your next appointment, please call your pharmacy.

## 2017-04-13 NOTE — Addendum Note (Signed)
Addended by: Venetia Maxon on: 04/13/2017 04:42 PM   Modules accepted: Orders

## 2017-04-15 ENCOUNTER — Ambulatory Visit (HOSPITAL_COMMUNITY): Payer: Medicare Other | Attending: Internal Medicine

## 2017-04-15 ENCOUNTER — Other Ambulatory Visit: Payer: Self-pay

## 2017-04-15 DIAGNOSIS — I080 Rheumatic disorders of both mitral and aortic valves: Secondary | ICD-10-CM | POA: Diagnosis not present

## 2017-04-15 DIAGNOSIS — I350 Nonrheumatic aortic (valve) stenosis: Secondary | ICD-10-CM | POA: Diagnosis not present

## 2017-04-15 DIAGNOSIS — Z8673 Personal history of transient ischemic attack (TIA), and cerebral infarction without residual deficits: Secondary | ICD-10-CM | POA: Insufficient documentation

## 2017-04-15 DIAGNOSIS — G4733 Obstructive sleep apnea (adult) (pediatric): Secondary | ICD-10-CM | POA: Insufficient documentation

## 2017-04-15 DIAGNOSIS — E785 Hyperlipidemia, unspecified: Secondary | ICD-10-CM | POA: Insufficient documentation

## 2017-04-15 DIAGNOSIS — I119 Hypertensive heart disease without heart failure: Secondary | ICD-10-CM | POA: Insufficient documentation

## 2017-04-15 DIAGNOSIS — I2510 Atherosclerotic heart disease of native coronary artery without angina pectoris: Secondary | ICD-10-CM | POA: Insufficient documentation

## 2017-04-15 DIAGNOSIS — R002 Palpitations: Secondary | ICD-10-CM | POA: Diagnosis not present

## 2017-04-15 LAB — LIPID PANEL
CHOLESTEROL TOTAL: 139 mg/dL (ref 100–199)
Chol/HDL Ratio: 4.2 ratio (ref 0.0–5.0)
HDL: 33 mg/dL — ABNORMAL LOW (ref >39)
LDL CALC: 75 mg/dL (ref 0–99)
Triglycerides: 157 mg/dL — ABNORMAL HIGH (ref 0–149)
VLDL CHOLESTEROL CAL: 31 mg/dL (ref 5–40)

## 2017-04-15 LAB — TSH: TSH: 1.88 u[IU]/mL (ref 0.450–4.500)

## 2017-04-29 ENCOUNTER — Ambulatory Visit: Payer: Medicare Other | Admitting: Internal Medicine

## 2017-05-04 DIAGNOSIS — E119 Type 2 diabetes mellitus without complications: Secondary | ICD-10-CM | POA: Diagnosis not present

## 2017-05-04 DIAGNOSIS — H52203 Unspecified astigmatism, bilateral: Secondary | ICD-10-CM | POA: Diagnosis not present

## 2017-05-04 DIAGNOSIS — H524 Presbyopia: Secondary | ICD-10-CM | POA: Diagnosis not present

## 2017-05-04 DIAGNOSIS — Z961 Presence of intraocular lens: Secondary | ICD-10-CM | POA: Diagnosis not present

## 2017-05-04 DIAGNOSIS — Z7984 Long term (current) use of oral hypoglycemic drugs: Secondary | ICD-10-CM | POA: Diagnosis not present

## 2017-05-05 ENCOUNTER — Ambulatory Visit (INDEPENDENT_AMBULATORY_CARE_PROVIDER_SITE_OTHER): Payer: Medicare Other | Admitting: Internal Medicine

## 2017-05-05 ENCOUNTER — Encounter: Payer: Self-pay | Admitting: Internal Medicine

## 2017-05-05 DIAGNOSIS — F419 Anxiety disorder, unspecified: Secondary | ICD-10-CM

## 2017-05-05 DIAGNOSIS — M5440 Lumbago with sciatica, unspecified side: Secondary | ICD-10-CM

## 2017-05-05 DIAGNOSIS — I10 Essential (primary) hypertension: Secondary | ICD-10-CM

## 2017-05-05 DIAGNOSIS — I2510 Atherosclerotic heart disease of native coronary artery without angina pectoris: Secondary | ICD-10-CM | POA: Diagnosis not present

## 2017-05-05 DIAGNOSIS — E538 Deficiency of other specified B group vitamins: Secondary | ICD-10-CM | POA: Diagnosis not present

## 2017-05-05 DIAGNOSIS — G8929 Other chronic pain: Secondary | ICD-10-CM | POA: Insufficient documentation

## 2017-05-05 DIAGNOSIS — M25551 Pain in right hip: Secondary | ICD-10-CM | POA: Diagnosis not present

## 2017-05-05 DIAGNOSIS — E785 Hyperlipidemia, unspecified: Secondary | ICD-10-CM

## 2017-05-05 DIAGNOSIS — E1159 Type 2 diabetes mellitus with other circulatory complications: Secondary | ICD-10-CM | POA: Diagnosis not present

## 2017-05-05 MED ORDER — METHYLPREDNISOLONE ACETATE 80 MG/ML IJ SUSP
80.0000 mg | Freq: Once | INTRAMUSCULAR | Status: AC
  Administered 2017-05-05: 80 mg via INTRA_ARTICULAR

## 2017-05-05 MED ORDER — CITALOPRAM HYDROBROMIDE 10 MG PO TABS: 10.0000 mg | ORAL_TABLET | Freq: Every day | ORAL | 3 refills | Status: DC

## 2017-05-05 NOTE — Assessment & Plan Note (Signed)
Klonopin 

## 2017-05-05 NOTE — Assessment & Plan Note (Signed)
Jardiance - d/c'd

## 2017-05-05 NOTE — Patient Instructions (Signed)
Postprocedure instructions :    A Band-Aid should be left on for 12 hours. Injection therapy is not a cure itself. It is used in conjunction with other modalities. You can use nonsteroidal anti-inflammatories like ibuprofen , hot and cold compresses. Rest is recommended in the next 24 hours. You need to report immediately  if fever, chills or any signs of infection develop. 

## 2017-05-05 NOTE — Assessment & Plan Note (Signed)
Losartan HCT 

## 2017-05-05 NOTE — Assessment & Plan Note (Signed)
Lipitor 

## 2017-05-05 NOTE — Assessment & Plan Note (Signed)
On B12 

## 2017-05-05 NOTE — Assessment & Plan Note (Signed)
troch bursitis - will inject

## 2017-05-05 NOTE — Addendum Note (Signed)
Addended by: Karren Cobble on: 05/05/2017 01:12 PM   Modules accepted: Orders

## 2017-05-05 NOTE — Assessment & Plan Note (Signed)
Norco prn  Potential benefits of a long term opioids use as well as potential risks (i.e. addiction risk, apnea etc) and complications (i.e. Somnolence, constipation and others) were explained to the patient and were aknowledged. 

## 2017-05-05 NOTE — Progress Notes (Signed)
Subjective:  [name]: [name], male    DOB: [birth year]  Age: 77 y.o. MRN: [id number]  CC: No chief complaint on file.   HPI VIHAAN GLOSS Sr. presents for CAD, dyslipidemia, depression, B12 def f/u C/o severe R hip pain x 1 mo  Outpatient Medications Prior to Visit  Medication Sig Dispense Refill  . acyclovir (ZOVIRAX) 200 MG capsule Take 1 capsule (200 mg total) by mouth 2 (two) times daily. 60 capsule 3  . ammonium lactate (LAC-HYDRIN) 12 % lotion APPLY  TO THE AFFECTED AREA BID  6  . atorvastatin (LIPITOR) 20 MG tablet Take 1 tablet (20 mg total) by mouth daily. 90 tablet 3  . Cholecalciferol 1000 units tablet Take 1,000 Units by mouth daily.    . citalopram (CELEXA) 10 MG tablet Take 1 tablet (10 mg total) by mouth daily. 90 tablet 1  . clopidogrel (PLAVIX) 75 MG tablet Take 1 tablet (75 mg total) by mouth daily. 90 tablet 1  . clotrimazole-betamethasone (LOTRISONE) cream Apply 1 application topically 2 (two) times daily. 45 g 2  . Cyanocobalamin (VITAMIN B-12) 1000 MCG SUBL Place 1 tablet (1,000 mcg total) under the tongue daily. 100 tablet 3  . fluticasone (FLONASE) 50 MCG/ACT nasal spray Place 2 sprays into both nostrils daily. 16 g 3  . loratadine (CLARITIN) 10 MG tablet Take 1 tablet (10 mg total) by mouth daily. 100 tablet 3  . losartan-hydrochlorothiazide (HYZAAR) 100-12.5 MG tablet Take 1 tablet by mouth daily. 30 tablet 5  . metFORMIN (GLUCOPHAGE-XR) 500 MG 24 hr tablet Take 500 mg by mouth 2 (two) times daily.  3  . pantoprazole (PROTONIX) 40 MG tablet Take 1 tablet (40 mg total) by mouth daily. 90 tablet 1  . Tamsulosin HCl (FLOMAX) 0.4 MG CAPS Take 1 capsule by mouth at bedtime.     Facility-Administered Medications Prior to Visit  Medication Dose Route Frequency Provider Last Rate Last Dose  . 0.9 %  sodium chloride infusion  500 mL Intravenous Continuous Irene Shipper, MD        ROS Review of Systems  Constitutional: Negative for appetite change,  fatigue and unexpected weight change.  HENT: Negative for congestion, nosebleeds, sneezing, sore throat and trouble swallowing.   Eyes: Negative for itching and visual disturbance.  Respiratory: Negative for cough.   Cardiovascular: Negative for chest pain, palpitations and leg swelling.  Gastrointestinal: Negative for abdominal distention, blood in stool, diarrhea and nausea.  Genitourinary: Negative for frequency and hematuria.  Musculoskeletal: Positive for back pain. Negative for gait problem, joint swelling and neck pain.  Skin: Negative for rash.  Neurological: Negative for dizziness, tremors, speech difficulty and weakness.  Psychiatric/Behavioral: Negative for agitation, dysphoric mood and sleep disturbance. The patient is not nervous/anxious.     Objective:  BP 136/74 (BP Location: Left Arm, Patient Position: Sitting, Cuff Size: Large)   Pulse 72   Temp 98.2 F (36.8 C) (Oral)   Ht 5' 10.5" (1.791 m)   Wt 202 lb (91.6 kg)   SpO2 98%   BMI 28.57 kg/m   BP Readings from Last 3 Encounters:  05/05/17 136/74  04/09/17 136/70  02/26/17 (!) 144/76    Wt Readings from Last 3 Encounters:  05/05/17 202 lb (91.6 kg)  04/09/17 200 lb (90.7 kg)  02/26/17 199 lb (90.3 kg)    Physical Exam  Constitutional: He is oriented to person, place, and time. He appears well-developed. No distress.  NAD  HENT:  Mouth/Throat:  Oropharynx is clear and moist.  Eyes: Conjunctivae are normal. Pupils are equal, round, and reactive to light.  Neck: Normal range of motion. No JVD present. No thyromegaly present.  Cardiovascular: Normal rate, regular rhythm, normal heart sounds and intact distal pulses. Exam reveals no gallop and no friction rub.  No murmur heard. Pulmonary/Chest: Effort normal and breath sounds normal. No respiratory distress. He has no wheezes. He has no rales. He exhibits no tenderness.  Abdominal: Soft. Bowel sounds are normal. He exhibits no distension and no mass. There is  no tenderness. There is no rebound and no guarding.  Musculoskeletal: Normal range of motion. He exhibits tenderness. He exhibits no edema.  Lymphadenopathy:    He has no cervical adenopathy.  Neurological: He is alert and oriented to person, place, and time. He has normal reflexes. No cranial nerve deficit. He exhibits normal muscle tone. He displays a negative Romberg sign. Coordination and gait normal.  Skin: Skin is warm and dry. No rash noted.  Psychiatric: He has a normal mood and affect. His behavior is normal. Judgment and thought content normal.  LS tender R troch bursa - tender   Procedure Note :     Procedure : Joint Injection,  R  hip   Indication:  Trochanteric bursitis with refractory  chronic pain.   Risks including unsuccessful procedure , bleeding, infection, bruising, skin atrophy, "steroid flare-up" and others were explained to the patient in detail as well as the benefits. Informed consent was obtained and signed.   Tthe patient was placed in a comfortable lateral decubitus position. The point of maximal tenderness was identified. Skin was prepped with Betadine and alcohol. Then, a 5 cc syringe with a 2 inch long 24-gauge needle was used for a bursa injection.. The needle was advanced  Into the bursa. I injected the bursa with 4 mL of 2% lidocaine and 80 mg of Depo-Medrol .  Band-Aid was applied.   Tolerated well. Complications: None. Good pain relief following the procedure.   Postprocedure instructions :    A Band-Aid should be left on for 12 hours. Injection therapy is not a cure itself. It is used in conjunction with other modalities. You can use nonsteroidal anti-inflammatories like ibuprofen , hot and cold compresses. Rest is recommended in the next 24 hours. You need to report immediately  if fever, chills or any signs of infection develop.    Lab Results  Component Value Date   WBC 6.0 07/24/2016   HGB 12.2 (L) 07/24/2016   HCT 35.7 (L) 07/24/2016   PLT  219.0 07/24/2016   GLUCOSE 231 (H) 11/27/2016   CHOL 139 04/15/2017   TRIG 157 (H) 04/15/2017   HDL 33 (L) 04/15/2017   LDLDIRECT 51.0 07/24/2016   LDLCALC 75 04/15/2017   ALT 22 07/24/2016   AST 15 07/24/2016   NA 139 11/27/2016   K 4.0 11/27/2016   CL 102 11/27/2016   CREATININE 1.29 (H) 11/27/2016   BUN 27 11/27/2016   CO2 21 11/27/2016   TSH 1.880 04/15/2017   PSA 1.75 07/24/2016   INR 0.95 08/05/2011   HGBA1C 7.0 (H) 10/20/2016    Dg Chest 2 View  Result Date: 10/26/2016 CLINICAL DATA:  Dry cough. EXAM: CHEST  2 VIEW COMPARISON:  04/23/2014 . FINDINGS: Mediastinum and hilar structures normal. Heart size normal. Right base pleuroparenchymal scarring again noted. No pleural effusion or pneumothorax. 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 No interim change. No pneumothorax.  no acute bony abnormality. IMPRESSION: No acute abnormality identified. Right base pleuroparenchymal scarring. Stable eventration right hemidiaphragm . Electronically Signed   By: Marcello Moores  Register   On: 10/26/2016 13:18    Assessment & Plan:   There are no diagnoses linked to this encounter. I am having Lois Ostrom. [name] maintain his tamsulosin, Vitamin B-12, loratadine, ammonium lactate, metFORMIN, Cholecalciferol, fluticasone, atorvastatin, acyclovir, losartan-hydrochlorothiazide, citalopram, pantoprazole, clotrimazole-betamethasone, and clopidogrel. We will continue to administer sodium chloride.  No orders of the defined types were placed in this encounter.    Follow-up: No Follow-up on file.  Walker Kehr, MD

## 2017-05-09 ENCOUNTER — Other Ambulatory Visit: Payer: Self-pay | Admitting: Cardiology

## 2017-05-10 NOTE — Telephone Encounter (Signed)
REFILL 

## 2017-06-14 ENCOUNTER — Other Ambulatory Visit: Payer: Self-pay | Admitting: Internal Medicine

## 2017-06-14 MED ORDER — PANTOPRAZOLE SODIUM 40 MG PO TBEC: 40.0000 mg | DELAYED_RELEASE_TABLET | Freq: Every day | ORAL | 1 refills | Status: DC

## 2017-06-14 NOTE — Telephone Encounter (Signed)
Copied from [place] [phone]. Topic: Quick Communication - Rx Refill/Question >> Jun 14, 2017  9:50 AM Lennox Solders wrote: Medication: generic protonix 40 mg #90 w/refills  Has the patient contacted their pharmacy yes [facility] [place] [phone] Agent: pt has enough med for 3 days

## 2017-06-28 ENCOUNTER — Ambulatory Visit (INDEPENDENT_AMBULATORY_CARE_PROVIDER_SITE_OTHER)
Admission: RE | Admit: 2017-06-28 | Discharge: 2017-06-28 | Disposition: A | Discharge status: Home / Self Care | Payer: Medicare Other | Source: Ambulatory Visit | Attending: Internal Medicine | Admitting: Internal Medicine

## 2017-06-28 ENCOUNTER — Encounter: Payer: Self-pay | Admitting: Internal Medicine

## 2017-06-28 ENCOUNTER — Ambulatory Visit (INDEPENDENT_AMBULATORY_CARE_PROVIDER_SITE_OTHER): Payer: Medicare Other | Admitting: Internal Medicine

## 2017-06-28 DIAGNOSIS — M25551 Pain in right hip: Secondary | ICD-10-CM

## 2017-06-28 DIAGNOSIS — I2510 Atherosclerotic heart disease of native coronary artery without angina pectoris: Secondary | ICD-10-CM | POA: Diagnosis not present

## 2017-06-28 DIAGNOSIS — G8929 Other chronic pain: Secondary | ICD-10-CM

## 2017-06-28 DIAGNOSIS — M1611 Unilateral primary osteoarthritis, right hip: Secondary | ICD-10-CM | POA: Diagnosis not present

## 2017-06-28 MED ORDER — ACYCLOVIR 200 MG PO CAPS: 200.0000 mg | ORAL_CAPSULE | Freq: Two times a day (BID) | ORAL | 3 refills | Status: DC

## 2017-06-28 NOTE — Assessment & Plan Note (Signed)
troch bursitis vs other - not better Ortho ref Dr Hoy Finlay ray R hip

## 2017-06-28 NOTE — Progress Notes (Signed)
Subjective:  [name]: Andrew Mechanic., male    DOB: 11-13-1940  Age: 77 y.o. MRN: [id number]  CC: No chief complaint on file.   HPI Andrew STEELMAN Sr. presents for R hip pain x 2 mo - not better w/hip bursa injection. No LBP.  Outpatient Medications Prior to Visit  Medication Sig Dispense Refill  . ammonium lactate (LAC-HYDRIN) 12 % lotion APPLY  TO THE AFFECTED AREA BID  6  . atorvastatin (LIPITOR) 20 MG tablet Take 1 tablet (20 mg total) by mouth daily. 90 tablet 3  . Cholecalciferol 1000 units tablet Take 1,000 Units by mouth daily.    . citalopram (CELEXA) 10 MG tablet Take 1 tablet (10 mg total) by mouth daily. 90 tablet 3  . clopidogrel (PLAVIX) 75 MG tablet Take 1 tablet (75 mg total) by mouth daily. 90 tablet 1  . clotrimazole-betamethasone (LOTRISONE) cream Apply 1 application topically 2 (two) times daily. 45 g 2  . Cyanocobalamin (VITAMIN B-12) 1000 MCG SUBL Place 1 tablet (1,000 mcg total) under the tongue daily. 100 tablet 3  . fluticasone (FLONASE) 50 MCG/ACT nasal spray Place 2 sprays into both nostrils daily. 16 g 3  . loratadine (CLARITIN) 10 MG tablet Take 1 tablet (10 mg total) by mouth daily. 100 tablet 3  . losartan-hydrochlorothiazide (HYZAAR) 100-12.5 MG tablet TAKE 1 TABLET BY MOUTH DAILY 30 tablet 1  . metFORMIN (GLUCOPHAGE-XR) 500 MG 24 hr tablet Take 500 mg by mouth 2 (two) times daily.  3  . pantoprazole (PROTONIX) 40 MG tablet Take 1 tablet (40 mg total) by mouth daily. 90 tablet 1  . Tamsulosin HCl (FLOMAX) 0.4 MG CAPS Take 1 capsule by mouth at bedtime.    Marland Kitchen acyclovir (ZOVIRAX) 200 MG capsule Take 1 capsule (200 mg total) by mouth 2 (two) times daily. 60 capsule 3   Facility-Administered Medications Prior to Visit  Medication Dose Route Frequency Provider Last Rate Last Dose  . 0.9 %  sodium chloride infusion  500 mL Intravenous Continuous Irene Shipper, MD        ROS Review of Systems  Constitutional: Negative for appetite change, fatigue and  unexpected weight change.  HENT: Negative for congestion, nosebleeds, sneezing, sore throat and trouble swallowing.   Eyes: Negative for itching and visual disturbance.  Respiratory: Negative for cough.   Cardiovascular: Negative for chest pain, palpitations and leg swelling.  Gastrointestinal: Negative for abdominal distention, blood in stool, diarrhea and nausea.  Genitourinary: Negative for frequency and hematuria.  Musculoskeletal: Positive for arthralgias and gait problem. Negative for back pain, joint swelling and neck pain.  Skin: Negative for rash.  Neurological: Negative for dizziness, tremors, speech difficulty and weakness.  Psychiatric/Behavioral: Negative for agitation, dysphoric mood and sleep disturbance. The patient is not nervous/anxious.     Objective:  BP 130/82 (BP Location: Left Arm, Patient Position: Sitting, Cuff Size: Large)   Pulse 82   Temp 98.2 F (36.8 C) (Oral)   Ht 5' 10.5" (1.791 m)   Wt 200 lb (90.7 kg)   SpO2 98%   BMI 28.29 kg/m   BP Readings from Last 3 Encounters:  06/28/17 130/82  05/05/17 136/74  04/09/17 136/70    Wt Readings from Last 3 Encounters:  06/28/17 200 lb (90.7 kg)  05/05/17 202 lb (91.6 kg)  04/09/17 200 lb (90.7 kg)    Physical Exam  Constitutional: He is oriented to person, place, and time. He appears well-developed. No distress.  NAD  HENT:  Mouth/Throat: Oropharynx is clear and moist.  Eyes: Pupils are equal, round, and reactive to light. Conjunctivae are normal.  Neck: Normal range of motion. No JVD present. No thyromegaly present.  Cardiovascular: Normal rate, regular rhythm, normal heart sounds and intact distal pulses. Exam reveals no gallop and no friction rub.  No murmur heard. Pulmonary/Chest: Effort normal and breath sounds normal. No respiratory distress. He has no wheezes. He has no rales. He exhibits no tenderness.  Abdominal: Soft. Bowel sounds are normal. He exhibits no distension and no mass. There is  no tenderness. There is no rebound and no guarding.  Musculoskeletal: Normal range of motion. He exhibits tenderness. He exhibits no edema.  Lymphadenopathy:    He has no cervical adenopathy.  Neurological: He is alert and oriented to person, place, and time. He has normal reflexes. No cranial nerve deficit. He exhibits normal muscle tone. He displays a negative Romberg sign. Coordination and gait normal.  Skin: Skin is warm and dry. No rash noted.  Psychiatric: He has a normal mood and affect. His behavior is normal. Judgment and thought content normal.   Stiff B hips R hip is tender laterally to palpation R troch is tender w/hip ROM Str leg elev (-) B   Lab Results  Component Value Date   WBC 6.0 07/24/2016   HGB 12.2 (L) 07/24/2016   HCT 35.7 (L) 07/24/2016   PLT 219.0 07/24/2016   GLUCOSE 231 (H) 11/27/2016   CHOL 139 04/15/2017   TRIG 157 (H) 04/15/2017   HDL 33 (L) 04/15/2017   LDLDIRECT 51.0 07/24/2016   LDLCALC 75 04/15/2017   ALT 22 07/24/2016   AST 15 07/24/2016   NA 139 11/27/2016   K 4.0 11/27/2016   CL 102 11/27/2016   CREATININE 1.29 (H) 11/27/2016   BUN 27 11/27/2016   CO2 21 11/27/2016   TSH 1.880 04/15/2017   PSA 1.75 07/24/2016   INR 0.95 08/05/2011   HGBA1C 7.0 (H) 10/20/2016    Dg Chest 2 View  Result Date: 10/26/2016 CLINICAL DATA:  Dry cough. EXAM: CHEST  2 VIEW COMPARISON:  04/23/2014 . FINDINGS: Mediastinum and hilar structures normal. Heart size normal. Right base pleuroparenchymal scarring again noted. No pleural effusion or pneumothorax. 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 No interim change. No pneumothorax. no acute bony abnormality. IMPRESSION: No acute abnormality identified. Right base pleuroparenchymal scarring. Stable eventration right hemidiaphragm . Electronically Signed   By: Marcello Moores  Register   On: 10/26/2016 13:18    Assessment & Plan:   There are no diagnoses linked to this  encounter. I am having Andrew Malone. Andrew Noa Sr. maintain his tamsulosin, Vitamin B-12, loratadine, ammonium lactate, metFORMIN, Cholecalciferol, fluticasone, atorvastatin, clotrimazole-betamethasone, clopidogrel, citalopram, losartan-hydrochlorothiazide, pantoprazole, and acyclovir. We will continue to administer sodium chloride.  Meds ordered this encounter  Medications  . acyclovir (ZOVIRAX) 200 MG capsule    Sig: Take 1 capsule (200 mg total) by mouth 2 (two) times daily.    Dispense:  60 capsule    Refill:  3     Follow-up: No follow-ups on file.  Walker Kehr, MD

## 2017-07-06 ENCOUNTER — Other Ambulatory Visit: Payer: Self-pay | Admitting: Cardiology

## 2017-07-06 NOTE — Telephone Encounter (Signed)
REFILL 

## 2017-07-07 DIAGNOSIS — M25551 Pain in right hip: Secondary | ICD-10-CM | POA: Diagnosis not present

## 2017-07-07 DIAGNOSIS — M25561 Pain in right knee: Secondary | ICD-10-CM | POA: Diagnosis not present

## 2017-07-07 DIAGNOSIS — M5441 Lumbago with sciatica, right side: Secondary | ICD-10-CM | POA: Diagnosis not present

## 2017-07-14 DIAGNOSIS — M4306 Spondylolysis, lumbar region: Secondary | ICD-10-CM | POA: Diagnosis not present

## 2017-07-14 DIAGNOSIS — S76912D Strain of unspecified muscles, fascia and tendons at thigh level, left thigh, subsequent encounter: Secondary | ICD-10-CM | POA: Diagnosis not present

## 2017-07-14 DIAGNOSIS — M545 Low back pain: Secondary | ICD-10-CM | POA: Diagnosis not present

## 2017-07-14 DIAGNOSIS — S76911D Strain of unspecified muscles, fascia and tendons at thigh level, right thigh, subsequent encounter: Secondary | ICD-10-CM | POA: Diagnosis not present

## 2017-07-28 ENCOUNTER — Other Ambulatory Visit: Payer: Self-pay

## 2017-07-28 ENCOUNTER — Ambulatory Visit (HOSPITAL_COMMUNITY): Payer: Medicare Other | Attending: Orthopaedic Surgery | Admitting: Physical Therapy

## 2017-07-28 ENCOUNTER — Encounter (HOSPITAL_COMMUNITY): Payer: Self-pay | Admitting: Physical Therapy

## 2017-07-28 DIAGNOSIS — M25551 Pain in right hip: Secondary | ICD-10-CM

## 2017-07-28 DIAGNOSIS — M25552 Pain in left hip: Secondary | ICD-10-CM | POA: Insufficient documentation

## 2017-07-28 NOTE — Patient Instructions (Addendum)
Backward Bend (Standing)    Arch backward to make hollow of back deeper. Hold ____ seconds. Repeat ____ times per set. Do ____ sets per session. Do ____ sessions per day.  http://orth.exer.us/178   Copyright  VHI. All rights reserved.  Hamstring Stretch: Active    Support behind right knee. Starting with knee bent, attempt to straighten knee until a comfortable stretch is felt in back of thigh. Hold __30__ seconds. Repeat __1__ times per set. Do __2__ sets per session. Do _2___ sessions per day.  http://orth.exer.us/158   Copyright  VHI. All rights reserved.  Knee-to-Chest Stretch: Unilateral    With hand behind right knee, pull knee in to chest until a comfortable stretch is felt in lower back and buttocks. Keep back relaxed. Hold __30__ seconds. Repeat _3___ times per set. Do 1____ sets per session. Do __2__ sessions per day.  http://orth.exer.us/126   Copyright  VHI. All rights reserved.  Piriformis Stretch, Sitting    Sit, one ankle on opposite knee, same-side hand on crossed knee. Push down on knee, keeping spine straight. Lean torso forward, with flat back, until tension is felt in hamstrings and gluteals of crossed-leg side. Hold _30__ seconds.  Repeat __3_ times per session. Do __3_ sessions per day.  Copyright  VHI. All rights reserved.  Scapular Retraction (Standing)    With arms at sides, pinch shoulder blades together. Repeat _10___ times per set. Do _1___ sets per session. Do ____2 sessions per day.  http://orth.exer.us/944   Copyright  VHI. All rights reserved.
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
## 2017-07-30 ENCOUNTER — Ambulatory Visit: Payer: Medicare Other

## 2017-08-04 ENCOUNTER — Telehealth: Payer: Self-pay | Admitting: Cardiology

## 2017-08-04 ENCOUNTER — Encounter (HOSPITAL_COMMUNITY): Payer: Self-pay | Admitting: Physical Therapy

## 2017-08-04 ENCOUNTER — Ambulatory Visit (HOSPITAL_COMMUNITY): Payer: Medicare Other | Attending: Orthopaedic Surgery | Admitting: Physical Therapy

## 2017-08-04 DIAGNOSIS — M25551 Pain in right hip: Secondary | ICD-10-CM | POA: Insufficient documentation

## 2017-08-04 DIAGNOSIS — I679 Cerebrovascular disease, unspecified: Secondary | ICD-10-CM
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
## 2017-08-04 NOTE — Telephone Encounter (Signed)
Spoke with pt wife, carotid dopplers scheduled.

## 2017-08-04 NOTE — Telephone Encounter (Signed)
New Message   Pt's wife states that the pt has been having a doppler done every 6 months with his radiologist, but he has yet to schedule them so they would like for [facility] to get a Doppler set up. Please call

## 2017-08-04 NOTE — Telephone Encounter (Signed)
Returned call to wife (ok per DPR) who states patient usually has carotid ultrasound every 6 months after having a carotid stent placed.   She states Dr. Estanislado Pandy usually orders this and follows this but she has tried multiple times to contact his office and no on has called back.    He is 2 months overdue and she is concerned.   She states Dr. Stanford Breed informed them at last OV that he would take over monitoring this if needed.    She is requesting Dr. Stanford Breed order carotid US and follow this from now on.    Advised I would send to MD for review.

## 2017-08-04 NOTE — Telephone Encounter (Signed)
Ok for carotid dopplers [facility]

## 2017-08-04 NOTE — Patient Instructions (Addendum)
Lumbar Rotation (Non-Weight Bearing)    Feet on floor, slowly rock knees from side to side in small, pain-free range of motion. Allow lower back to rotate slightly. Repeat __5__ times per set. Do _1___ sets per session. Do ___2_ sessions per day.  http://orth.exer.us/160   Copyright  VHI. All rights reserved.  Quads / HF, Prone    Lie face down, knees together. Grasp one ankle with same-side hand. Use towel if needed to reach. Gently pull foot toward buttock. Hold _20__ seconds. Repeat __3_ times per session. Do _2__ sessions per day.  Copyright  VHI. All rights reserved.  Quads / HF, Supine    Lie near edge of bed, one leg bent, foot flat on bed. Other leg hanging over edge, relaxed, thigh resting entirely on bed. Bend hanging knee backward keeping thigh in contact with bed. Hold __20_ seconds.  Repeat _3__ times per session. Do _1__ sessions per day.  Copyright  VHI. All rights reserved.

## 2017-08-10 ENCOUNTER — Ambulatory Visit (INDEPENDENT_AMBULATORY_CARE_PROVIDER_SITE_OTHER): Payer: Medicare Other | Admitting: Internal Medicine

## 2017-08-10 ENCOUNTER — Encounter: Payer: Self-pay | Admitting: Internal Medicine

## 2017-08-10 DIAGNOSIS — I10 Essential (primary) hypertension: Secondary | ICD-10-CM | POA: Diagnosis not present

## 2017-08-10 DIAGNOSIS — E538 Deficiency of other specified B group vitamins: Secondary | ICD-10-CM

## 2017-08-10 DIAGNOSIS — E785 Hyperlipidemia, unspecified: Secondary | ICD-10-CM

## 2017-08-10 DIAGNOSIS — I2510 Atherosclerotic heart disease of native coronary artery without angina pectoris: Secondary | ICD-10-CM | POA: Diagnosis not present

## 2017-08-10 DIAGNOSIS — F329 Major depressive disorder, single episode, unspecified: Secondary | ICD-10-CM | POA: Diagnosis not present

## 2017-08-10 DIAGNOSIS — R000 Tachycardia, unspecified: Secondary | ICD-10-CM | POA: Diagnosis not present

## 2017-08-10 MED ORDER — METOPROLOL SUCCINATE ER 25 MG PO TB24: 12.5000 mg | ORAL_TABLET | Freq: Every day | ORAL | 11 refills | Status: DC

## 2017-08-10 NOTE — Progress Notes (Signed)
Subjective:  [name]: [name], male    DOB: [birth year]  Age: 77 y.o. MRN: [id number]  CC: No chief complaint on file.   HPI IKAIKA SHOWERS Sr. presents for OA, anxiety, B12 def, CAD f/u. On CPAP - not functioning well  Outpatient Medications Prior to Visit  Medication Sig Dispense Refill  . acyclovir (ZOVIRAX) 200 MG capsule Take 1 capsule (200 mg total) by mouth 2 (two) times daily. 60 capsule 3  . ammonium lactate (LAC-HYDRIN) 12 % lotion APPLY  TO THE AFFECTED AREA BID  6  . atorvastatin (LIPITOR) 20 MG tablet Take 1 tablet (20 mg total) by mouth daily. 90 tablet 3  . Cholecalciferol 1000 units tablet Take 1,000 Units by mouth daily.    . citalopram (CELEXA) 10 MG tablet Take 1 tablet (10 mg total) by mouth daily. 90 tablet 3  . clopidogrel (PLAVIX) 75 MG tablet Take 1 tablet (75 mg total) by mouth daily. 90 tablet 1  . clotrimazole-betamethasone (LOTRISONE) cream Apply 1 application topically 2 (two) times daily. 45 g 2  . Cyanocobalamin (VITAMIN B-12) 1000 MCG SUBL Place 1 tablet (1,000 mcg total) under the tongue daily. 100 tablet 3  . fluticasone (FLONASE) 50 MCG/ACT nasal spray Place 2 sprays into both nostrils daily. 16 g 3  . loratadine (CLARITIN) 10 MG tablet Take 1 tablet (10 mg total) by mouth daily. 100 tablet 3  . losartan-hydrochlorothiazide (HYZAAR) 100-12.5 MG tablet TAKE 1 TABLET BY MOUTH DAILY 30 tablet 7  . metFORMIN (GLUCOPHAGE-XR) 500 MG 24 hr tablet Take 500 mg by mouth 2 (two) times daily.  3  . pantoprazole (PROTONIX) 40 MG tablet Take 1 tablet (40 mg total) by mouth daily. 90 tablet 1  . Tamsulosin HCl (FLOMAX) 0.4 MG CAPS Take 1 capsule by mouth at bedtime.     Facility-Administered Medications Prior to Visit  Medication Dose Route Frequency Provider Last Rate Last Dose  . 0.9 %  sodium chloride infusion  500 mL Intravenous Continuous Irene Shipper, MD        ROS: Review of Systems  Constitutional: Positive for fatigue. Negative for  appetite change and unexpected weight change.  HENT: Negative for congestion, nosebleeds, sneezing, sore throat and trouble swallowing.   Eyes: Negative for itching and visual disturbance.  Respiratory: Negative for cough.   Cardiovascular: Negative for chest pain, palpitations and leg swelling.  Gastrointestinal: Negative for abdominal distention, blood in stool, diarrhea and nausea.  Genitourinary: Negative for frequency and hematuria.  Musculoskeletal: Positive for arthralgias, back pain and gait problem. Negative for joint swelling and neck pain.  Skin: Negative for rash.  Neurological: Negative for dizziness, tremors, speech difficulty and weakness.  Psychiatric/Behavioral: Negative for agitation, dysphoric mood and sleep disturbance. The patient is not nervous/anxious.     Objective:  BP 138/62 (BP Location: Left Arm, Patient Position: Sitting, Cuff Size: Large)   Pulse 94   Temp 98.7 F (37.1 C) (Oral)   Ht 5' 10.5" (1.791 m)   Wt 201 lb (91.2 kg)   SpO2 98%   BMI 28.43 kg/m   BP Readings from Last 3 Encounters:  08/10/17 138/62  06/28/17 130/82  05/05/17 136/74    Wt Readings from Last 3 Encounters:  08/10/17 201 lb (91.2 kg)  06/28/17 200 lb (90.7 kg)  05/05/17 202 lb (91.6 kg)    Physical Exam  Constitutional: He is oriented to person, place, and time. He appears well-developed. No distress.  NAD  HENT:  Mouth/Throat: Oropharynx is clear and moist.  Eyes: Pupils are equal, round, and reactive to light. Conjunctivae are normal.  Neck: Normal range of motion. No JVD present. No thyromegaly present.  Cardiovascular: Normal rate, regular rhythm and intact distal pulses. Exam reveals no gallop and no friction rub.  Murmur heard. Pulmonary/Chest: Effort normal and breath sounds normal. No respiratory distress. He has no wheezes. He has no rales. He exhibits no tenderness.  Abdominal: Soft. Bowel sounds are normal. He exhibits no distension and no mass. There is no  tenderness. There is no rebound and no guarding.  Musculoskeletal: Normal range of motion. He exhibits tenderness. He exhibits no edema.  Lymphadenopathy:    He has no cervical adenopathy.  Neurological: He is alert and oriented to person, place, and time. He has normal reflexes. No cranial nerve deficit. He exhibits normal muscle tone. He displays a negative Romberg sign. Coordination and gait normal.  Skin: Skin is warm and dry. No rash noted.  Psychiatric: He has a normal mood and affect. His behavior is normal. Judgment and thought content normal.  stiff LS  HR 90  Lab Results  Component Value Date   WBC 6.0 07/24/2016   HGB 12.2 (L) 07/24/2016   HCT 35.7 (L) 07/24/2016   PLT 219.0 07/24/2016   GLUCOSE 231 (H) 11/27/2016   CHOL 139 04/15/2017   TRIG 157 (H) 04/15/2017   HDL 33 (L) 04/15/2017   LDLDIRECT 51.0 07/24/2016   LDLCALC 75 04/15/2017   ALT 22 07/24/2016   AST 15 07/24/2016   NA 139 11/27/2016   K 4.0 11/27/2016   CL 102 11/27/2016   CREATININE 1.29 (H) 11/27/2016   BUN 27 11/27/2016   CO2 21 11/27/2016   TSH 1.880 04/15/2017   PSA 1.75 07/24/2016   INR 0.95 08/05/2011   HGBA1C 7.0 (H) 10/20/2016    Dg Hip Unilat With Pelvis 2-3 Views Right  Result Date: 06/29/2017 CLINICAL DATA:  Two months of right hip pain.  No known injury. EXAM: DG HIP (WITH OR WITHOUT PELVIS) 2-3V RIGHT COMPARISON:  Limited views of the right hip from a lumbar spine series of January 14, 2007 FINDINGS: The bones are subjectively adequately mineralized. There is mild symmetric narrowing of the hip joint space. There is no significant osteophyte formation. There is no acute or healing fracture. The observed portions of the right hemipelvis are normal. IMPRESSION: Mild symmetric narrowing of the right hip joint space consistent with osteoarthritis. Electronically Signed   By: David  Martinique M.D.   On: 06/29/2017 07:30    Assessment & Plan:   There are no diagnoses linked to this  encounter.   No orders of the defined types were placed in this encounter.    Follow-up: No follow-ups on file.  Walker Kehr, MD

## 2017-08-10 NOTE — Assessment & Plan Note (Signed)
Plavix, Losartan HCT, Lipitor 

## 2017-08-10 NOTE — Assessment & Plan Note (Signed)
Lipitor 

## 2017-08-10 NOTE — Assessment & Plan Note (Signed)
On Citalopram at HS 

## 2017-08-10 NOTE — Assessment & Plan Note (Signed)
On B12 

## 2017-08-10 NOTE — Assessment & Plan Note (Signed)
Losartan HCT 

## 2017-08-10 NOTE — Assessment & Plan Note (Signed)
Chronic F/u w/cardiology - Dr Stanford Breed Toprol XL 12.5 mg at HS if HR > 90

## 2017-08-11 ENCOUNTER — Ambulatory Visit (HOSPITAL_COMMUNITY): Payer: Medicare Other

## 2017-08-11 ENCOUNTER — Telehealth (HOSPITAL_COMMUNITY): Payer: Self-pay | Admitting: Internal Medicine

## 2017-08-11 NOTE — Telephone Encounter (Signed)
08/11/17  pt called to cx said he was hurting too bad today

## 2017-08-12 ENCOUNTER — Ambulatory Visit (HOSPITAL_COMMUNITY)
Admission: RE | Admit: 2017-08-12 | Discharge: 2017-08-12 | Disposition: A | Discharge status: Home / Self Care | Payer: Medicare Other | Source: Ambulatory Visit | Attending: Cardiology | Admitting: Cardiology

## 2017-08-12 DIAGNOSIS — I6522 Occlusion and stenosis of left carotid artery: Secondary | ICD-10-CM | POA: Insufficient documentation

## 2017-08-12 DIAGNOSIS — Z959 Presence of cardiac and vascular implant and graft, unspecified: Secondary | ICD-10-CM | POA: Diagnosis not present

## 2017-08-12 DIAGNOSIS — I679 Cerebrovascular disease, unspecified: Secondary | ICD-10-CM

## 2017-08-16 ENCOUNTER — Other Ambulatory Visit: Payer: Self-pay | Admitting: *Deleted

## 2017-08-16 DIAGNOSIS — I679 Cerebrovascular disease, unspecified: Secondary | ICD-10-CM

## 2017-08-18 ENCOUNTER — Ambulatory Visit (HOSPITAL_COMMUNITY): Payer: Medicare Other | Admitting: Physical Therapy

## 2017-08-18 ENCOUNTER — Telehealth (HOSPITAL_COMMUNITY): Payer: Self-pay | Admitting: Physical Therapy

## 2017-08-18 ENCOUNTER — Telehealth (HOSPITAL_COMMUNITY): Payer: Self-pay

## 2017-08-18 DIAGNOSIS — M4306 Spondylolysis, lumbar region: Secondary | ICD-10-CM | POA: Diagnosis not present

## 2017-08-18 NOTE — Telephone Encounter (Signed)
Spoke to pt's wife. She would like to start doing her f/u's through Dr. Stanford Breed because it's easier. She says she has had a hard time getting in touch with the office and Radiology didn't know who Dr. Estanislado Pandy was so it would just be easier to go through Dr. Stanford Breed. AW

## 2017-08-18 NOTE — Telephone Encounter (Signed)
PT did not show for appointment.  Called residence and spoke with wife who reports patient called and cancelled due to illness.  No voice messages were left regarding this and neither secretary had received a message.  Teena Irani, PTA/CLT [phone];

## 2017-08-18 NOTE — Telephone Encounter (Signed)
Patient called to cancel his appt not sure who he spoke with so his wife call a second time to let us know he was not coming today.

## 2017-08-18 NOTE — Telephone Encounter (Signed)
Called regarding recent us carotid, no answer, left vm. AW 

## 2017-08-20 ENCOUNTER — Other Ambulatory Visit: Payer: Self-pay | Admitting: Orthopaedic Surgery

## 2017-08-20 DIAGNOSIS — M5441 Lumbago with sciatica, right side: Secondary | ICD-10-CM

## 2017-08-23 ENCOUNTER — Telehealth (HOSPITAL_COMMUNITY): Payer: Self-pay | Admitting: Physical Therapy

## 2017-08-23 NOTE — Telephone Encounter (Signed)
He will go back to the MD and have a MRI - he will call us back when he gets results-plans to keep 7/10 apptment> NF 08/23/17

## 2017-08-25 ENCOUNTER — Encounter (HOSPITAL_COMMUNITY): Payer: Medicare Other | Admitting: Physical Therapy

## 2017-08-30 ENCOUNTER — Ambulatory Visit
Admission: RE | Admit: 2017-08-30 | Discharge: 2017-08-30 | Disposition: A | Discharge status: Home / Self Care | Payer: Medicare Other | Source: Ambulatory Visit | Attending: Orthopaedic Surgery | Admitting: Orthopaedic Surgery

## 2017-08-30 DIAGNOSIS — M545 Low back pain: Secondary | ICD-10-CM | POA: Diagnosis not present

## 2017-08-30 DIAGNOSIS — M5441 Lumbago with sciatica, right side: Secondary | ICD-10-CM

## 2017-09-01 ENCOUNTER — Encounter (HOSPITAL_COMMUNITY): Payer: Medicare Other

## 2017-09-06 DIAGNOSIS — M5441 Lumbago with sciatica, right side: Secondary | ICD-10-CM | POA: Diagnosis not present

## 2017-09-07 ENCOUNTER — Other Ambulatory Visit: Payer: Self-pay | Admitting: *Deleted

## 2017-09-07 ENCOUNTER — Telehealth (HOSPITAL_COMMUNITY): Payer: Self-pay

## 2017-09-07 MED ORDER — ATORVASTATIN CALCIUM 20 MG PO TABS: 20.0000 mg | ORAL_TABLET | Freq: Every day | ORAL | 3 refills | Status: DC

## 2017-09-07 NOTE — Telephone Encounter (Signed)
Wife Hassan Rowan) Called requested pt be put on hold for two weeks. Pt will have injections and see if that helps, then call back to let us know if they want to continue PT or not

## 2017-09-08 ENCOUNTER — Ambulatory Visit (HOSPITAL_COMMUNITY): Payer: Medicare Other

## 2017-09-29 ENCOUNTER — Encounter (HOSPITAL_COMMUNITY): Payer: Self-pay | Admitting: Physical Therapy

## 2017-09-29 NOTE — Therapy (Signed)
[facility] [place], [place], [address] Phone: [phone]   Fax:  [phone]  Patient Details  Name: Andrew Malone Sr. MRN: [id number] Date of Birth: 12/01/1940 Referring Provider:  No ref. provider found  Encounter Date: 09/29/2017   PHYSICAL THERAPY DISCHARGE SUMMARY  Visits from Start of Care: 4  Current functional level related to goals / functional outcomes: Unknown pt has not shown since second visit.   Remaining deficits: pain   Education / Equipment: HEP Plan: Patient agrees to discharge.  Patient goals were not met. Patient is being discharged due to not returning since the last visit.  ?????      Rayetta Humphrey, PT CLT [phone] 09/29/2017, 3:04 PM  [facility] [address] [place], [place], [address] Phone: [phone]   Fax:  [phone]

## 2017-10-07 ENCOUNTER — Telehealth: Payer: Self-pay | Admitting: Internal Medicine

## 2017-10-07 MED ORDER — CLOPIDOGREL BISULFATE 75 MG PO TABS: 75.0000 mg | ORAL_TABLET | Freq: Every day | ORAL | 1 refills | Status: DC
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
## 2017-10-07 NOTE — Telephone Encounter (Signed)
Copied from [place] [phone]. Topic: Quick Communication - Rx Refill/Question >> Oct 07, 2017  9:30 AM Carolyn Stare wrote: Medication   clopidogrel (PLAVIX) 75 MG tablet   Has the patient contacted their pharmacy yes    Preferred Pharmacy   [facility]   Agent: Please be advised that RX refills may take up to 3 business days. We ask that you follow-up with your pharmacy.

## 2017-10-07 NOTE — Telephone Encounter (Signed)
RX sent by [facility]

## 2017-10-07 NOTE — Telephone Encounter (Signed)
Copied from [place] [phone]. Topic: Quick Communication - Rx Refill/Question >> Oct 07, 2017  9:27 AM Carolyn Stare wrote: Medication   clopidogrel (PLAVIX) 75 MG tablet   Preferred Pharmacy    [facility] [address]    Agent: Please be advised that RX refills may take up to 3 business days. We ask that you follow-up with your pharmacy.

## 2017-10-11 ENCOUNTER — Ambulatory Visit (INDEPENDENT_AMBULATORY_CARE_PROVIDER_SITE_OTHER): Payer: Medicare Other | Admitting: Cardiology

## 2017-10-11 ENCOUNTER — Encounter: Payer: Self-pay | Admitting: Cardiology

## 2017-10-11 VITALS — BP 114/52 | HR 82 | Ht 70.5 in | Wt 202.0 lb

## 2017-10-11 DIAGNOSIS — E7800 Pure hypercholesterolemia, unspecified: Secondary | ICD-10-CM | POA: Diagnosis not present

## 2017-10-11 DIAGNOSIS — I2510 Atherosclerotic heart disease of native coronary artery without angina pectoris: Secondary | ICD-10-CM | POA: Diagnosis not present

## 2017-10-11 DIAGNOSIS — I350 Nonrheumatic aortic (valve) stenosis: Secondary | ICD-10-CM | POA: Diagnosis not present

## 2017-10-11 DIAGNOSIS — I10 Essential (primary) hypertension: Secondary | ICD-10-CM | POA: Diagnosis not present

## 2017-10-11 NOTE — Patient Instructions (Signed)
Your physician wants you to follow-up in: 6 MONTHS WITH DR CRENSHAW You will receive a reminder letter in the mail two months in advance. If you don't receive a letter, please call our office to schedule the follow-up appointment.   If you need a refill on your cardiac medications before your next appointment, please call your pharmacy.  

## 2017-11-12 ENCOUNTER — Ambulatory Visit (INDEPENDENT_AMBULATORY_CARE_PROVIDER_SITE_OTHER): Payer: Medicare Other | Admitting: Internal Medicine

## 2017-11-12 ENCOUNTER — Other Ambulatory Visit (INDEPENDENT_AMBULATORY_CARE_PROVIDER_SITE_OTHER): Payer: Medicare Other

## 2017-11-12 ENCOUNTER — Encounter: Payer: Self-pay | Admitting: Internal Medicine

## 2017-11-12 VITALS — BP 124/72 | HR 76 | Temp 98.6°F | Ht 70.5 in | Wt 204.0 lb

## 2017-11-12 DIAGNOSIS — M5440 Lumbago with sciatica, unspecified side: Secondary | ICD-10-CM

## 2017-11-12 DIAGNOSIS — E1159 Type 2 diabetes mellitus with other circulatory complications: Secondary | ICD-10-CM

## 2017-11-12 DIAGNOSIS — G8929 Other chronic pain: Secondary | ICD-10-CM

## 2017-11-12 DIAGNOSIS — E785 Hyperlipidemia, unspecified: Secondary | ICD-10-CM | POA: Diagnosis not present

## 2017-11-12 DIAGNOSIS — F419 Anxiety disorder, unspecified: Secondary | ICD-10-CM

## 2017-11-12 DIAGNOSIS — Z23 Encounter for immunization: Secondary | ICD-10-CM | POA: Diagnosis not present

## 2017-11-12 DIAGNOSIS — I2510 Atherosclerotic heart disease of native coronary artery without angina pectoris: Secondary | ICD-10-CM

## 2017-11-12 DIAGNOSIS — E538 Deficiency of other specified B group vitamins: Secondary | ICD-10-CM

## 2017-11-12 LAB — BASIC METABOLIC PANEL
BUN: 23 mg/dL (ref 6–23)
CALCIUM: 9.6 mg/dL (ref 8.4–10.5)
CO2: 26 mEq/L (ref 19–32)
Chloride: 105 mEq/L (ref 96–112)
Creatinine, Ser: 1.17 mg/dL (ref 0.40–1.50)
GFR: 64.57 mL/min (ref >60.00)
Glucose, Bld: 190 mg/dL — ABNORMAL HIGH (ref 70–99)
POTASSIUM: 4.1 meq/L (ref 3.5–5.1)
Sodium: 140 mEq/L (ref 135–145)

## 2017-11-12 LAB — HEMOGLOBIN A1C: HEMOGLOBIN A1C: 7.4 % — AB (ref 4.6–6.5)

## 2017-11-12 NOTE — Addendum Note (Signed)
Addended by: Karren Cobble on: 11/12/2017 11:50 AM   Modules accepted: Orders

## 2017-11-12 NOTE — Assessment & Plan Note (Signed)
Klonopin prn  Potential benefits of a long term benzodiazepines  use as well as potential risks  and complications were explained to the patient and were aknowledged. 

## 2017-11-12 NOTE — Assessment & Plan Note (Signed)
On B12 

## 2017-11-12 NOTE — Assessment & Plan Note (Signed)
Norco prn  Potential benefits of a long term opioids use as well as potential risks (i.e. addiction risk, apnea etc) and complications (i.e. Somnolence, constipation and others) were explained to the patient and were aknowledged. 

## 2017-11-12 NOTE — Progress Notes (Signed)
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 MRN: [id number]  CC: No chief complaint on file.   HPI Andrew FLANAGAN Sr. presents for AS, HTN, CAD f/u. Working in the yard cutting trees, grass etc  Outpatient Medications Prior to Visit  Medication Sig Dispense Refill  . acyclovir (ZOVIRAX) 200 MG capsule Take 1 capsule (200 mg total) by mouth 2 (two) times daily. 60 capsule 3  . ammonium lactate (LAC-HYDRIN) 12 % lotion APPLY  TO THE AFFECTED AREA BID  6  . atorvastatin (LIPITOR) 20 MG tablet Take 1 tablet (20 mg total) by mouth daily. 90 tablet 3  . Cholecalciferol 1000 units tablet Take 1,000 Units by mouth daily.    . citalopram (CELEXA) 10 MG tablet Take 1 tablet (10 mg total) by mouth daily. 90 tablet 3  . clopidogrel (PLAVIX) 75 MG tablet Take 1 tablet (75 mg total) by mouth daily. 90 tablet 1  . clotrimazole-betamethasone (LOTRISONE) cream Apply 1 application topically 2 (two) times daily. 45 g 2  . Cyanocobalamin (VITAMIN B-12) 1000 MCG SUBL Place 1 tablet (1,000 mcg total) under the tongue daily. 100 tablet 3  . fluticasone (FLONASE) 50 MCG/ACT nasal spray Place 2 sprays into both nostrils daily. 16 g 3  . loratadine (CLARITIN) 10 MG tablet Take 1 tablet (10 mg total) by mouth daily. 100 tablet 3  . losartan-hydrochlorothiazide (HYZAAR) 100-12.5 MG tablet TAKE 1 TABLET BY MOUTH DAILY 30 tablet 7  . metFORMIN (GLUCOPHAGE-XR) 500 MG 24 hr tablet Take 500 mg by mouth 2 (two) times daily.  3  . metoprolol succinate (TOPROL-XL) 25 MG 24 hr tablet Take 0.5 tablets (12.5 mg total) by mouth daily. Take at hs 30 tablet 11  . pantoprazole (PROTONIX) 40 MG tablet Take 1 tablet (40 mg total) by mouth daily. 90 tablet 1  . Tamsulosin HCl (FLOMAX) 0.4 MG CAPS Take 1 capsule by mouth at bedtime.     No facility-administered medications prior to visit.     ROS: Review of Systems  Constitutional: Negative for appetite change, fatigue and unexpected  weight change.  HENT: Negative for congestion, nosebleeds, sneezing, sore throat and trouble swallowing.   Eyes: Negative for itching and visual disturbance.  Respiratory: Negative for cough and chest tightness.   Cardiovascular: Negative for chest pain, palpitations and leg swelling.  Gastrointestinal: Negative for abdominal distention, blood in stool, diarrhea and nausea.  Genitourinary: Negative for frequency and hematuria.  Musculoskeletal: Positive for arthralgias and back pain. Negative for gait problem, joint swelling and neck pain.  Skin: Negative for rash.  Neurological: Negative for dizziness, tremors, speech difficulty and weakness.  Psychiatric/Behavioral: Negative for agitation, dysphoric mood, sleep disturbance and suicidal ideas. The patient is nervous/anxious.     Objective:  BP 124/72 (BP Location: Left Arm, Patient Position: Sitting, Cuff Size: Normal)   Pulse 76   Temp 98.6 F (37 C) (Oral)   Ht 5' 10.5" (1.791 m)   Wt 204 lb (92.5 kg)   SpO2 99%   BMI 28.86 kg/m   BP Readings from Last 3 Encounters:  11/12/17 124/72  10/11/17 (!) 114/52  08/10/17 138/62    Wt Readings from Last 3 Encounters:  11/12/17 204 lb (92.5 kg)  10/11/17 202 lb (91.6 kg)  08/10/17 201 lb (91.2 kg)    Physical Exam  Constitutional: He is oriented to person, place, and time. He appears well-developed. No distress.  NAD  HENT:  Mouth/Throat:  Oropharynx is clear and moist.  Eyes: Pupils are equal, round, and reactive to light. Conjunctivae are normal.  Neck: Normal range of motion. No JVD present. No thyromegaly present.  Cardiovascular: Normal rate, regular rhythm and intact distal pulses. Exam reveals no gallop and no friction rub.  Murmur heard. Pulmonary/Chest: Effort normal and breath sounds normal. No respiratory distress. He has no wheezes. He has no rales. He exhibits no tenderness.  Abdominal: Soft. Bowel sounds are normal. He exhibits no distension and no mass. There is  no tenderness. There is no rebound and no guarding.  Musculoskeletal: Normal range of motion. He exhibits tenderness. He exhibits no edema.  Lymphadenopathy:    He has no cervical adenopathy.  Neurological: He is alert and oriented to person, place, and time. He has normal reflexes. No cranial nerve deficit. He exhibits normal muscle tone. He displays a negative Romberg sign. Coordination and gait normal.  Skin: Skin is warm and dry. No rash noted.  Psychiatric: He has a normal mood and affect. His behavior is normal. Judgment and thought content normal.  stiff back  Lab Results  Component Value Date   WBC 6.0 07/24/2016   HGB 12.2 (L) 07/24/2016   HCT 35.7 (L) 07/24/2016   PLT 219.0 07/24/2016   GLUCOSE 231 (H) 11/27/2016   CHOL 139 04/15/2017   TRIG 157 (H) 04/15/2017   HDL 33 (L) 04/15/2017   LDLDIRECT 51.0 07/24/2016   LDLCALC 75 04/15/2017   ALT 22 07/24/2016   AST 15 07/24/2016   NA 139 11/27/2016   K 4.0 11/27/2016   CL 102 11/27/2016   CREATININE 1.29 (H) 11/27/2016   BUN 27 11/27/2016   CO2 21 11/27/2016   TSH 1.880 04/15/2017   PSA 1.75 07/24/2016   INR 0.95 08/05/2011   HGBA1C 7.0 (H) 10/20/2016    Mr Lumbar Spine Wo Contrast  Result Date: 08/31/2017 CLINICAL DATA:  Low back pain radiating to both hips. EXAM: MRI LUMBAR SPINE WITHOUT CONTRAST TECHNIQUE: Multiplanar, multisequence MR imaging of the lumbar spine was performed. No intravenous contrast was administered. COMPARISON:  Lumbar spine radiograph 07/16/2014 FINDINGS: Segmentation: Normal. The lowest disc space is considered to be L5-S1. Alignment: Grade 1 anterolisthesis at L5-S1 secondary to bilateral pars interarticularis defects. Vertebrae: No acute compression fracture, discitis-osteomyelitis of focal marrow lesion. Conus medullaris and cauda equina: The conus medullaris terminates at the L1 level. The cauda equina and conus medullaris are both normal. Paraspinal and other soft tissues: The visualized aorta,  IVC and iliac vessels are normal. The visualized retroperitoneal organs and paraspinal soft tissues are normal. Disc levels: T11-T12 is visualized in the sagittal plane only, with no disc herniation or stenosis. The T12-S1 disc levels are normal. The visualized sacrum is normal. IMPRESSION: 1. Grade 1 L5-S1 anterolisthesis secondary to bilateral pars interarticularis defects, unchanged. 2. No disc herniation, spinal canal stenosis or nerve root impingement. Electronically Signed   By: Ulyses Jarred M.D.   On: 08/31/2017 04:00    Assessment & Plan:   There are no diagnoses linked to this encounter.   No orders of the defined types were placed in this encounter.    Follow-up: No follow-ups on file.  Walker Kehr, MD

## 2017-12-10 ENCOUNTER — Other Ambulatory Visit: Payer: Self-pay

## 2017-12-10 MED ORDER — LOSARTAN POTASSIUM 100 MG PO TABS: 100.0000 mg | ORAL_TABLET | Freq: Every day | ORAL | 0 refills | Status: DC

## 2017-12-10 MED ORDER — HYDROCHLOROTHIAZIDE 12.5 MG PO CAPS: 12.5000 mg | ORAL_CAPSULE | Freq: Every day | ORAL | 0 refills | Status: DC

## 2017-12-15 ENCOUNTER — Other Ambulatory Visit: Payer: Self-pay

## 2017-12-15 ENCOUNTER — Other Ambulatory Visit: Payer: Self-pay | Admitting: Internal Medicine

## 2017-12-15 MED ORDER — PANTOPRAZOLE SODIUM 40 MG PO TBEC: 40.0000 mg | DELAYED_RELEASE_TABLET | Freq: Every day | ORAL | 1 refills | Status: DC

## 2017-12-15 MED ORDER — HYDROCHLOROTHIAZIDE 12.5 MG PO CAPS: 12.5000 mg | ORAL_CAPSULE | Freq: Every day | ORAL | 0 refills | Status: DC

## 2017-12-15 MED ORDER — LOSARTAN POTASSIUM 100 MG PO TABS: 100.0000 mg | ORAL_TABLET | Freq: Every day | ORAL | 0 refills | Status: DC

## 2017-12-15 NOTE — Telephone Encounter (Signed)
Requested Prescriptions  Pending Prescriptions Disp Refills  . pantoprazole (PROTONIX) 40 MG tablet 90 tablet 1    Sig: Take 1 tablet (40 mg total) by mouth daily.     Gastroenterology: Proton Pump Inhibitors Passed - 12/15/2017  9:53 AM      Passed - Valid encounter within last 12 months    Recent Outpatient Visits          1 month ago Dyslipidemia   [place] Plotnikov, Evie Lacks, MD   4 months ago Essential hypertension   [place] Plotnikov, Evie Lacks, MD   5 months ago Hip pain, chronic, right   [place], MD   7 months ago B12 deficiency   [place] Plotnikov, Evie Lacks, MD   9 months ago B12 deficiency   [place], MD      Future Appointments            In 2 months Plotnikov, Evie Lacks, MD [place], [place]

## 2017-12-15 NOTE — Telephone Encounter (Signed)
Copied from [place] [id number]. Topic: Quick Communication - Rx Refill/Question >> Dec 15, 2017  9:46 AM Cecelia Byars, NT wrote: Medication: pantoprazole (PROTONIX) 40 MG tablet   Has the patient contacted their pharmacy? yes  (Agent: If no, request that the patient contact the pharmacy for the refill. (Agent: If yes, when and what did the pharmacy advise?  Preferred Pharmacy (with phone number or street name [facility] [place], [place]. Ruthe Mannan [phone] (Phone) [phone] (Fax)  The pharmacy says they have sent over 2 requests for refills with no response   Agent: Please be advised that RX refills may take up to 3 business days. We ask that you follow-up with your pharmacy.

## 2017-12-23 ENCOUNTER — Encounter: Payer: Self-pay | Admitting: Family Medicine

## 2017-12-23 ENCOUNTER — Ambulatory Visit (INDEPENDENT_AMBULATORY_CARE_PROVIDER_SITE_OTHER): Payer: Medicare Other | Admitting: Family Medicine

## 2017-12-23 ENCOUNTER — Encounter: Payer: Self-pay | Admitting: *Deleted

## 2017-12-23 DIAGNOSIS — I2510 Atherosclerotic heart disease of native coronary artery without angina pectoris: Secondary | ICD-10-CM

## 2017-12-23 DIAGNOSIS — R6889 Other general symptoms and signs: Secondary | ICD-10-CM

## 2017-12-23 DIAGNOSIS — J0110 Acute frontal sinusitis, unspecified: Secondary | ICD-10-CM

## 2017-12-23 LAB — POCT INFLUENZA A/B
INFLUENZA B, POC: NEGATIVE
Influenza A, POC: NEGATIVE

## 2017-12-23 MED ORDER — DOXYCYCLINE HYCLATE 100 MG PO TABS
100.0000 mg | ORAL_TABLET | Freq: Two times a day (BID) | ORAL | 0 refills | Status: DC
Start: 2017-12-23 — End: 2018-07-13

## 2017-12-23 NOTE — Patient Instructions (Signed)
Your symptoms are most likely related to a viral illness. Please drink plenty of water so that your urine is pale yellow or clear. Also, get plenty of rest, use tylenol as needed for discomfort and follow up if symptoms do not improve in 3 to 4 days, worsen, or you develop a fever >101.  You have an antibiotic to take if symptoms are not improving with conservative treatment as discussed.   Sinusitis, Adult Sinusitis is soreness and inflammation of your sinuses. Sinuses are hollow spaces in the bones around your face. They are located:  Around your eyes.  In the middle of your forehead.  Behind your nose.  In your cheekbones.  Your sinuses and nasal passages are lined with a stringy fluid (mucus). Mucus normally drains out of your sinuses. When your nasal tissues get inflamed or swollen, the mucus can get trapped or blocked so air cannot flow through your sinuses. This lets bacteria, viruses, and funguses grow, and that leads to infection. Follow these instructions at home: Medicines  Take, use, or apply over-the-counter and prescription medicines only as told by your doctor. These may include nasal sprays.  If you were prescribed an antibiotic medicine, take it as told by your doctor. Do not stop taking the antibiotic even if you start to feel better. Hydrate and Humidify  Drink enough water to keep your pee (urine) clear or pale yellow.  Use a cool mist humidifier to keep the humidity level in your home above 50%.  Breathe in steam for 10-15 minutes, 3-4 times a day or as told by your doctor. You can do this in the bathroom while a hot shower is running.  Try not to spend time in cool or dry air. Rest  Rest as much as possible.  Sleep with your head raised (elevated).  Make sure to get enough sleep each night. General instructions  Put a warm, moist washcloth on your face 3-4 times a day or as told by your doctor. This will help with discomfort.  Wash your hands often  with soap and water. If there is no soap and water, use hand sanitizer.  Do not smoke. Avoid being around people who are smoking (secondhand smoke).  Keep all follow-up visits as told by your doctor. This is important. Contact a doctor if:  You have a fever.  Your symptoms get worse.  Your symptoms do not get better within 10 days. Get help right away if:  You have a very bad headache.  You cannot stop throwing up (vomiting).  You have pain or swelling around your face or eyes.  You have trouble seeing.  You feel confused.  Your neck is stiff.  You have trouble breathing. This information is not intended to replace advice given to you by your health care provider. Make sure you discuss any questions you have with your health care provider. Document Released: 08/05/2007 Document Revised: 10/13/2015 Document Reviewed: 12/12/2014 Elsevier Interactive Patient Education  [employer].

## 2017-12-23 NOTE — Progress Notes (Signed)
[name]: [name], male   DOB: October 08, 1940, 77 y.o.   MRN: [id number]  PCP: Cassandria Anger, MD  Subjective:  Andrew Malone. is a 77 y.o. year old very pleasant male patient who presents with symptoms including nasal congestion and cough that is not productive  Associated body aches have been present. He reports that body aches are chronic in nature but have worsened during the past few days. Associated frontal sinus pressure present also. -started: 5 days ago , symptoms are not improving, states he feels worse and sinus pressure and rhinitis are worsening -previous treatments: acetaminophen has provided benefit. He does take Claritin daily -sick contacts/travel/risks: denies flu exposure.  -Hx of: allergies No antibiotic use in the last 30 days. He is not a smoker.  Influenza vaccine is UTD.   ROS-denies fever, SOB, NVD, tooth pain  Pertinent Past Medical History- HTN, T2DM, Cerebral artery occlusion with cerebral infarction, Aortic stenois  Medications- reviewed  Current Outpatient Medications  Medication Sig Dispense Refill  . acyclovir (ZOVIRAX) 200 MG capsule Take 1 capsule (200 mg total) by mouth 2 (two) times daily. 60 capsule 3  . ammonium lactate (LAC-HYDRIN) 12 % lotion APPLY  TO THE AFFECTED AREA BID  6  . atorvastatin (LIPITOR) 20 MG tablet Take 1 tablet (20 mg total) by mouth daily. 90 tablet 3  . Cholecalciferol 1000 units tablet Take 1,000 Units by mouth daily.    . citalopram (CELEXA) 10 MG tablet Take 1 tablet (10 mg total) by mouth daily. 90 tablet 3  . clopidogrel (PLAVIX) 75 MG tablet Take 1 tablet (75 mg total) by mouth daily. 90 tablet 1  . clotrimazole-betamethasone (LOTRISONE) cream Apply 1 application topically 2 (two) times daily. 45 g 2  . Cyanocobalamin (VITAMIN B-12) 1000 MCG SUBL Place 1 tablet (1,000 mcg total) under the tongue daily. 100 tablet 3  . fluticasone (FLONASE) 50 MCG/ACT nasal spray Place 2 sprays into both nostrils daily.  16 g 3  . hydrochlorothiazide (MICROZIDE) 12.5 MG capsule Take 1 capsule (12.5 mg total) by mouth daily. 90 capsule 0  . loratadine (CLARITIN) 10 MG tablet Take 1 tablet (10 mg total) by mouth daily. 100 tablet 3  . losartan (COZAAR) 100 MG tablet Take 1 tablet (100 mg total) by mouth daily. 90 tablet 0  . losartan-hydrochlorothiazide (HYZAAR) 100-12.5 MG tablet TAKE 1 TABLET BY MOUTH DAILY 30 tablet 7  . metFORMIN (GLUCOPHAGE-XR) 500 MG 24 hr tablet Take 500 mg by mouth 2 (two) times daily.  3  . metoprolol succinate (TOPROL-XL) 25 MG 24 hr tablet Take 0.5 tablets (12.5 mg total) by mouth daily. Take at hs 30 tablet 11  . pantoprazole (PROTONIX) 40 MG tablet Take 1 tablet (40 mg total) by mouth daily. 90 tablet 1  . Tamsulosin HCl (FLOMAX) 0.4 MG CAPS Take 1 capsule by mouth at bedtime.     No current facility-administered medications for this visit.     Objective: BP 128/76 (BP Location: Left Arm, Patient Position: Sitting, Cuff Size: Normal)   Pulse 88   Temp 98.4 F (36.9 C) (Oral)   Ht 5' 10.5" (1.791 m)   Wt 198 lb (89.8 kg)   SpO2 98%   BMI 28.01 kg/m  Gen: NAD, resting comfortably HEENT: Turbinates erythematous, TMs normal bilaterally, oropharynx without erythema or edema, +post nasal drip present. +frontal sinus tenderness CV: RRR murmur present,  No rubs or gallops Lungs: CTAB no crackles, wheeze, rhonchi Abdomen: soft/nontender/nondistended/normal bowel sounds. No  rebound or guarding.  Ext: no edema Skin: warm, dry, no rash Neuro: grossly normal, moves all extremities  Assessment/Plan: 1. Acute frontal sinusitis, recurrence not specified Patient's symptoms most consistent with sinusitis with increasing sinus pressure and worsening nasal secretions. Discussed likely viral nature and less likely bacterial cause. We reviewed possible treatment options and he will continue conservative measures of increasing fluids, rest, and tylenol as needed.  Also provided with a  prescription for doxycycline to take if not improved in the next 2-3 days.  Advised that if his symptoms were to worsen he should let us know. Return precautions provided.   2. Flu-like symptoms POC Flu: negative today. - POCT Influenza A/B  Finally, we reviewed reasons to return to care including if symptoms worsen or persist or new concerns arise- once again particularly shortness of breath or fever.   Laurita Quint, FNP

## 2018-02-03 ENCOUNTER — Telehealth: Payer: Self-pay | Admitting: Pulmonary Disease

## 2018-02-03 NOTE — Telephone Encounter (Signed)
Spoke with pt's spouse, states that [facility] is telling them to have a letter composed from our office to state that pt is a patient.  This is not normal protocol from [facility] as they have access to Epic. Called and spoke with Corene Cornea, they do not need a letter from our office.  Pt has not been seen sine 2017 and needs a signed SMN from Dr. Halford Chessman, who has already stated that pt needs an office visit before he will sign for additional cpap supplies.  Pt has an ov on 12/20.  Verified appt with pt's spouse.  Nothing further needed at this time.

## 2018-02-07 ENCOUNTER — Ambulatory Visit: Payer: Medicare Other | Admitting: Pulmonary Disease

## 2018-02-15 DIAGNOSIS — R351 Nocturia: Secondary | ICD-10-CM | POA: Diagnosis not present

## 2018-02-15 DIAGNOSIS — R972 Elevated prostate specific antigen [PSA]: Secondary | ICD-10-CM | POA: Diagnosis not present

## 2018-02-15 DIAGNOSIS — N401 Enlarged prostate with lower urinary tract symptoms: Secondary | ICD-10-CM | POA: Diagnosis not present

## 2018-02-18 ENCOUNTER — Ambulatory Visit: Payer: Medicare Other | Admitting: Pulmonary Disease

## 2018-02-28 ENCOUNTER — Encounter: Payer: Self-pay | Admitting: Primary Care

## 2018-02-28 ENCOUNTER — Ambulatory Visit (INDEPENDENT_AMBULATORY_CARE_PROVIDER_SITE_OTHER): Payer: Medicare Other | Admitting: Primary Care

## 2018-02-28 VITALS — BP 132/64 | HR 94 | Temp 98.2°F | Ht 70.5 in | Wt 200.8 lb

## 2018-02-28 DIAGNOSIS — I2510 Atherosclerotic heart disease of native coronary artery without angina pectoris: Secondary | ICD-10-CM

## 2018-02-28 DIAGNOSIS — G4733 Obstructive sleep apnea (adult) (pediatric): Secondary | ICD-10-CM | POA: Diagnosis not present
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
## 2018-02-28 NOTE — Patient Instructions (Signed)
Good work wearing CPAP mask, aim to wear every night for 4-6 hours or more  Do not drive if experiencing excessive fatigue or somnolence  Ambulatory referral to DME ([facility]) company for CPAP supplies (tubing, mask, chin strap)    CPAP and BPAP Information CPAP and BPAP are methods of helping a person breathe with the use of air pressure. CPAP stands for "continuous positive airway pressure." BPAP stands for "bi-level positive airway pressure." In both methods, air is blown through your nose or mouth and into your air passages to help you breathe well. CPAP and BPAP use different amounts of pressure to blow air. With CPAP, the amount of pressure stays the same while you breathe in and out. With BPAP, the amount of pressure is increased when you breathe in (inhale) so that you can take larger breaths. Your health care provider will recommend whether CPAP or BPAP would be more helpful for you. Why are CPAP and BPAP treatments used? CPAP or BPAP can be helpful if you have:  Sleep apnea.  Chronic obstructive pulmonary disease (COPD).  Heart failure.  Medical conditions that weaken the muscles of the chest including muscular dystrophy, or neurological diseases such as amyotrophic lateral sclerosis (ALS).  Other problems that cause breathing to be weak, abnormal, or difficult. CPAP is most commonly used for obstructive sleep apnea (OSA) to keep the airways from collapsing when the muscles relax during sleep. How is CPAP or BPAP administered? Both CPAP and BPAP are provided by a small machine with a flexible plastic tube that attaches to a plastic mask. You wear the mask. Air is blown through the mask into your nose or mouth. The amount of pressure that is used to blow the air can be adjusted on the machine. Your health care provider will determine the pressure setting that should be used based on your individual needs. When should CPAP or BPAP be used? In most cases, the mask only needs to be  worn during sleep. Generally, the mask needs to be worn throughout the night and during any daytime naps. People with certain medical conditions may also need to wear the mask at other times when they are awake. Follow instructions from your health care provider about when to use the machine. What are some tips for using the mask?   Because the mask needs to be snug, some people feel trapped or closed-in (claustrophobic) when first using the mask. If you feel this way, you may need to get used to the mask. One way to do this is by holding the mask loosely over your nose or mouth and then gradually applying the mask more snugly. You can also gradually increase the amount of time that you use the mask.  Masks are available in various types and sizes. Some fit over your mouth and nose while others fit over just your nose. If your mask does not fit well, talk with your health care provider about getting a different one.  If you are using a mask that fits over your nose and you tend to breathe through your mouth, a chin strap may be applied to help keep your mouth closed.  The CPAP and BPAP machines have alarms that may sound if the mask comes off or develops a leak.  If you have trouble with the mask, it is very important that you talk with your health care provider about finding a way to make the mask easier to tolerate. Do not stop using the mask. Stopping the  use of the mask could have a negative impact on your health. What are some tips for using the machine?  Place your CPAP or BPAP machine on a secure table or stand near an electrical outlet.  Know where the on/off switch is located on the machine.  Follow instructions from your health care provider about how to set the pressure on your machine and when you should use it.  Do not eat or drink while the CPAP or BPAP machine is on. Food or fluids could get pushed into your lungs by the pressure of the CPAP or BPAP.  Do not smoke. Tobacco smoke  residue can damage the machine.  For home use, CPAP and BPAP machines can be rented or purchased through home health care companies. Many different brands of machines are available. Renting a machine before purchasing may help you find out which particular machine works well for you.  Keep the CPAP or BPAP machine and attachments clean. Ask your health care provider for specific instructions. Get help right away if:  You have redness or open areas around your nose or mouth where the mask fits.  You have trouble using the CPAP or BPAP machine.  You cannot tolerate wearing the CPAP or BPAP mask.  You have pain, discomfort, and bloating in your abdomen. Summary  CPAP and BPAP are methods of helping a person breathe with the use of air pressure.  Both CPAP and BPAP are provided by a small machine with a flexible plastic tube that attaches to a plastic mask.  If you have trouble with the mask, it is very important that you talk with your health care provider about finding a way to make the mask easier to tolerate. This information is not intended to replace advice given to you by your health care provider. Make sure you discuss any questions you have with your health care provider. Document Released: 11/15/2003 Document Revised: 10/19/2017 Document Reviewed: 01/06/2016 Elsevier Interactive Patient Education  2019 [employer].

## 2018-02-28 NOTE — Assessment & Plan Note (Addendum)
-   Moderate compliance with CPAP, reports benefit from use - Needs new supplies - Reinforced importance of wearing CPAP every night for an average fo 4-6 hours  - Do not drive if experiencing excessive daytime fatigue or somnolence  - Follow up annually

## 2018-03-03 ENCOUNTER — Ambulatory Visit (INDEPENDENT_AMBULATORY_CARE_PROVIDER_SITE_OTHER): Payer: Medicare Other | Admitting: Internal Medicine

## 2018-03-03 ENCOUNTER — Encounter: Payer: Self-pay | Admitting: Internal Medicine

## 2018-03-03 DIAGNOSIS — I10 Essential (primary) hypertension: Secondary | ICD-10-CM

## 2018-03-03 DIAGNOSIS — E538 Deficiency of other specified B group vitamins: Secondary | ICD-10-CM

## 2018-03-03 DIAGNOSIS — F419 Anxiety disorder, unspecified: Secondary | ICD-10-CM | POA: Diagnosis not present

## 2018-03-03 DIAGNOSIS — E785 Hyperlipidemia, unspecified: Secondary | ICD-10-CM | POA: Diagnosis not present

## 2018-03-03 DIAGNOSIS — I2510 Atherosclerotic heart disease of native coronary artery without angina pectoris: Secondary | ICD-10-CM

## 2018-03-03 MED ORDER — ATORVASTATIN CALCIUM 20 MG PO TABS: 20.0000 mg | ORAL_TABLET | Freq: Every day | ORAL | 3 refills | Status: DC

## 2018-03-03 MED ORDER — ACYCLOVIR 200 MG PO CAPS: 200.0000 mg | ORAL_CAPSULE | Freq: Two times a day (BID) | ORAL | 3 refills | Status: DC

## 2018-03-03 NOTE — Assessment & Plan Note (Signed)
Klonopin prn  Potential benefits of a long term benzodiazepines  use as well as potential risks  and complications were explained to the patient and were aknowledged. 

## 2018-03-03 NOTE — Assessment & Plan Note (Signed)
On Losartan HCT 

## 2018-03-03 NOTE — Assessment & Plan Note (Signed)
Lipitor 

## 2018-03-03 NOTE — Progress Notes (Signed)
Subjective:  [name]: Andrew Mechanic., male    DOB: 1940/10/28  Age: 78 y.o. MRN: [id number]  CC: No chief complaint on file.   HPI Andrew Malone Sr. presents for CAD, dyslipidemia, HTN f/u  Outpatient Medications Prior to Visit  Medication Sig Dispense Refill  . acyclovir (ZOVIRAX) 200 MG capsule Take 1 capsule (200 mg total) by mouth 2 (two) times daily. 60 capsule 3  . ammonium lactate (LAC-HYDRIN) 12 % lotion APPLY  TO THE AFFECTED AREA BID  6  . atorvastatin (LIPITOR) 20 MG tablet Take 1 tablet (20 mg total) by mouth daily. 90 tablet 3  . Cholecalciferol 1000 units tablet Take 1,000 Units by mouth daily.    . citalopram (CELEXA) 10 MG tablet Take 1 tablet (10 mg total) by mouth daily. 90 tablet 3  . clopidogrel (PLAVIX) 75 MG tablet Take 1 tablet (75 mg total) by mouth daily. 90 tablet 1  . Cyanocobalamin (VITAMIN B-12) 1000 MCG SUBL Place 1 tablet (1,000 mcg total) under the tongue daily. 100 tablet 3  . doxycycline (VIBRA-TABS) 100 MG tablet Take 1 tablet (100 mg total) by mouth 2 (two) times daily. 20 tablet 0  . fluticasone (FLONASE) 50 MCG/ACT nasal spray Place 2 sprays into both nostrils daily. 16 g 3  . hydrochlorothiazide (MICROZIDE) 12.5 MG capsule Take 1 capsule (12.5 mg total) by mouth daily. 90 capsule 0  . loratadine (CLARITIN) 10 MG tablet Take 1 tablet (10 mg total) by mouth daily. 100 tablet 3  . losartan (COZAAR) 100 MG tablet Take 1 tablet (100 mg total) by mouth daily. 90 tablet 0  . losartan-hydrochlorothiazide (HYZAAR) 100-12.5 MG tablet TAKE 1 TABLET BY MOUTH DAILY 30 tablet 7  . metFORMIN (GLUCOPHAGE-XR) 500 MG 24 hr tablet Take 500 mg by mouth 2 (two) times daily.  3  . metoprolol succinate (TOPROL-XL) 25 MG 24 hr tablet Take 0.5 tablets (12.5 mg total) by mouth daily. Take at hs 30 tablet 11  . pantoprazole (PROTONIX) 40 MG tablet Take 1 tablet (40 mg total) by mouth daily. 90 tablet 1  . Tamsulosin HCl (FLOMAX) 0.4 MG CAPS Take 1 capsule by mouth  at bedtime.     No facility-administered medications prior to visit.     ROS: Review of Systems  Objective:  BP 122/76 (BP Location: Left Arm, Patient Position: Sitting, Cuff Size: Large)   Pulse 65   Temp 97.9 F (36.6 C) (Oral)   Ht 5' 10.5" (1.791 m)   Wt 200 lb (90.7 kg)   SpO2 99%   BMI 28.29 kg/m   BP Readings from Last 3 Encounters:  03/03/18 122/76  02/28/18 132/64  12/23/17 128/76    Wt Readings from Last 3 Encounters:  03/03/18 200 lb (90.7 kg)  02/28/18 200 lb 12.8 oz (91.1 kg)  12/23/17 198 lb (89.8 kg)    Physical Exam  Lab Results  Component Value Date   WBC 6.0 07/24/2016   HGB 12.2 (L) 07/24/2016   HCT 35.7 (L) 07/24/2016   PLT 219.0 07/24/2016   GLUCOSE 190 (H) 11/12/2017   CHOL 139 04/15/2017   TRIG 157 (H) 04/15/2017   HDL 33 (L) 04/15/2017   LDLDIRECT 51.0 07/24/2016   LDLCALC 75 04/15/2017   ALT 22 07/24/2016   AST 15 07/24/2016   NA 140 11/12/2017   K 4.1 11/12/2017   CL 105 11/12/2017   CREATININE 1.17 11/12/2017   BUN 23 11/12/2017   CO2 26 11/12/2017   TSH  1.880 04/15/2017   PSA 1.75 07/24/2016   INR 0.95 08/05/2011   HGBA1C 7.4 (H) 11/12/2017    Mr Lumbar Spine Wo Contrast  Result Date: 08/31/2017 CLINICAL DATA:  Low back pain radiating to both hips. EXAM: MRI LUMBAR SPINE WITHOUT CONTRAST TECHNIQUE: Multiplanar, multisequence MR imaging of the lumbar spine was performed. No intravenous contrast was administered. COMPARISON:  Lumbar spine radiograph 07/16/2014 FINDINGS: Segmentation: Normal. The lowest disc space is considered to be L5-S1. Alignment: Grade 1 anterolisthesis at L5-S1 secondary to bilateral pars interarticularis defects. Vertebrae: No acute compression fracture, discitis-osteomyelitis of focal marrow lesion. Conus medullaris and cauda equina: The conus medullaris terminates at the L1 level. The cauda equina and conus medullaris are both normal. Paraspinal and other soft tissues: The visualized aorta, IVC and iliac  vessels are normal. The visualized retroperitoneal organs and paraspinal soft tissues are normal. Disc levels: T11-T12 is visualized in the sagittal plane only, with no disc herniation or stenosis. The T12-S1 disc levels are normal. The visualized sacrum is normal. IMPRESSION: 1. Grade 1 L5-S1 anterolisthesis secondary to bilateral pars interarticularis defects, unchanged. 2. No disc herniation, spinal canal stenosis or nerve root impingement. Electronically Signed   By: Ulyses Jarred M.D.   On: 08/31/2017 04:00    Assessment & Plan:   There are no diagnoses linked to this encounter.   No orders of the defined types were placed in this encounter.    Follow-up: No follow-ups on file.  Andrew Kehr, MD

## 2018-03-03 NOTE — Assessment & Plan Note (Signed)
On B12 

## 2018-03-03 NOTE — Assessment & Plan Note (Signed)
Plavix, Losartan HCT, Lipitor 

## 2018-03-04 NOTE — Progress Notes (Signed)
Reviewed and agree with assessment/plan.   Ajaya Crutchfield, MD [facility] 02/26/2016, 12:24 PM Pager:  [phone]  

## 2018-03-31 ENCOUNTER — Other Ambulatory Visit: Payer: Self-pay | Admitting: Internal Medicine
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
## 2018-04-04 DIAGNOSIS — E1142 Type 2 diabetes mellitus with diabetic polyneuropathy: Secondary | ICD-10-CM | POA: Diagnosis not present

## 2018-04-04 DIAGNOSIS — R5383 Other fatigue: Secondary | ICD-10-CM | POA: Diagnosis not present

## 2018-04-04 DIAGNOSIS — I10 Essential (primary) hypertension: Secondary | ICD-10-CM | POA: Diagnosis not present

## 2018-04-04 DIAGNOSIS — Z79899 Other long term (current) drug therapy: Secondary | ICD-10-CM | POA: Diagnosis not present

## 2018-04-04 DIAGNOSIS — E1165 Type 2 diabetes mellitus with hyperglycemia: Secondary | ICD-10-CM | POA: Diagnosis not present

## 2018-04-10 ENCOUNTER — Other Ambulatory Visit: Payer: Self-pay | Admitting: Internal Medicine

## 2018-04-11 ENCOUNTER — Ambulatory Visit (INDEPENDENT_AMBULATORY_CARE_PROVIDER_SITE_OTHER): Payer: Medicare Other | Admitting: Cardiology

## 2018-04-11 ENCOUNTER — Encounter: Payer: Self-pay | Admitting: Cardiology

## 2018-04-11 VITALS — BP 107/61 | HR 75 | Ht 70.5 in | Wt 197.8 lb

## 2018-04-11 DIAGNOSIS — E7800 Pure hypercholesterolemia, unspecified: Secondary | ICD-10-CM | POA: Diagnosis not present

## 2018-04-11 DIAGNOSIS — I2510 Atherosclerotic heart disease of native coronary artery without angina pectoris: Secondary | ICD-10-CM

## 2018-04-11 DIAGNOSIS — I350 Nonrheumatic aortic (valve) stenosis: Secondary | ICD-10-CM | POA: Diagnosis not present

## 2018-04-11 DIAGNOSIS — I10 Essential (primary) hypertension: Secondary | ICD-10-CM | POA: Diagnosis not present

## 2018-04-11 LAB — COMPREHENSIVE METABOLIC PANEL
ALBUMIN: 4.5 g/dL (ref 3.7–4.7)
ALK PHOS: 87 IU/L (ref 39–117)
ALT: 24 IU/L (ref 0–44)
AST: 15 IU/L (ref 0–40)
Albumin/Globulin Ratio: 1.9 (ref 1.2–2.2)
BUN / CREAT RATIO: 16 (ref 10–24)
BUN: 21 mg/dL (ref 8–27)
Bilirubin Total: 0.5 mg/dL (ref 0.0–1.2)
CO2: 23 mmol/L (ref 20–29)
CREATININE: 1.28 mg/dL — AB (ref 0.76–1.27)
Calcium: 10 mg/dL (ref 8.6–10.2)
Chloride: 101 mmol/L (ref 96–106)
GFR calc Af Amer: 63 mL/min/{1.73_m2} (ref >59)
GFR, EST NON AFRICAN AMERICAN: 54 mL/min/{1.73_m2} — AB (ref >59)
GLOBULIN, TOTAL: 2.4 g/dL (ref 1.5–4.5)
Glucose: 150 mg/dL — ABNORMAL HIGH (ref 65–99)
Potassium: 4.3 mmol/L (ref 3.5–5.2)
SODIUM: 139 mmol/L (ref 134–144)
Total Protein: 6.9 g/dL (ref 6.0–8.5)

## 2018-04-11 LAB — LIPID PANEL
CHOLESTEROL TOTAL: 150 mg/dL (ref 100–199)
Chol/HDL Ratio: 4.2 ratio (ref 0.0–5.0)
HDL: 36 mg/dL — AB (ref >39)
LDL Calculated: 87 mg/dL (ref 0–99)
TRIGLYCERIDES: 137 mg/dL (ref 0–149)
VLDL CHOLESTEROL CAL: 27 mg/dL (ref 5–40)

## 2018-04-11 NOTE — Patient Instructions (Addendum)
Medication Instructions:  NO CHANGE If you need a refill on your cardiac medications before your next appointment, please call your pharmacy.   Lab work: Your physician recommends that you HAVE LAB WORK TODAY If you have labs (blood work) drawn today and your tests are completely normal, you will receive your results only by: Marland Kitchen MyChart Message (if you have MyChart) OR . A paper copy in the mail If you have any lab test that is abnormal or we need to change your treatment, we will call you to review the results.  Testing/Procedures: Your physician has requested that you have an echocardiogram. Echocardiography is a painless test that uses sound waves to create images of your heart. It provides your doctor with information about the size and shape of your heart and how well your heart's chambers and valves are working. This procedure takes approximately one hour. There are no restrictions for this procedure.  [facility] has requested that you have a carotid duplex. This test is an ultrasound of the carotid arteries in your neck. It looks at blood flow through these arteries that supply the brain with blood. Allow one hour for this exam. There are no restrictions or special instructions.  SCHEDULE IN June 2020 Follow-Up: At [facility], you and your health needs are our priority.  As part of our continuing mission to provide you with exceptional heart care, we have created designated Provider Care Teams.  These Care Teams include your primary Cardiologist (physician) and Advanced Practice Providers (APPs -  Physician Assistants and Nurse Practitioners) who all work together to provide you with the care you need, when you need it. You will need a follow up appointment in 12 months.  Please call our office 2 months in advance to schedule this appointment.  You may see Kirk Ruths MD or one of the following Advanced Practice Providers on your designated Care Team:    Kerin Ransom, PA-C Roby Lofts, [place] . Sande Rives, PA-C CALL IN December TO SCHEDULE FOLLOW UP IN [place]

## 2018-04-13 ENCOUNTER — Other Ambulatory Visit: Payer: Self-pay | Admitting: *Deleted

## 2018-04-13 ENCOUNTER — Telehealth: Payer: Self-pay | Admitting: *Deleted

## 2018-04-13 DIAGNOSIS — E7800 Pure hypercholesterolemia, unspecified: Secondary | ICD-10-CM

## 2018-04-13 MED ORDER — ATORVASTATIN CALCIUM 40 MG PO TABS: 40.0000 mg | ORAL_TABLET | Freq: Every day | ORAL | 3 refills | Status: DC

## 2018-04-13 NOTE — Telephone Encounter (Signed)
-----   Message from Lelon Perla, MD sent at 04/12/2018  7:11 AM EST ----- Change lipitor to 40 mg daily; lipids and liver 8 weeks Kirk Ruths

## 2018-04-13 NOTE — Telephone Encounter (Signed)
pt aware of results  New script sent to the pharmacy and Lab orders mailed to the pt

## 2018-04-19 ENCOUNTER — Other Ambulatory Visit (HOSPITAL_COMMUNITY): Payer: Medicare Other

## 2018-04-25 ENCOUNTER — Ambulatory Visit (HOSPITAL_COMMUNITY): Payer: Medicare Other | Attending: Cardiology

## 2018-04-25 DIAGNOSIS — I350 Nonrheumatic aortic (valve) stenosis: Secondary | ICD-10-CM

## 2018-05-04 ENCOUNTER — Other Ambulatory Visit: Payer: Self-pay | Admitting: Internal Medicine

## 2018-05-05 DIAGNOSIS — D1801 Hemangioma of skin and subcutaneous tissue: Secondary | ICD-10-CM | POA: Diagnosis not present

## 2018-05-05 DIAGNOSIS — L821 Other seborrheic keratosis: Secondary | ICD-10-CM | POA: Diagnosis not present

## 2018-05-05 DIAGNOSIS — Z85828 Personal history of other malignant neoplasm of skin: Secondary | ICD-10-CM | POA: Diagnosis not present

## 2018-05-05 DIAGNOSIS — D0462 Carcinoma in situ of skin of left upper limb, including shoulder: Secondary | ICD-10-CM | POA: Diagnosis not present

## 2018-05-05 DIAGNOSIS — L570 Actinic keratosis: Secondary | ICD-10-CM | POA: Diagnosis not present

## 2018-05-25 DIAGNOSIS — D0462 Carcinoma in situ of skin of left upper limb, including shoulder: Secondary | ICD-10-CM | POA: Diagnosis not present

## 2018-05-25 DIAGNOSIS — L570 Actinic keratosis: Secondary | ICD-10-CM | POA: Diagnosis not present

## 2018-05-25 DIAGNOSIS — Z85828 Personal history of other malignant neoplasm of skin: Secondary | ICD-10-CM | POA: Diagnosis not present

## 2018-06-02 ENCOUNTER — Ambulatory Visit: Payer: Medicare Other | Admitting: Internal Medicine

## 2018-06-07 ENCOUNTER — Other Ambulatory Visit: Payer: Self-pay | Admitting: Internal Medicine

## 2018-06-16 DIAGNOSIS — E7800 Pure hypercholesterolemia, unspecified: Secondary | ICD-10-CM | POA: Diagnosis not present

## 2018-06-16 LAB — LIPID PANEL
CHOL/HDL RATIO: 4 ratio (ref 0.0–5.0)
CHOLESTEROL TOTAL: 113 mg/dL (ref 100–199)
HDL: 28 mg/dL — AB (ref >39)
LDL Calculated: 60 mg/dL (ref 0–99)
TRIGLYCERIDES: 125 mg/dL (ref 0–149)
VLDL Cholesterol Cal: 25 mg/dL (ref 5–40)

## 2018-06-16 LAB — HEPATIC FUNCTION PANEL
ALT: 23 IU/L (ref 0–44)
AST: 17 IU/L (ref 0–40)
Albumin: 4.3 g/dL (ref 3.7–4.7)
Alkaline Phosphatase: 90 IU/L (ref 39–117)
Bilirubin Total: 0.6 mg/dL (ref 0.0–1.2)
Bilirubin, Direct: 0.17 mg/dL (ref 0.00–0.40)
Total Protein: 6.7 g/dL (ref 6.0–8.5)

## 2018-06-20 ENCOUNTER — Encounter: Payer: Self-pay | Admitting: *Deleted

## 2018-07-07 ENCOUNTER — Ambulatory Visit (INDEPENDENT_AMBULATORY_CARE_PROVIDER_SITE_OTHER): Payer: Medicare Other | Admitting: Internal Medicine

## 2018-07-07 ENCOUNTER — Other Ambulatory Visit: Payer: Self-pay

## 2018-07-07 ENCOUNTER — Encounter: Payer: Self-pay | Admitting: Internal Medicine

## 2018-07-07 DIAGNOSIS — I2510 Atherosclerotic heart disease of native coronary artery without angina pectoris: Secondary | ICD-10-CM | POA: Diagnosis not present

## 2018-07-07 DIAGNOSIS — E1159 Type 2 diabetes mellitus with other circulatory complications: Secondary | ICD-10-CM

## 2018-07-07 DIAGNOSIS — F419 Anxiety disorder, unspecified: Secondary | ICD-10-CM | POA: Diagnosis not present

## 2018-07-07 DIAGNOSIS — I350 Nonrheumatic aortic (valve) stenosis: Secondary | ICD-10-CM

## 2018-07-07 DIAGNOSIS — E785 Hyperlipidemia, unspecified: Secondary | ICD-10-CM

## 2018-07-07 DIAGNOSIS — E538 Deficiency of other specified B group vitamins: Secondary | ICD-10-CM

## 2018-07-07 NOTE — Assessment & Plan Note (Signed)
Lipitor 

## 2018-07-07 NOTE — Assessment & Plan Note (Signed)
Plavix, Losartan HCT, Lipitor

## 2018-07-07 NOTE — Assessment & Plan Note (Signed)
F/u w/cardiology 

## 2018-07-07 NOTE — Assessment & Plan Note (Signed)
Klonopin prn  Potential benefits of a long term benzodiazepines  use as well as potential risks  and complications were explained to the patient and were aknowledged. 

## 2018-07-07 NOTE — Progress Notes (Signed)
Subjective:  [name]: Andrew Malone., male    DOB: Apr 29, 1940  Age: 78 y.o. MRN: [id number]  CC: No chief complaint on file.   HPI Andrew MARK Sr. presents for DM, HTN, dyslipidemia, AS, CAD f/u  Outpatient Medications Prior to Visit  Medication Sig Dispense Refill  . acyclovir (ZOVIRAX) 200 MG capsule Take 1 capsule (200 mg total) by mouth 2 (two) times daily. 180 capsule 3  . ammonium lactate (LAC-HYDRIN) 12 % lotion APPLY  TO THE AFFECTED AREA BID  6  . atorvastatin (LIPITOR) 40 MG tablet Take 1 tablet (40 mg total) by mouth daily. 90 tablet 3  . Cholecalciferol 1000 units tablet Take 1,000 Units by mouth daily.    . citalopram (CELEXA) 10 MG tablet TAKE 1 TABLET(10 MG) BY MOUTH DAILY 90 tablet 3  . clopidogrel (PLAVIX) 75 MG tablet TAKE 1 TABLET(75 MG) BY MOUTH DAILY 90 tablet 3  . Cyanocobalamin (VITAMIN B-12) 1000 MCG SUBL Place 1 tablet (1,000 mcg total) under the tongue daily. 100 tablet 3  . doxycycline (VIBRA-TABS) 100 MG tablet Take 1 tablet (100 mg total) by mouth 2 (two) times daily. 20 tablet 0  . fluticasone (FLONASE) 50 MCG/ACT nasal spray Place 2 sprays into both nostrils daily. 16 g 3  . hydrochlorothiazide (MICROZIDE) 12.5 MG capsule TAKE 1 CAPSULE(12.5 MG) BY MOUTH DAILY 90 capsule 1  . loratadine (CLARITIN) 10 MG tablet Take 1 tablet (10 mg total) by mouth daily. 100 tablet 3  . losartan (COZAAR) 100 MG tablet TAKE 1 TABLET(100 MG) BY MOUTH DAILY 90 tablet 1  . losartan-hydrochlorothiazide (HYZAAR) 100-12.5 MG tablet TAKE 1 TABLET BY MOUTH DAILY 30 tablet 7  . metFORMIN (GLUCOPHAGE-XR) 500 MG 24 hr tablet Take 500 mg by mouth 2 (two) times daily.  3  . metoprolol succinate (TOPROL-XL) 25 MG 24 hr tablet Take 0.5 tablets (12.5 mg total) by mouth daily. Take at hs 30 tablet 11  . pantoprazole (PROTONIX) 40 MG tablet TAKE 1 TABLET(40 MG) BY MOUTH DAILY 90 tablet 1  . Tamsulosin HCl (FLOMAX) 0.4 MG CAPS Take 1 capsule by mouth at bedtime.     No  facility-administered medications prior to visit.     ROS: Review of Systems  Constitutional: Negative for appetite change, fatigue and unexpected weight change.  HENT: Negative for congestion, nosebleeds, sneezing, sore throat and trouble swallowing.   Eyes: Negative for itching and visual disturbance.  Respiratory: Negative for cough.   Cardiovascular: Negative for chest pain, palpitations and leg swelling.  Gastrointestinal: Negative for abdominal distention, blood in stool, diarrhea and nausea.  Genitourinary: Negative for frequency and hematuria.  Musculoskeletal: Positive for back pain. Negative for gait problem, joint swelling and neck pain.  Skin: Negative for rash.  Neurological: Negative for dizziness, tremors, speech difficulty and weakness.  Psychiatric/Behavioral: Negative for agitation, dysphoric mood, sleep disturbance and suicidal ideas. The patient is not nervous/anxious.     Objective:  BP 116/62 (BP Location: Left Arm, Patient Position: Sitting, Cuff Size: Normal)   Pulse 65   Temp 97.6 F (36.4 C) (Oral)   Ht 5' 10.5" (1.791 m)   Wt 200 lb (90.7 kg)   SpO2 99%   BMI 28.29 kg/m   BP Readings from Last 3 Encounters:  07/07/18 116/62  04/11/18 107/61  03/03/18 122/76    Wt Readings from Last 3 Encounters:  07/07/18 200 lb (90.7 kg)  04/11/18 197 lb 12.8 oz (89.7 kg)  03/03/18 200 lb (90.7 kg)  Physical Exam Constitutional:      General: He is not in acute distress.    Appearance: He is well-developed.     Comments: NAD  Eyes:     Conjunctiva/sclera: Conjunctivae normal.     Pupils: Pupils are equal, round, and reactive to light.  Neck:     Musculoskeletal: Normal range of motion.     Thyroid: No thyromegaly.     Vascular: No JVD.  Cardiovascular:     Rate and Rhythm: Normal rate and regular rhythm.     Heart sounds: Murmur present. No friction rub. No gallop.   Pulmonary:     Effort: Pulmonary effort is normal. No respiratory distress.      Breath sounds: Normal breath sounds. No wheezing or rales.  Chest:     Chest wall: No tenderness.  Abdominal:     General: Bowel sounds are normal. There is no distension.     Palpations: Abdomen is soft. There is no mass.     Tenderness: There is no abdominal tenderness. There is no guarding or rebound.  Musculoskeletal: Normal range of motion.        General: No tenderness.  Lymphadenopathy:     Cervical: No cervical adenopathy.  Skin:    General: Skin is warm and dry.     Findings: No rash.  Neurological:     Mental Status: He is alert and oriented to person, place, and time.     Cranial Nerves: No cranial nerve deficit.     Motor: No abnormal muscle tone.     Coordination: Coordination normal.     Gait: Gait normal.     Deep Tendon Reflexes: Reflexes are normal and symmetric.  Psychiatric:        Behavior: Behavior normal.        Thought Content: Thought content normal.        Judgment: Judgment normal.     Lab Results  Component Value Date   WBC 6.0 07/24/2016   HGB 12.2 (L) 07/24/2016   HCT 35.7 (L) 07/24/2016   PLT 219.0 07/24/2016   GLUCOSE 150 (H) 04/11/2018   CHOL 113 06/16/2018   TRIG 125 06/16/2018   HDL 28 (L) 06/16/2018   LDLDIRECT 51.0 07/24/2016   LDLCALC 60 06/16/2018   ALT 23 06/16/2018   AST 17 06/16/2018   NA 139 04/11/2018   K 4.3 04/11/2018   CL 101 04/11/2018   CREATININE 1.28 (H) 04/11/2018   BUN 21 04/11/2018   CO2 23 04/11/2018   TSH 1.880 04/15/2017   PSA 1.75 07/24/2016   INR 0.95 08/05/2011   HGBA1C 7.4 (H) 11/12/2017    Mr Lumbar Spine Wo Contrast  Result Date: 08/31/2017 CLINICAL DATA:  Low back pain radiating to both hips. EXAM: MRI LUMBAR SPINE WITHOUT CONTRAST TECHNIQUE: Multiplanar, multisequence MR imaging of the lumbar spine was performed. No intravenous contrast was administered. COMPARISON:  Lumbar spine radiograph 07/16/2014 FINDINGS: Segmentation: Normal. The lowest disc space is considered to be L5-S1. Alignment: Grade 1  anterolisthesis at L5-S1 secondary to bilateral pars interarticularis defects. Vertebrae: No acute compression fracture, discitis-osteomyelitis of focal marrow lesion. Conus medullaris and cauda equina: The conus medullaris terminates at the L1 level. The cauda equina and conus medullaris are both normal. Paraspinal and other soft tissues: The visualized aorta, IVC and iliac vessels are normal. The visualized retroperitoneal organs and paraspinal soft tissues are normal. Disc levels: T11-T12 is visualized in the sagittal plane only, with no disc herniation or stenosis. The T12-S1  disc levels are normal. The visualized sacrum is normal. IMPRESSION: 1. Grade 1 L5-S1 anterolisthesis secondary to bilateral pars interarticularis defects, unchanged. 2. No disc herniation, spinal canal stenosis or nerve root impingement. Electronically Signed   By: Ulyses Jarred M.D.   On: 08/31/2017 04:00    Assessment & Plan:   There are no diagnoses linked to this encounter.   No orders of the defined types were placed in this encounter.    Follow-up: No follow-ups on file.  Walker Kehr, MD

## 2018-07-07 NOTE — Assessment & Plan Note (Signed)
B 12

## 2018-07-07 NOTE — Assessment & Plan Note (Signed)
Metformin 

## 2018-07-13 ENCOUNTER — Other Ambulatory Visit: Payer: Self-pay

## 2018-07-13 ENCOUNTER — Emergency Department (HOSPITAL_COMMUNITY)
Admission: EM | Admit: 2018-07-13 | Discharge: 2018-07-13 | Disposition: A | Discharge status: Home / Self Care | Payer: Medicare Other | Attending: Emergency Medicine | Admitting: Emergency Medicine

## 2018-07-13 ENCOUNTER — Encounter (HOSPITAL_COMMUNITY): Payer: Self-pay | Admitting: Radiology

## 2018-07-13 ENCOUNTER — Telehealth: Payer: Self-pay | Admitting: Internal Medicine

## 2018-07-13 ENCOUNTER — Ambulatory Visit: Payer: Self-pay | Admitting: Internal Medicine

## 2018-07-13 ENCOUNTER — Emergency Department (HOSPITAL_COMMUNITY): Payer: Medicare Other

## 2018-07-13 DIAGNOSIS — Z87891 Personal history of nicotine dependence: Secondary | ICD-10-CM | POA: Insufficient documentation

## 2018-07-13 DIAGNOSIS — E1151 Type 2 diabetes mellitus with diabetic peripheral angiopathy without gangrene: Secondary | ICD-10-CM | POA: Diagnosis not present

## 2018-07-13 DIAGNOSIS — K921 Melena: Secondary | ICD-10-CM | POA: Diagnosis not present

## 2018-07-13 DIAGNOSIS — R195 Other fecal abnormalities: Secondary | ICD-10-CM | POA: Diagnosis not present

## 2018-07-13 DIAGNOSIS — G40909 Epilepsy, unspecified, not intractable, without status epilepticus: Secondary | ICD-10-CM | POA: Insufficient documentation

## 2018-07-13 DIAGNOSIS — Z20828 Contact with and (suspected) exposure to other viral communicable diseases: Secondary | ICD-10-CM | POA: Diagnosis not present

## 2018-07-13 DIAGNOSIS — Z79899 Other long term (current) drug therapy: Secondary | ICD-10-CM | POA: Insufficient documentation

## 2018-07-13 DIAGNOSIS — Z7984 Long term (current) use of oral hypoglycemic drugs: Secondary | ICD-10-CM | POA: Insufficient documentation

## 2018-07-13 DIAGNOSIS — I10 Essential (primary) hypertension: Secondary | ICD-10-CM | POA: Diagnosis not present

## 2018-07-13 DIAGNOSIS — I2510 Atherosclerotic heart disease of native coronary artery without angina pectoris: Secondary | ICD-10-CM | POA: Diagnosis not present

## 2018-07-13 DIAGNOSIS — R109 Unspecified abdominal pain: Secondary | ICD-10-CM | POA: Diagnosis not present

## 2018-07-13 DIAGNOSIS — Z1159 Encounter for screening for other viral diseases: Secondary | ICD-10-CM | POA: Insufficient documentation

## 2018-07-13 DIAGNOSIS — Z8673 Personal history of transient ischemic attack (TIA), and cerebral infarction without residual deficits: Secondary | ICD-10-CM | POA: Insufficient documentation

## 2018-07-13 LAB — PROTIME-INR
INR: 1 (ref 0.8–1.2)
Prothrombin Time: 13 seconds (ref 11.4–15.2)

## 2018-07-13 LAB — TYPE AND SCREEN
ABO/RH(D): AB POS
Antibody Screen: NEGATIVE

## 2018-07-13 LAB — CBC WITH DIFFERENTIAL/PLATELET
Abs Immature Granulocytes: 0.01 10*3/uL (ref 0.00–0.07)
Basophils Absolute: 0 10*3/uL (ref 0.0–0.1)
Basophils Relative: 1 %
Eosinophils Absolute: 0.1 10*3/uL (ref 0.0–0.5)
Eosinophils Relative: 3 %
HCT: 35.4 % — ABNORMAL LOW (ref 39.0–52.0)
Hemoglobin: 11.7 g/dL — ABNORMAL LOW (ref 13.0–17.0)
Immature Granulocytes: 0 %
Lymphocytes Relative: 23 %
Lymphs Abs: 1.1 10*3/uL (ref 0.7–4.0)
MCH: 29.6 pg (ref 26.0–34.0)
MCHC: 33.1 g/dL (ref 30.0–36.0)
MCV: 89.6 fL (ref 80.0–100.0)
Monocytes Absolute: 0.5 10*3/uL (ref 0.1–1.0)
Monocytes Relative: 10 %
Neutro Abs: 2.8 10*3/uL (ref 1.7–7.7)
Neutrophils Relative %: 63 %
Platelets: 198 10*3/uL (ref 150–400)
RBC: 3.95 MIL/uL — ABNORMAL LOW (ref 4.22–5.81)
RDW: 13 % (ref 11.5–15.5)
WBC: 4.5 10*3/uL (ref 4.0–10.5)
nRBC: 0 % (ref 0.0–0.2)

## 2018-07-13 LAB — COMPREHENSIVE METABOLIC PANEL
ALT: 25 U/L (ref 0–44)
AST: 17 U/L (ref 15–41)
Albumin: 4.4 g/dL (ref 3.5–5.0)
Alkaline Phosphatase: 88 U/L (ref 38–126)
Anion gap: 6 (ref 5–15)
BUN: 24 mg/dL — ABNORMAL HIGH (ref 8–23)
CO2: 27 mmol/L (ref 22–32)
Calcium: 9 mg/dL (ref 8.9–10.3)
Chloride: 104 mmol/L (ref 98–111)
Creatinine, Ser: 1.24 mg/dL (ref 0.61–1.24)
GFR calc Af Amer: >60 mL/min (ref >60)
GFR calc non Af Amer: 57 mL/min — ABNORMAL LOW (ref >60)
Glucose, Bld: 213 mg/dL — ABNORMAL HIGH (ref 70–99)
Potassium: 3.4 mmol/L — ABNORMAL LOW (ref 3.5–5.1)
Sodium: 137 mmol/L (ref 135–145)
Total Bilirubin: 0.8 mg/dL (ref 0.3–1.2)
Total Protein: 7.3 g/dL (ref 6.5–8.1)

## 2018-07-13 LAB — ABO/RH: ABO/RH(D): AB POS

## 2018-07-13 LAB — POC OCCULT BLOOD, ED
Fecal Occult Bld: NEGATIVE
Fecal Occult Bld: NEGATIVE

## 2018-07-13 LAB — SARS CORONAVIRUS 2 BY RT PCR (HOSPITAL ORDER, PERFORMED IN ~~LOC~~ HOSPITAL LAB): SARS Coronavirus 2: NEGATIVE

## 2018-07-13 MED ORDER — SODIUM CHLORIDE 0.9 % IV SOLN
80.0000 mg | Freq: Once | INTRAVENOUS | Status: AC
  Administered 2018-07-13: 80 mg via INTRAVENOUS
  Filled 2018-07-13: qty 80

## 2018-07-13 MED ORDER — SODIUM CHLORIDE 0.9 % IV BOLUS
1000.0000 mL | Freq: Once | INTRAVENOUS | Status: AC
  Administered 2018-07-13: 1000 mL via INTRAVENOUS

## 2018-07-13 MED ORDER — SODIUM CHLORIDE 0.9 % IV SOLN
INTRAVENOUS | Status: DC
  Administered 2018-07-13: 15:00:00 via INTRAVENOUS

## 2018-07-13 MED ORDER — SODIUM CHLORIDE (PF) 0.9 % IJ SOLN
INTRAMUSCULAR | Status: AC
  Filled 2018-07-13: qty 50

## 2018-07-13 MED ORDER — IOHEXOL 300 MG/ML  SOLN
100.0000 mL | Freq: Once | INTRAMUSCULAR | Status: AC | PRN
  Administered 2018-07-13: 15:00:00 100 mL via INTRAVENOUS

## 2018-07-13 MED ORDER — SODIUM CHLORIDE 0.9 % IV SOLN
8.0000 mg/h | INTRAVENOUS | Status: DC
  Administered 2018-07-13: 8 mg/h via INTRAVENOUS
  Filled 2018-07-13: qty 80

## 2018-07-13 NOTE — ED Triage Notes (Signed)
Pt reports he has been having dark tarry stool. Pt reports some abdominal pain. Pt reports he took pepto bismol 3 days ago. Pt denies use of good powder.  Pt reports his stool is "coffee ground" in nature.

## 2018-07-13 NOTE — ED Notes (Signed)
Pt ambulatory to bathroom, informed of need for stool sample.

## 2018-07-13 NOTE — Telephone Encounter (Signed)
1.  If his stools are bloody, frank melena (tarry), or symptoms such as dizziness, shortness of breath, or abdominal pain, he needs to go to the ER (this would be the recommendation with or without COVID-19 issue). 2.  If none of the above, and have him come to our laboratory today for CBC and basic metabolic panel.  Also, schedule him for in person office visit with me tomorrow at 11:30 AM.  Please alert the relevant front desk staff and nurses so that they are prepared.  Let him know that his wife may not come in to the office.  I will copy Sheri on this correspondence.  Thank you Vaughan Basta Dr. Henrene Pastor

## 2018-07-13 NOTE — Telephone Encounter (Signed)
Wife Andrew Malone called in with him in the background on the toilet. He is having stools with coffee grounds that are dark that began yesterday (Tues).  See triage notes.  Hassan Rowan informed me she had tried to call Dr. Blanch Media office (GI) however the phone was diverting her to the hospital to inquire about a pt.  I called Dr. Judeen Hammans office because the pt did not want to go to the ED.   (COVID-19 pandemic)    Dr. Judeen Hammans flow coordinator Lovena Le instructed pt to go to the ED also.   Dr. Alain Marion is not in this week. I let Hassan Rowan know however she is not wanting to go to the ED.    I put her on hold and called Lovena Le back and let her know the situation with not getting a hold of Dr. Henrene Pastor does she have another way to get through to their office.    She tried a back door number however we Hassan Rowan reconnected at this point) got a recording so I hung up with Dr. Blanch Media office.  Hassan Rowan was becoming frustrated with it being so difficult to get a doctor.   "My husband could just sit here and bleed to death".   I let her know I would do my best to get them some help.      I called  back and let the person that answered the phone know we got a recording when Lovena Le  transferred me to Dr. Blanch Media office and the wife is becoming frustrated.    She was able to get someone on the line from Dr. Blanch Media office.  I let Hassan Rowan know I had someone on the line from Dr. Blanch Media office that is going to speak with her.   I let Hassan Rowan know that if we got disconnected that Dr. Blanch Media office would call her right back.   I had confirmed the phone number.  Hassan Rowan asked what my name was again and I told her.   She thanked me for helping her with this situation.   Hassan Rowan was fine with Dr. Blanch Media office calling her back.    We did get disconnected when I tried to join the calls together.   However Dr. Blanch Media office will call her back.   Reason for Disposition . Tarry or jet black-colored stool (not dark  green)  Answer Assessment - Initial Assessment Questions 1. APPEARANCE of BLOOD: "What color is it?" "Is it passed separately, on the surface of the stool, or mixed in with the stool?"      Saturday started having diarrhea that is dark.   This morning early my stools are looking like coffee grounds in it.     He has had polps and diverticulitis.    Wife is calling in because he is on the toilet. I tried to call the GI dr   Dr. Henrene Pastor but can't get through.   2. AMOUNT: "How much blood was passed?"      Maybe 8-10 times.   He is taking Milk of Magnesia.   He gets diarrhea but not like today. 3. FREQUENCY: "How many times has blood been passed with the stools?"      See above. 4. ONSET: "When was the blood first seen in the stools?" (Days or weeks)      Yesterday morning. 5. DIARRHEA: "Is there also some diarrhea?" If so, ask: "How many diarrhea stools were passed in past 24 hours?"      Yes.   Going  every hour or 2 to the bathroom.  It's only a little at the time.  It started being runny and soupy. 6. CONSTIPATION: "Do you have constipation?" If so, "How bad is it?"     No 7. RECURRENT SYMPTOMS: "Have you had blood in your stools before?" If so, ask: "When was the last time?" and "What happened that time?"      No 8. BLOOD THINNERS: "Do you take any blood thinners?" (e.g., Coumadin/warfarin, Pradaxa/dabigatran, aspirin)     Plavix   Had 3 strokes and a stent in his neck.    9. OTHER SYMPTOMS: "Do you have any other symptoms?"  (e.g., abdominal pain, vomiting, dizziness, fever)     No 10. PREGNANCY: "Is there any chance you are pregnant?" "When was your last menstrual period?"       N/A  Protocols used: RECTAL BLEEDING-A-AH
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
## 2018-07-13 NOTE — Telephone Encounter (Signed)
Spoke with pts wife and pt. Reports stool is dark black tarry stool and pt has had some dizziness and feels weak. Discussed with pt that per Dr. Blanch Media recommendation he should proceed to [facility] ER. Wife states they will go, she wanted to make sure ER knows they are coming. Called Triage nurse and let her know pt is on the way. Dr. Henrene Pastor aware.

## 2018-07-13 NOTE — Telephone Encounter (Signed)
Pt's PCP called to schedule pt urgently for GI bleed--pt's wife reported that every BM pt has had looks like coffee grounds.  Earliest available appt with Dr. Henrene Pastor is June 2.  Can we get him in sooner?

## 2018-07-13 NOTE — ED Notes (Signed)
Bed: YD28 Expected date:  Expected time:  Means of arrival:  Comments: POV GI Bleed

## 2018-07-13 NOTE — Telephone Encounter (Signed)
Pts wife calling stating  Saturday pt started having diarrhea that is dark.   This morning stools are looking like coffee grounds in it.     He has had polyps and diverticulitis and is also taking Plavix. Pts wife called PCP this am because they were not able to get through to our office and was told to go to the ER. Wife and pt do not want to go to ER due to covid. Requesting to be seen. Please advise.

## 2018-07-18 ENCOUNTER — Encounter: Payer: Self-pay | Admitting: General Surgery

## 2018-07-19 ENCOUNTER — Ambulatory Visit (INDEPENDENT_AMBULATORY_CARE_PROVIDER_SITE_OTHER): Payer: Medicare Other | Admitting: Internal Medicine

## 2018-07-19 ENCOUNTER — Other Ambulatory Visit: Payer: Self-pay

## 2018-07-19 ENCOUNTER — Telehealth: Payer: Self-pay

## 2018-07-19 ENCOUNTER — Encounter: Payer: Self-pay | Admitting: Internal Medicine

## 2018-07-19 DIAGNOSIS — Z7902 Long term (current) use of antithrombotics/antiplatelets: Secondary | ICD-10-CM

## 2018-07-19 DIAGNOSIS — K219 Gastro-esophageal reflux disease without esophagitis: Secondary | ICD-10-CM | POA: Diagnosis not present

## 2018-07-19 DIAGNOSIS — R195 Other fecal abnormalities: Secondary | ICD-10-CM | POA: Diagnosis not present

## 2018-07-19 DIAGNOSIS — R143 Flatulence: Secondary | ICD-10-CM

## 2018-07-19 DIAGNOSIS — I2510 Atherosclerotic heart disease of native coronary artery without angina pectoris: Secondary | ICD-10-CM

## 2018-07-19 NOTE — Patient Instructions (Signed)
1.  Schedule upper endoscopy to further evaluate symptoms.   2.  STAY ON PLAVIX for his examination 3.  Continue pantoprazole 40 mg daily 4.  Further recommendations after the above completed 5.  Colon cancer surveillance up-to-date

## 2018-07-19 NOTE — Telephone Encounter (Signed)
Spoke with patient, scheduled his endoscopy, and mailed his instructions.
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
## 2018-07-26 NOTE — Addendum Note (Signed)
Addended by: Audrea Muscat on: 07/26/2018 12:18 PM   Modules accepted: Orders

## 2018-08-02 ENCOUNTER — Telehealth: Payer: Self-pay | Admitting: *Deleted

## 2018-08-02 NOTE — Telephone Encounter (Signed)
Left message for first covid screen attempt. SM

## 2018-08-03 ENCOUNTER — Telehealth: Payer: Self-pay | Admitting: *Deleted

## 2018-08-03 NOTE — Telephone Encounter (Signed)
Covid-19 screening questions  Have you traveled in the last 14 days? no If yes where?  Do you now or have you had a fever in the last 14 days? no  Do you have any respiratory symptoms of shortness of breath or cough now or in the last 14 days? no  Do you have any family members or close contacts with diagnosed or suspected Covid-19 in the past 14 days?no  Have you been tested for Covid-19 and found to be positive? No  Pt tested negative for covid 19 on 07-13-18  Spoke with wife-  Pt is aware that care partner will wait in the car during parking lot; if they feel like they will be too hot to wait in the car; they may wait in the lobby.  We want them to wear a mask (we do not have any that we can provide them), practice social distancing, and we will check their temperatures when they get here.  I did remind patient that their care partner needs to stay in the parking lot the entire time. Pt will wear mask into building

## 2018-08-04 ENCOUNTER — Ambulatory Visit (AMBULATORY_SURGERY_CENTER): Payer: Medicare Other | Admitting: Internal Medicine

## 2018-08-04 ENCOUNTER — Other Ambulatory Visit: Payer: Self-pay

## 2018-08-04 ENCOUNTER — Encounter: Payer: Self-pay | Admitting: Internal Medicine

## 2018-08-04 VITALS — BP 133/51 | HR 64 | Temp 98.6°F | Resp 14 | Ht 70.0 in | Wt 200.0 lb

## 2018-08-04 DIAGNOSIS — K317 Polyp of stomach and duodenum: Secondary | ICD-10-CM

## 2018-08-04 DIAGNOSIS — K449 Diaphragmatic hernia without obstruction or gangrene: Secondary | ICD-10-CM | POA: Diagnosis not present

## 2018-08-04 DIAGNOSIS — K219 Gastro-esophageal reflux disease without esophagitis: Secondary | ICD-10-CM

## 2018-08-04 DIAGNOSIS — R195 Other fecal abnormalities: Secondary | ICD-10-CM

## 2018-08-04 MED ORDER — SODIUM CHLORIDE 0.9 % IV SOLN: 500.0000 mL | Freq: Once | INTRAVENOUS | Status: DC

## 2018-08-04 NOTE — Progress Notes (Signed)
PT taken to PACU. Monitors in place. VSS. Report given to RN. 

## 2018-08-04 NOTE — Patient Instructions (Signed)
YOU HAD AN ENDOSCOPIC PROCEDURE TODAY AT [facility]:   Refer to the procedure report that was given to you for any specific questions about what was found during the examination.  If the procedure report does not answer your questions, please call your gastroenterologist to clarify.  If you requested that your care partner not be given the details of your procedure findings, then the procedure report has been included in a sealed envelope for you to review at your convenience later.  YOU SHOULD EXPECT: Some feelings of bloating in the abdomen. Passage of more gas than usual.  Walking can help get rid of the air that was put into your GI tract during the procedure and reduce the bloating. If you had a lower endoscopy (such as a colonoscopy or flexible sigmoidoscopy) you may notice spotting of blood in your stool or on the toilet paper. If you underwent a bowel prep for your procedure, you may not have a normal bowel movement for a few days.  Please Note:  You might notice some irritation and congestion in your nose or some drainage.  This is from the oxygen used during your procedure.  There is no need for concern and it should clear up in a day or so.  SYMPTOMS TO REPORT IMMEDIATELY:   Following upper endoscopy (EGD)  Vomiting of blood or coffee ground material  New chest pain or pain under the shoulder blades  Painful or persistently difficult swallowing  New shortness of breath  Fever of 100F or higher  Black, tarry-looking stools  For urgent or emergent issues, a gastroenterologist can be reached at any hour by calling [phone].   DIET:  We do recommend a small meal at first, but then you may proceed to your regular diet.  Drink plenty of fluids but you should avoid alcoholic beverages for 24 hours.  ACTIVITY:  You should plan to take it easy for the rest of today and you should NOT DRIVE or use heavy machinery until tomorrow (because of the sedation medicines used  during the test).    FOLLOW UP: Our staff will call the number listed on your records 48-72 hours following your procedure to check on you and address any questions or concerns that you may have regarding the information given to you following your procedure. If we do not reach you, we will leave a message.  We will attempt to reach you two times.  During this call, we will ask if you have developed any symptoms of COVID 19. If you develop any symptoms (ie: fever, flu-like symptoms, shortness of breath, cough etc.) before then, please call [phone].  If you test positive for Covid 19 in the 2 weeks post procedure, please call and report this information to [place].    If any biopsies were taken you will be contacted by phone or by letter within the next 1-3 weeks.  Please call us at [phone] if you have not heard about the biopsies in 3 weeks.   Presume previous medications including Plavix Follow up with Dr. Henrene Pastor as needed  SIGNATURES/CONFIDENTIALITY: You and/or your care partner have signed paperwork which will be entered into your electronic medical record.  These signatures attest to the fact that that the information above on your After Visit Summary has been reviewed and is understood.  Full responsibility of the confidentiality of this discharge information lies with you and/or your care-partner.

## 2018-08-04 NOTE — Op Note (Signed)
[facility] Patient Name: Andrew Malone Procedure Date: 08/04/2018 9:19 AM MRN: [id number] Endoscopist: Docia Chuck. Henrene Pastor , MD Age: 78 Referring MD:  Date of Birth: [birth year] Gender: Male Account #: [id number] Procedure:                Upper GI endoscopy Indications:              Melena "dark stools". Negative work-up in ER Medicines:                Monitored Anesthesia Care Procedure:                Pre-Anesthesia Assessment:                           - Prior to the procedure, a History and Physical                            was performed, and patient medications and                            allergies were reviewed. The patient's tolerance of                            previous anesthesia was also reviewed. The risks                            and benefits of the procedure and the sedation                            options and risks were discussed with the patient.                            All questions were answered, and informed consent                            was obtained. Prior Anticoagulants: The patient has                            taken Plavix (clopidogrel), last dose was day of                            procedure. ASA Grade Assessment: III - A patient                            with severe systemic disease. After reviewing the                            risks and benefits, the patient was deemed in                            satisfactory condition to undergo the procedure.                           After obtaining informed consent, the endoscope was  passed under direct vision. Throughout the                            procedure, the patient's blood pressure, pulse, and                            oxygen saturations were monitored continuously. The                            Endoscope was introduced through the mouth, and                            advanced to the third part of duodenum. The upper                            GI  endoscopy was accomplished without difficulty.                            The patient tolerated the procedure well. Scope In: Scope Out: Findings:                 The esophagus was normal.                           The stomach was normal except for a few small                            benign fundic gland type polyps and a sliding                            hiatal hernia without erosions.                           The examined duodenum was normal.                           The cardia and gastric fundus were normal on                            retroflexion. Complications:            No immediate complications. Estimated Blood Loss:     Estimated blood loss: none. Impression:               1. Essentially normal EGD                           2. GERD                           3. No evidence for GI bleeding. Recommendation:           - Patient has a contact number available for                            emergencies. The signs and symptoms of potential  delayed complications were discussed with the                            patient. Return to normal activities tomorrow.                            Written discharge instructions were provided to the                            patient.                           - Resume previous diet.                           - Continue present medications, including Plavix.                           - Follow-up with Dr. Henrene Pastor as needed Docia Chuck. Henrene Pastor, MD 08/04/2018 9:31:36 AM This report has been signed electronically.

## 2018-08-04 NOTE — Progress Notes (Signed)
[place] Temp JB Vital signs

## 2018-08-08 ENCOUNTER — Telehealth: Payer: Self-pay

## 2018-08-08 NOTE — Telephone Encounter (Signed)
1. Have you developed a fever since your procedure? no  2.   Have you had an respiratory symptoms (SOB or cough) since your procedure? no  3.   Have you tested positive for COVID 19 since your procedure no  4.   Have you had any family members/close contacts diagnosed with the COVID 19 since your procedure?  no   If yes to any of these questions please route to Joylene John, RN and Alphonsa Gin, Therapist, sports.

## 2018-08-08 NOTE — Telephone Encounter (Signed)
  Follow up Call-  Call back number 08/04/2018 06/03/2016  Post procedure Call Back phone  # [phone]  Permission to leave phone message Yes Yes  Some recent data might be hidden     Patient questions:  Do you have a fever, pain , or abdominal swelling? No. Pain Score  0 *  Have you tolerated food without any problems? No.  Have you been able to return to your normal activities? No.  Do you have any questions about your discharge instructions: Diet   No. Medications  No. Follow up visit  No.  Do you have questions or concerns about your Care? Yes.    Actions: * If pain score is 4 or above: No action needed, pain <4.  Pt reported he is still having 5-6 brown, liquid stools per day.  He said he has always had loose BM, but nothing like this.  Asked what he should do.  Please advise.

## 2018-08-08 NOTE — Telephone Encounter (Signed)
Try low-dose Imodium.  Okay to take 1 or 2 daily as needed for now

## 2018-08-08 NOTE — Telephone Encounter (Signed)
Message from Dr. Henrene Pastor given to Pt's wife to try low - dose imodium 1-2 daily as needed for now.  To call back if sx are not improving.

## 2018-08-10 ENCOUNTER — Ambulatory Visit (HOSPITAL_COMMUNITY)
Admission: RE | Admit: 2018-08-10 | Discharge: 2018-08-10 | Disposition: A | Discharge status: Home / Self Care | Payer: Medicare Other | Source: Ambulatory Visit | Attending: Cardiology | Admitting: Cardiology

## 2018-08-10 ENCOUNTER — Other Ambulatory Visit: Payer: Self-pay

## 2018-08-10 ENCOUNTER — Other Ambulatory Visit (HOSPITAL_COMMUNITY): Payer: Self-pay | Admitting: Cardiology

## 2018-08-10 DIAGNOSIS — I679 Cerebrovascular disease, unspecified: Secondary | ICD-10-CM | POA: Insufficient documentation

## 2018-08-10 DIAGNOSIS — I6523 Occlusion and stenosis of bilateral carotid arteries: Secondary | ICD-10-CM

## 2018-08-12 ENCOUNTER — Other Ambulatory Visit: Payer: Self-pay | Admitting: *Deleted

## 2018-08-12 DIAGNOSIS — I679 Cerebrovascular disease, unspecified: Secondary | ICD-10-CM

## 2018-09-08 ENCOUNTER — Telehealth: Payer: Self-pay | Admitting: Internal Medicine

## 2018-09-08 DIAGNOSIS — R195 Other fecal abnormalities: Secondary | ICD-10-CM

## 2018-09-08 MED ORDER — METRONIDAZOLE 250 MG PO TABS: 250.0000 mg | ORAL_TABLET | Freq: Three times a day (TID) | ORAL | 0 refills | Status: DC

## 2018-09-08 NOTE — Telephone Encounter (Signed)
Pt had EGD done in early June, he was passing what looked like coffee grounds as stool. Wife states since then he has not passed anything like that again but now he is having multiple watery stools. States since 3:30 this am he has already had 6-7 "muddy water" stools. Reports he has to get up 4-5 times/night and all he passes is water, sometimes lots of gas. He tried some pepto but did not do this long as it makes his stool black. States he goes to the bathroom over 10 times a day. Please advise.

## 2018-09-08 NOTE — Telephone Encounter (Signed)
Patient wife called said that the patient is still having issues with going to the bathroom even after the procedure. Would like to speak to someone.

## 2018-09-08 NOTE — Telephone Encounter (Signed)
Spoke with pts wife and she is aware. Order in epic for lab and script sent to pharmacy.

## 2018-09-08 NOTE — Telephone Encounter (Signed)
1.  Check stool for C. difficile 2.  Start metronidazole 250 mg 3 times daily for 10 days (if no allergy or contraindication) 3.  Okay to use over-the-counter Imodium as directed

## 2018-09-14 ENCOUNTER — Other Ambulatory Visit: Payer: Medicare Other

## 2018-09-15 ENCOUNTER — Other Ambulatory Visit: Payer: Medicare Other

## 2018-09-15 DIAGNOSIS — R195 Other fecal abnormalities: Secondary | ICD-10-CM | POA: Diagnosis not present

## 2018-09-16 LAB — CLOSTRIDIUM DIFFICILE BY PCR: Toxigenic C. Difficile by PCR: NEGATIVE

## 2018-10-07 ENCOUNTER — Other Ambulatory Visit: Payer: Self-pay | Admitting: Internal Medicine

## 2018-10-18 ENCOUNTER — Encounter: Payer: Self-pay | Admitting: Internal Medicine

## 2018-10-18 ENCOUNTER — Other Ambulatory Visit: Payer: Self-pay

## 2018-10-18 ENCOUNTER — Ambulatory Visit (INDEPENDENT_AMBULATORY_CARE_PROVIDER_SITE_OTHER): Payer: Medicare Other | Admitting: Internal Medicine

## 2018-10-18 ENCOUNTER — Other Ambulatory Visit (INDEPENDENT_AMBULATORY_CARE_PROVIDER_SITE_OTHER): Payer: Medicare Other

## 2018-10-18 VITALS — BP 140/70 | HR 71 | Temp 98.2°F | Ht 70.0 in | Wt 199.0 lb

## 2018-10-18 DIAGNOSIS — E1159 Type 2 diabetes mellitus with other circulatory complications: Secondary | ICD-10-CM

## 2018-10-18 DIAGNOSIS — F419 Anxiety disorder, unspecified: Secondary | ICD-10-CM

## 2018-10-18 DIAGNOSIS — E538 Deficiency of other specified B group vitamins: Secondary | ICD-10-CM | POA: Diagnosis not present

## 2018-10-18 DIAGNOSIS — I6523 Occlusion and stenosis of bilateral carotid arteries: Secondary | ICD-10-CM

## 2018-10-18 DIAGNOSIS — I2510 Atherosclerotic heart disease of native coronary artery without angina pectoris: Secondary | ICD-10-CM

## 2018-10-18 DIAGNOSIS — E559 Vitamin D deficiency, unspecified: Secondary | ICD-10-CM

## 2018-10-18 DIAGNOSIS — I10 Essential (primary) hypertension: Secondary | ICD-10-CM | POA: Diagnosis not present

## 2018-10-18 LAB — BASIC METABOLIC PANEL
BUN: 27 mg/dL — ABNORMAL HIGH (ref 6–23)
CO2: 29 mEq/L (ref 19–32)
Calcium: 9.5 mg/dL (ref 8.4–10.5)
Chloride: 102 mEq/L (ref 96–112)
Creatinine, Ser: 1.45 mg/dL (ref 0.40–1.50)
GFR: 47.31 mL/min — ABNORMAL LOW (ref >60.00)
Glucose, Bld: 131 mg/dL — ABNORMAL HIGH (ref 70–99)
Potassium: 4.9 mEq/L (ref 3.5–5.1)
Sodium: 136 mEq/L (ref 135–145)

## 2018-10-18 LAB — VITAMIN D 25 HYDROXY (VIT D DEFICIENCY, FRACTURES): VITD: 41.04 ng/mL (ref 30.00–100.00)

## 2018-10-18 LAB — VITAMIN B12: Vitamin B-12: 1362 pg/mL — ABNORMAL HIGH (ref 211–911)

## 2018-10-18 LAB — HEMOGLOBIN A1C: Hgb A1c MFr Bld: 7.7 % — ABNORMAL HIGH (ref 4.6–6.5)

## 2018-10-18 MED ORDER — METOPROLOL SUCCINATE ER 25 MG PO TB24: 12.5000 mg | ORAL_TABLET | Freq: Every day | ORAL | 1 refills | Status: DC

## 2018-10-18 MED ORDER — PANTOPRAZOLE SODIUM 40 MG PO TBEC: DELAYED_RELEASE_TABLET | ORAL | 3 refills | Status: DC

## 2018-10-18 NOTE — Assessment & Plan Note (Signed)
Klonopin prn  Potential benefits of a long term benzodiazepines  use as well as potential risks  and complications were explained to the patient and were aknowledged. 

## 2018-10-18 NOTE — Assessment & Plan Note (Signed)
Plavix, Losartan HCT, Lipitor

## 2018-10-18 NOTE — Progress Notes (Signed)
Subjective:  [name]: [name], male    DOB: 1940/08/04  Age: 78 y.o. MRN: [id number]  CC: No chief complaint on file.   HPI Andrew SALWAY Sr. presents for CAD, HTN, OA, GERD   Outpatient Medications Prior to Visit  Medication Sig Dispense Refill   acyclovir (ZOVIRAX) 200 MG capsule Take 1 capsule (200 mg total) by mouth 2 (two) times daily. (Patient taking differently: Take 200 mg by mouth 2 (two) times daily as needed (outbreaks of coldsores). ) 180 capsule 3   ammonium lactate (LAC-HYDRIN) 12 % lotion Apply 1 application topically 2 (two) times daily as needed for dry skin.   6   atorvastatin (LIPITOR) 40 MG tablet Take 1 tablet (40 mg total) by mouth daily. (Patient taking differently: Take 40 mg by mouth at bedtime. ) 90 tablet 3   bismuth subsalicylate (PEPTO BISMOL) 262 MG/15ML suspension Take 30 mLs by mouth every 6 (six) hours as needed for indigestion or diarrhea or loose stools.     Cholecalciferol 1000 units tablet Take 1,000 Units by mouth daily.     citalopram (CELEXA) 10 MG tablet TAKE 1 TABLET(10 MG) BY MOUTH DAILY (Patient taking differently: Take 10 mg by mouth at bedtime. ) 90 tablet 3   clopidogrel (PLAVIX) 75 MG tablet TAKE 1 TABLET(75 MG) BY MOUTH DAILY (Patient taking differently: Take 75 mg by mouth daily. ) 90 tablet 3   Cyanocobalamin (VITAMIN B-12) 1000 MCG SUBL Place 1 tablet (1,000 mcg total) under the tongue daily. 100 tablet 3   hydrochlorothiazide (MICROZIDE) 12.5 MG capsule TAKE 1 CAPSULE(12.5 MG) BY MOUTH DAILY 90 capsule 1   loratadine (CLARITIN) 10 MG tablet Take 10 mg by mouth at bedtime.     losartan (COZAAR) 100 MG tablet TAKE 1 TABLET(100 MG) BY MOUTH DAILY 90 tablet 1   metFORMIN (GLUCOPHAGE-XR) 500 MG 24 hr tablet Take 1,000 mg by mouth daily with supper.   3   metoprolol succinate (TOPROL-XL) 25 MG 24 hr tablet Take 0.5 tablets (12.5 mg total) by mouth daily. Take at hs (Patient taking differently: Take 12.5 mg by mouth  at bedtime. ) 30 tablet 11   metroNIDAZOLE (FLAGYL) 250 MG tablet Take 1 tablet (250 mg total) by mouth 3 (three) times daily. 30 tablet 0   pantoprazole (PROTONIX) 40 MG tablet TAKE 1 TABLET(40 MG) BY MOUTH DAILY (Patient taking differently: 40 mg daily before breakfast. ) 90 tablet 1   Tamsulosin HCl (FLOMAX) 0.4 MG CAPS Take 0.4 mg by mouth at bedtime.      Facility-Administered Medications Prior to Visit  Medication Dose Route Frequency Provider Last Rate Last Dose   0.9 %  sodium chloride infusion  500 mL Intravenous Once Irene Shipper, MD        ROS: Review of Systems  Constitutional: Positive for fatigue. Negative for appetite change and unexpected weight change.  HENT: Negative for congestion, nosebleeds, sneezing, sore throat and trouble swallowing.   Eyes: Negative for itching and visual disturbance.  Respiratory: Negative for cough.   Cardiovascular: Negative for chest pain, palpitations and leg swelling.  Gastrointestinal: Negative for abdominal distention, blood in stool, diarrhea and nausea.  Genitourinary: Negative for frequency and hematuria.  Musculoskeletal: Positive for arthralgias. Negative for back pain, gait problem, joint swelling and neck pain.  Skin: Negative for rash.  Neurological: Negative for dizziness, tremors, speech difficulty and weakness.  Psychiatric/Behavioral: Negative for agitation, dysphoric mood and sleep disturbance. The patient is not nervous/anxious.  Objective:  BP 140/70 (BP Location: Left Arm, Patient Position: Sitting, Cuff Size: Normal)    Pulse 71    Temp 98.2 F (36.8 C) (Oral)    Ht 5\' 10"  (1.778 m)    Wt 199 lb (90.3 kg)    SpO2 93%    BMI 28.55 kg/m   BP Readings from Last 3 Encounters:  10/18/18 140/70  08/04/18 (!) 133/51  07/13/18 139/64    Wt Readings from Last 3 Encounters:  10/18/18 199 lb (90.3 kg)  08/04/18 200 lb (90.7 kg)  07/18/18 210 lb (95.3 kg)    Physical Exam Constitutional:      General: He is not  in acute distress.    Appearance: He is well-developed.     Comments: NAD  Eyes:     Conjunctiva/sclera: Conjunctivae normal.     Pupils: Pupils are equal, round, and reactive to light.  Neck:     Musculoskeletal: Normal range of motion.     Thyroid: No thyromegaly.     Vascular: No JVD.  Cardiovascular:     Rate and Rhythm: Normal rate and regular rhythm.     Heart sounds: Normal heart sounds. No murmur. No friction rub. No gallop.   Pulmonary:     Effort: Pulmonary effort is normal. No respiratory distress.     Breath sounds: Normal breath sounds. No wheezing or rales.  Chest:     Chest wall: No tenderness.  Abdominal:     General: Bowel sounds are normal. There is no distension.     Palpations: Abdomen is soft. There is no mass.     Tenderness: There is no abdominal tenderness. There is no guarding or rebound.  Musculoskeletal: Normal range of motion.        General: Tenderness present.  Lymphadenopathy:     Cervical: No cervical adenopathy.  Skin:    General: Skin is warm and dry.     Findings: No rash.  Neurological:     Mental Status: He is alert and oriented to person, place, and time.     Cranial Nerves: No cranial nerve deficit.     Motor: No abnormal muscle tone.     Coordination: Coordination normal.     Gait: Gait normal.     Deep Tendon Reflexes: Reflexes are normal and symmetric.  Psychiatric:        Behavior: Behavior normal.        Thought Content: Thought content normal.        Judgment: Judgment normal.     Lab Results  Component Value Date   WBC 4.5 07/13/2018   HGB 11.7 (L) 07/13/2018   HCT 35.4 (L) 07/13/2018   PLT 198 07/13/2018   GLUCOSE 213 (H) 07/13/2018   CHOL 113 06/16/2018   TRIG 125 06/16/2018   HDL 28 (L) 06/16/2018   LDLDIRECT 51.0 07/24/2016   LDLCALC 60 06/16/2018   ALT 25 07/13/2018   AST 17 07/13/2018   NA 137 07/13/2018   K 3.4 (L) 07/13/2018   CL 104 07/13/2018   CREATININE 1.24 07/13/2018   BUN 24 (H) 07/13/2018   CO2  27 07/13/2018   TSH 1.880 04/15/2017   PSA 1.75 07/24/2016   INR 1.0 07/13/2018   HGBA1C 7.4 (H) 11/12/2017    Vas US Carotid  Result Date: 08/10/2018 Carotid Arterial Duplex Study Indications:       Carotid artery disease follow up, s/p right proximal to mid  ICA stent                    placed 06/25/2011. Patient has no cerebrovascular symptoms                    today. Risk Factors:      Hypertension, hyperlipidemia, past history of smokeless                    tobacco use, coronary artery disease, peripheral arterial                    disease, and CVA in 08/2011. Other Factors:     History of aortic stenosis. Comparison Study:  Previous carotid duplex exam done 08/12/2017 showed velocities                    in right distal stent 109/36 cm/sec and left proximal ICA                    91/27 cm/sec. Performing Technologist: Salvadore Dom RVT, RDCS (AE), RDMS Supporting Technologist: Wilkie Aye RVT  Examination Guidelines: A complete evaluation includes B-mode imaging, spectral Doppler, color Doppler, and power Doppler as needed of all accessible portions of each vessel. Bilateral testing is considered an integral part of a complete examination. Limited examinations for reoccurring indications may be performed as noted.  Right Carotid Findings: +----------+--------+--------+--------+------------+--------+             PSV cm/s EDV cm/s Stenosis Describe     Comments  +----------+--------+--------+--------+------------+--------+  CCA Prox   109      23                                       +----------+--------+--------+--------+------------+--------+  CCA Mid    83       21                heterogenous           +----------+--------+--------+--------+------------+--------+  CCA Distal 66       13                                       +----------+--------+--------+--------+------------+--------+  ICA Distal 102      26                                        +----------+--------+--------+--------+------------+--------+  ECA        228      11       >50%     heterogenous           +----------+--------+--------+--------+------------+--------+ +----------+--------+-------+----------------+-------------------+             PSV cm/s EDV cms Describe         Arm Pressure (mmHG)  +----------+--------+-------+----------------+-------------------+  Subclavian 92               Multiphasic, WNL 150                  +----------+--------+-------+----------------+-------------------+ +---------+--------+--+--------+-+---------+  Vertebral PSV cm/s 29 EDV cm/s 7 Antegrade  +---------+--------+--+--------+-+---------+  Right Stent(s): +-------------------+--------+--------+--------+--------+--------+  proximal to mid ICA PSV cm/s EDV cm/s Stenosis Waveform Comments  +-------------------+--------+--------+--------+--------+--------+  Prox to Stent       77       16                                   +-------------------+--------+--------+--------+--------+--------+  Proximal Stent      66       15                                   +-------------------+--------+--------+--------+--------+--------+  Mid Stent           81       27                                   +-------------------+--------+--------+--------+--------+--------+  Distal Stent        108      36                                   +-------------------+--------+--------+--------+--------+--------+  Distal to Stent     117      30                                   +-------------------+--------+--------+--------+--------+--------+ Widely patent ICA stent.  Left Carotid Findings: +----------+--------+--------+--------+-------------------------+--------+             PSV cm/s EDV cm/s Stenosis Describe                  Comments  +----------+--------+--------+--------+-------------------------+--------+  CCA Prox   113      18                                                     +----------+--------+--------+--------+-------------------------+--------+  CCA Mid                               heterogenous                        +----------+--------+--------+--------+-------------------------+--------+  CCA Distal 84       21                                                    +----------+--------+--------+--------+-------------------------+--------+  ICA Prox   83       27                heterogenous and calcific           +----------+--------+--------+--------+-------------------------+--------+  ICA Mid    116      36       1-39%                                        +----------+--------+--------+--------+-------------------------+--------+  ICA Distal 109  36                                          tortuous  +----------+--------+--------+--------+-------------------------+--------+  ECA        155      18                heterogenous and calcific           +----------+--------+--------+--------+-------------------------+--------+ +----------+--------+--------+----------------+-------------------+  Subclavian PSV cm/s EDV cm/s Describe         Arm Pressure (mmHG)  +----------+--------+--------+----------------+-------------------+             107               Multiphasic, WNL 140                  +----------+--------+--------+----------------+-------------------+ +---------+--------+--+--------+--+---------+  Vertebral PSV cm/s 51 EDV cm/s 14 Antegrade  +---------+--------+--+--------+--+---------+  Summary: Right Carotid: The ECA appears >50% stenosed. Patent right ICA stent without                evidence of stenosis. Left Carotid: Velocities in the left ICA are consistent with a 1-39% stenosis. Vertebrals:  Bilateral vertebral arteries demonstrate antegrade flow. Subclavians: Normal flow hemodynamics were seen in bilateral subclavian              arteries. *See table(s) above for measurements and observations. Suggest follow up study in 12 months. Electronically signed by Ena Dawley MD on 08/10/2018 at 12:30:30 PM.    Final     Assessment & Plan:   There are no diagnoses linked to this encounter.   No orders of the defined types were placed in this encounter.    Follow-up: No follow-ups on file.  Walker Kehr, MD

## 2018-10-18 NOTE — Assessment & Plan Note (Signed)
On B12 

## 2018-10-18 NOTE — Assessment & Plan Note (Signed)
Losartan HCT 

## 2018-10-18 NOTE — Assessment & Plan Note (Signed)
Metformin 

## 2018-12-02 ENCOUNTER — Other Ambulatory Visit: Payer: Self-pay | Admitting: Internal Medicine

## 2018-12-30 ENCOUNTER — Ambulatory Visit: Payer: Medicare Other

## 2019-01-19 ENCOUNTER — Other Ambulatory Visit: Payer: Self-pay

## 2019-01-19 ENCOUNTER — Encounter: Payer: Self-pay | Admitting: Internal Medicine

## 2019-01-19 ENCOUNTER — Other Ambulatory Visit (INDEPENDENT_AMBULATORY_CARE_PROVIDER_SITE_OTHER): Payer: Medicare Other

## 2019-01-19 ENCOUNTER — Ambulatory Visit (INDEPENDENT_AMBULATORY_CARE_PROVIDER_SITE_OTHER): Payer: Medicare Other | Admitting: Internal Medicine

## 2019-01-19 VITALS — BP 128/66 | HR 73 | Temp 98.2°F | Ht 70.0 in | Wt 196.0 lb

## 2019-01-19 DIAGNOSIS — I6523 Occlusion and stenosis of bilateral carotid arteries: Secondary | ICD-10-CM

## 2019-01-19 DIAGNOSIS — R197 Diarrhea, unspecified: Secondary | ICD-10-CM | POA: Diagnosis not present

## 2019-01-19 DIAGNOSIS — Z23 Encounter for immunization: Secondary | ICD-10-CM | POA: Diagnosis not present

## 2019-01-19 DIAGNOSIS — E1159 Type 2 diabetes mellitus with other circulatory complications: Secondary | ICD-10-CM

## 2019-01-19 LAB — CBC WITH DIFFERENTIAL/PLATELET
Basophils Absolute: 0 10*3/uL (ref 0.0–0.1)
Basophils Relative: 0.4 % (ref 0.0–3.0)
Eosinophils Absolute: 0.2 10*3/uL (ref 0.0–0.7)
Eosinophils Relative: 3.1 % (ref 0.0–5.0)
HCT: 35.5 % — ABNORMAL LOW (ref 39.0–52.0)
Hemoglobin: 12.1 g/dL — ABNORMAL LOW (ref 13.0–17.0)
Lymphocytes Relative: 23.2 % (ref 12.0–46.0)
Lymphs Abs: 1.6 10*3/uL (ref 0.7–4.0)
MCHC: 34 g/dL (ref 30.0–36.0)
MCV: 86.9 fl (ref 78.0–100.0)
Monocytes Absolute: 0.7 10*3/uL (ref 0.1–1.0)
Monocytes Relative: 9.4 % (ref 3.0–12.0)
Neutro Abs: 4.4 10*3/uL (ref 1.4–7.7)
Neutrophils Relative %: 63.9 % (ref 43.0–77.0)
Platelets: 243 10*3/uL (ref 150.0–400.0)
RBC: 4.08 Mil/uL — ABNORMAL LOW (ref 4.22–5.81)
RDW: 13.8 % (ref 11.5–15.5)
WBC: 6.9 10*3/uL (ref 4.0–10.5)

## 2019-01-19 LAB — BASIC METABOLIC PANEL
BUN: 20 mg/dL (ref 6–23)
CO2: 28 mEq/L (ref 19–32)
Calcium: 9.7 mg/dL (ref 8.4–10.5)
Chloride: 103 mEq/L (ref 96–112)
Creatinine, Ser: 1.29 mg/dL (ref 0.40–1.50)
GFR: 54.1 mL/min — ABNORMAL LOW (ref >60.00)
Glucose, Bld: 156 mg/dL — ABNORMAL HIGH (ref 70–99)
Potassium: 4.1 mEq/L (ref 3.5–5.1)
Sodium: 139 mEq/L (ref 135–145)

## 2019-01-19 LAB — TSH: TSH: 1.63 u[IU]/mL (ref 0.35–4.50)

## 2019-01-19 LAB — HEMOGLOBIN A1C: Hgb A1c MFr Bld: 7.2 % — ABNORMAL HIGH (ref 4.6–6.5)

## 2019-01-19 MED ORDER — COLESEVELAM HCL 625 MG PO TABS: 1250.0000 mg | ORAL_TABLET | Freq: Two times a day (BID) | ORAL | 11 refills | Status: DC

## 2019-01-19 NOTE — Progress Notes (Signed)
Subjective:  [name]: Andrew Mechanic., male    DOB: Nov 09, 1940  Age: 78 y.o. MRN: [id number]  CC: No chief complaint on file.   HPI Andrew Malone. presents for CAD, dyslipidemia, HTN C/o chronic diarrhea > 6 months, 8-9 stools/d on a bad day w/occ incontinence - seeing Dr Henrene Pastor: given Imodium. EGD 6/20, colon 4/18  Outpatient Medications Prior to Visit  Medication Sig Dispense Refill   acyclovir (ZOVIRAX) 200 MG capsule Take 1 capsule (200 mg total) by mouth 2 (two) times daily. (Patient taking differently: Take 200 mg by mouth 2 (two) times daily as needed (outbreaks of coldsores). ) 180 capsule 3   ammonium lactate (LAC-HYDRIN) 12 % lotion Apply 1 application topically 2 (two) times daily as needed for dry skin.   6   atorvastatin (LIPITOR) 40 MG tablet Take 1 tablet (40 mg total) by mouth daily. (Patient taking differently: Take 40 mg by mouth at bedtime. ) 90 tablet 3   bismuth subsalicylate (PEPTO BISMOL) 262 MG/15ML suspension Take 30 mLs by mouth every 6 (six) hours as needed for indigestion or diarrhea or loose stools.     Cholecalciferol 1000 units tablet Take 1,000 Units by mouth daily.     citalopram (CELEXA) 10 MG tablet TAKE 1 TABLET(10 MG) BY MOUTH DAILY (Patient taking differently: Take 10 mg by mouth at bedtime. ) 90 tablet 3   clopidogrel (PLAVIX) 75 MG tablet TAKE 1 TABLET(75 MG) BY MOUTH DAILY (Patient taking differently: Take 75 mg by mouth daily. ) 90 tablet 3   Cyanocobalamin (VITAMIN B-12) 1000 MCG SUBL Place 1 tablet (1,000 mcg total) under the tongue daily. 100 tablet 3   hydrochlorothiazide (MICROZIDE) 12.5 MG capsule TAKE 1 CAPSULE(12.5 MG) BY MOUTH DAILY 90 capsule 1   loratadine (CLARITIN) 10 MG tablet Take 10 mg by mouth at bedtime.     losartan (COZAAR) 100 MG tablet TAKE 1 TABLET(100 MG) BY MOUTH DAILY 90 tablet 1   metFORMIN (GLUCOPHAGE-XR) 500 MG 24 hr tablet Take 1,000 mg by mouth daily with supper.   3   metoprolol succinate  (TOPROL-XL) 25 MG 24 hr tablet Take 0.5 tablets (12.5 mg total) by mouth at bedtime. 90 tablet 1   metroNIDAZOLE (FLAGYL) 250 MG tablet Take 1 tablet (250 mg total) by mouth 3 (three) times daily. 30 tablet 0   pantoprazole (PROTONIX) 40 MG tablet TAKE 1 TABLET(40 MG) BY MOUTH DAILY 90 tablet 3   Tamsulosin HCl (FLOMAX) 0.4 MG CAPS Take 0.4 mg by mouth at bedtime.      Facility-Administered Medications Prior to Visit  Medication Dose Route Frequency Provider Last Rate Last Dose   0.9 %  sodium chloride infusion  500 mL Intravenous Once Irene Shipper, MD        ROS: Review of Systems  Constitutional: Positive for fatigue. Negative for appetite change and unexpected weight change.  HENT: Negative for congestion, nosebleeds, sneezing, sore throat and trouble swallowing.   Eyes: Negative for itching and visual disturbance.  Respiratory: Negative for cough.   Cardiovascular: Negative for chest pain, palpitations and leg swelling.  Gastrointestinal: Positive for abdominal pain and diarrhea. Negative for abdominal distention, anal bleeding, blood in stool and nausea.  Genitourinary: Negative for frequency and hematuria.  Musculoskeletal: Negative for back pain, gait problem, joint swelling and neck pain.  Skin: Negative for rash.  Neurological: Negative for dizziness, tremors, speech difficulty and weakness.  Psychiatric/Behavioral: Negative for agitation, dysphoric mood, sleep disturbance and suicidal ideas.  The patient is not nervous/anxious.     Objective:  BP 128/66 (BP Location: Left Arm, Patient Position: Sitting, Cuff Size: Large)    Pulse 73    Temp 98.2 F (36.8 C) (Oral)    Ht 5\' 10"  (1.778 m)    Wt 196 lb (88.9 kg)    SpO2 97%    BMI 28.12 kg/m   BP Readings from Last 3 Encounters:  01/19/19 128/66  10/18/18 140/70  08/04/18 (!) 133/51    Wt Readings from Last 3 Encounters:  01/19/19 196 lb (88.9 kg)  10/18/18 199 lb (90.3 kg)  08/04/18 200 lb (90.7 kg)    Physical  Exam Constitutional:      General: He is not in acute distress.    Appearance: He is well-developed.     Comments: NAD  Eyes:     Conjunctiva/sclera: Conjunctivae normal.     Pupils: Pupils are equal, round, and reactive to light.  Neck:     Musculoskeletal: Normal range of motion.     Thyroid: No thyromegaly.     Vascular: No JVD.  Cardiovascular:     Rate and Rhythm: Normal rate and regular rhythm.     Heart sounds: Normal heart sounds. No murmur. No friction rub. No gallop.   Pulmonary:     Effort: Pulmonary effort is normal. No respiratory distress.     Breath sounds: Normal breath sounds. No wheezing or rales.  Chest:     Chest wall: No tenderness.  Abdominal:     General: Bowel sounds are normal. There is no distension.     Palpations: Abdomen is soft. There is no mass.     Tenderness: There is no abdominal tenderness. There is no guarding or rebound.  Musculoskeletal: Normal range of motion.        General: No tenderness.  Lymphadenopathy:     Cervical: No cervical adenopathy.  Skin:    General: Skin is warm and dry.     Findings: No rash.  Neurological:     Mental Status: He is alert and oriented to person, place, and time.     Cranial Nerves: No cranial nerve deficit.     Motor: No abnormal muscle tone.     Coordination: Coordination normal.     Gait: Gait normal.     Deep Tendon Reflexes: Reflexes are normal and symmetric.  Psychiatric:        Behavior: Behavior normal.        Thought Content: Thought content normal.        Judgment: Judgment normal.     Lab Results  Component Value Date   WBC 4.5 07/13/2018   HGB 11.7 (L) 07/13/2018   HCT 35.4 (L) 07/13/2018   PLT 198 07/13/2018   GLUCOSE 131 (H) 10/18/2018   CHOL 113 06/16/2018   TRIG 125 06/16/2018   HDL 28 (L) 06/16/2018   LDLDIRECT 51.0 07/24/2016   LDLCALC 60 06/16/2018   ALT 25 07/13/2018   AST 17 07/13/2018   NA 136 10/18/2018   K 4.9 10/18/2018   CL 102 10/18/2018   CREATININE 1.45  10/18/2018   BUN 27 (H) 10/18/2018   CO2 29 10/18/2018   TSH 1.880 04/15/2017   PSA 1.75 07/24/2016   INR 1.0 07/13/2018   HGBA1C 7.7 (H) 10/18/2018    Vas US Carotid  Result Date: 08/10/2018 Carotid Arterial Duplex Study Indications:       Carotid artery disease follow up, s/p right proximal to mid  ICA stent                    placed 06/25/2011. Patient has no cerebrovascular symptoms                    today. Risk Factors:      Hypertension, hyperlipidemia, past history of smokeless                    tobacco use, coronary artery disease, peripheral arterial                    disease, and CVA in 08/2011. Other Factors:     History of aortic stenosis. Comparison Study:  Previous carotid duplex exam done 08/12/2017 showed velocities                    in right distal stent 109/36 cm/sec and left proximal ICA                    91/27 cm/sec. Performing Technologist: Salvadore Dom RVT, RDCS (AE), RDMS Supporting Technologist: Wilkie Aye RVT  Examination Guidelines: A complete evaluation includes B-mode imaging, spectral Doppler, color Doppler, and power Doppler as needed of all accessible portions of each vessel. Bilateral testing is considered an integral part of a complete examination. Limited examinations for reoccurring indications may be performed as noted.  Right Carotid Findings: +----------+--------+--------+--------+------------+--------+             PSV cm/s EDV cm/s Stenosis Describe     Comments  +----------+--------+--------+--------+------------+--------+  CCA Prox   109      23                                       +----------+--------+--------+--------+------------+--------+  CCA Mid    83       21                heterogenous           +----------+--------+--------+--------+------------+--------+  CCA Distal 66       13                                       +----------+--------+--------+--------+------------+--------+  ICA Distal 102      26                                        +----------+--------+--------+--------+------------+--------+  ECA        228      11       >50%     heterogenous           +----------+--------+--------+--------+------------+--------+ +----------+--------+-------+----------------+-------------------+             PSV cm/s EDV cms Describe         Arm Pressure (mmHG)  +----------+--------+-------+----------------+-------------------+  Subclavian 92               Multiphasic, WNL 150                  +----------+--------+-------+----------------+-------------------+ +---------+--------+--+--------+-+---------+  Vertebral PSV cm/s 29 EDV cm/s 7 Antegrade  +---------+--------+--+--------+-+---------+  Right Stent(s): +-------------------+--------+--------+--------+--------+--------+  proximal to mid ICA PSV cm/s EDV cm/s Stenosis Waveform Comments  +-------------------+--------+--------+--------+--------+--------+  Prox to Stent       77       16                                   +-------------------+--------+--------+--------+--------+--------+  Proximal Stent      66       15                                   +-------------------+--------+--------+--------+--------+--------+  Mid Stent           81       27                                   +-------------------+--------+--------+--------+--------+--------+  Distal Stent        108      36                                   +-------------------+--------+--------+--------+--------+--------+  Distal to Stent     117      30                                   +-------------------+--------+--------+--------+--------+--------+ Widely patent ICA stent.  Left Carotid Findings: +----------+--------+--------+--------+-------------------------+--------+             PSV cm/s EDV cm/s Stenosis Describe                  Comments  +----------+--------+--------+--------+-------------------------+--------+  CCA Prox   113      18                                                     +----------+--------+--------+--------+-------------------------+--------+  CCA Mid                               heterogenous                        +----------+--------+--------+--------+-------------------------+--------+  CCA Distal 84       21                                                    +----------+--------+--------+--------+-------------------------+--------+  ICA Prox   83       27                heterogenous and calcific           +----------+--------+--------+--------+-------------------------+--------+  ICA Mid    116      36       1-39%                                        +----------+--------+--------+--------+-------------------------+--------+  ICA Distal 109  36                                          tortuous  +----------+--------+--------+--------+-------------------------+--------+  ECA        155      18                heterogenous and calcific           +----------+--------+--------+--------+-------------------------+--------+ +----------+--------+--------+----------------+-------------------+  Subclavian PSV cm/s EDV cm/s Describe         Arm Pressure (mmHG)  +----------+--------+--------+----------------+-------------------+             107               Multiphasic, WNL 140                  +----------+--------+--------+----------------+-------------------+ +---------+--------+--+--------+--+---------+  Vertebral PSV cm/s 51 EDV cm/s 14 Antegrade  +---------+--------+--+--------+--+---------+  Summary: Right Carotid: The ECA appears >50% stenosed. Patent right ICA stent without                evidence of stenosis. Left Carotid: Velocities in the left ICA are consistent with a 1-39% stenosis. Vertebrals:  Bilateral vertebral arteries demonstrate antegrade flow. Subclavians: Normal flow hemodynamics were seen in bilateral subclavian              arteries. *See table(s) above for measurements and observations. Suggest follow up study in 12 months. Electronically signed by Ena Dawley MD on 08/10/2018 at 12:30:30 PM.    Final     Assessment & Plan:   There are no diagnoses linked to this encounter.   No orders of the defined types were placed in this encounter.    Follow-up: No follow-ups on file.  Walker Kehr, MD

## 2019-01-19 NOTE — Assessment & Plan Note (Signed)
C/o chronic diarrhea > 6 months, 8-9 stools/d on a bad day w/occ incontinence - seeing Dr Henrene Pastor: given Imodium. EGD 6/20, colon 4/18 F/u w/Dr Henrene Pastor  Trial of Welchol Imodium

## 2019-03-21 ENCOUNTER — Other Ambulatory Visit: Payer: Self-pay | Admitting: Internal Medicine
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
## 2019-04-10 ENCOUNTER — Telehealth: Payer: Self-pay | Admitting: Internal Medicine

## 2019-04-10 NOTE — Telephone Encounter (Signed)
    Patient has no medication remaining. Spouse  wanting a call to discuss the issues she is having getting meds refilled   1. Which medications need to be refilled? (please list name of each medication and dose if known)  hydrochlorothiazide (MICROZIDE) 12.5 MG capsule losartan (COZAAR) 100 MG tablet   2. Which pharmacy/location (including street and city if local pharmacy) is medication to be sent to?[id number] - [place], [place]  3. Do they need a 30 day or 90 day supply? [place]

## 2019-04-11 ENCOUNTER — Ambulatory Visit (INDEPENDENT_AMBULATORY_CARE_PROVIDER_SITE_OTHER): Payer: Medicare Other | Admitting: Cardiology

## 2019-04-11 ENCOUNTER — Encounter: Payer: Self-pay | Admitting: Cardiology

## 2019-04-11 ENCOUNTER — Other Ambulatory Visit: Payer: Self-pay

## 2019-04-11 VITALS — BP 142/78 | HR 65 | Ht 70.5 in | Wt 200.4 lb

## 2019-04-11 DIAGNOSIS — I10 Essential (primary) hypertension: Secondary | ICD-10-CM

## 2019-04-11 DIAGNOSIS — I350 Nonrheumatic aortic (valve) stenosis: Secondary | ICD-10-CM | POA: Diagnosis not present

## 2019-04-11 DIAGNOSIS — E7800 Pure hypercholesterolemia, unspecified: Secondary | ICD-10-CM

## 2019-04-11 LAB — LIPID PANEL
Chol/HDL Ratio: 4 ratio (ref 0.0–5.0)
Cholesterol, Total: 121 mg/dL (ref 100–199)
HDL: 30 mg/dL — ABNORMAL LOW (ref >39)
LDL Chol Calc (NIH): 67 mg/dL (ref 0–99)
Triglycerides: 132 mg/dL (ref 0–149)
VLDL Cholesterol Cal: 24 mg/dL (ref 5–40)

## 2019-04-11 LAB — COMPREHENSIVE METABOLIC PANEL
ALT: 26 IU/L (ref 0–44)
AST: 19 IU/L (ref 0–40)
Albumin/Globulin Ratio: 2.2 (ref 1.2–2.2)
Albumin: 4.2 g/dL (ref 3.7–4.7)
Alkaline Phosphatase: 112 IU/L (ref 39–117)
BUN/Creatinine Ratio: 16 (ref 10–24)
BUN: 19 mg/dL (ref 8–27)
Bilirubin Total: 0.7 mg/dL (ref 0.0–1.2)
CO2: 26 mmol/L (ref 20–29)
Calcium: 9.7 mg/dL (ref 8.6–10.2)
Chloride: 102 mmol/L (ref 96–106)
Creatinine, Ser: 1.17 mg/dL (ref 0.76–1.27)
GFR calc Af Amer: 70 mL/min/{1.73_m2} (ref >59)
GFR calc non Af Amer: 60 mL/min/{1.73_m2} (ref >59)
Globulin, Total: 1.9 g/dL (ref 1.5–4.5)
Glucose: 125 mg/dL — ABNORMAL HIGH (ref 65–99)
Potassium: 4.8 mmol/L (ref 3.5–5.2)
Sodium: 141 mmol/L (ref 134–144)
Total Protein: 6.1 g/dL (ref 6.0–8.5)

## 2019-04-11 MED ORDER — HYDROCHLOROTHIAZIDE 12.5 MG PO CAPS: ORAL_CAPSULE | ORAL | 1 refills | Status: DC

## 2019-04-11 MED ORDER — LOSARTAN POTASSIUM 100 MG PO TABS: ORAL_TABLET | ORAL | 1 refills | Status: DC

## 2019-04-11 NOTE — Telephone Encounter (Signed)
Rx sent, spouse notified we did not receive anything from pharmacy

## 2019-04-11 NOTE — Patient Instructions (Signed)
Medication Instructions:  NO CHANGE *If you need a refill on your cardiac medications before your next appointment, please call your pharmacy*  Lab Work: Your physician recommends that you HAVE LAB WORK TODAY  If you have labs (blood work) drawn today and your tests are completely normal, you will receive your results only by: Marland Kitchen MyChart Message (if you have MyChart) OR . A paper copy in the mail If you have any lab test that is abnormal or we need to change your treatment, we will call you to review the results.  Testing/Procedures: Your physician has requested that you have an echocardiogram. Echocardiography is a painless test that uses sound waves to create images of your heart. It provides your doctor with information about the size and shape of your heart and how well your heart's chambers and valves are working. This procedure takes approximately one hour. There are no restrictions for this procedure.[place]    Follow-Up: At [facility], you and your health needs are our priority.  As part of our continuing mission to provide you with exceptional heart care, we have created designated Provider Care Teams.  These Care Teams include your primary Cardiologist (physician) and Advanced Practice Providers (APPs -  Physician Assistants and Nurse Practitioners) who all work together to provide you with the care you need, when you need it.  Your next appointment:   6 month(s)  The format for your next appointment:   Either In Person or Virtual  Provider:   You may see Kirk Ruths MD or one of the following Advanced Practice Providers on your designated Care Team:    Kerin Ransom, PA-C  [place], [place]  Coletta Memos, [place]

## 2019-04-12 ENCOUNTER — Encounter: Payer: Self-pay | Admitting: *Deleted

## 2019-04-12 ENCOUNTER — Telehealth: Payer: Self-pay | Admitting: *Deleted

## 2019-04-12 NOTE — Telephone Encounter (Addendum)
Letter of results sent to pt   ----- Message from Lelon Perla, MD sent at 04/11/2019  4:50 PM EST ----- No change in meds Kirk Ruths

## 2019-04-14 ENCOUNTER — Other Ambulatory Visit: Payer: Self-pay

## 2019-04-14 ENCOUNTER — Ambulatory Visit (HOSPITAL_COMMUNITY)
Admission: RE | Admit: 2019-04-14 | Discharge: 2019-04-14 | Disposition: A | Discharge status: Home / Self Care | Payer: Medicare Other | Source: Ambulatory Visit | Attending: Cardiology | Admitting: Cardiology

## 2019-04-14 DIAGNOSIS — I350 Nonrheumatic aortic (valve) stenosis: Secondary | ICD-10-CM | POA: Diagnosis not present

## 2019-04-14 NOTE — Progress Notes (Signed)
*  PRELIMINARY RESULTS* Echocardiogram 2D Echocardiogram has been performed.  Andrew Malone 04/14/2019, 9:33 AM

## 2019-04-21 ENCOUNTER — Ambulatory Visit: Payer: Medicare Other | Admitting: Internal Medicine

## 2019-04-24 ENCOUNTER — Encounter: Payer: Self-pay | Admitting: Internal Medicine

## 2019-04-24 ENCOUNTER — Other Ambulatory Visit: Payer: Self-pay

## 2019-04-24 ENCOUNTER — Ambulatory Visit (INDEPENDENT_AMBULATORY_CARE_PROVIDER_SITE_OTHER): Payer: Medicare Other | Admitting: Internal Medicine

## 2019-04-24 DIAGNOSIS — I2510 Atherosclerotic heart disease of native coronary artery without angina pectoris: Secondary | ICD-10-CM | POA: Diagnosis not present

## 2019-04-24 DIAGNOSIS — E1159 Type 2 diabetes mellitus with other circulatory complications: Secondary | ICD-10-CM

## 2019-04-24 DIAGNOSIS — E7800 Pure hypercholesterolemia, unspecified: Secondary | ICD-10-CM | POA: Diagnosis not present

## 2019-04-24 DIAGNOSIS — F329 Major depressive disorder, single episode, unspecified: Secondary | ICD-10-CM | POA: Diagnosis not present

## 2019-04-24 DIAGNOSIS — I10 Essential (primary) hypertension: Secondary | ICD-10-CM | POA: Diagnosis not present

## 2019-04-24 MED ORDER — CITALOPRAM HYDROBROMIDE 10 MG PO TABS: ORAL_TABLET | ORAL | 3 refills | Status: DC

## 2019-04-24 MED ORDER — ATORVASTATIN CALCIUM 40 MG PO TABS: 40.0000 mg | ORAL_TABLET | Freq: Every day | ORAL | 3 refills | Status: DC

## 2019-04-24 NOTE — Assessment & Plan Note (Signed)
On Celexa 

## 2019-04-24 NOTE — Assessment & Plan Note (Signed)
Losartan HCT 

## 2019-04-24 NOTE — Assessment & Plan Note (Signed)
F/u w/Dr Kerr 

## 2019-04-24 NOTE — Assessment & Plan Note (Signed)
Plavix, Losartan HCT, Lipitor

## 2019-04-24 NOTE — Progress Notes (Signed)
Subjective:  [name]: Andrew Mechanic., male    DOB: Sep 13, 1940  Age: 79 y.o. MRN: [id number]  CC: No chief complaint on file.   HPI Andrew LEEDS Sr. presents for CAD, HTN, depression f/u  Outpatient Medications Prior to Visit  Medication Sig Dispense Refill  . acyclovir (ZOVIRAX) 200 MG capsule Take 1 capsule (200 mg total) by mouth 2 (two) times daily. (Patient taking differently: Take 200 mg by mouth 2 (two) times daily as needed (outbreaks of coldsores). ) 180 capsule 3  . ammonium lactate (LAC-HYDRIN) 12 % lotion Apply 1 application topically 2 (two) times daily as needed for dry skin.   6  . bismuth subsalicylate (PEPTO BISMOL) 262 MG/15ML suspension Take 30 mLs by mouth every 6 (six) hours as needed for indigestion or diarrhea or loose stools.    . Cholecalciferol 1000 units tablet Take 1,000 Units by mouth daily.    . clopidogrel (PLAVIX) 75 MG tablet TAKE 1 TABLET(75 MG) BY MOUTH DAILY 90 tablet 1  . colesevelam (WELCHOL) 625 MG tablet Take 2 tablets (1,250 mg total) by mouth 2 (two) times daily with a meal. 120 tablet 11  . Cyanocobalamin (VITAMIN B-12) 1000 MCG SUBL Place 1 tablet (1,000 mcg total) under the tongue daily. 100 tablet 3  . hydrochlorothiazide (MICROZIDE) 12.5 MG capsule TAKE 1 CAPSULE(12.5 MG) BY MOUTH DAILY 90 capsule 1  . loratadine (CLARITIN) 10 MG tablet Take 10 mg by mouth at bedtime.    Marland Kitchen losartan (COZAAR) 100 MG tablet TAKE 1 TABLET(100 MG) BY MOUTH DAILY 90 tablet 1  . metFORMIN (GLUCOPHAGE-XR) 500 MG 24 hr tablet Take 1,000 mg by mouth daily with supper.   3  . metoprolol succinate (TOPROL-XL) 25 MG 24 hr tablet Take 0.5 tablets (12.5 mg total) by mouth at bedtime. 90 tablet 1  . metroNIDAZOLE (FLAGYL) 250 MG tablet Take 1 tablet (250 mg total) by mouth 3 (three) times daily. 30 tablet 0  . pantoprazole (PROTONIX) 40 MG tablet TAKE 1 TABLET(40 MG) BY MOUTH DAILY 90 tablet 3  . Tamsulosin HCl (FLOMAX) 0.4 MG CAPS Take 0.4 mg by mouth at bedtime.      Marland Kitchen atorvastatin (LIPITOR) 40 MG tablet Take 1 tablet (40 mg total) by mouth daily. (Patient taking differently: Take 40 mg by mouth at bedtime. ) 90 tablet 3  . citalopram (CELEXA) 10 MG tablet TAKE 1 TABLET(10 MG) BY MOUTH DAILY (Patient taking differently: Take 10 mg by mouth at bedtime. ) 90 tablet 3   Facility-Administered Medications Prior to Visit  Medication Dose Route Frequency Provider Last Rate Last Admin  . 0.9 %  sodium chloride infusion  500 mL Intravenous Once Irene Shipper, MD        ROS: Review of Systems  Constitutional: Negative for appetite change, fatigue and unexpected weight change.  HENT: Negative for congestion, nosebleeds, sneezing, sore throat and trouble swallowing.   Eyes: Negative for itching and visual disturbance.  Respiratory: Negative for cough.   Cardiovascular: Negative for chest pain, palpitations and leg swelling.  Gastrointestinal: Negative for abdominal distention, blood in stool, diarrhea and nausea.  Genitourinary: Negative for frequency and hematuria.  Musculoskeletal: Positive for arthralgias. Negative for back pain, gait problem, joint swelling and neck pain.  Skin: Negative for rash.  Neurological: Negative for dizziness, tremors, speech difficulty and weakness.  Psychiatric/Behavioral: Negative for agitation, dysphoric mood and sleep disturbance. The patient is not nervous/anxious.   lost wt on diet  Objective:  BP 136/78 (  BP Location: Left Arm, Patient Position: Sitting, Cuff Size: Large)   Pulse 72   Temp 98.3 F (36.8 C) (Oral)   Ht 5' 10.5" (1.791 m)   Wt 198 lb (89.8 kg)   SpO2 96%   BMI 28.01 kg/m   BP Readings from Last 3 Encounters:  04/24/19 136/78  04/11/19 (!) 142/78  01/19/19 128/66    Wt Readings from Last 3 Encounters:  04/24/19 198 lb (89.8 kg)  04/11/19 200 lb 6.4 oz (90.9 kg)  01/19/19 196 lb (88.9 kg)    Physical Exam Constitutional:      General: He is not in acute distress.    Appearance: He is  well-developed.     Comments: NAD  Eyes:     Conjunctiva/sclera: Conjunctivae normal.     Pupils: Pupils are equal, round, and reactive to light.  Neck:     Thyroid: No thyromegaly.     Vascular: No JVD.  Cardiovascular:     Rate and Rhythm: Normal rate and regular rhythm.     Heart sounds: Normal heart sounds. No murmur. No friction rub. No gallop.   Pulmonary:     Effort: Pulmonary effort is normal. No respiratory distress.     Breath sounds: Normal breath sounds. No wheezing or rales.  Chest:     Chest wall: No tenderness.  Abdominal:     General: Bowel sounds are normal. There is no distension.     Palpations: Abdomen is soft. There is no mass.     Tenderness: There is no abdominal tenderness. There is no guarding or rebound.  Musculoskeletal:        General: No tenderness. Normal range of motion.     Cervical back: Normal range of motion.  Lymphadenopathy:     Cervical: No cervical adenopathy.  Skin:    General: Skin is warm and dry.     Findings: No rash.  Neurological:     Mental Status: He is alert and oriented to person, place, and time.     Cranial Nerves: No cranial nerve deficit.     Motor: No abnormal muscle tone.     Coordination: Coordination normal.     Gait: Gait normal.     Deep Tendon Reflexes: Reflexes are normal and symmetric.  Psychiatric:        Behavior: Behavior normal.        Thought Content: Thought content normal.        Judgment: Judgment normal.     Lab Results  Component Value Date   WBC 6.9 01/19/2019   HGB 12.1 (L) 01/19/2019   HCT 35.5 (L) 01/19/2019   PLT 243.0 01/19/2019   GLUCOSE 125 (H) 04/11/2019   CHOL 121 04/11/2019   TRIG 132 04/11/2019   HDL 30 (L) 04/11/2019   LDLDIRECT 51.0 07/24/2016   LDLCALC 67 04/11/2019   ALT 26 04/11/2019   AST 19 04/11/2019   NA 141 04/11/2019   K 4.8 04/11/2019   CL 102 04/11/2019   CREATININE 1.17 04/11/2019   BUN 19 04/11/2019   CO2 26 04/11/2019   TSH 1.63 01/19/2019   PSA 1.75  07/24/2016   INR 1.0 07/13/2018   HGBA1C 7.2 (H) 01/19/2019    ECHOCARDIOGRAM COMPLETE  Result Date: 04/14/2019    ECHOCARDIOGRAM REPORT   Patient Name:   Andrew BABILONIA Sr. Date of Exam: 04/14/2019 Medical Rec #:  [id number]           Height:  70.5 in Accession #:    [id number]          Weight:       200.4 lb Date of Birth:  16-Jul-1940           BSA:          2.10 m Patient Age:    70 years            BP:           134/74 mmHg Patient Gender: M                   HR:           73 bpm. Exam Location:  [facility] Procedure: 2D Echo, Cardiac Doppler and Color Doppler Indications:    I35.0 (ICD-10-CM) - Nonrheumatic aortic valve stenosis  History:        Patient has prior history of Echocardiogram examinations, most                 recent 04/25/2018. Aortic Valve Disease, Signs/Symptoms:Murmur;                 Risk Factors:Hypertension, Diabetes and Dyslipidemia. Occlusion                 and stenosis of carotid artery without mention of cerebral                 infarction (From Hx).  Sonographer:    Alvino Chapel RCS Referring Phys: [place]  1. Left ventricular ejection fraction, by estimation, is 60 to 65%. The left ventricle has normal function. The left ventricle has no regional wall motion abnormalities. There is moderately increased left ventricular hypertrophy. Left ventricular diastolic parameters are consistent with Grade I diastolic dysfunction (impaired relaxation). Elevated left atrial pressure.  2. Right ventricular systolic function is normal. The right ventricular size is normal. There is normal pulmonary artery systolic pressure.  3. Left atrial size was mildly dilated.  4. The mitral valve is abnormal. Trivial mitral valve regurgitation. No evidence of mitral stenosis.  5. The aortic valve is tricuspid. Aortic valve regurgitation is not visualized. Mild to moderate aortic valve stenosis.  6. The inferior vena cava is normal in size with greater than 50% respiratory  variability, suggesting right atrial pressure of 3 mmHg. FINDINGS  Left Ventricle: Left ventricular ejection fraction, by estimation, is 60 to 65%. The left ventricle has normal function. The left ventricle has no regional wall motion abnormalities. There is moderately increased left ventricular hypertrophy. Left ventricular diastolic parameters are consistent with Grade I diastolic dysfunction (impaired relaxation). Elevated left atrial pressure. Right Ventricle: The right ventricular size is normal. No increase in right ventricular wall thickness. Right ventricular systolic function is normal. There is normal pulmonary artery systolic pressure. The tricuspid regurgitant velocity is 2.51 m/s, and  with an assumed right atrial pressure of 3 mmHg, the estimated right ventricular systolic pressure is [id number] mmHg. Left Atrium: Left atrial size was mildly dilated. Right Atrium: Right atrial size was normal in size. Pericardium: There is no evidence of pericardial effusion. Mitral Valve: The mitral valve is abnormal. There is mild thickening of the mitral valve leaflet(s). There is mild calcification of the mitral valve leaflet(s). Moderate mitral annular calcification. Trivial mitral valve regurgitation. No evidence of mitral valve stenosis. Tricuspid Valve: The tricuspid valve is normal in structure. Tricuspid valve regurgitation is mild . No evidence of tricuspid stenosis. Aortic Valve: The aortic valve is tricuspid. Marland Kitchen  There is severe thickening and severe calcifcation of the aortic valve. Aortic valve regurgitation is not visualized. Mild to moderate aortic stenosis is present. Severe aortic valve annular calcification. There is severe thickening of the aortic valve. There is severe calcifcation of the aortic valve. Aortic valve mean gradient measures 19.4 mmHg. Aortic valve peak gradient measures 36.7 mmHg. Aortic valve area, by VTI measures 1.54 cm. Pulmonic Valve: The pulmonic valve was not well visualized.  Pulmonic valve regurgitation is not visualized. No evidence of pulmonic stenosis. Aorta: The aortic root is normal in size and structure. Venous: The inferior vena cava is normal in size with greater than 50% respiratory variability, suggesting right atrial pressure of 3 mmHg. IAS/Shunts: No atrial level shunt detected by color flow Doppler.  LEFT VENTRICLE PLAX 2D LVIDd:         4.37 cm     Diastology LVIDs:         2.46 cm     LV e' lateral:   5.87 cm/s LV PW:         1.33 cm     LV E/e' lateral: 15.3 LV IVS:        1.52 cm     LV e' medial:    6.31 cm/s LVOT diam:     1.90 cm     LV E/e' medial:  14.2 LV SV:         98.10 ml LV SV Index:   30.23 LVOT Area:     2.84 cm  LV Volumes (MOD) LV vol d, MOD A2C: 70.0 ml LV vol d, MOD A4C: 71.8 ml LV vol s, MOD A2C: 28.0 ml LV vol s, MOD A4C: 21.6 ml LV SV MOD A2C:     42.0 ml LV SV MOD A4C:     71.8 ml LV SV MOD BP:      46.5 ml RIGHT VENTRICLE RV S prime:     11.40 cm/s TAPSE (M-mode): 1.8 cm LEFT ATRIUM             Index       RIGHT ATRIUM           Index LA diam:        4.30 cm 2.05 cm/m  RA Area:     15.70 cm LA Vol (A2C):   54.5 ml 25.95 ml/m RA Volume:   40.80 ml  19.43 ml/m LA Vol (A4C):   68.8 ml 32.76 ml/m LA Biplane Vol: 67.6 ml 32.19 ml/m  AORTIC VALVE AV Area (Vmax):    1.31 cm AV Area (Vmean):   1.38 cm AV Area (VTI):     1.54 cm AV Vmax:           302.78 cm/s AV Vmean:          205.775 cm/s AV VTI:            0.635 m AV Peak Grad:      36.7 mmHg AV Mean Grad:      19.4 mmHg LVOT Vmax:         140.00 cm/s LVOT Vmean:        99.800 cm/s LVOT VTI:          0.346 m LVOT/AV VTI ratio: 0.54  AORTA Ao Root diam: 3.30 cm MITRAL VALVE                TRICUSPID VALVE MV Area (PHT): 3.37 cm     TR Peak grad:   25.2 mmHg MV Decel  Time: 225 msec     TR Vmax:        251.00 cm/s MV E velocity: 89.70 cm/s MV A velocity: 112.00 cm/s  SHUNTS MV E/A ratio:  0.80         Systemic VTI:  0.35 m                             Systemic Diam: 1.90 cm Carlyle Dolly MD  Electronically signed by Carlyle Dolly MD Signature Date/Time: 04/14/2019/2:33:39 PM    Final     Assessment & Plan:   Diagnoses and all orders for this visit:  Pure hypercholesterolemia -     atorvastatin (LIPITOR) 40 MG tablet; Take 1 tablet (40 mg total) by mouth daily.  Essential hypertension  Atherosclerosis of native coronary artery of native heart without angina pectoris  Controlled type 2 diabetes mellitus with other circulatory complication, without long-term current use of insulin (HCC)  Reactive depression  Other orders -     citalopram (CELEXA) 10 MG tablet; TAKE 1 TABLET(10 MG) BY MOUTH DAILY     Meds ordered this encounter  Medications  . atorvastatin (LIPITOR) 40 MG tablet    Sig: Take 1 tablet (40 mg total) by mouth daily.    Dispense:  90 tablet    Refill:  3  . citalopram (CELEXA) 10 MG tablet    Sig: TAKE 1 TABLET(10 MG) BY MOUTH DAILY    Dispense:  90 tablet    Refill:  3     Follow-up: Return in about 3 months (around 07/22/2019) for a follow-up visit.  Walker Kehr, MD

## 2019-05-16 DIAGNOSIS — Z125 Encounter for screening for malignant neoplasm of prostate: Secondary | ICD-10-CM | POA: Diagnosis not present

## 2019-05-16 DIAGNOSIS — N400 Enlarged prostate without lower urinary tract symptoms: Secondary | ICD-10-CM | POA: Diagnosis not present

## 2019-06-13 DIAGNOSIS — E119 Type 2 diabetes mellitus without complications: Secondary | ICD-10-CM | POA: Diagnosis not present

## 2019-06-13 DIAGNOSIS — H52203 Unspecified astigmatism, bilateral: Secondary | ICD-10-CM | POA: Diagnosis not present

## 2019-06-13 DIAGNOSIS — Z961 Presence of intraocular lens: Secondary | ICD-10-CM | POA: Diagnosis not present

## 2019-06-13 DIAGNOSIS — H524 Presbyopia: Secondary | ICD-10-CM | POA: Diagnosis not present

## 2019-06-30 DIAGNOSIS — Z5181 Encounter for therapeutic drug level monitoring: Secondary | ICD-10-CM | POA: Diagnosis not present

## 2019-06-30 DIAGNOSIS — I10 Essential (primary) hypertension: Secondary | ICD-10-CM | POA: Diagnosis not present

## 2019-06-30 DIAGNOSIS — E1142 Type 2 diabetes mellitus with diabetic polyneuropathy: Secondary | ICD-10-CM | POA: Diagnosis not present

## 2019-07-26 ENCOUNTER — Other Ambulatory Visit: Payer: Self-pay

## 2019-07-26 ENCOUNTER — Ambulatory Visit (INDEPENDENT_AMBULATORY_CARE_PROVIDER_SITE_OTHER): Payer: Medicare Other | Admitting: Internal Medicine

## 2019-07-26 ENCOUNTER — Encounter: Payer: Self-pay | Admitting: Internal Medicine

## 2019-07-26 DIAGNOSIS — E1159 Type 2 diabetes mellitus with other circulatory complications: Secondary | ICD-10-CM

## 2019-07-26 DIAGNOSIS — D649 Anemia, unspecified: Secondary | ICD-10-CM | POA: Diagnosis not present

## 2019-07-26 DIAGNOSIS — I10 Essential (primary) hypertension: Secondary | ICD-10-CM

## 2019-07-26 DIAGNOSIS — F329 Major depressive disorder, single episode, unspecified: Secondary | ICD-10-CM

## 2019-07-26 DIAGNOSIS — K2100 Gastro-esophageal reflux disease with esophagitis, without bleeding: Secondary | ICD-10-CM

## 2019-07-26 DIAGNOSIS — I2510 Atherosclerotic heart disease of native coronary artery without angina pectoris: Secondary | ICD-10-CM | POA: Diagnosis not present

## 2019-07-26 DIAGNOSIS — E538 Deficiency of other specified B group vitamins: Secondary | ICD-10-CM | POA: Diagnosis not present

## 2019-07-26 DIAGNOSIS — E785 Hyperlipidemia, unspecified: Secondary | ICD-10-CM

## 2019-07-26 LAB — HEPATIC FUNCTION PANEL
ALT: 17 U/L (ref 0–53)
AST: 14 U/L (ref 0–37)
Albumin: 4.4 g/dL (ref 3.5–5.2)
Alkaline Phosphatase: 78 U/L (ref 39–117)
Bilirubin, Direct: 0.2 mg/dL (ref 0.0–0.3)
Total Bilirubin: 0.7 mg/dL (ref 0.2–1.2)
Total Protein: 6.8 g/dL (ref 6.0–8.3)

## 2019-07-26 LAB — BASIC METABOLIC PANEL
BUN: 28 mg/dL — ABNORMAL HIGH (ref 6–23)
CO2: 29 mEq/L (ref 19–32)
Calcium: 9.4 mg/dL (ref 8.4–10.5)
Chloride: 103 mEq/L (ref 96–112)
Creatinine, Ser: 1.49 mg/dL (ref 0.40–1.50)
GFR: 45.75 mL/min — ABNORMAL LOW (ref >60.00)
Glucose, Bld: 150 mg/dL — ABNORMAL HIGH (ref 70–99)
Potassium: 4.6 mEq/L (ref 3.5–5.1)
Sodium: 137 mEq/L (ref 135–145)

## 2019-07-26 LAB — CBC WITH DIFFERENTIAL/PLATELET
Basophils Absolute: 0 10*3/uL (ref 0.0–0.1)
Basophils Relative: 0.3 % (ref 0.0–3.0)
Eosinophils Absolute: 0.2 10*3/uL (ref 0.0–0.7)
Eosinophils Relative: 3.2 % (ref 0.0–5.0)
HCT: 33.9 % — ABNORMAL LOW (ref 39.0–52.0)
Hemoglobin: 11.6 g/dL — ABNORMAL LOW (ref 13.0–17.0)
Lymphocytes Relative: 32.5 % (ref 12.0–46.0)
Lymphs Abs: 1.6 10*3/uL (ref 0.7–4.0)
MCHC: 34.3 g/dL (ref 30.0–36.0)
MCV: 87 fl (ref 78.0–100.0)
Monocytes Absolute: 0.5 10*3/uL (ref 0.1–1.0)
Monocytes Relative: 9.7 % (ref 3.0–12.0)
Neutro Abs: 2.8 10*3/uL (ref 1.4–7.7)
Neutrophils Relative %: 54.3 % (ref 43.0–77.0)
Platelets: 207 10*3/uL (ref 150.0–400.0)
RBC: 3.9 Mil/uL — ABNORMAL LOW (ref 4.22–5.81)
RDW: 13.3 % (ref 11.5–15.5)
WBC: 5.1 10*3/uL (ref 4.0–10.5)

## 2019-07-26 LAB — TSH: TSH: 1.48 u[IU]/mL (ref 0.35–4.50)

## 2019-07-26 NOTE — Assessment & Plan Note (Signed)
On B12 

## 2019-07-26 NOTE — Assessment & Plan Note (Signed)
Metformin 

## 2019-07-26 NOTE — Progress Notes (Signed)
Subjective:  [name]: Andrew Mechanic., male    DOB: 18-Nov-1940  Age: 79 y.o. MRN: [id number]  CC: No chief complaint on file.   HPI Andrew Boga Sr. presents for HTN, DM, HTN, OA f/u  Outpatient Medications Prior to Visit  Medication Sig Dispense Refill  . acyclovir (ZOVIRAX) 200 MG capsule Take 1 capsule (200 mg total) by mouth 2 (two) times daily. (Patient taking differently: Take 200 mg by mouth 2 (two) times daily as needed (outbreaks of coldsores). ) 180 capsule 3  . ammonium lactate (LAC-HYDRIN) 12 % lotion Apply 1 application topically 2 (two) times daily as needed for dry skin.   6  . atorvastatin (LIPITOR) 40 MG tablet Take 1 tablet (40 mg total) by mouth daily. 90 tablet 3  . bismuth subsalicylate (PEPTO BISMOL) 262 MG/15ML suspension Take 30 mLs by mouth every 6 (six) hours as needed for indigestion or diarrhea or loose stools.    . Cholecalciferol 1000 units tablet Take 1,000 Units by mouth daily.    . citalopram (CELEXA) 10 MG tablet TAKE 1 TABLET(10 MG) BY MOUTH DAILY 90 tablet 3  . clopidogrel (PLAVIX) 75 MG tablet TAKE 1 TABLET(75 MG) BY MOUTH DAILY 90 tablet 1  . colesevelam (WELCHOL) 625 MG tablet Take 2 tablets (1,250 mg total) by mouth 2 (two) times daily with a meal. 120 tablet 11  . Cyanocobalamin (VITAMIN B-12) 1000 MCG SUBL Place 1 tablet (1,000 mcg total) under the tongue daily. 100 tablet 3  . hydrochlorothiazide (MICROZIDE) 12.5 MG capsule TAKE 1 CAPSULE(12.5 MG) BY MOUTH DAILY 90 capsule 1  . loratadine (CLARITIN) 10 MG tablet Take 10 mg by mouth at bedtime.    Marland Kitchen losartan (COZAAR) 100 MG tablet TAKE 1 TABLET(100 MG) BY MOUTH DAILY 90 tablet 1  . metFORMIN (GLUCOPHAGE-XR) 500 MG 24 hr tablet Take 1,000 mg by mouth daily with supper.   3  . metoprolol succinate (TOPROL-XL) 25 MG 24 hr tablet Take 0.5 tablets (12.5 mg total) by mouth at bedtime. 90 tablet 1  . pantoprazole (PROTONIX) 40 MG tablet TAKE 1 TABLET(40 MG) BY MOUTH DAILY 90 tablet 3  .  Tamsulosin HCl (FLOMAX) 0.4 MG CAPS Take 0.4 mg by mouth at bedtime.     . metroNIDAZOLE (FLAGYL) 250 MG tablet Take 1 tablet (250 mg total) by mouth 3 (three) times daily. (Patient not taking: Reported on 07/26/2019) 30 tablet 0   Facility-Administered Medications Prior to Visit  Medication Dose Route Frequency Provider Last Rate Last Admin  . 0.9 %  sodium chloride infusion  500 mL Intravenous Once Irene Shipper, MD        ROS: Review of Systems  Constitutional: Negative for appetite change, fatigue and unexpected weight change.  HENT: Negative for congestion, nosebleeds, sneezing, sore throat and trouble swallowing.   Eyes: Negative for itching and visual disturbance.  Respiratory: Negative for cough.   Cardiovascular: Negative for chest pain, palpitations and leg swelling.  Gastrointestinal: Negative for abdominal distention, blood in stool, diarrhea and nausea.  Genitourinary: Negative for frequency and hematuria.  Musculoskeletal: Positive for arthralgias. Negative for back pain, gait problem, joint swelling and neck pain.  Skin: Negative for rash.  Neurological: Negative for dizziness, tremors, speech difficulty and weakness.  Psychiatric/Behavioral: Negative for agitation, dysphoric mood and sleep disturbance. The patient is not nervous/anxious.     Objective:  BP 120/64   Pulse 72   Temp 98.9 F (37.2 C) (Oral)   Ht 5' 10.5" (1.791  m)   Wt 195 lb (88.5 kg)   SpO2 97%   BMI 27.58 kg/m   BP Readings from Last 3 Encounters:  07/26/19 120/64  04/24/19 136/78  04/11/19 (!) 142/78    Wt Readings from Last 3 Encounters:  07/26/19 195 lb (88.5 kg)  04/24/19 198 lb (89.8 kg)  04/11/19 200 lb 6.4 oz (90.9 kg)    Physical Exam Constitutional:      General: He is not in acute distress.    Appearance: He is well-developed.     Comments: NAD  Eyes:     Conjunctiva/sclera: Conjunctivae normal.     Pupils: Pupils are equal, round, and reactive to light.  Neck:      Thyroid: No thyromegaly.     Vascular: No JVD.  Cardiovascular:     Rate and Rhythm: Normal rate and regular rhythm.     Heart sounds: Normal heart sounds. No murmur. No friction rub. No gallop.   Pulmonary:     Effort: Pulmonary effort is normal. No respiratory distress.     Breath sounds: Normal breath sounds. No wheezing or rales.  Chest:     Chest wall: No tenderness.  Abdominal:     General: Bowel sounds are normal. There is no distension.     Palpations: Abdomen is soft. There is no mass.     Tenderness: There is no abdominal tenderness. There is no guarding or rebound.  Musculoskeletal:        General: Tenderness present. Normal range of motion.     Cervical back: Normal range of motion. No tenderness.  Lymphadenopathy:     Cervical: No cervical adenopathy.  Skin:    General: Skin is warm and dry.     Findings: No rash.  Neurological:     Mental Status: He is alert and oriented to person, place, and time.     Cranial Nerves: No cranial nerve deficit.     Motor: No abnormal muscle tone.     Coordination: Coordination normal.     Gait: Gait normal.     Deep Tendon Reflexes: Reflexes are normal and symmetric.  Psychiatric:        Behavior: Behavior normal.        Thought Content: Thought content normal.        Judgment: Judgment normal.   L shoulder - tender w/ROM  Lab Results  Component Value Date   WBC 6.9 01/19/2019   HGB 12.1 (L) 01/19/2019   HCT 35.5 (L) 01/19/2019   PLT 243.0 01/19/2019   GLUCOSE 125 (H) 04/11/2019   CHOL 121 04/11/2019   TRIG 132 04/11/2019   HDL 30 (L) 04/11/2019   LDLDIRECT 51.0 07/24/2016   LDLCALC 67 04/11/2019   ALT 26 04/11/2019   AST 19 04/11/2019   NA 141 04/11/2019   K 4.8 04/11/2019   CL 102 04/11/2019   CREATININE 1.17 04/11/2019   BUN 19 04/11/2019   CO2 26 04/11/2019   TSH 1.63 01/19/2019   PSA 1.75 07/24/2016   INR 1.0 07/13/2018   HGBA1C 7.2 (H) 01/19/2019    ECHOCARDIOGRAM COMPLETE  Result Date: 04/14/2019     ECHOCARDIOGRAM REPORT   Patient Name:   Andrew KANHAI Sr. Date of Exam: 04/14/2019 Medical Rec #:  [id number]           Height:       70.5 in Accession #:    [id number]          Weight:  200.4 lb Date of Birth:  [birth year]           BSA:          2.10 m Patient Age:    36 years            BP:           134/74 mmHg Patient Gender: M                   HR:           73 bpm. Exam Location:  [facility] Procedure: 2D Echo, Cardiac Doppler and Color Doppler Indications:    I35.0 (ICD-10-CM) - Nonrheumatic aortic valve stenosis  History:        Patient has prior history of Echocardiogram examinations, most                 recent 04/25/2018. Aortic Valve Disease, Signs/Symptoms:Murmur;                 Risk Factors:Hypertension, Diabetes and Dyslipidemia. Occlusion                 and stenosis of carotid artery without mention of cerebral                 infarction (From Hx).  Sonographer:    Alvino Chapel RCS Referring Phys: [place]  1. Left ventricular ejection fraction, by estimation, is 60 to 65%. The left ventricle has normal function. The left ventricle has no regional wall motion abnormalities. There is moderately increased left ventricular hypertrophy. Left ventricular diastolic parameters are consistent with Grade I diastolic dysfunction (impaired relaxation). Elevated left atrial pressure.  2. Right ventricular systolic function is normal. The right ventricular size is normal. There is normal pulmonary artery systolic pressure.  3. Left atrial size was mildly dilated.  4. The mitral valve is abnormal. Trivial mitral valve regurgitation. No evidence of mitral stenosis.  5. The aortic valve is tricuspid. Aortic valve regurgitation is not visualized. Mild to moderate aortic valve stenosis.  6. The inferior vena cava is normal in size with greater than 50% respiratory variability, suggesting right atrial pressure of 3 mmHg. FINDINGS  Left Ventricle: Left ventricular ejection fraction, by  estimation, is 60 to 65%. The left ventricle has normal function. The left ventricle has no regional wall motion abnormalities. There is moderately increased left ventricular hypertrophy. Left ventricular diastolic parameters are consistent with Grade I diastolic dysfunction (impaired relaxation). Elevated left atrial pressure. Right Ventricle: The right ventricular size is normal. No increase in right ventricular wall thickness. Right ventricular systolic function is normal. There is normal pulmonary artery systolic pressure. The tricuspid regurgitant velocity is 2.51 m/s, and  with an assumed right atrial pressure of 3 mmHg, the estimated right ventricular systolic pressure is [id number] mmHg. Left Atrium: Left atrial size was mildly dilated. Right Atrium: Right atrial size was normal in size. Pericardium: There is no evidence of pericardial effusion. Mitral Valve: The mitral valve is abnormal. There is mild thickening of the mitral valve leaflet(s). There is mild calcification of the mitral valve leaflet(s). Moderate mitral annular calcification. Trivial mitral valve regurgitation. No evidence of mitral valve stenosis. Tricuspid Valve: The tricuspid valve is normal in structure. Tricuspid valve regurgitation is mild . No evidence of tricuspid stenosis. Aortic Valve: The aortic valve is tricuspid. . There is severe thickening and severe calcifcation of the aortic valve. Aortic valve regurgitation is not visualized. Mild to moderate aortic stenosis is  present. Severe aortic valve annular calcification. There is severe thickening of the aortic valve. There is severe calcifcation of the aortic valve. Aortic valve mean gradient measures 19.4 mmHg. Aortic valve peak gradient measures 36.7 mmHg. Aortic valve area, by VTI measures 1.54 cm. Pulmonic Valve: The pulmonic valve was not well visualized. Pulmonic valve regurgitation is not visualized. No evidence of pulmonic stenosis. Aorta: The aortic root is normal in size and  structure. Venous: The inferior vena cava is normal in size with greater than 50% respiratory variability, suggesting right atrial pressure of 3 mmHg. IAS/Shunts: No atrial level shunt detected by color flow Doppler.  LEFT VENTRICLE PLAX 2D LVIDd:         4.37 cm     Diastology LVIDs:         2.46 cm     LV e' lateral:   5.87 cm/s LV PW:         1.33 cm     LV E/e' lateral: 15.3 LV IVS:        1.52 cm     LV e' medial:    6.31 cm/s LVOT diam:     1.90 cm     LV E/e' medial:  14.2 LV SV:         98.10 ml LV SV Index:   30.23 LVOT Area:     2.84 cm  LV Volumes (MOD) LV vol d, MOD A2C: 70.0 ml LV vol d, MOD A4C: 71.8 ml LV vol s, MOD A2C: 28.0 ml LV vol s, MOD A4C: 21.6 ml LV SV MOD A2C:     42.0 ml LV SV MOD A4C:     71.8 ml LV SV MOD BP:      46.5 ml RIGHT VENTRICLE RV S prime:     11.40 cm/s TAPSE (M-mode): 1.8 cm LEFT ATRIUM             Index       RIGHT ATRIUM           Index LA diam:        4.30 cm 2.05 cm/m  RA Area:     15.70 cm LA Vol (A2C):   54.5 ml 25.95 ml/m RA Volume:   40.80 ml  19.43 ml/m LA Vol (A4C):   68.8 ml 32.76 ml/m LA Biplane Vol: 67.6 ml 32.19 ml/m  AORTIC VALVE AV Area (Vmax):    1.31 cm AV Area (Vmean):   1.38 cm AV Area (VTI):     1.54 cm AV Vmax:           302.78 cm/s AV Vmean:          205.775 cm/s AV VTI:            0.635 m AV Peak Grad:      36.7 mmHg AV Mean Grad:      19.4 mmHg LVOT Vmax:         140.00 cm/s LVOT Vmean:        99.800 cm/s LVOT VTI:          0.346 m LVOT/AV VTI ratio: 0.54  AORTA Ao Root diam: 3.30 cm MITRAL VALVE                TRICUSPID VALVE MV Area (PHT): 3.37 cm     TR Peak grad:   25.2 mmHg MV Decel Time: 225 msec     TR Vmax:        251.00 cm/s MV E velocity: 89.70 cm/s  MV A velocity: 112.00 cm/s  SHUNTS MV E/A ratio:  0.80         Systemic VTI:  0.35 m                             Systemic Diam: 1.90 cm Carlyle Dolly MD Electronically signed by Carlyle Dolly MD Signature Date/Time: 04/14/2019/2:33:39 PM    Final     Assessment & Plan:    Walker Kehr, MD

## 2019-07-26 NOTE — Assessment & Plan Note (Signed)
CBC

## 2019-07-26 NOTE — Assessment & Plan Note (Signed)
On Lipitor 

## 2019-07-26 NOTE — Assessment & Plan Note (Signed)
On Losartan HCT 

## 2019-07-26 NOTE — Assessment & Plan Note (Signed)
On Protonix 

## 2019-07-26 NOTE — Addendum Note (Signed)
Addended by: Boris Lown B on: 07/26/2019 11:06 AM   Modules accepted: Orders

## 2019-07-26 NOTE — Assessment & Plan Note (Signed)
On Citalopram at HS

## 2019-07-27 LAB — IRON,TIBC AND FERRITIN PANEL
%SAT: 31 % (calc) (ref 20–48)
Ferritin: 122 ng/mL (ref 24–380)
Iron: 91 ug/dL (ref 50–180)
TIBC: 290 mcg/dL (calc) (ref 250–425)

## 2019-08-02 ENCOUNTER — Telehealth: Payer: Self-pay | Admitting: Internal Medicine

## 2019-08-02 NOTE — Telephone Encounter (Signed)
Patient's wife informed of results. 

## 2019-08-02 NOTE — Telephone Encounter (Signed)
New Message:   Pt is returning a call for lab results. Please advise. 

## 2019-08-04 ENCOUNTER — Ambulatory Visit (INDEPENDENT_AMBULATORY_CARE_PROVIDER_SITE_OTHER): Payer: Medicare Other | Admitting: Pulmonary Disease

## 2019-08-04 ENCOUNTER — Other Ambulatory Visit: Payer: Self-pay

## 2019-08-04 ENCOUNTER — Encounter: Payer: Self-pay | Admitting: Pulmonary Disease

## 2019-08-04 VITALS — BP 108/62 | HR 64 | Temp 98.4°F | Ht 70.5 in | Wt 197.4 lb

## 2019-08-04 DIAGNOSIS — R42 Dizziness and giddiness: Secondary | ICD-10-CM | POA: Diagnosis not present

## 2019-08-04 DIAGNOSIS — G4733 Obstructive sleep apnea (adult) (pediatric): Secondary | ICD-10-CM

## 2019-08-04 DIAGNOSIS — I2510 Atherosclerotic heart disease of native coronary artery without angina pectoris: Secondary | ICD-10-CM | POA: Diagnosis not present

## 2019-08-04 NOTE — Progress Notes (Signed)
[facility]  Chief Complaint  Patient presents with  . Follow-up    OSA on CPAP    Constitutional:  BP 108/62 (BP Location: Left Arm, Cuff Size: Normal)   Pulse 64   Temp 98.4 F (36.9 C) (Oral)   Ht 5' 10.5" (1.791 m)   Wt 197 lb 6.4 oz (89.5 kg)   SpO2 96%   BMI 27.92 kg/m   Past Medical History:  CAD, Carotid artery disease, CVA, HLD, HTN, GERD, GN, HH, PUD, chronic back pain, Anxiety, Aortic stenosis  Summary:  Andrew Malone. is a 79 y.o. male with obstructive sleep apnea and short sleep cycle.  Subjective:  Uses CPAP w/o difficulty.  Mask fits well.  Not having sinus congestion, sore throat, dry mouth, or aerophagia.  He gets dizzy when he goes from sitting to standing position.  He has f/u with cardiology soon to discuss his aortic stenosis.  Might need TAVR.  Physical Exam:   Appearance - well kempt  ENMT - no sinus tenderness, no nasal discharge, no oral exudate, Mallampati 2  Respiratory - no wheeze, or rales  CV - regular rate and rhythm, 2/6 systolic murmurs  GI - soft, non tender  Lymph - no adenopathy noted in neck  Ext - no edema  Skin - no rashes  Neuro - normal strength, oriented x 3  Psych - normal mood and affect   Assessment/Plan:   Obstructive sleep apnea. - he is compliant with CPAP and reports benefit - continue CPAP 8 cm H2O  Short sleep cycle. - he is trying to get more hours of sleep, and up to about 6 hours per night now  Dizziness. - advised him to discuss with cardiology  A total of 22 minutes addressing patient care on the day of the visit.  Follow up:  Patient Instructions  Follow up in 1 year   Signature:  Chesley Mires, MD [place] Pager: [phone] 08/04/2019, 10:36 AM  Flow Sheet    Sleep tests:   PSG 09/14/10>>AHI 8, SpO2 low 84%  Auto CPAP 07/04/19 to 08/02/19 >> used on 23 of 30 nights with average 6 hrs 37 min.  Average AHI 1.5  with CPAP 8 cm H2O  Cardiac tests:   Echo 04/14/19 >> EF 60 to 65%, grade 1 DD, mild/mod AS  Medications:   Allergies as of 08/04/2019      Reactions   Cimetidine Nausea And Vomiting   ...   Gabapentin Nausea And Vomiting   ...   Jardiance [empagliflozin]    Bleeding in the groin   Levofloxacin Nausea And Vomiting   ...   Lisinopril    cough   Metoclopramide Nausea And Vomiting   ...   Morphine Nausea And Vomiting   ...   Penicillins Other (See Comments)   ...   Pregabalin Other (See Comments)   ...   Promethazine Other (See Comments)   ...   Sucralfate Other (See Comments)   ...   Verapamil Other (See Comments)   ...      Medication List       Accurate as of August 04, 2019 10:36 AM. If you have any questions, ask your nurse or doctor.        acyclovir 200 MG capsule Commonly known as: ZOVIRAX Take 1 capsule (200 mg total) by mouth 2 (two) times daily. What changed:   when to take this  reasons to take this  ammonium lactate 12 % lotion Commonly known as: LAC-HYDRIN Apply 1 application topically 2 (two) times daily as needed for dry skin.   atorvastatin 40 MG tablet Commonly known as: LIPITOR Take 1 tablet (40 mg total) by mouth daily.   bismuth subsalicylate 035 KK/93GH suspension Commonly known as: PEPTO BISMOL Take 30 mLs by mouth every 6 (six) hours as needed for indigestion or diarrhea or loose stools.   Cholecalciferol 25 MCG (1000 UT) tablet Take 1,000 Units by mouth daily.   citalopram 10 MG tablet Commonly known as: CELEXA TAKE 1 TABLET(10 MG) BY MOUTH DAILY   clopidogrel 75 MG tablet Commonly known as: PLAVIX TAKE 1 TABLET(75 MG) BY MOUTH DAILY   colesevelam 625 MG tablet Commonly known as: Welchol Take 2 tablets (1,250 mg total) by mouth 2 (two) times daily with a meal.   hydrochlorothiazide 12.5 MG capsule Commonly known as: MICROZIDE TAKE 1 CAPSULE(12.5 MG) BY MOUTH DAILY   loratadine 10 MG tablet Commonly known as:  CLARITIN Take 10 mg by mouth at bedtime.   losartan 100 MG tablet Commonly known as: COZAAR TAKE 1 TABLET(100 MG) BY MOUTH DAILY   metFORMIN 500 MG 24 hr tablet Commonly known as: GLUCOPHAGE-XR Take 1,000 mg by mouth daily with supper.   metoprolol succinate 25 MG 24 hr tablet Commonly known as: TOPROL-XL Take 0.5 tablets (12.5 mg total) by mouth at bedtime.   pantoprazole 40 MG tablet Commonly known as: PROTONIX TAKE 1 TABLET(40 MG) BY MOUTH DAILY   tamsulosin 0.4 MG Caps capsule Commonly known as: FLOMAX Take 0.4 mg by mouth at bedtime.   Vitamin B-12 1000 MCG Subl Place 1 tablet (1,000 mcg total) under the tongue daily.       Past Surgical History:  He  has a past surgical history that includes Cardiac catheterization (2007); Coronary angioplasty with stent (07/2011); Inguinal hernia repair (~ 2002); Hernia repair (~ 2002); TEE without cardioversion (08/11/2011); Upper gastrointestinal endoscopy; and Colonoscopy.  Family History:  His family history includes Breast cancer in his sister; Coronary artery disease in his mother; Diabetes in his brother, mother, and sister; Emphysema in his brother; Heart attack in his mother; Hyperlipidemia in his mother and sister; Hypertension in his mother and sister; Lung cancer in his father and sister; Stroke in his mother.  Social History:  He  reports that he has never smoked. He quit smokeless tobacco use about 44 years ago.  His smokeless tobacco use included chew. He reports that he does not drink alcohol or use drugs.

## 2019-08-04 NOTE — Patient Instructions (Signed)
Follow up in 1 year.

## 2019-08-10 ENCOUNTER — Other Ambulatory Visit (HOSPITAL_COMMUNITY): Payer: Self-pay | Admitting: Cardiology

## 2019-08-10 ENCOUNTER — Ambulatory Visit (HOSPITAL_COMMUNITY)
Admission: RE | Admit: 2019-08-10 | Discharge: 2019-08-10 | Disposition: A | Discharge status: Home / Self Care | Payer: Medicare Other | Source: Ambulatory Visit | Attending: Internal Medicine | Admitting: Internal Medicine

## 2019-08-10 ENCOUNTER — Other Ambulatory Visit: Payer: Self-pay

## 2019-08-10 DIAGNOSIS — I6523 Occlusion and stenosis of bilateral carotid arteries: Secondary | ICD-10-CM

## 2019-08-10 DIAGNOSIS — Z9889 Other specified postprocedural states: Secondary | ICD-10-CM

## 2019-08-11 ENCOUNTER — Encounter: Payer: Self-pay | Admitting: *Deleted

## 2019-09-10 ENCOUNTER — Other Ambulatory Visit: Payer: Self-pay | Admitting: Internal Medicine

## 2019-09-15 ENCOUNTER — Other Ambulatory Visit: Payer: Self-pay | Admitting: Internal Medicine

## 2019-10-02 ENCOUNTER — Other Ambulatory Visit: Payer: Self-pay | Admitting: Internal Medicine

## 2019-10-09 ENCOUNTER — Other Ambulatory Visit: Payer: Self-pay | Admitting: Internal Medicine
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
## 2019-10-23 ENCOUNTER — Encounter: Payer: Self-pay | Admitting: Cardiology

## 2019-10-23 ENCOUNTER — Ambulatory Visit (INDEPENDENT_AMBULATORY_CARE_PROVIDER_SITE_OTHER): Payer: Medicare Other | Admitting: Cardiology

## 2019-10-23 ENCOUNTER — Other Ambulatory Visit: Payer: Self-pay

## 2019-10-23 VITALS — BP 128/70 | HR 78 | Temp 97.7°F | Ht 70.5 in | Wt 200.8 lb

## 2019-10-23 DIAGNOSIS — I10 Essential (primary) hypertension: Secondary | ICD-10-CM

## 2019-10-23 DIAGNOSIS — E7800 Pure hypercholesterolemia, unspecified: Secondary | ICD-10-CM

## 2019-10-23 DIAGNOSIS — I350 Nonrheumatic aortic (valve) stenosis: Secondary | ICD-10-CM | POA: Diagnosis not present

## 2019-10-23 DIAGNOSIS — I6523 Occlusion and stenosis of bilateral carotid arteries: Secondary | ICD-10-CM | POA: Diagnosis not present

## 2019-10-23 MED ORDER — LOSARTAN POTASSIUM-HCTZ 100-12.5 MG PO TABS: 1.0000 | ORAL_TABLET | Freq: Every day | ORAL | 3 refills | Status: DC

## 2019-10-23 NOTE — Patient Instructions (Signed)
Medication Instructions:   CHANGE TO LOSARTAN-HCTZ 100/12.5 MG ONE TABLET ONCE DAILY  *If you need a refill on your cardiac medications before your next appointment, please call your pharmacy*   Lab Work: If you have labs (blood work) drawn today and your tests are completely normal, you will receive your results only by: Marland Kitchen MyChart Message (if you have MyChart) OR . A paper copy in the mail If you have any lab test that is abnormal or we need to change your treatment, we will call you to review the results.   Follow-Up: At [facility], you and your health needs are our priority.  As part of our continuing mission to provide you with exceptional heart care, we have created designated Provider Care Teams.  These Care Teams include your primary Cardiologist (physician) and Advanced Practice Providers (APPs -  Physician Assistants and Nurse Practitioners) who all work together to provide you with the care you need, when you need it.  We recommend signing up for the patient portal called "MyChart".  Sign up information is provided on this After Visit Summary.  MyChart is used to connect with patients for Virtual Visits (Telemedicine).  Patients are able to view lab/test results, encounter notes, upcoming appointments, etc.  Non-urgent messages can be sent to your provider as well.   To learn more about what you can do with MyChart, go to [email or web address].    Your next appointment:   6 month(s)  The format for your next appointment:   In Person  Provider:   Kirk Ruths MD

## 2019-10-26 ENCOUNTER — Encounter: Payer: Self-pay | Admitting: Internal Medicine

## 2019-10-26 ENCOUNTER — Ambulatory Visit (INDEPENDENT_AMBULATORY_CARE_PROVIDER_SITE_OTHER): Payer: Medicare Other | Admitting: Internal Medicine

## 2019-10-26 ENCOUNTER — Other Ambulatory Visit: Payer: Self-pay

## 2019-10-26 VITALS — BP 120/68 | HR 78 | Temp 98.8°F | Ht 70.5 in | Wt 198.0 lb

## 2019-10-26 DIAGNOSIS — I6523 Occlusion and stenosis of bilateral carotid arteries: Secondary | ICD-10-CM | POA: Diagnosis not present

## 2019-10-26 DIAGNOSIS — N183 Chronic kidney disease, stage 3 unspecified: Secondary | ICD-10-CM | POA: Insufficient documentation

## 2019-10-26 DIAGNOSIS — E1159 Type 2 diabetes mellitus with other circulatory complications: Secondary | ICD-10-CM | POA: Diagnosis not present

## 2019-10-26 DIAGNOSIS — F419 Anxiety disorder, unspecified: Secondary | ICD-10-CM

## 2019-10-26 DIAGNOSIS — F329 Major depressive disorder, single episode, unspecified: Secondary | ICD-10-CM | POA: Diagnosis not present

## 2019-10-26 DIAGNOSIS — N2889 Other specified disorders of kidney and ureter: Secondary | ICD-10-CM

## 2019-10-26 DIAGNOSIS — E538 Deficiency of other specified B group vitamins: Secondary | ICD-10-CM | POA: Diagnosis not present

## 2019-10-26 DIAGNOSIS — I10 Essential (primary) hypertension: Secondary | ICD-10-CM

## 2019-10-26 DIAGNOSIS — Z23 Encounter for immunization: Secondary | ICD-10-CM | POA: Diagnosis not present

## 2019-10-26 MED ORDER — PANTOPRAZOLE SODIUM 40 MG PO TBEC: DELAYED_RELEASE_TABLET | ORAL | 3 refills | Status: DC

## 2019-10-26 NOTE — Assessment & Plan Note (Signed)
On B12 

## 2019-10-26 NOTE — Assessment & Plan Note (Signed)
On Citalopram 

## 2019-10-26 NOTE — Assessment & Plan Note (Signed)
H/o glomerulonephritis

## 2019-10-26 NOTE — Assessment & Plan Note (Signed)
On Losartan HCT 

## 2019-10-26 NOTE — Progress Notes (Signed)
Subjective:  [name]: [name], male    DOB: May 01, 1940  Age: 79 y.o. MRN: [id number]  CC: No chief complaint on file.   HPI ISLAM EICHINGER Sr. presents for CAD, OA, dyslipidemia f/u  Outpatient Medications Prior to Visit  Medication Sig Dispense Refill  . acyclovir (ZOVIRAX) 200 MG capsule TAKE 1 CAPSULE(200 MG) BY MOUTH TWICE DAILY 180 capsule 3  . ammonium lactate (LAC-HYDRIN) 12 % lotion Apply 1 application topically 2 (two) times daily as needed for dry skin.   6  . atorvastatin (LIPITOR) 40 MG tablet Take 1 tablet (40 mg total) by mouth daily. 90 tablet 3  . bismuth subsalicylate (PEPTO BISMOL) 262 MG/15ML suspension Take 30 mLs by mouth every 6 (six) hours as needed for indigestion or diarrhea or loose stools.    . Cholecalciferol 1000 units tablet Take 1,000 Units by mouth daily.    . citalopram (CELEXA) 10 MG tablet TAKE 1 TABLET(10 MG) BY MOUTH DAILY 90 tablet 3  . clopidogrel (PLAVIX) 75 MG tablet TAKE 1 TABLET(75 MG) BY MOUTH DAILY 90 tablet 3  . colesevelam (WELCHOL) 625 MG tablet Take 2 tablets (1,250 mg total) by mouth 2 (two) times daily with a meal. 120 tablet 11  . Cyanocobalamin (VITAMIN B-12) 1000 MCG SUBL Place 1 tablet (1,000 mcg total) under the tongue daily. 100 tablet 3  . loratadine (CLARITIN) 10 MG tablet Take 10 mg by mouth at bedtime.    Marland Kitchen losartan-hydrochlorothiazide (HYZAAR) 100-12.5 MG tablet Take 1 tablet by mouth daily. 90 tablet 3  . metFORMIN (GLUCOPHAGE-XR) 500 MG 24 hr tablet Take 1,000 mg by mouth daily with supper.   3  . metoprolol succinate (TOPROL-XL) 25 MG 24 hr tablet TAKE 1/2 TABLET BY MOUTH AT BEDTIME 90 tablet 2  . pantoprazole (PROTONIX) 40 MG tablet TAKE 1 TABLET(40 MG) BY MOUTH DAILY 90 tablet 3  . Tamsulosin HCl (FLOMAX) 0.4 MG CAPS Take 0.4 mg by mouth at bedtime.      Facility-Administered Medications Prior to Visit  Medication Dose Route Frequency Provider Last Rate Last Admin  . 0.9 %  sodium chloride infusion  500  mL Intravenous Once Irene Shipper, MD        ROS: Review of Systems  Objective:  BP 120/68 (BP Location: Left Arm, Patient Position: Sitting, Cuff Size: Large)   Pulse 78   Temp 98.8 F (37.1 C) (Oral)   Ht 5' 10.5" (1.791 m)   Wt 198 lb (89.8 kg)   SpO2 96%   BMI 28.01 kg/m   BP Readings from Last 3 Encounters:  10/26/19 120/68  10/23/19 128/70  08/04/19 108/62    Wt Readings from Last 3 Encounters:  10/26/19 198 lb (89.8 kg)  10/23/19 200 lb 12.8 oz (91.1 kg)  08/04/19 197 lb 6.4 oz (89.5 kg)    Physical Exam  Lab Results  Component Value Date   WBC 5.1 07/26/2019   HGB 11.6 (L) 07/26/2019   HCT 33.9 (L) 07/26/2019   PLT 207.0 07/26/2019   GLUCOSE 150 (H) 07/26/2019   CHOL 121 04/11/2019   TRIG 132 04/11/2019   HDL 30 (L) 04/11/2019   LDLDIRECT 51.0 07/24/2016   LDLCALC 67 04/11/2019   ALT 17 07/26/2019   AST 14 07/26/2019   NA 137 07/26/2019   K 4.6 07/26/2019   CL 103 07/26/2019   CREATININE 1.49 07/26/2019   BUN 28 (H) 07/26/2019   CO2 29 07/26/2019   TSH 1.48 07/26/2019  PSA 1.75 07/24/2016   INR 1.0 07/13/2018   HGBA1C 7.2 (H) 01/19/2019    VAS US CAROTID  Result Date: 08/10/2019 Carotid Arterial Duplex Study Indications:       Bilateral carotid artery stenosis with history of right                    carotid stent placement. Patient c/o dizziness x 2 months. He                    denies any other cerebrovascular symptoms. Risk Factors:      Hypertension, hyperlipidemia, Diabetes, no history of                    smoking, coronary artery disease, prior CVA. Comparison Study:  In 08/2018, a carotid duplex performed at showed velocities                    of 108/36 cm/s in the RICA, s/p stent placement and 116/36                    cm/s in the LICA. Performing Technologist: Sharlett Iles RVT  Examination Guidelines: A complete evaluation includes B-mode imaging, spectral Doppler, color Doppler, and power Doppler as needed of all accessible portions of  each vessel. Bilateral testing is considered an integral part of a complete examination. Limited examinations for reoccurring indications may be performed as noted.  Right Carotid Findings: +----------+--------+--------+--------+------------------+--------+           PSV cm/sEDV cm/sStenosisPlaque DescriptionComments +----------+--------+--------+--------+------------------+--------+ CCA Prox  99      18                                         +----------+--------+--------+--------+------------------+--------+ CCA Mid   76      14              heterogenous               +----------+--------+--------+--------+------------------+--------+ ICA Distal69      16                                         +----------+--------+--------+--------+------------------+--------+ +----------+--------+-------+----------------+-------------------+           PSV cm/sEDV cmsDescribe        Arm Pressure (mmHG) +----------+--------+-------+----------------+-------------------+ [id number]            Multiphasic, [id number]                 +----------+--------+-------+----------------+-------------------+ +---------+--------+--+--------+-+---------+ VertebralPSV cm/s39EDV cm/s8Antegrade +---------+--------+--+--------+-+---------+ Velocities in the RICA stent remain within normal range and stable compared to the prior exam. Right Stent(s): +--------------------+--------+--------+--------+--------+--------+ distal CCA - mid ICAPSV cm/sEDV cm/sStenosisWaveformComments +--------------------+--------+--------+--------+--------+--------+ Prox to Stent       62      13                               +--------------------+--------+--------+--------+--------+--------+ Proximal Stent      81      13                               +--------------------+--------+--------+--------+--------+--------+ Mid Stent  70      17                                +--------------------+--------+--------+--------+--------+--------+ Distal Stent        110     29                               +--------------------+--------+--------+--------+--------+--------+ Distal to Stent     107     32                               +--------------------+--------+--------+--------+--------+--------+ Widely patent distal CCA-mid ICA stent without evidence of stenosis.  Left Carotid Findings: +----------+--------+--------+--------+------------------+--------+           PSV cm/sEDV cm/sStenosisPlaque DescriptionComments +----------+--------+--------+--------+------------------+--------+ CCA Prox  116     14                                         +----------+--------+--------+--------+------------------+--------+ CCA Mid   95      24                                         +----------+--------+--------+--------+------------------+--------+ CCA Distal73      18              heterogenous               +----------+--------+--------+--------+------------------+--------+ ICA Prox  85      21              heterogenous               +----------+--------+--------+--------+------------------+--------+ ICA Mid   96      30      1-39%   heterogenous               +----------+--------+--------+--------+------------------+--------+ ICA Distal102     31                                         +----------+--------+--------+--------+------------------+--------+ ECA       206     18              heterogenous               +----------+--------+--------+--------+------------------+--------+ +----------+--------+--------+----------------+-------------------+           PSV cm/sEDV cm/sDescribe        Arm Pressure (mmHG) +----------+--------+--------+----------------+-------------------+ [id number]             Multiphasic, [id number]                 +----------+--------+--------+----------------+-------------------+  +---------+--------+--+--------+--+---------+ VertebralPSV cm/s54EDV cm/s15Antegrade +---------+--------+--+--------+--+---------+ Velocities in the LICA remain within normal range and essentially stable compared to the prior exam.  Summary: Right Carotid: Non-hemodynamically significant plaque <50% noted in the CCA.                Widely patent distal CCA-mid ICA stent without evidence of                stenosis. Left Carotid: Velocities in the left ICA  are consistent with a 1-39% stenosis.               Non-hemodynamically significant plaque <50% noted in the CCA. Vertebrals:  Bilateral vertebral arteries demonstrate antegrade flow. Subclavians: Normal flow hemodynamics were seen in bilateral subclavian              arteries. *See table(s) above for measurements and observations. Suggest follow up study in 12 months. Electronically signed by Ida Rogue MD on 08/10/2019 at 5:32:55 PM.    Final     Assessment & Plan:   There are no diagnoses linked to this encounter.   No orders of the defined types were placed in this encounter.    Follow-up: No follow-ups on file.  Walker Kehr, MD

## 2019-10-26 NOTE — Assessment & Plan Note (Signed)
Klonopin prn  Potential benefits of a long term benzodiazepines  use as well as potential risks  and complications were explained to the patient and were aknowledged. 

## 2019-10-26 NOTE — Assessment & Plan Note (Addendum)
Metformin 

## 2019-10-26 NOTE — Addendum Note (Signed)
Addended by: Lauralee Evener C on: 10/26/2019 11:52 AM   Modules accepted: Orders

## 2019-11-23 ENCOUNTER — Other Ambulatory Visit: Payer: Self-pay | Admitting: Internal Medicine

## 2019-11-24 ENCOUNTER — Telehealth: Payer: Self-pay | Admitting: Emergency Medicine

## 2019-11-24 MED ORDER — PROMETHAZINE-CODEINE 6.25-10 MG/5ML PO SYRP: 5.0000 mL | ORAL_SOLUTION | Freq: Four times a day (QID) | ORAL | 0 refills | Status: DC | PRN

## 2019-11-24 NOTE — Telephone Encounter (Signed)
Pt's wife informed of below.  

## 2019-11-24 NOTE — Telephone Encounter (Signed)
Pts wife called and stated pt has a bad cough at night and wants to know if he can have a prescription called in or what DR Plotnikov recommends. Pharmacy is [facility]. Please advise and give patient a call back thanks.

## 2019-11-24 NOTE — Telephone Encounter (Signed)
I emailed a prescription for cough syrup to the pharmacy.  She needs to schedule a virtual office visit if not better with any provider.  Thanks

## 2020-01-19 DIAGNOSIS — Z23 Encounter for immunization: Secondary | ICD-10-CM | POA: Diagnosis not present

## 2020-01-31 ENCOUNTER — Ambulatory Visit (INDEPENDENT_AMBULATORY_CARE_PROVIDER_SITE_OTHER): Payer: Medicare Other | Admitting: Internal Medicine

## 2020-01-31 ENCOUNTER — Encounter: Payer: Self-pay | Admitting: Internal Medicine

## 2020-01-31 ENCOUNTER — Other Ambulatory Visit: Payer: Self-pay

## 2020-01-31 VITALS — BP 112/70 | HR 72 | Temp 98.4°F | Wt 199.4 lb

## 2020-01-31 DIAGNOSIS — M5440 Lumbago with sciatica, unspecified side: Secondary | ICD-10-CM

## 2020-01-31 DIAGNOSIS — N183 Chronic kidney disease, stage 3 unspecified: Secondary | ICD-10-CM | POA: Diagnosis not present

## 2020-01-31 DIAGNOSIS — G8929 Other chronic pain: Secondary | ICD-10-CM | POA: Diagnosis not present

## 2020-01-31 DIAGNOSIS — I10 Essential (primary) hypertension: Secondary | ICD-10-CM

## 2020-01-31 DIAGNOSIS — E1159 Type 2 diabetes mellitus with other circulatory complications: Secondary | ICD-10-CM

## 2020-01-31 DIAGNOSIS — Z23 Encounter for immunization: Secondary | ICD-10-CM | POA: Diagnosis not present

## 2020-01-31 DIAGNOSIS — I6523 Occlusion and stenosis of bilateral carotid arteries: Secondary | ICD-10-CM

## 2020-01-31 DIAGNOSIS — E785 Hyperlipidemia, unspecified: Secondary | ICD-10-CM | POA: Diagnosis not present

## 2020-01-31 DIAGNOSIS — R011 Cardiac murmur, unspecified: Secondary | ICD-10-CM

## 2020-01-31 DIAGNOSIS — E538 Deficiency of other specified B group vitamins: Secondary | ICD-10-CM | POA: Diagnosis not present

## 2020-01-31 DIAGNOSIS — I350 Nonrheumatic aortic (valve) stenosis: Secondary | ICD-10-CM | POA: Diagnosis not present

## 2020-01-31 LAB — COMPREHENSIVE METABOLIC PANEL
ALT: 29 U/L (ref 0–53)
AST: 18 U/L (ref 0–37)
Albumin: 4.4 g/dL (ref 3.5–5.2)
Alkaline Phosphatase: 80 U/L (ref 39–117)
BUN: 29 mg/dL — ABNORMAL HIGH (ref 6–23)
CO2: 30 mEq/L (ref 19–32)
Calcium: 9.6 mg/dL (ref 8.4–10.5)
Chloride: 102 mEq/L (ref 96–112)
Creatinine, Ser: 1.35 mg/dL (ref 0.40–1.50)
GFR: 50.7 mL/min — ABNORMAL LOW (ref >60.00)
Glucose, Bld: 133 mg/dL — ABNORMAL HIGH (ref 70–99)
Potassium: 4.8 mEq/L (ref 3.5–5.1)
Sodium: 137 mEq/L (ref 135–145)
Total Bilirubin: 0.6 mg/dL (ref 0.2–1.2)
Total Protein: 7.3 g/dL (ref 6.0–8.3)

## 2020-01-31 LAB — LIPID PANEL
Cholesterol: 122 mg/dL (ref 0–200)
HDL: 27.9 mg/dL — ABNORMAL LOW (ref >39.00)
LDL Cholesterol: 66 mg/dL (ref 0–99)
NonHDL: 93.9
Total CHOL/HDL Ratio: 4
Triglycerides: 138 mg/dL (ref 0.0–149.0)
VLDL: 27.6 mg/dL (ref 0.0–40.0)

## 2020-01-31 LAB — TSH: TSH: 1.92 u[IU]/mL (ref 0.35–4.50)

## 2020-01-31 LAB — HEMOGLOBIN A1C: Hgb A1c MFr Bld: 7.6 % — ABNORMAL HIGH (ref 4.6–6.5)

## 2020-01-31 NOTE — Assessment & Plan Note (Signed)
On Losartan HCT 

## 2020-01-31 NOTE — Assessment & Plan Note (Signed)
F/u w/Dr Stanford Breed

## 2020-01-31 NOTE — Progress Notes (Signed)
Subjective:  [name]: Andrew Malone., male    DOB: 1940/06/12  Age: 79 y.o. MRN: [id number]  CC: Follow-up (3 month F/U- Flu shot)   HPI Andrew SOBOLEWSKI Sr. presents for CAD, dyslipidemia, GERD f/u  Outpatient Medications Prior to Visit  Medication Sig Dispense Refill  . acyclovir (ZOVIRAX) 200 MG capsule TAKE 1 CAPSULE(200 MG) BY MOUTH TWICE DAILY 180 capsule 3  . ammonium lactate (LAC-HYDRIN) 12 % lotion Apply 1 application topically 2 (two) times daily as needed for dry skin.   6  . atorvastatin (LIPITOR) 40 MG tablet Take 1 tablet (40 mg total) by mouth daily. 90 tablet 3  . bismuth subsalicylate (PEPTO BISMOL) 262 MG/15ML suspension Take 30 mLs by mouth every 6 (six) hours as needed for indigestion or diarrhea or loose stools.    . Cholecalciferol 1000 units tablet Take 1,000 Units by mouth daily.    . citalopram (CELEXA) 10 MG tablet TAKE 1 TABLET(10 MG) BY MOUTH DAILY 90 tablet 3  . clopidogrel (PLAVIX) 75 MG tablet TAKE 1 TABLET(75 MG) BY MOUTH DAILY 90 tablet 3  . Cyanocobalamin (VITAMIN B-12) 1000 MCG SUBL Place 1 tablet (1,000 mcg total) under the tongue daily. 100 tablet 3  . loratadine (CLARITIN) 10 MG tablet Take 10 mg by mouth at bedtime.    Marland Kitchen losartan-hydrochlorothiazide (HYZAAR) 100-12.5 MG tablet Take 1 tablet by mouth daily. 90 tablet 3  . metFORMIN (GLUCOPHAGE-XR) 500 MG 24 hr tablet Take 1,000 mg by mouth daily with supper.   3  . metoprolol succinate (TOPROL-XL) 25 MG 24 hr tablet TAKE 1/2 TABLET BY MOUTH AT BEDTIME 90 tablet 2  . pantoprazole (PROTONIX) 40 MG tablet TAKE 1 TABLET(40 MG) BY MOUTH DAILY 90 tablet 3  . Tamsulosin HCl (FLOMAX) 0.4 MG CAPS Take 0.4 mg by mouth at bedtime.     . colesevelam (WELCHOL) 625 MG tablet Take 2 tablets (1,250 mg total) by mouth 2 (two) times daily with a meal. 120 tablet 11  . promethazine-codeine (PHENERGAN WITH CODEINE) 6.25-10 MG/5ML syrup Take 5 mLs by mouth every 6 (six) hours as needed for cough. (Patient not  taking: Reported on 01/31/2020) 240 mL 0   Facility-Administered Medications Prior to Visit  Medication Dose Route Frequency Provider Last Rate Last Admin  . 0.9 %  sodium chloride infusion  500 mL Intravenous Once Irene Shipper, MD        ROS: Review of Systems  Constitutional: Negative for appetite change, fatigue and unexpected weight change.  HENT: Negative for congestion, nosebleeds, sneezing, sore throat and trouble swallowing.   Eyes: Negative for itching and visual disturbance.  Respiratory: Negative for cough.   Cardiovascular: Negative for chest pain, palpitations and leg swelling.  Gastrointestinal: Negative for abdominal distention, blood in stool, diarrhea and nausea.  Genitourinary: Negative for frequency and hematuria.  Musculoskeletal: Positive for arthralgias. Negative for back pain, gait problem, joint swelling and neck pain.  Skin: Negative for rash.  Neurological: Positive for light-headedness. Negative for dizziness, tremors, speech difficulty and weakness.  Psychiatric/Behavioral: Negative for agitation, dysphoric mood, sleep disturbance and suicidal ideas. The patient is not nervous/anxious.     Objective:  BP 112/70 (BP Location: Left Arm)   Pulse 72   Temp 98.4 F (36.9 C) (Oral)   Wt 199 lb 6.4 oz (90.4 kg)   SpO2 98%   BMI 28.21 kg/m   BP Readings from Last 3 Encounters:  01/31/20 112/70  10/26/19 120/68  10/23/19 128/70  Wt Readings from Last 3 Encounters:  01/31/20 199 lb 6.4 oz (90.4 kg)  10/26/19 198 lb (89.8 kg)  10/23/19 200 lb 12.8 oz (91.1 kg)    Physical Exam Constitutional:      General: He is not in acute distress.    Appearance: He is well-developed. He is obese.     Comments: NAD  Eyes:     Conjunctiva/sclera: Conjunctivae normal.     Pupils: Pupils are equal, round, and reactive to light.  Neck:     Thyroid: No thyromegaly.     Vascular: No JVD.  Cardiovascular:     Rate and Rhythm: Normal rate and regular rhythm.      Heart sounds: Normal heart sounds. No murmur heard.  No friction rub. No gallop.   Pulmonary:     Effort: Pulmonary effort is normal. No respiratory distress.     Breath sounds: Normal breath sounds. No wheezing or rales.  Chest:     Chest wall: No tenderness.  Abdominal:     General: Bowel sounds are normal. There is no distension.     Palpations: Abdomen is soft. There is no mass.     Tenderness: There is no abdominal tenderness. There is no guarding or rebound.  Musculoskeletal:        General: No tenderness. Normal range of motion.     Cervical back: Normal range of motion.  Lymphadenopathy:     Cervical: No cervical adenopathy.  Skin:    General: Skin is warm and dry.     Findings: No rash.  Neurological:     Mental Status: He is alert and oriented to person, place, and time.     Cranial Nerves: No cranial nerve deficit.     Motor: No abnormal muscle tone.     Coordination: Coordination normal.     Gait: Gait normal.     Deep Tendon Reflexes: Reflexes are normal and symmetric.  Psychiatric:        Behavior: Behavior normal.        Thought Content: Thought content normal.        Judgment: Judgment normal.     Lab Results  Component Value Date   WBC 5.1 07/26/2019   HGB 11.6 (L) 07/26/2019   HCT 33.9 (L) 07/26/2019   PLT 207.0 07/26/2019   GLUCOSE 150 (H) 07/26/2019   CHOL 121 04/11/2019   TRIG 132 04/11/2019   HDL 30 (L) 04/11/2019   LDLDIRECT 51.0 07/24/2016   LDLCALC 67 04/11/2019   ALT 17 07/26/2019   AST 14 07/26/2019   NA 137 07/26/2019   K 4.6 07/26/2019   CL 103 07/26/2019   CREATININE 1.49 07/26/2019   BUN 28 (H) 07/26/2019   CO2 29 07/26/2019   TSH 1.48 07/26/2019   PSA 1.75 07/24/2016   INR 1.0 07/13/2018   HGBA1C 7.2 (H) 01/19/2019    VAS US CAROTID  Result Date: 08/10/2019 Carotid Arterial Duplex Study Indications:       Bilateral carotid artery stenosis with history of right                    carotid stent placement. Patient c/o dizziness  x 2 months. He                    denies any other cerebrovascular symptoms. Risk Factors:      Hypertension, hyperlipidemia, Diabetes, no history of  smoking, coronary artery disease, prior CVA. Comparison Study:  In 08/2018, a carotid duplex performed at showed velocities                    of 108/36 cm/s in the RICA, s/p stent placement and 116/36                    cm/s in the LICA. Performing Technologist: Sharlett Iles RVT  Examination Guidelines: A complete evaluation includes B-mode imaging, spectral Doppler, color Doppler, and power Doppler as needed of all accessible portions of each vessel. Bilateral testing is considered an integral part of a complete examination. Limited examinations for reoccurring indications may be performed as noted.  Right Carotid Findings: +----------+--------+--------+--------+------------------+--------+           PSV cm/sEDV cm/sStenosisPlaque DescriptionComments +----------+--------+--------+--------+------------------+--------+ CCA Prox  99      18                                         +----------+--------+--------+--------+------------------+--------+ CCA Mid   76      14              heterogenous               +----------+--------+--------+--------+------------------+--------+ ICA Distal69      16                                         +----------+--------+--------+--------+------------------+--------+ +----------+--------+-------+----------------+-------------------+           PSV cm/sEDV cmsDescribe        Arm Pressure (mmHG) +----------+--------+-------+----------------+-------------------+ [id number]            Multiphasic, [id number]                 +----------+--------+-------+----------------+-------------------+ +---------+--------+--+--------+-+---------+ VertebralPSV cm/s39EDV cm/s8Antegrade +---------+--------+--+--------+-+---------+ Velocities in the RICA stent remain within normal range and  stable compared to the prior exam. Right Stent(s): +--------------------+--------+--------+--------+--------+--------+ distal CCA - mid ICAPSV cm/sEDV cm/sStenosisWaveformComments +--------------------+--------+--------+--------+--------+--------+ Prox to Stent       62      13                               +--------------------+--------+--------+--------+--------+--------+ Proximal Stent      81      13                               +--------------------+--------+--------+--------+--------+--------+ Mid Stent           70      17                               +--------------------+--------+--------+--------+--------+--------+ Distal Stent        110     29                               +--------------------+--------+--------+--------+--------+--------+ Distal to Stent     107     32                               +--------------------+--------+--------+--------+--------+--------+  Widely patent distal CCA-mid ICA stent without evidence of stenosis.  Left Carotid Findings: +----------+--------+--------+--------+------------------+--------+           PSV cm/sEDV cm/sStenosisPlaque DescriptionComments +----------+--------+--------+--------+------------------+--------+ CCA Prox  116     14                                         +----------+--------+--------+--------+------------------+--------+ CCA Mid   95      24                                         +----------+--------+--------+--------+------------------+--------+ CCA Distal73      18              heterogenous               +----------+--------+--------+--------+------------------+--------+ ICA Prox  85      21              heterogenous               +----------+--------+--------+--------+------------------+--------+ ICA Mid   96      30      1-39%   heterogenous               +----------+--------+--------+--------+------------------+--------+ ICA Distal102     31                                          +----------+--------+--------+--------+------------------+--------+ ECA       206     18              heterogenous               +----------+--------+--------+--------+------------------+--------+ +----------+--------+--------+----------------+-------------------+           PSV cm/sEDV cm/sDescribe        Arm Pressure (mmHG) +----------+--------+--------+----------------+-------------------+ [id number]             Multiphasic, [id number]                 +----------+--------+--------+----------------+-------------------+ +---------+--------+--+--------+--+---------+ VertebralPSV cm/s54EDV cm/s15Antegrade +---------+--------+--+--------+--+---------+ Velocities in the LICA remain within normal range and essentially stable compared to the prior exam.  Summary: Right Carotid: Non-hemodynamically significant plaque <50% noted in the CCA.                Widely patent distal CCA-mid ICA stent without evidence of                stenosis. Left Carotid: Velocities in the left ICA are consistent with a 1-39% stenosis.               Non-hemodynamically significant plaque <50% noted in the CCA. Vertebrals:  Bilateral vertebral arteries demonstrate antegrade flow. Subclavians: Normal flow hemodynamics were seen in bilateral subclavian              arteries. *See table(s) above for measurements and observations. Suggest follow up study in 12 months. Electronically signed by Ida Rogue MD on 08/10/2019 at 5:32:55 PM.    Final     Assessment & Plan:   Ferdie was seen today for follow-up.  Diagnoses and all orders for this visit:  Needs flu shot -     Flu Vaccine QUAD High Dose(Fluad)  No orders of the defined types were placed in this encounter.    Follow-up: No follow-ups on file.  Walker Kehr, MD

## 2020-01-31 NOTE — Assessment & Plan Note (Signed)
CMET 

## 2020-01-31 NOTE — Assessment & Plan Note (Addendum)
Metformin Labs 

## 2020-01-31 NOTE — Assessment & Plan Note (Signed)
On B12 

## 2020-01-31 NOTE — Assessment & Plan Note (Signed)
F/u w/Dr Stanford Breed No sx's at present

## 2020-01-31 NOTE — Assessment & Plan Note (Signed)
Norco prn  Potential benefits of a long term opioids use as well as potential risks (i.e. addiction risk, apnea etc) and complications (i.e. Somnolence, constipation and others) were explained to the patient and were aknowledged. 

## 2020-02-11 ENCOUNTER — Other Ambulatory Visit: Payer: Self-pay | Admitting: Internal Medicine

## 2020-04-22 ENCOUNTER — Other Ambulatory Visit: Payer: Self-pay | Admitting: Internal Medicine

## 2020-04-22 DIAGNOSIS — E7800 Pure hypercholesterolemia, unspecified: Secondary | ICD-10-CM
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
## 2020-04-26 ENCOUNTER — Ambulatory Visit (INDEPENDENT_AMBULATORY_CARE_PROVIDER_SITE_OTHER): Payer: Medicare Other | Admitting: Cardiology

## 2020-04-26 ENCOUNTER — Encounter: Payer: Self-pay | Admitting: Cardiology

## 2020-04-26 ENCOUNTER — Other Ambulatory Visit: Payer: Self-pay

## 2020-04-26 VITALS — BP 110/58 | HR 71 | Ht 70.5 in | Wt 200.6 lb

## 2020-04-26 DIAGNOSIS — I10 Essential (primary) hypertension: Secondary | ICD-10-CM

## 2020-04-26 DIAGNOSIS — I350 Nonrheumatic aortic (valve) stenosis: Secondary | ICD-10-CM | POA: Diagnosis not present

## 2020-04-26 DIAGNOSIS — I6523 Occlusion and stenosis of bilateral carotid arteries: Secondary | ICD-10-CM

## 2020-04-26 DIAGNOSIS — E7800 Pure hypercholesterolemia, unspecified: Secondary | ICD-10-CM

## 2020-04-26 MED ORDER — LOSARTAN POTASSIUM-HCTZ 50-12.5 MG PO TABS: 1.0000 | ORAL_TABLET | Freq: Every day | ORAL | 3 refills | Status: DC

## 2020-04-26 NOTE — Patient Instructions (Signed)
Medication Instructions:   DECREASE LOSARTAN HCT TO 50/12.5 MG ONCE DAILY  *If you need a refill on your cardiac medications before your next appointment, please call your pharmacy*  Testing/Procedures:  Your physician has requested that you have an echocardiogram. Echocardiography is a painless test that uses sound waves to create images of your heart. It provides your doctor with information about the size and shape of your heart and how well your heart's chambers and valves are working. This procedure takes approximately one hour. There are no restrictions for this procedure.[facility] has requested that you have a carotid duplex. This test is an ultrasound of the carotid arteries in your neck. It looks at blood flow through these arteries that supply the brain with blood. Allow one hour for this exam. There are no restrictions or special instructions.SCHEDULE IN JUNE      Follow-Up: At [facility], you and your health needs are our priority.  As part of our continuing mission to provide you with exceptional heart care, we have created designated Provider Care Teams.  These Care Teams include your primary Cardiologist (physician) and Advanced Practice Providers (APPs -  Physician Assistants and Nurse Practitioners) who all work together to provide you with the care you need, when you need it.  We recommend signing up for the patient portal called "MyChart".  Sign up information is provided on this After Visit Summary.  MyChart is used to connect with patients for Virtual Visits (Telemedicine).  Patients are able to view lab/test results, encounter notes, upcoming appointments, etc.  Non-urgent messages can be sent to your provider as well.   To learn more about what you can do with MyChart, go to [email or web address].    Your next appointment:   6 month(s)  The format for your next appointment:   In Person  Provider:   Kirk Ruths, MD

## 2020-04-29 ENCOUNTER — Other Ambulatory Visit: Payer: Self-pay

## 2020-04-29 DIAGNOSIS — L570 Actinic keratosis: Secondary | ICD-10-CM | POA: Diagnosis not present

## 2020-04-29 DIAGNOSIS — L814 Other melanin hyperpigmentation: Secondary | ICD-10-CM | POA: Diagnosis not present

## 2020-04-29 DIAGNOSIS — D225 Melanocytic nevi of trunk: Secondary | ICD-10-CM | POA: Diagnosis not present

## 2020-04-29 DIAGNOSIS — L818 Other specified disorders of pigmentation: Secondary | ICD-10-CM | POA: Diagnosis not present

## 2020-04-29 DIAGNOSIS — X32XXXA Exposure to sunlight, initial encounter: Secondary | ICD-10-CM | POA: Diagnosis not present

## 2020-04-29 DIAGNOSIS — Z1283 Encounter for screening for malignant neoplasm of skin: Secondary | ICD-10-CM | POA: Diagnosis not present

## 2020-04-30 ENCOUNTER — Ambulatory Visit (INDEPENDENT_AMBULATORY_CARE_PROVIDER_SITE_OTHER): Payer: Medicare Other | Admitting: Internal Medicine

## 2020-04-30 ENCOUNTER — Encounter: Payer: Self-pay | Admitting: Internal Medicine

## 2020-04-30 DIAGNOSIS — I6523 Occlusion and stenosis of bilateral carotid arteries: Secondary | ICD-10-CM

## 2020-04-30 DIAGNOSIS — E1159 Type 2 diabetes mellitus with other circulatory complications: Secondary | ICD-10-CM

## 2020-04-30 DIAGNOSIS — N183 Chronic kidney disease, stage 3 unspecified: Secondary | ICD-10-CM

## 2020-04-30 DIAGNOSIS — F329 Major depressive disorder, single episode, unspecified: Secondary | ICD-10-CM | POA: Diagnosis not present

## 2020-04-30 DIAGNOSIS — I700 Atherosclerosis of aorta: Secondary | ICD-10-CM

## 2020-04-30 DIAGNOSIS — F419 Anxiety disorder, unspecified: Secondary | ICD-10-CM

## 2020-04-30 MED ORDER — CITALOPRAM HYDROBROMIDE 10 MG PO TABS: ORAL_TABLET | ORAL | 3 refills | Status: DC

## 2020-04-30 NOTE — Assessment & Plan Note (Signed)
Cont w/Citalopram

## 2020-04-30 NOTE — Assessment & Plan Note (Signed)
On Metformin F/u w/Dr Buddy Duty

## 2020-04-30 NOTE — Progress Notes (Signed)
Subjective:  [name]: [name], male    DOB: 03/20/1940  Age: 80 y.o. MRN: [id number]  CC: Follow-up (3 month f/u)   HPI KEIDEN DESKIN Sr. presents for HTN, CAD, anxiety, aortic stenosis Card f/u - he had it a week ago  Outpatient Medications Prior to Visit  Medication Sig Dispense Refill  . acyclovir (ZOVIRAX) 200 MG capsule TAKE 1 CAPSULE(200 MG) BY MOUTH TWICE DAILY 180 capsule 3  . ammonium lactate (LAC-HYDRIN) 12 % lotion Apply 1 application topically 2 (two) times daily as needed for dry skin.   6  . atorvastatin (LIPITOR) 40 MG tablet TAKE 1 TABLET(40 MG) BY MOUTH DAILY 90 tablet 2  . bismuth subsalicylate (PEPTO BISMOL) 262 MG/15ML suspension Take 30 mLs by mouth every 6 (six) hours as needed for indigestion or diarrhea or loose stools.    . Cholecalciferol 1000 units tablet Take 1,000 Units by mouth daily.    . citalopram (CELEXA) 10 MG tablet TAKE 1 TABLET(10 MG) BY MOUTH DAILY 90 tablet 3  . clopidogrel (PLAVIX) 75 MG tablet TAKE 1 TABLET(75 MG) BY MOUTH DAILY 90 tablet 3  . Cyanocobalamin (VITAMIN B-12) 1000 MCG SUBL Place 1 tablet (1,000 mcg total) under the tongue daily. 100 tablet 3  . loratadine (CLARITIN) 10 MG tablet Take 10 mg by mouth at bedtime.    Marland Kitchen losartan-hydrochlorothiazide (HYZAAR) 50-12.5 MG tablet Take 1 tablet by mouth daily. 90 tablet 3  . metFORMIN (GLUCOPHAGE-XR) 500 MG 24 hr tablet Take 1,000 mg by mouth daily with supper.   3  . metoprolol succinate (TOPROL-XL) 25 MG 24 hr tablet TAKE 1/2 TABLET BY MOUTH AT BEDTIME 90 tablet 2  . pantoprazole (PROTONIX) 40 MG tablet TAKE 1 TABLET(40 MG) BY MOUTH DAILY 90 tablet 3  . tamsulosin (FLOMAX) 0.4 MG CAPS capsule TAKE 1 CAPSULE BY MOUTH EVERY NIGHT AT BEDTIME 30 capsule 11   Facility-Administered Medications Prior to Visit  Medication Dose Route Frequency Provider Last Rate Last Admin  . 0.9 %  sodium chloride infusion  500 mL Intravenous Once Irene Shipper, MD        ROS: Review of  Systems  Constitutional: Negative for appetite change, fatigue and unexpected weight change.  HENT: Negative for congestion, nosebleeds, sneezing, sore throat and trouble swallowing.   Eyes: Negative for itching and visual disturbance.  Respiratory: Positive for shortness of breath. Negative for cough.   Cardiovascular: Negative for chest pain, palpitations and leg swelling.  Gastrointestinal: Negative for abdominal distention, blood in stool, diarrhea and nausea.  Genitourinary: Negative for frequency and hematuria.  Musculoskeletal: Positive for arthralgias. Negative for back pain, gait problem, joint swelling and neck pain.  Skin: Negative for rash.  Neurological: Negative for dizziness, tremors, speech difficulty and weakness.  Psychiatric/Behavioral: Negative for agitation, dysphoric mood and sleep disturbance. The patient is nervous/anxious.     Objective:  BP 134/78 (BP Location: Left Arm)   Pulse 62   Temp 98.9 F (37.2 C) (Oral)   Ht 5' 10.5" (1.791 m)   Wt 200 lb (90.7 kg)   SpO2 97%   BMI 28.29 kg/m   BP Readings from Last 3 Encounters:  04/30/20 134/78  04/26/20 (!) 110/58  01/31/20 112/70    Wt Readings from Last 3 Encounters:  04/30/20 200 lb (90.7 kg)  04/26/20 200 lb 9.6 oz (91 kg)  01/31/20 199 lb 6.4 oz (90.4 kg)    Physical Exam Constitutional:      General: He is  not in acute distress.    Appearance: He is well-developed. He is obese.     Comments: NAD  HENT:     Mouth/Throat:     Mouth: Oropharynx is clear and moist.  Eyes:     Conjunctiva/sclera: Conjunctivae normal.     Pupils: Pupils are equal, round, and reactive to light.  Neck:     Thyroid: No thyromegaly.     Vascular: No JVD.  Cardiovascular:     Rate and Rhythm: Normal rate and regular rhythm.     Pulses: Intact distal pulses.     Heart sounds: Murmur heard.  No friction rub. No gallop.   Pulmonary:     Effort: Pulmonary effort is normal. No respiratory distress.     Breath  sounds: Normal breath sounds. No wheezing or rales.  Chest:     Chest wall: No tenderness.  Abdominal:     General: Bowel sounds are normal. There is no distension.     Palpations: Abdomen is soft. There is no mass.     Tenderness: There is no abdominal tenderness. There is no guarding or rebound.  Musculoskeletal:        General: No tenderness or edema. Normal range of motion.     Cervical back: Normal range of motion.  Lymphadenopathy:     Cervical: No cervical adenopathy.  Skin:    General: Skin is warm and dry.     Findings: No rash.  Neurological:     Mental Status: He is alert and oriented to person, place, and time.     Cranial Nerves: No cranial nerve deficit.     Motor: No abnormal muscle tone.     Coordination: He displays a negative Romberg sign. Coordination normal.     Gait: Gait normal.     Deep Tendon Reflexes: Reflexes are normal and symmetric.  Psychiatric:        Mood and Affect: Mood and affect normal.        Behavior: Behavior normal.        Thought Content: Thought content normal.        Judgment: Judgment normal.     Lab Results  Component Value Date   WBC 5.1 07/26/2019   HGB 11.6 (L) 07/26/2019   HCT 33.9 (L) 07/26/2019   PLT 207.0 07/26/2019   GLUCOSE 133 (H) 01/31/2020   CHOL 122 01/31/2020   TRIG 138.0 01/31/2020   HDL 27.90 (L) 01/31/2020   LDLDIRECT 51.0 07/24/2016   LDLCALC 66 01/31/2020   ALT 29 01/31/2020   AST 18 01/31/2020   NA 137 01/31/2020   K 4.8 01/31/2020   CL 102 01/31/2020   CREATININE 1.35 01/31/2020   BUN 29 (H) 01/31/2020   CO2 30 01/31/2020   TSH 1.92 01/31/2020   PSA 1.75 07/24/2016   INR 1.0 07/13/2018   HGBA1C 7.6 (H) 01/31/2020    VAS US CAROTID  Result Date: 08/10/2019 Carotid Arterial Duplex Study Indications:       Bilateral carotid artery stenosis with history of right                    carotid stent placement. Patient c/o dizziness x 2 months. He                    denies any other cerebrovascular  symptoms. Risk Factors:      Hypertension, hyperlipidemia, Diabetes, no history of  smoking, coronary artery disease, prior CVA. Comparison Study:  In 08/2018, a carotid duplex performed at showed velocities                    of 108/36 cm/s in the RICA, s/p stent placement and 116/36                    cm/s in the LICA. Performing Technologist: Sharlett Iles RVT  Examination Guidelines: A complete evaluation includes B-mode imaging, spectral Doppler, color Doppler, and power Doppler as needed of all accessible portions of each vessel. Bilateral testing is considered an integral part of a complete examination. Limited examinations for reoccurring indications may be performed as noted.  Right Carotid Findings: +----------+--------+--------+--------+------------------+--------+           PSV cm/sEDV cm/sStenosisPlaque DescriptionComments +----------+--------+--------+--------+------------------+--------+ CCA Prox  99      18                                         +----------+--------+--------+--------+------------------+--------+ CCA Mid   76      14              heterogenous               +----------+--------+--------+--------+------------------+--------+ ICA Distal69      16                                         +----------+--------+--------+--------+------------------+--------+ +----------+--------+-------+----------------+-------------------+           PSV cm/sEDV cmsDescribe        Arm Pressure (mmHG) +----------+--------+-------+----------------+-------------------+ [id number]            Multiphasic, [id number]                 +----------+--------+-------+----------------+-------------------+ +---------+--------+--+--------+-+---------+ VertebralPSV cm/s39EDV cm/s8Antegrade +---------+--------+--+--------+-+---------+ Velocities in the RICA stent remain within normal range and stable compared to the prior exam. Right Stent(s):  +--------------------+--------+--------+--------+--------+--------+ distal CCA - mid ICAPSV cm/sEDV cm/sStenosisWaveformComments +--------------------+--------+--------+--------+--------+--------+ Prox to Stent       62      13                               +--------------------+--------+--------+--------+--------+--------+ Proximal Stent      81      13                               +--------------------+--------+--------+--------+--------+--------+ Mid Stent           70      17                               +--------------------+--------+--------+--------+--------+--------+ Distal Stent        110     29                               +--------------------+--------+--------+--------+--------+--------+ Distal to Stent     107     32                               +--------------------+--------+--------+--------+--------+--------+  Widely patent distal CCA-mid ICA stent without evidence of stenosis.  Left Carotid Findings: +----------+--------+--------+--------+------------------+--------+           PSV cm/sEDV cm/sStenosisPlaque DescriptionComments +----------+--------+--------+--------+------------------+--------+ CCA Prox  116     14                                         +----------+--------+--------+--------+------------------+--------+ CCA Mid   95      24                                         +----------+--------+--------+--------+------------------+--------+ CCA Distal73      18              heterogenous               +----------+--------+--------+--------+------------------+--------+ ICA Prox  85      21              heterogenous               +----------+--------+--------+--------+------------------+--------+ ICA Mid   96      30      1-39%   heterogenous               +----------+--------+--------+--------+------------------+--------+ ICA Distal102     31                                          +----------+--------+--------+--------+------------------+--------+ ECA       206     18              heterogenous               +----------+--------+--------+--------+------------------+--------+ +----------+--------+--------+----------------+-------------------+           PSV cm/sEDV cm/sDescribe        Arm Pressure (mmHG) +----------+--------+--------+----------------+-------------------+ [id number]             Multiphasic, [id number]                 +----------+--------+--------+----------------+-------------------+ +---------+--------+--+--------+--+---------+ VertebralPSV cm/s54EDV cm/s15Antegrade +---------+--------+--+--------+--+---------+ Velocities in the LICA remain within normal range and essentially stable compared to the prior exam.  Summary: Right Carotid: Non-hemodynamically significant plaque <50% noted in the CCA.                Widely patent distal CCA-mid ICA stent without evidence of                stenosis. Left Carotid: Velocities in the left ICA are consistent with a 1-39% stenosis.               Non-hemodynamically significant plaque <50% noted in the CCA. Vertebrals:  Bilateral vertebral arteries demonstrate antegrade flow. Subclavians: Normal flow hemodynamics were seen in bilateral subclavian              arteries. *See table(s) above for measurements and observations. Suggest follow up study in 12 months. Electronically signed by Ida Rogue MD on 08/10/2019 at 5:32:55 PM.    Final     Assessment & Plan:     Follow-up: No follow-ups on file.  Walker Kehr, MD

## 2020-04-30 NOTE — Assessment & Plan Note (Signed)
Monitor GFR 

## 2020-05-14 DIAGNOSIS — R3915 Urgency of urination: Secondary | ICD-10-CM | POA: Diagnosis not present

## 2020-05-14 DIAGNOSIS — N5201 Erectile dysfunction due to arterial insufficiency: Secondary | ICD-10-CM | POA: Diagnosis not present

## 2020-05-24 ENCOUNTER — Ambulatory Visit (HOSPITAL_COMMUNITY): Payer: Medicare Other | Attending: Cardiology

## 2020-05-24 ENCOUNTER — Other Ambulatory Visit: Payer: Self-pay

## 2020-05-24 DIAGNOSIS — I350 Nonrheumatic aortic (valve) stenosis: Secondary | ICD-10-CM | POA: Diagnosis present

## 2020-05-24 LAB — ECHOCARDIOGRAM COMPLETE
AR max vel: 1.13 cm2
AV Area VTI: 1.27 cm2
AV Area mean vel: 1.24 cm2
AV Mean grad: 19.6 mmHg
AV Peak grad: 34.5 mmHg
Ao pk vel: 2.94 m/s
Area-P 1/2: 3.03 cm2
S' Lateral: 2.8 cm

## 2020-06-17 DIAGNOSIS — I700 Atherosclerosis of aorta: Secondary | ICD-10-CM | POA: Insufficient documentation

## 2020-06-17 NOTE — Assessment & Plan Note (Signed)
On Lipitor 

## 2020-06-25 ENCOUNTER — Other Ambulatory Visit: Payer: Self-pay

## 2020-06-25 ENCOUNTER — Encounter: Payer: Self-pay | Admitting: Pulmonary Disease

## 2020-06-25 ENCOUNTER — Ambulatory Visit (INDEPENDENT_AMBULATORY_CARE_PROVIDER_SITE_OTHER): Payer: Medicare Other | Admitting: Pulmonary Disease

## 2020-06-25 VITALS — BP 126/62 | HR 72 | Temp 97.5°F | Ht 70.0 in | Wt 193.6 lb

## 2020-06-25 DIAGNOSIS — I6523 Occlusion and stenosis of bilateral carotid arteries: Secondary | ICD-10-CM | POA: Diagnosis not present

## 2020-06-25 DIAGNOSIS — G4733 Obstructive sleep apnea (adult) (pediatric): Secondary | ICD-10-CM

## 2020-06-25 DIAGNOSIS — Z9989 Dependence on other enabling machines and devices: Secondary | ICD-10-CM | POA: Diagnosis not present

## 2020-06-25 NOTE — Patient Instructions (Signed)
Follow up in 1 year.

## 2020-06-25 NOTE — Progress Notes (Addendum)
[facility]  Chief Complaint  Patient presents with  . Follow-up    No complaints currently    Constitutional:  BP 126/62 (BP Location: Left Arm, Cuff Size: Normal)   Pulse 72   Temp (!) 97.5 F (36.4 C) (Other (Comment)) Comment (Src): wrist  Ht 5\' 10"  (1.778 m)   Wt 193 lb 9.6 oz (87.8 kg)   SpO2 98% Comment: Room air  BMI 27.78 kg/m   Past Medical History:  CAD, Carotid artery disease, CVA, HLD, HTN, GERD, GN, HH, PUD, chronic back pain, Anxiety, Aortic stenosis, DM  Past Surgical History:  He  has a past surgical history that includes Cardiac catheterization (2007); Coronary angioplasty with stent (07/2011); Inguinal hernia repair (~ 2002); Hernia repair (~ 2002); TEE without cardioversion (08/11/2011); Upper gastrointestinal endoscopy; and Colonoscopy.  Brief Summary:  Andrew Popelka. is a 80 y.o. male with obstructive sleep apnea and short sleep cycle.      Subjective:   He uses CPAP on regular basis.  Pressure is okay.  He is using nasal pillows mask with chin strap.  Getting more air leak, and this wakes him up.  He nasal mask before, but this caused irritation over the bridge of his nose.  Physical Exam:   Appearance - well kempt   ENMT - no sinus tenderness, no oral exudate, no LAN, Mallampati 3 airway, no stridor  Respiratory - equal breath sounds bilaterally, no wheezing or rales  CV - s1s2 regular rate and rhythm, 2/6 murmur  Ext - no clubbing, no edema  Skin - no rashes  Psych - normal mood and affect   Sleep Tests:   PSG 09/14/10>>AHI 8, SpO2 low 84%  CPAP 05/31/20 to 06/23/20 >> used on 21 of 23 nights with average 6 hrs 41 min.  Average AHI 1 with CPAP 8 cm H2O  Cardiac Tests:   Echo 05/24/20 >> EF 60 to 65%, grade 1 DD, mild LVH, mod AS  Social History:  He  reports that he has never smoked. He quit smokeless tobacco use about 45 years ago.  His smokeless tobacco use included chew. He reports  that he does not drink alcohol and does not use drugs.  Family History:  His family history includes Breast cancer in his sister; Coronary artery disease in his mother; Diabetes in his brother, mother, and sister; Emphysema in his brother; Heart attack in his mother; Hyperlipidemia in his mother and sister; Hypertension in his mother and sister; Lung cancer in his father and sister; Stroke in his mother.     Assessment/Plan:   Obstructive sleep apnea. - he is compliant with CPAP and reports benefit - he uses [employer] for his DME - continue CPAP 8 cm H2O - will have his DME refit his mask  Short sleep cycle. - he is trying to get more hours of sleep, and up to about 6 hours per night now  Aortic stenosis, CAD s/p stent, HTN, HLD. - followed by Dr. Kirk Ruths with [facility]  Time Spent Involved in Patient Care on Day of Examination:  22 minutes  Follow up:  Patient Instructions  Follow up in 1 year   Medication List:   Allergies as of 06/25/2020      Reactions   Jardiance [empagliflozin]    Bleeding in the groin   Lisinopril    cough      Medication List       Accurate as of  June 25, 2020  9:36 AM. If you have any questions, ask your nurse or doctor.        acyclovir 200 MG capsule Commonly known as: ZOVIRAX TAKE 1 CAPSULE(200 MG) BY MOUTH TWICE DAILY   ammonium lactate 12 % lotion Commonly known as: LAC-HYDRIN Apply 1 application topically 2 (two) times daily as needed for dry skin.   atorvastatin 40 MG tablet Commonly known as: LIPITOR TAKE 1 TABLET(40 MG) BY MOUTH DAILY   bismuth subsalicylate 485 IO/27OJ suspension Commonly known as: PEPTO BISMOL Take 30 mLs by mouth every 6 (six) hours as needed for indigestion or diarrhea or loose stools.   Cholecalciferol 25 MCG (1000 UT) tablet Take 1,000 Units by mouth daily.   citalopram 10 MG tablet Commonly known as: CELEXA TAKE 1 TABLET(10 MG) BY MOUTH DAILY   clopidogrel 75 MG tablet Commonly  known as: PLAVIX TAKE 1 TABLET(75 MG) BY MOUTH DAILY   loratadine 10 MG tablet Commonly known as: CLARITIN Take 10 mg by mouth at bedtime.   losartan-hydrochlorothiazide 50-12.5 MG tablet Commonly known as: HYZAAR Take 1 tablet by mouth daily.   metFORMIN 500 MG 24 hr tablet Commonly known as: GLUCOPHAGE-XR Take 1,000 mg by mouth daily with supper.   metoprolol succinate 25 MG 24 hr tablet Commonly known as: TOPROL-XL TAKE 1/2 TABLET BY MOUTH AT BEDTIME   pantoprazole 40 MG tablet Commonly known as: PROTONIX TAKE 1 TABLET(40 MG) BY MOUTH DAILY   tamsulosin 0.4 MG Caps capsule Commonly known as: FLOMAX TAKE 1 CAPSULE BY MOUTH EVERY NIGHT AT BEDTIME   Vitamin B-12 1000 MCG Subl Place 1 tablet (1,000 mcg total) under the tongue daily.       Signature:  Chesley Mires, MD [place] Pager - [phone] 06/25/2020, 9:36 AM

## 2020-07-01 DIAGNOSIS — Z7984 Long term (current) use of oral hypoglycemic drugs: Secondary | ICD-10-CM | POA: Diagnosis not present

## 2020-07-01 DIAGNOSIS — E1142 Type 2 diabetes mellitus with diabetic polyneuropathy: Secondary | ICD-10-CM | POA: Diagnosis not present

## 2020-07-01 DIAGNOSIS — I779 Disorder of arteries and arterioles, unspecified: Secondary | ICD-10-CM | POA: Diagnosis not present

## 2020-07-01 DIAGNOSIS — I10 Essential (primary) hypertension: Secondary | ICD-10-CM | POA: Diagnosis not present

## 2020-07-04 DIAGNOSIS — E119 Type 2 diabetes mellitus without complications: Secondary | ICD-10-CM | POA: Diagnosis not present

## 2020-07-04 DIAGNOSIS — Z961 Presence of intraocular lens: Secondary | ICD-10-CM | POA: Diagnosis not present

## 2020-07-04 DIAGNOSIS — Z794 Long term (current) use of insulin: Secondary | ICD-10-CM | POA: Diagnosis not present

## 2020-07-05 ENCOUNTER — Telehealth: Payer: Self-pay | Admitting: Cardiology

## 2020-07-05 NOTE — Telephone Encounter (Signed)
    Pt's wife said there's a missed call from [place] but no VM, she wanted to know what is the call about, no notes on file but she wanted to make sure its not something important and wanted to speak with a nurse

## 2020-07-05 NOTE — Telephone Encounter (Signed)
Returned call to patients wife (okay per DPR). Per patients wife no message was left from call that was received. Advised patients wife that there was no encounter found in chart for phone call that was received.    Advised patients wife to call back with any issues, questions, or concerns. Patients wife verbalized understanding.

## 2020-07-30 ENCOUNTER — Telehealth: Payer: Self-pay | Admitting: Internal Medicine

## 2020-07-30 ENCOUNTER — Other Ambulatory Visit: Payer: Self-pay

## 2020-07-30 NOTE — Telephone Encounter (Signed)
Patient needs to reschedule their wellness visit, please call them

## 2020-07-31 ENCOUNTER — Encounter: Payer: Self-pay | Admitting: Internal Medicine

## 2020-07-31 ENCOUNTER — Ambulatory Visit: Payer: Medicare Other

## 2020-07-31 ENCOUNTER — Ambulatory Visit (INDEPENDENT_AMBULATORY_CARE_PROVIDER_SITE_OTHER): Payer: Medicare Other | Admitting: Internal Medicine

## 2020-07-31 DIAGNOSIS — E538 Deficiency of other specified B group vitamins: Secondary | ICD-10-CM | POA: Diagnosis not present

## 2020-07-31 DIAGNOSIS — I6523 Occlusion and stenosis of bilateral carotid arteries: Secondary | ICD-10-CM

## 2020-07-31 DIAGNOSIS — N183 Chronic kidney disease, stage 3 unspecified: Secondary | ICD-10-CM

## 2020-07-31 DIAGNOSIS — E1159 Type 2 diabetes mellitus with other circulatory complications: Secondary | ICD-10-CM | POA: Diagnosis not present

## 2020-07-31 DIAGNOSIS — I700 Atherosclerosis of aorta: Secondary | ICD-10-CM | POA: Diagnosis not present

## 2020-07-31 DIAGNOSIS — I10 Essential (primary) hypertension: Secondary | ICD-10-CM | POA: Diagnosis not present

## 2020-07-31 NOTE — Assessment & Plan Note (Signed)
Cont w/B12 

## 2020-07-31 NOTE — Assessment & Plan Note (Signed)
Cont w/Liptor

## 2020-07-31 NOTE — Assessment & Plan Note (Signed)
Will monitor GFR

## 2020-07-31 NOTE — Assessment & Plan Note (Addendum)
Medications - none. Dr Buddy Duty stopped Amaryl due to low glu Metformin  On Trulicity - (new per Dr Buddy Duty) - feeling sleepy

## 2020-07-31 NOTE — Progress Notes (Signed)
Subjective:  [name]: [name], male    DOB: [birth year]  Age: 80 y.o. MRN: [id number]  CC: Follow-up (3 month f/u)   HPI DARICK FETTERS Sr. presents for HTN, CAD, depression On Trulicity - (new per Dr Buddy Duty) - feeling sleepy  Outpatient Medications Prior to Visit  Medication Sig Dispense Refill  . acyclovir (ZOVIRAX) 200 MG capsule TAKE 1 CAPSULE(200 MG) BY MOUTH TWICE DAILY 180 capsule 3  . ammonium lactate (LAC-HYDRIN) 12 % lotion Apply 1 application topically 2 (two) times daily as needed for dry skin.   6  . atorvastatin (LIPITOR) 40 MG tablet TAKE 1 TABLET(40 MG) BY MOUTH DAILY 90 tablet 2  . bismuth subsalicylate (PEPTO BISMOL) 262 MG/15ML suspension Take 30 mLs by mouth every 6 (six) hours as needed for indigestion or diarrhea or loose stools.    . Cholecalciferol 1000 units tablet Take 1,000 Units by mouth daily.    . citalopram (CELEXA) 10 MG tablet TAKE 1 TABLET(10 MG) BY MOUTH DAILY 90 tablet 3  . clopidogrel (PLAVIX) 75 MG tablet TAKE 1 TABLET(75 MG) BY MOUTH DAILY 90 tablet 3  . Cyanocobalamin (VITAMIN B-12) 1000 MCG SUBL Place 1 tablet (1,000 mcg total) under the tongue daily. 100 tablet 3  . Dulaglutide (TRULICITY) 5.63 OV/5.6EP SOPN Take 0.75 mg once a week    . loratadine (CLARITIN) 10 MG tablet Take 10 mg by mouth at bedtime.    Marland Kitchen losartan-hydrochlorothiazide (HYZAAR) 50-12.5 MG tablet Take 1 tablet by mouth daily. 90 tablet 3  . metFORMIN (GLUCOPHAGE-XR) 500 MG 24 hr tablet Take 1,000 mg by mouth daily with supper.   3  . metoprolol succinate (TOPROL-XL) 25 MG 24 hr tablet TAKE 1/2 TABLET BY MOUTH AT BEDTIME 90 tablet 2  . pantoprazole (PROTONIX) 40 MG tablet TAKE 1 TABLET(40 MG) BY MOUTH DAILY 90 tablet 3  . tamsulosin (FLOMAX) 0.4 MG CAPS capsule TAKE 1 CAPSULE BY MOUTH EVERY NIGHT AT BEDTIME 30 capsule 11  . 0.9 %  sodium chloride infusion      No facility-administered medications prior to visit.    ROS: Review of Systems  Constitutional:  Positive for fatigue. Negative for appetite change and unexpected weight change.  HENT: Negative for congestion, nosebleeds, sneezing, sore throat and trouble swallowing.   Eyes: Negative for itching and visual disturbance.  Respiratory: Negative for cough.   Cardiovascular: Negative for chest pain, palpitations and leg swelling.  Gastrointestinal: Negative for abdominal distention, blood in stool, diarrhea and nausea.  Genitourinary: Negative for frequency and hematuria.  Musculoskeletal: Positive for arthralgias. Negative for back pain, gait problem, joint swelling and neck pain.  Skin: Negative for rash.  Neurological: Negative for dizziness, tremors, speech difficulty and weakness.  Psychiatric/Behavioral: Negative for agitation, dysphoric mood and sleep disturbance. The patient is not nervous/anxious.     Objective:  BP (!) 100/52 (BP Location: Left Arm)   Pulse 75   Temp 99.5 F (37.5 C) (Oral)   Ht 5\' 10"  (1.778 m)   Wt 192 lb 9.6 oz (87.4 kg)   SpO2 95%   BMI 27.64 kg/m   BP Readings from Last 3 Encounters:  07/31/20 (!) 100/52  06/25/20 126/62  04/30/20 134/78    Wt Readings from Last 3 Encounters:  07/31/20 192 lb 9.6 oz (87.4 kg)  06/25/20 193 lb 9.6 oz (87.8 kg)  04/30/20 200 lb (90.7 kg)    Physical Exam Constitutional:      General: He is not in acute distress.  Appearance: He is well-developed.     Comments: NAD  Eyes:     Conjunctiva/sclera: Conjunctivae normal.     Pupils: Pupils are equal, round, and reactive to light.  Neck:     Thyroid: No thyromegaly.     Vascular: No JVD.  Cardiovascular:     Rate and Rhythm: Normal rate and regular rhythm.     Heart sounds: Normal heart sounds. No murmur heard. No friction rub. No gallop.   Pulmonary:     Effort: Pulmonary effort is normal. No respiratory distress.     Breath sounds: Normal breath sounds. No wheezing or rales.  Chest:     Chest wall: No tenderness.  Abdominal:     General: Bowel sounds  are normal. There is no distension.     Palpations: Abdomen is soft. There is no mass.     Tenderness: There is no abdominal tenderness. There is no guarding or rebound.  Musculoskeletal:        General: Normal range of motion.     Cervical back: Normal range of motion. Tenderness present.  Lymphadenopathy:     Cervical: No cervical adenopathy.  Skin:    General: Skin is warm and dry.     Findings: No rash.  Neurological:     Mental Status: He is alert and oriented to person, place, and time.     Cranial Nerves: No cranial nerve deficit.     Motor: No abnormal muscle tone.     Coordination: Coordination normal.     Gait: Gait normal.     Deep Tendon Reflexes: Reflexes are normal and symmetric.  Psychiatric:        Behavior: Behavior normal.        Thought Content: Thought content normal.        Judgment: Judgment normal.     Lab Results  Component Value Date   WBC 5.1 07/26/2019   HGB 11.6 (L) 07/26/2019   HCT 33.9 (L) 07/26/2019   PLT 207.0 07/26/2019   GLUCOSE 133 (H) 01/31/2020   CHOL 122 01/31/2020   TRIG 138.0 01/31/2020   HDL 27.90 (L) 01/31/2020   LDLDIRECT 51.0 07/24/2016   LDLCALC 66 01/31/2020   ALT 29 01/31/2020   AST 18 01/31/2020   NA 137 01/31/2020   K 4.8 01/31/2020   CL 102 01/31/2020   CREATININE 1.35 01/31/2020   BUN 29 (H) 01/31/2020   CO2 30 01/31/2020   TSH 1.92 01/31/2020   PSA 1.75 07/24/2016   INR 1.0 07/13/2018   HGBA1C 7.6 (H) 01/31/2020    VAS US CAROTID  Result Date: 08/10/2019 Carotid Arterial Duplex Study Indications:       Bilateral carotid artery stenosis with history of right                    carotid stent placement. Patient c/o dizziness x 2 months. He                    denies any other cerebrovascular symptoms. Risk Factors:      Hypertension, hyperlipidemia, Diabetes, no history of                    smoking, coronary artery disease, prior CVA. Comparison Study:  In 08/2018, a carotid duplex performed at showed velocities                     of 108/36 cm/s in the RICA, s/p stent placement and  116/36                    cm/s in the LICA. Performing Technologist: Sharlett Iles RVT  Examination Guidelines: A complete evaluation includes B-mode imaging, spectral Doppler, color Doppler, and power Doppler as needed of all accessible portions of each vessel. Bilateral testing is considered an integral part of a complete examination. Limited examinations for reoccurring indications may be performed as noted.  Right Carotid Findings: +----------+--------+--------+--------+------------------+--------+           PSV cm/sEDV cm/sStenosisPlaque DescriptionComments +----------+--------+--------+--------+------------------+--------+ CCA Prox  99      18                                         +----------+--------+--------+--------+------------------+--------+ CCA Mid   76      14              heterogenous               +----------+--------+--------+--------+------------------+--------+ ICA Distal69      16                                         +----------+--------+--------+--------+------------------+--------+ +----------+--------+-------+----------------+-------------------+           PSV cm/sEDV cmsDescribe        Arm Pressure (mmHG) +----------+--------+-------+----------------+-------------------+ [id number]            Multiphasic, [id number]                 +----------+--------+-------+----------------+-------------------+ +---------+--------+--+--------+-+---------+ VertebralPSV cm/s39EDV cm/s8Antegrade +---------+--------+--+--------+-+---------+ Velocities in the RICA stent remain within normal range and stable compared to the prior exam. Right Stent(s): +--------------------+--------+--------+--------+--------+--------+ distal CCA - mid ICAPSV cm/sEDV cm/sStenosisWaveformComments +--------------------+--------+--------+--------+--------+--------+ Prox to Stent       62      13                                +--------------------+--------+--------+--------+--------+--------+ Proximal Stent      81      13                               +--------------------+--------+--------+--------+--------+--------+ Mid Stent           70      17                               +--------------------+--------+--------+--------+--------+--------+ Distal Stent        110     29                               +--------------------+--------+--------+--------+--------+--------+ Distal to Stent     107     32                               +--------------------+--------+--------+--------+--------+--------+ Widely patent distal CCA-mid ICA stent without evidence of stenosis.  Left Carotid Findings: +----------+--------+--------+--------+------------------+--------+           PSV cm/sEDV cm/sStenosisPlaque DescriptionComments +----------+--------+--------+--------+------------------+--------+ CCA Prox  116     14                                         +----------+--------+--------+--------+------------------+--------+  CCA Mid   95      24                                         +----------+--------+--------+--------+------------------+--------+ CCA Distal73      18              heterogenous               +----------+--------+--------+--------+------------------+--------+ ICA Prox  85      21              heterogenous               +----------+--------+--------+--------+------------------+--------+ ICA Mid   96      30      1-39%   heterogenous               +----------+--------+--------+--------+------------------+--------+ ICA Distal102     31                                         +----------+--------+--------+--------+------------------+--------+ ECA       206     18              heterogenous               +----------+--------+--------+--------+------------------+--------+  +----------+--------+--------+----------------+-------------------+           PSV cm/sEDV cm/sDescribe        Arm Pressure (mmHG) +----------+--------+--------+----------------+-------------------+ [id number]             Multiphasic, [id number]                 +----------+--------+--------+----------------+-------------------+ +---------+--------+--+--------+--+---------+ VertebralPSV cm/s54EDV cm/s15Antegrade +---------+--------+--+--------+--+---------+ Velocities in the LICA remain within normal range and essentially stable compared to the prior exam.  Summary: Right Carotid: Non-hemodynamically significant plaque <50% noted in the CCA.                Widely patent distal CCA-mid ICA stent without evidence of                stenosis. Left Carotid: Velocities in the left ICA are consistent with a 1-39% stenosis.               Non-hemodynamically significant plaque <50% noted in the CCA. Vertebrals:  Bilateral vertebral arteries demonstrate antegrade flow. Subclavians: Normal flow hemodynamics were seen in bilateral subclavian              arteries. *See table(s) above for measurements and observations. Suggest follow up study in 12 months. Electronically signed by Ida Rogue MD on 08/10/2019 at 5:32:55 PM.    Final     Assessment & Plan:   Walker Kehr, MD

## 2020-07-31 NOTE — Assessment & Plan Note (Signed)
Cont w/Losartan HCT

## 2020-08-08 ENCOUNTER — Other Ambulatory Visit (HOSPITAL_COMMUNITY): Payer: Self-pay | Admitting: Internal Medicine

## 2020-08-08 DIAGNOSIS — I6523 Occlusion and stenosis of bilateral carotid arteries: Secondary | ICD-10-CM

## 2020-08-14 ENCOUNTER — Other Ambulatory Visit (HOSPITAL_COMMUNITY): Payer: Self-pay | Admitting: Cardiology

## 2020-08-14 ENCOUNTER — Other Ambulatory Visit: Payer: Self-pay

## 2020-08-14 ENCOUNTER — Ambulatory Visit (HOSPITAL_COMMUNITY)
Admission: RE | Admit: 2020-08-14 | Discharge: 2020-08-14 | Disposition: A | Discharge status: Home / Self Care | Payer: Medicare Other | Source: Ambulatory Visit | Attending: Cardiovascular Disease | Admitting: Cardiovascular Disease

## 2020-08-14 DIAGNOSIS — Z95828 Presence of other vascular implants and grafts: Secondary | ICD-10-CM

## 2020-08-14 DIAGNOSIS — I6523 Occlusion and stenosis of bilateral carotid arteries: Secondary | ICD-10-CM

## 2020-08-30 DIAGNOSIS — M25512 Pain in left shoulder: Secondary | ICD-10-CM | POA: Diagnosis not present

## 2020-09-06 ENCOUNTER — Other Ambulatory Visit: Payer: Self-pay | Admitting: Internal Medicine

## 2020-09-18 ENCOUNTER — Telehealth: Payer: Self-pay | Admitting: Internal Medicine

## 2020-09-18 NOTE — Telephone Encounter (Signed)
Patient's wife has stated she will call me back to schedule in a few days. Please schedule this appt if pt calls the office.

## 2020-10-11 ENCOUNTER — Other Ambulatory Visit: Payer: Self-pay | Admitting: Internal Medicine
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
## 2020-10-24 ENCOUNTER — Other Ambulatory Visit: Payer: Self-pay

## 2020-10-24 ENCOUNTER — Encounter: Payer: Self-pay | Admitting: Cardiology

## 2020-10-24 ENCOUNTER — Ambulatory Visit (INDEPENDENT_AMBULATORY_CARE_PROVIDER_SITE_OTHER): Payer: Medicare Other | Admitting: Cardiology

## 2020-10-24 VITALS — BP 132/64 | HR 78 | Ht 70.5 in | Wt 196.4 lb

## 2020-10-24 DIAGNOSIS — I10 Essential (primary) hypertension: Secondary | ICD-10-CM

## 2020-10-24 DIAGNOSIS — I6523 Occlusion and stenosis of bilateral carotid arteries: Secondary | ICD-10-CM | POA: Diagnosis not present

## 2020-10-24 DIAGNOSIS — I350 Nonrheumatic aortic (valve) stenosis: Secondary | ICD-10-CM | POA: Diagnosis not present

## 2020-10-24 DIAGNOSIS — E7800 Pure hypercholesterolemia, unspecified: Secondary | ICD-10-CM | POA: Diagnosis not present

## 2020-10-24 NOTE — Patient Instructions (Signed)
  Testing/Procedures:  Your physician has requested that you have an echocardiogram. Echocardiography is a painless test that uses sound waves to create images of your heart. It provides your doctor with information about the size and shape of your heart and how well your heart's chambers and valves are working. This procedure takes approximately one hour. There are no restrictions for this procedure. [place] MARCH 2023   Follow-Up: At [facility], you and your health needs are our priority.  As part of our continuing mission to provide you with exceptional heart care, we have created designated Provider Care Teams.  These Care Teams include your primary Cardiologist (physician) and Advanced Practice Providers (APPs -  Physician Assistants and Nurse Practitioners) who all work together to provide you with the care you need, when you need it.  We recommend signing up for the patient portal called "MyChart".  Sign up information is provided on this After Visit Summary.  MyChart is used to connect with patients for Virtual Visits (Telemedicine).  Patients are able to view lab/test results, encounter notes, upcoming appointments, etc.  Non-urgent messages can be sent to your provider as well.   To learn more about what you can do with MyChart, go to [email or web address].    Your next appointment:   [facility]TER [facility]  The format for your next appointment:   In Person  Provider:   Kirk Ruths, MD

## 2020-10-31 ENCOUNTER — Encounter: Payer: Self-pay | Admitting: Internal Medicine

## 2020-10-31 ENCOUNTER — Ambulatory Visit (INDEPENDENT_AMBULATORY_CARE_PROVIDER_SITE_OTHER): Payer: Medicare Other | Admitting: Internal Medicine

## 2020-10-31 ENCOUNTER — Other Ambulatory Visit: Payer: Self-pay

## 2020-10-31 VITALS — BP 110/48 | HR 71 | Temp 98.5°F | Ht 70.5 in | Wt 194.6 lb

## 2020-10-31 DIAGNOSIS — I6523 Occlusion and stenosis of bilateral carotid arteries: Secondary | ICD-10-CM | POA: Diagnosis not present

## 2020-10-31 DIAGNOSIS — Z23 Encounter for immunization: Secondary | ICD-10-CM

## 2020-10-31 DIAGNOSIS — I10 Essential (primary) hypertension: Secondary | ICD-10-CM | POA: Diagnosis not present

## 2020-10-31 DIAGNOSIS — E1159 Type 2 diabetes mellitus with other circulatory complications: Secondary | ICD-10-CM

## 2020-10-31 DIAGNOSIS — E538 Deficiency of other specified B group vitamins: Secondary | ICD-10-CM

## 2020-10-31 DIAGNOSIS — E785 Hyperlipidemia, unspecified: Secondary | ICD-10-CM | POA: Diagnosis not present

## 2020-10-31 LAB — COMPREHENSIVE METABOLIC PANEL
ALT: 23 U/L (ref 0–53)
AST: 16 U/L (ref 0–37)
Albumin: 4.1 g/dL (ref 3.5–5.2)
Alkaline Phosphatase: 75 U/L (ref 39–117)
BUN: 24 mg/dL — ABNORMAL HIGH (ref 6–23)
CO2: 28 mEq/L (ref 19–32)
Calcium: 9.3 mg/dL (ref 8.4–10.5)
Chloride: 102 mEq/L (ref 96–112)
Creatinine, Ser: 1.43 mg/dL (ref 0.40–1.50)
GFR: 47.06 mL/min — ABNORMAL LOW (ref >60.00)
Glucose, Bld: 270 mg/dL — ABNORMAL HIGH (ref 70–99)
Potassium: 4.1 mEq/L (ref 3.5–5.1)
Sodium: 137 mEq/L (ref 135–145)
Total Bilirubin: 0.7 mg/dL (ref 0.2–1.2)
Total Protein: 6.6 g/dL (ref 6.0–8.3)

## 2020-10-31 LAB — URINALYSIS
Bilirubin Urine: NEGATIVE
Ketones, ur: NEGATIVE
Leukocytes,Ua: NEGATIVE
Nitrite: NEGATIVE
Specific Gravity, Urine: 1.01 (ref 1.000–1.030)
Total Protein, Urine: NEGATIVE
Urine Glucose: >=1000 — AB
Urobilinogen, UA: 0.2 (ref 0.0–1.0)
pH: 6 (ref 5.0–8.0)

## 2020-10-31 LAB — LIPID PANEL
Cholesterol: 110 mg/dL (ref 0–200)
HDL: 27.7 mg/dL — ABNORMAL LOW (ref >39.00)
LDL Cholesterol: 49 mg/dL (ref 0–99)
NonHDL: 82.79
Total CHOL/HDL Ratio: 4
Triglycerides: 169 mg/dL — ABNORMAL HIGH (ref 0.0–149.0)
VLDL: 33.8 mg/dL (ref 0.0–40.0)

## 2020-10-31 LAB — MICROALBUMIN / CREATININE URINE RATIO
Creatinine,U: 109.2 mg/dL
Microalb Creat Ratio: 0.6 mg/g (ref 0.0–30.0)
Microalb, Ur: <0.7 mg/dL (ref 0.0–1.9)

## 2020-10-31 LAB — HEMOGLOBIN A1C: Hgb A1c MFr Bld: 7.7 % — ABNORMAL HIGH (ref 4.6–6.5)

## 2020-10-31 LAB — TSH: TSH: 1.41 u[IU]/mL (ref 0.35–5.50)

## 2020-10-31 MED ORDER — PANTOPRAZOLE SODIUM 40 MG PO TBEC: DELAYED_RELEASE_TABLET | ORAL | 3 refills | Status: DC

## 2020-10-31 NOTE — Assessment & Plan Note (Signed)
On Vit B12 

## 2020-10-31 NOTE — Progress Notes (Signed)
Subjective:  [name]: Andrew Malone., male    DOB: 20-Jun-1940  Age: 80 y.o. MRN: [name]  CC: Follow-up (3 month f/u)   HPI Andrew Boga Sr. presents for DM, HTN, CAD f/u  Outpatient Medications Prior to Visit  Medication Sig Dispense Refill   acyclovir (ZOVIRAX) 200 MG capsule TAKE 1 CAPSULE(200 MG) BY MOUTH TWICE DAILY 180 capsule 3   ammonium lactate (LAC-HYDRIN) 12 % lotion Apply 1 application topically 2 (two) times daily as needed for dry skin.   6   atorvastatin (LIPITOR) 40 MG tablet TAKE 1 TABLET(40 MG) BY MOUTH DAILY 90 tablet 2   bismuth subsalicylate (PEPTO BISMOL) 262 MG/15ML suspension Take 30 mLs by mouth every 6 (six) hours as needed for indigestion or diarrhea or loose stools.     Cholecalciferol 1000 units tablet Take 1,000 Units by mouth daily.     citalopram (CELEXA) 10 MG tablet TAKE 1 TABLET(10 MG) BY MOUTH DAILY 90 tablet 3   clopidogrel (PLAVIX) 75 MG tablet TAKE 1 TABLET(75 MG) BY MOUTH DAILY 90 tablet 3   Cyanocobalamin (VITAMIN B-12) 1000 MCG SUBL Place 1 tablet (1,000 mcg total) under the tongue daily. 100 tablet 3   loratadine (CLARITIN) 10 MG tablet Take 10 mg by mouth at bedtime.     losartan-hydrochlorothiazide (HYZAAR) 50-12.5 MG tablet Take 1 tablet by mouth daily. 90 tablet 3   metFORMIN (GLUCOPHAGE-XR) 500 MG 24 hr tablet Take 1,000 mg by mouth daily with supper.   3   metoprolol succinate (TOPROL-XL) 25 MG 24 hr tablet TAKE 1/2 TABLET BY MOUTH AT BEDTIME 90 tablet 2   pantoprazole (PROTONIX) 40 MG tablet TAKE 1 TABLET(40 MG) BY MOUTH DAILY 90 tablet 3   tamsulosin (FLOMAX) 0.4 MG CAPS capsule TAKE 1 CAPSULE BY MOUTH EVERY NIGHT AT BEDTIME 30 capsule 11   Dulaglutide (TRULICITY) [id number] [id number] SOPN Take 0.75 mg once a week (Patient not taking: Reported on 10/31/2020)     No facility-administered medications prior to visit.    ROS: Review of Systems  Constitutional:  Positive for fatigue. Negative for appetite change and unexpected  weight change.  HENT:  Negative for congestion, nosebleeds, sneezing, sore throat and trouble swallowing.   Eyes:  Negative for itching and visual disturbance.  Respiratory:  Negative for cough.   Cardiovascular:  Negative for chest pain, palpitations and leg swelling.  Gastrointestinal:  Negative for abdominal distention, blood in stool, diarrhea and nausea.  Genitourinary:  Negative for frequency and hematuria.  Musculoskeletal:  Positive for arthralgias and back pain. Negative for gait problem, joint swelling and neck pain.  Skin:  Negative for rash.  Neurological:  Positive for dizziness. Negative for tremors, speech difficulty and weakness.  Psychiatric/Behavioral:  Negative for agitation, dysphoric mood and sleep disturbance. The patient is not nervous/anxious.    Objective:  BP (!) 110/48 (BP Location: Left Arm)   Pulse 71   Temp 98.5 F (36.9 C) (Oral)   Ht 5' 10.5" (1.791 m)   Wt 194 lb 9.6 oz (88.3 kg)   SpO2 95%   BMI 27.53 kg/m   BP Readings from Last 3 Encounters:  10/31/20 (!) 110/48  10/24/20 132/64  07/31/20 (!) 100/52    Wt Readings from Last 3 Encounters:  10/31/20 194 lb 9.6 oz (88.3 kg)  10/24/20 196 lb 6.4 oz (89.1 kg)  07/31/20 192 lb 9.6 oz (87.4 kg)    Physical Exam Constitutional:      General: He is not in  acute distress.    Appearance: He is well-developed.     Comments: NAD  Eyes:     Conjunctiva/sclera: Conjunctivae normal.     Pupils: Pupils are equal, round, and reactive to light.  Neck:     Thyroid: No thyromegaly.     Vascular: No JVD.  Cardiovascular:     Rate and Rhythm: Normal rate and regular rhythm.     Heart sounds: Normal heart sounds. No murmur heard.   No friction rub. No gallop.  Pulmonary:     Effort: Pulmonary effort is normal. No respiratory distress.     Breath sounds: Normal breath sounds. No wheezing or rales.  Chest:     Chest wall: No tenderness.  Abdominal:     General: Bowel sounds are normal. There is no  distension.     Palpations: Abdomen is soft. There is no mass.     Tenderness: There is no abdominal tenderness. There is no guarding or rebound.  Musculoskeletal:        General: No tenderness. Normal range of motion.     Cervical back: Normal range of motion.  Lymphadenopathy:     Cervical: No cervical adenopathy.  Skin:    General: Skin is warm and dry.     Findings: No rash.  Neurological:     Mental Status: He is alert and oriented to person, place, and time.     Cranial Nerves: No cranial nerve deficit.     Motor: No abnormal muscle tone.     Coordination: Coordination normal.     Gait: Gait normal.     Deep Tendon Reflexes: Reflexes are normal and symmetric.  Psychiatric:        Behavior: Behavior normal.        Thought Content: Thought content normal.        Judgment: Judgment normal.    Lab Results  Component Value Date   WBC 5.1 07/26/2019   HGB 11.6 (L) 07/26/2019   HCT 33.9 (L) 07/26/2019   PLT 207.0 07/26/2019   GLUCOSE 133 (H) 01/31/2020   CHOL 122 01/31/2020   TRIG 138.0 01/31/2020   HDL 27.90 (L) 01/31/2020   LDLDIRECT 51.0 07/24/2016   LDLCALC 66 01/31/2020   ALT 29 01/31/2020   AST 18 01/31/2020   NA 137 01/31/2020   K 4.8 01/31/2020   CL 102 01/31/2020   CREATININE 1.35 01/31/2020   BUN 29 (H) 01/31/2020   CO2 30 01/31/2020   TSH 1.92 01/31/2020   PSA 1.75 07/24/2016   INR 1.0 07/13/2018   HGBA1C 7.6 (H) 01/31/2020    VAS US CAROTID  Result Date: 08/14/2020 Carotid Arterial Duplex Study Patient Name:  Andrew BUBIER Sr.  Date of Exam:   08/14/2020 Medical Rec #: [name]            Accession #:    [id number] Date of Birth: Mar 28, 1940            Patient Gender: M Patient Age:   79Y Exam Location:  [facility] Procedure:      VAS US CAROTID Referring Phys: [email or web address] --------------------------------------------------------------------------------  Indications:       Carotid artery disease and Right stent. Risk Factors:       Hypertension, hyperlipidemia, no history of smoking, coronary                    artery disease, prior CVA. Comparison Study:  08/2019-Widely patent right distal CCA-mid ICA stent without  evidence of stenosis; LICA velocity 99991111 cm/s. Performing Technologist: Wilkie Aye RVT  Examination Guidelines: A complete evaluation includes B-mode imaging, spectral Doppler, color Doppler, and power Doppler as needed of all accessible portions of each vessel. Bilateral testing is considered an integral part of a complete examination. Limited examinations for reoccurring indications may be performed as noted.  Right Carotid Findings: +----------+--------+--------+--------+------------------+--------+           PSV cm/sEDV cm/sStenosisPlaque DescriptionComments +----------+--------+--------+--------+------------------+--------+ CCA Prox  56      9                                          +----------+--------+--------+--------+------------------+--------+ CCA Mid   62      12                                         +----------+--------+--------+--------+------------------+--------+ ICA Distal70      21                                         +----------+--------+--------+--------+------------------+--------+ ECA       131     14                                         +----------+--------+--------+--------+------------------+--------+ +----------+--------+-------+----------------+-------------------+           PSV cm/sEDV cmsDescribe        Arm Pressure (mmHG) +----------+--------+-------+----------------+-------------------+ [id number]            Multiphasic, [id number]                 +----------+--------+-------+----------------+-------------------+ +---------+--------+--+--------+-+---------+ VertebralPSV cm/s29EDV cm/s7Antegrade +---------+--------+--+--------+-+---------+  Right Stent(s): +---------------+--------+--------+--------+--------+--------+  CCA/ICA        PSV cm/sEDV cm/sStenosisWaveformComments +---------------+--------+--------+--------+--------+--------+ Prox to Stent  62      12                               +---------------+--------+--------+--------+--------+--------+ Proximal Stent 82      14                               +---------------+--------+--------+--------+--------+--------+ Mid Stent      70      22                               +---------------+--------+--------+--------+--------+--------+ Distal Stent   78      26                               +---------------+--------+--------+--------+--------+--------+ Distal to Stent70      21                               +---------------+--------+--------+--------+--------+--------+  Left Carotid Findings: +----------+--------+--------+--------+--------------------------+--------+           PSV cm/sEDV cm/sStenosisPlaque Description        Comments +----------+--------+--------+--------+--------------------------+--------+  CCA Prox  106     17                                                 +----------+--------+--------+--------+--------------------------+--------+ CCA Distal80      22                                                 +----------+--------+--------+--------+--------------------------+--------+ ICA Prox  87      30      1-39%   heterogenous and irregular         +----------+--------+--------+--------+--------------------------+--------+ ICA Mid   56      20                                                 +----------+--------+--------+--------+--------------------------+--------+ ICA Distal97      30                                                 +----------+--------+--------+--------+--------------------------+--------+ ECA       136     20              heterogenous                       +----------+--------+--------+--------+--------------------------+--------+  +----------+--------+--------+----------------+-------------------+           PSV cm/sEDV cm/sDescribe        Arm Pressure (mmHG) +----------+--------+--------+----------------+-------------------+ [id number]             Multiphasic, [id number]                 +----------+--------+--------+----------------+-------------------+ +---------+--------+--+--------+--+---------+ VertebralPSV cm/s49EDV cm/s14Antegrade +---------+--------+--+--------+--+---------+   Summary: Right Carotid: Widely patent right distal CCA-mid ICA stent without evidence of                stenosis. Left Carotid: Velocities in the left ICA are consistent with a 1-39% stenosis. Vertebrals:  Bilateral vertebral arteries demonstrate antegrade flow. Subclavians: Normal flow hemodynamics were seen in bilateral subclavian              arteries. *See table(s) above for measurements and observations. Suggest follow up study in 12 months. Electronically signed by Quay Burow MD on 08/14/2020 at 12:14:17 PM.    Final     Assessment & Plan:   There are no diagnoses linked to this encounter.   No orders of the defined types were placed in this encounter.    Follow-up: No follow-ups on file.  Andrew Kehr, MD

## 2020-10-31 NOTE — Assessment & Plan Note (Signed)
On Losartan HCT and metoprolol. Reduce Losartan HCT to 1/2 tab a day if low BP

## 2020-10-31 NOTE — Addendum Note (Signed)
Addended by: Earnstine Regal on: 10/31/2020 11:53 AM   Modules accepted: Orders

## 2020-10-31 NOTE — Addendum Note (Signed)
Addended by: Jacobo Forest on: 10/31/2020 11:38 AM   Modules accepted: Orders

## 2020-10-31 NOTE — Assessment & Plan Note (Signed)
On Metformin Check labs

## 2020-11-14 ENCOUNTER — Other Ambulatory Visit: Payer: Self-pay | Admitting: Internal Medicine

## 2020-11-14 DIAGNOSIS — Z1283 Encounter for screening for malignant neoplasm of skin: Secondary | ICD-10-CM | POA: Diagnosis not present

## 2020-11-14 DIAGNOSIS — B078 Other viral warts: Secondary | ICD-10-CM | POA: Diagnosis not present

## 2020-11-14 DIAGNOSIS — L570 Actinic keratosis: Secondary | ICD-10-CM | POA: Diagnosis not present

## 2020-11-14 DIAGNOSIS — D225 Melanocytic nevi of trunk: Secondary | ICD-10-CM | POA: Diagnosis not present

## 2020-11-14 DIAGNOSIS — X32XXXD Exposure to sunlight, subsequent encounter: Secondary | ICD-10-CM | POA: Diagnosis not present

## 2020-12-03 ENCOUNTER — Encounter: Payer: Self-pay | Admitting: Pulmonary Disease

## 2020-12-03 ENCOUNTER — Ambulatory Visit (INDEPENDENT_AMBULATORY_CARE_PROVIDER_SITE_OTHER): Payer: Medicare Other | Admitting: Pulmonary Disease

## 2020-12-03 ENCOUNTER — Other Ambulatory Visit: Payer: Self-pay

## 2020-12-03 VITALS — BP 128/60 | HR 74 | Temp 98.0°F | Ht 70.5 in | Wt 194.1 lb

## 2020-12-03 DIAGNOSIS — G4733 Obstructive sleep apnea (adult) (pediatric): Secondary | ICD-10-CM | POA: Diagnosis not present

## 2020-12-03 DIAGNOSIS — I6523 Occlusion and stenosis of bilateral carotid arteries: Secondary | ICD-10-CM | POA: Diagnosis not present

## 2020-12-03 DIAGNOSIS — J310 Chronic rhinitis: Secondary | ICD-10-CM

## 2020-12-03 MED ORDER — IPRATROPIUM BROMIDE 0.03 % NA SOLN: 1.0000 | Freq: Three times a day (TID) | NASAL | 12 refills | Status: DC | PRN

## 2020-12-03 NOTE — Patient Instructions (Signed)
Will have [employer] refit your CPAP mask  Will have you try atrovent nasal spray up to 3 times per day as needed for runny nose

## 2020-12-03 NOTE — Progress Notes (Signed)
[facility]  Chief Complaint  Patient presents with   Follow-up    Cpap- nose runs and makes compliance hard with cpap. Uses it more in cold weather     Constitutional:  BP 128/60 (BP Location: Left Arm, Patient Position: Sitting)   Pulse 74   Temp 98 F (36.7 C) (Temporal)   Ht 5' 10.5" (1.791 m)   Wt 194 lb 1.9 oz (88.1 kg)   SpO2 98%   BMI 27.46 kg/m   Past Medical History:  CAD, Carotid artery disease, CVA, HLD, HTN, GERD, GN, HH, PUD, chronic back pain, Anxiety, Aortic stenosis, DM  Past Surgical History:  He  has a past surgical history that includes Cardiac catheterization (2007); Coronary angioplasty with stent (07/2011); Inguinal hernia repair (~ 2002); Hernia repair (~ 2002); TEE without cardioversion (08/11/2011); Upper gastrointestinal endoscopy; and Colonoscopy.  Brief Summary:  Andrew Malone. is a 80 y.o. male with obstructive sleep apnea and short sleep cycle.      Subjective:   He is here with his wife.  He has  been getting runny nose.  Happens all day long, especially when he is eating.  Makes it more difficult for him to use CPAP.  Needs new supplies.  Physical Exam:   Appearance - well kempt   ENMT - no sinus tenderness, no oral exudate, no LAN, Mallampati 3 airway, no stridor  Respiratory - equal breath sounds bilaterally, no wheezing or rales  CV - s1s2 regular rate and rhythm, 2/6 murmur  Ext - no clubbing, no edema  Skin - no rashes  Psych - normal mood and affect   Sleep Tests:  PSG 09/14/10>>AHI 8, SpO2 low 84% CPAP 11/02/20 to 12/01/20 >> used on 22 of 30 nights with average 6 hrs 45 min.  Average AHI 1.1 with CPAP 8 cm H2O  Cardiac Tests:  Echo 05/24/20 >> EF 60 to 65%, grade 1 DD, mild LVH, mod AS  Social History:  He  reports that he has never smoked. He quit smokeless tobacco use about 45 years ago.  His smokeless tobacco use included chew. He reports that he does not drink  alcohol and does not use drugs.  Family History:  His family history includes Breast cancer in his sister; Coronary artery disease in his mother; Diabetes in his brother, mother, and sister; Emphysema in his brother; Heart attack in his mother; Hyperlipidemia in his mother and sister; Hypertension in his mother and sister; Lung cancer in his father and sister; Stroke in his mother.     Assessment/Plan:   Obstructive sleep apnea. - he is compliant with CPAP and reports benefit - he uses [employer] for his DME - continue CPAP 8 cm H2O - will have his DME refit his mask  Gustatory and CPAP rhinitis. - will have him try atrovent nasal spray  Aortic stenosis, CAD s/p stent, HTN, HLD. - followed by Dr. Kirk Ruths with [facility]  Time Spent Involved in Patient Care on Day of Examination:  32 minutes  Follow up:   Patient Instructions  Will have [employer] refit your CPAP mask  Will have you try atrovent nasal spray up to 3 times per day as needed for runny nose  Medication List:   Allergies as of 12/03/2020       Reactions   Jardiance [empagliflozin]    Bleeding in the groin   Lisinopril    cough   Trulicity [dulaglutide]  Medication List        Accurate as of December 03, 2020  3:19 PM. If you have any questions, ask your nurse or doctor.          acyclovir 200 MG capsule Commonly known as: ZOVIRAX TAKE 1 CAPSULE(200 MG) BY MOUTH TWICE DAILY   ammonium lactate 12 % lotion Commonly known as: LAC-HYDRIN Apply 1 application topically 2 (two) times daily as needed for dry skin.   atorvastatin 40 MG tablet Commonly known as: LIPITOR TAKE 1 TABLET(40 MG) BY MOUTH DAILY   bismuth subsalicylate 476 LY/65KP suspension Commonly known as: PEPTO BISMOL Take 30 mLs by mouth every 6 (six) hours as needed for indigestion or diarrhea or loose stools.   Cholecalciferol 25 MCG (1000 UT) tablet Take 1,000 Units by mouth daily.   citalopram 10 MG tablet Commonly  known as: CELEXA TAKE 1 TABLET(10 MG) BY MOUTH DAILY   clopidogrel 75 MG tablet Commonly known as: PLAVIX TAKE 1 TABLET(75 MG) BY MOUTH DAILY   ipratropium 0.03 % nasal spray Commonly known as: ATROVENT Place 1 spray into both nostrils 3 (three) times daily as needed for rhinitis. Started by: Chesley Mires, MD   loratadine 10 MG tablet Commonly known as: CLARITIN Take 10 mg by mouth at bedtime.   losartan-hydrochlorothiazide 50-12.5 MG tablet Commonly known as: HYZAAR Take 1 tablet by mouth daily.   metFORMIN 500 MG 24 hr tablet Commonly known as: GLUCOPHAGE-XR Take 1,000 mg by mouth daily with supper.   metoprolol succinate 25 MG 24 hr tablet Commonly known as: TOPROL-XL TAKE 1/2 TABLET BY MOUTH AT BEDTIME   pantoprazole 40 MG tablet Commonly known as: PROTONIX TAKE 1 TABLET(40 MG) BY MOUTH DAILY   tamsulosin 0.4 MG Caps capsule Commonly known as: FLOMAX TAKE 1 CAPSULE BY MOUTH EVERY NIGHT AT BEDTIME   Vitamin B-12 1000 MCG Subl Place 1 tablet (1,000 mcg total) under the tongue daily.        Signature:  Chesley Mires, MD [place] Pager - [phone] 12/03/2020, 3:19 PM

## 2021-01-18 ENCOUNTER — Other Ambulatory Visit: Payer: Self-pay | Admitting: Internal Medicine

## 2021-01-18 DIAGNOSIS — E7800 Pure hypercholesterolemia, unspecified: Secondary | ICD-10-CM

## 2021-03-04 ENCOUNTER — Other Ambulatory Visit: Payer: Self-pay

## 2021-03-04 ENCOUNTER — Telehealth (INDEPENDENT_AMBULATORY_CARE_PROVIDER_SITE_OTHER): Payer: Medicare Other | Admitting: Internal Medicine

## 2021-03-04 ENCOUNTER — Encounter: Payer: Self-pay | Admitting: Internal Medicine

## 2021-03-04 DIAGNOSIS — Z8619 Personal history of other infectious and parasitic diseases: Secondary | ICD-10-CM | POA: Diagnosis not present

## 2021-03-04 DIAGNOSIS — R197 Diarrhea, unspecified: Secondary | ICD-10-CM | POA: Diagnosis not present

## 2021-03-04 MED ORDER — ACYCLOVIR 200 MG PO CAPS: ORAL_CAPSULE | ORAL | 3 refills | Status: DC

## 2021-03-04 NOTE — Progress Notes (Signed)
Virtual Visit via Telephone Note  I connected with Andrew Malone on 03/26/20 at  9:30 AM EST by telephone and verified that I am speaking with the correct person using two identifiers.  Location:  Persons participating in the virtual visit: patient, [name] and provider Patient: home Provider: [facility] Office   I discussed the limitations, risks, security and privacy concerns of performing an evaluation and management service by telephone and the availability of in person appointments. I also discussed with the patient that there may be a patient responsible charge related to this service. The patient expressed understanding and agreed to proceed.  No chief complaint on file.    History of Present Illness: Andrew Malone has been sick with intermittent diarrhea and nausea over past week.  He has been tired.  His symptoms are getting better.  He is taking Imodium A-D as needed Follow-up on cold sores ROS Diarrhea is better  Observations/Objective:   Assessment and Plan:  Problem List Items Addressed This Visit     Diarrhea    Acute on chronic diarrhea.  Use Imodium A-D prn      History of cold sores    Chronic recurrent.  On acyclovir daily.  Acyclovir was renewed        Meds ordered this encounter  Medications   acyclovir (ZOVIRAX) 200 MG capsule    Sig: TAKE 1 CAPSULE(200 MG) BY MOUTH TWICE DAILY    Dispense:  180 capsule    Refill:  3      Follow Up Instructions:    I discussed the assessment and treatment plan with the patient. The patient was provided an opportunity to ask questions and all were answered. The patient agreed with the plan and demonstrated an understanding of the instructions.   The patient was advised to call back or seek an in-person evaluation if the symptoms worsen or if the condition fails to improve as anticipated.  I provided 11 minutes of non-face-to-face time during this encounter.   Walker Kehr, MD

## 2021-03-04 NOTE — Assessment & Plan Note (Signed)
Chronic recurrent.  On acyclovir daily.  Acyclovir was renewed

## 2021-03-04 NOTE — Assessment & Plan Note (Signed)
Acute on chronic diarrhea.  Use Imodium A-D prn

## 2021-04-10 ENCOUNTER — Other Ambulatory Visit: Payer: Self-pay

## 2021-04-10 DIAGNOSIS — I10 Essential (primary) hypertension: Secondary | ICD-10-CM

## 2021-04-10 MED ORDER — LOSARTAN POTASSIUM-HCTZ 50-12.5 MG PO TABS: 1.0000 | ORAL_TABLET | Freq: Every day | ORAL | 3 refills | Status: DC

## 2021-04-16 ENCOUNTER — Other Ambulatory Visit: Payer: Self-pay

## 2021-04-16 ENCOUNTER — Ambulatory Visit (HOSPITAL_COMMUNITY): Payer: Medicare Other | Attending: Internal Medicine

## 2021-04-16 DIAGNOSIS — I350 Nonrheumatic aortic (valve) stenosis: Secondary | ICD-10-CM | POA: Diagnosis not present

## 2021-04-16 LAB — ECHOCARDIOGRAM COMPLETE
AR max vel: 1.25 cm2
AV Area VTI: 1.41 cm2
AV Area mean vel: 1.25 cm2
AV Mean grad: 19 mmHg
AV Peak grad: 33.4 mmHg
Ao pk vel: 2.89 m/s
Area-P 1/2: 3.53 cm2
S' Lateral: 2.3 cm

## 2021-04-17 ENCOUNTER — Other Ambulatory Visit: Payer: Self-pay | Admitting: *Deleted

## 2021-04-17 DIAGNOSIS — I350 Nonrheumatic aortic (valve) stenosis: Secondary | ICD-10-CM

## 2021-04-17 NOTE — Progress Notes (Signed)
echo

## 2021-04-21 ENCOUNTER — Telehealth: Payer: Self-pay | Admitting: Cardiology

## 2021-04-21 ENCOUNTER — Other Ambulatory Visit: Payer: Self-pay

## 2021-04-21 ENCOUNTER — Ambulatory Visit: Payer: Medicare Other

## 2021-04-21 DIAGNOSIS — I10 Essential (primary) hypertension: Secondary | ICD-10-CM

## 2021-04-21 NOTE — Telephone Encounter (Signed)
Have him increase the losartan hctz to 2 tablets daily (he has the 50/12.5 mg tabs).  Also needs to stay hydrated.  Will need a BMET when he sees Dr. Stanford Breed

## 2021-04-21 NOTE — Telephone Encounter (Signed)
Called patient advised of message from [facility].  Patient wife verbalized understanding.

## 2021-04-21 NOTE — Telephone Encounter (Signed)
Called patient, spoke with him and wife. She states that she spoke with Debra on 02/16 in regards to last ECHO results. Patient starting having BP issues during the weekend. BP readings are below. Patient wife states that the only symptoms he is having is feeling tired and not having any energy. She states yesterday his BP was high 171/(unknown) so he took an extra losartan-hctz 1/2 tablet. It brought his BP down to 133/61 HR 73 but it did not stay there long, it went back up. Patient denies any diet changes, or other medication changes. Patient does check BP 1 hour or so after medications, and he checks at different times during the day. Patient is also taking Metoprolol 1/2 tablet daily at bedtime.   I advised I would route to MD,PHARMD to see if any recommendations.   Patient does have appointment on 03/01 with Dr.Crenshaw.   Thank you!

## 2021-04-21 NOTE — Telephone Encounter (Signed)
Pt c/o BP issue: STAT if pt c/o blurred vision, one-sided weakness or slurred speech  1. What are your last 5 BP readings?   147/63 69 161/72 64 133/61 73 (took extra BP medication) 153/65 68 165/65  167/69 69  2. Are you having any other symptoms (ex. Dizziness, headache, blurred vision, passed out)?  No   3. What is your BP issue?   Patient's wife states over the weekend the patient's systolic BP has been in the 160's-170's.

## 2021-04-25 ENCOUNTER — Other Ambulatory Visit: Payer: Self-pay | Admitting: Internal Medicine
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
## 2021-04-30 ENCOUNTER — Ambulatory Visit: Payer: Medicare Other | Admitting: Cardiology

## 2021-05-10 ENCOUNTER — Other Ambulatory Visit: Payer: Self-pay | Admitting: Internal Medicine
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
## 2021-05-19 ENCOUNTER — Other Ambulatory Visit: Payer: Self-pay

## 2021-05-19 ENCOUNTER — Ambulatory Visit (INDEPENDENT_AMBULATORY_CARE_PROVIDER_SITE_OTHER): Payer: Medicare Other | Admitting: Cardiology

## 2021-05-19 ENCOUNTER — Encounter: Payer: Self-pay | Admitting: Cardiology

## 2021-05-19 VITALS — BP 130/62 | HR 69 | Ht 70.5 in | Wt 192.0 lb

## 2021-05-19 DIAGNOSIS — I350 Nonrheumatic aortic (valve) stenosis: Secondary | ICD-10-CM

## 2021-05-19 DIAGNOSIS — I10 Essential (primary) hypertension: Secondary | ICD-10-CM

## 2021-05-19 DIAGNOSIS — I6523 Occlusion and stenosis of bilateral carotid arteries: Secondary | ICD-10-CM

## 2021-05-19 DIAGNOSIS — E7800 Pure hypercholesterolemia, unspecified: Secondary | ICD-10-CM | POA: Diagnosis not present

## 2021-05-19 MED ORDER — LOSARTAN POTASSIUM-HCTZ 100-25 MG PO TABS: 1.0000 | ORAL_TABLET | Freq: Every day | ORAL | 3 refills | Status: DC

## 2021-05-19 NOTE — Patient Instructions (Signed)
°  Follow-Up: °At [facility], you and your health needs are our priority.  As part of our continuing mission to provide you with exceptional heart care, we have created designated Provider Care Teams.  These Care Teams include your primary Cardiologist (physician) and Advanced Practice Providers (APPs -  Physician Assistants and Nurse Practitioners) who all work together to provide you with the care you need, when you need it. ° °We recommend signing up for the patient portal called "MyChart".  Sign up information is provided on this After Visit Summary.  MyChart is used to connect with patients for Virtual Visits (Telemedicine).  Patients are able to view lab/test results, encounter notes, upcoming appointments, etc.  Non-urgent messages can be sent to your provider as well.   °To learn more about what you can do with MyChart, go to [email or web address].   ° °Your next appointment:   °6 month(s) ° °The format for your next appointment:   °In Person ° °Provider:  Brian Crenshaw MD ° °}  ° ° °

## 2021-05-20 ENCOUNTER — Encounter: Payer: Self-pay | Admitting: *Deleted

## 2021-05-20 LAB — BASIC METABOLIC PANEL
BUN/Creatinine Ratio: 20 (ref 10–24)
BUN: 28 mg/dL — ABNORMAL HIGH (ref 8–27)
CO2: 24 mmol/L (ref 20–29)
Calcium: 10.1 mg/dL (ref 8.6–10.2)
Chloride: 101 mmol/L (ref 96–106)
Creatinine, Ser: 1.37 mg/dL — ABNORMAL HIGH (ref 0.76–1.27)
Glucose: 120 mg/dL — ABNORMAL HIGH (ref 70–99)
Potassium: 5.2 mmol/L (ref 3.5–5.2)
Sodium: 140 mmol/L (ref 134–144)
eGFR: 53 mL/min/{1.73_m2} — ABNORMAL LOW (ref >59)

## 2021-06-02 ENCOUNTER — Ambulatory Visit: Payer: Medicare Other | Admitting: Internal Medicine

## 2021-06-13 ENCOUNTER — Ambulatory Visit (INDEPENDENT_AMBULATORY_CARE_PROVIDER_SITE_OTHER): Payer: Medicare Other | Admitting: Internal Medicine

## 2021-06-13 ENCOUNTER — Encounter: Payer: Self-pay | Admitting: Internal Medicine

## 2021-06-13 VITALS — BP 122/60 | HR 41 | Temp 98.1°F | Ht 70.5 in | Wt 184.0 lb

## 2021-06-13 DIAGNOSIS — E1159 Type 2 diabetes mellitus with other circulatory complications: Secondary | ICD-10-CM | POA: Diagnosis not present

## 2021-06-13 DIAGNOSIS — N183 Chronic kidney disease, stage 3 unspecified: Secondary | ICD-10-CM

## 2021-06-13 DIAGNOSIS — R634 Abnormal weight loss: Secondary | ICD-10-CM | POA: Diagnosis not present

## 2021-06-13 DIAGNOSIS — R197 Diarrhea, unspecified: Secondary | ICD-10-CM | POA: Diagnosis not present

## 2021-06-13 DIAGNOSIS — I6523 Occlusion and stenosis of bilateral carotid arteries: Secondary | ICD-10-CM | POA: Diagnosis not present

## 2021-06-13 LAB — CBC WITH DIFFERENTIAL/PLATELET
Basophils Absolute: 0 10*3/uL (ref 0.0–0.1)
Basophils Relative: 0.4 % (ref 0.0–3.0)
Eosinophils Absolute: 0.2 10*3/uL (ref 0.0–0.7)
Eosinophils Relative: 3.2 % (ref 0.0–5.0)
HCT: 35.4 % — ABNORMAL LOW (ref 39.0–52.0)
Hemoglobin: 12.2 g/dL — ABNORMAL LOW (ref 13.0–17.0)
Lymphocytes Relative: 26.1 % (ref 12.0–46.0)
Lymphs Abs: 1.5 10*3/uL (ref 0.7–4.0)
MCHC: 34.4 g/dL (ref 30.0–36.0)
MCV: 85.9 fl (ref 78.0–100.0)
Monocytes Absolute: 0.6 10*3/uL (ref 0.1–1.0)
Monocytes Relative: 9.9 % (ref 3.0–12.0)
Neutro Abs: 3.5 10*3/uL (ref 1.4–7.7)
Neutrophils Relative %: 60.4 % (ref 43.0–77.0)
Platelets: 225 10*3/uL (ref 150.0–400.0)
RBC: 4.13 Mil/uL — ABNORMAL LOW (ref 4.22–5.81)
RDW: 13.5 % (ref 11.5–15.5)
WBC: 5.9 10*3/uL (ref 4.0–10.5)

## 2021-06-13 LAB — HEMOGLOBIN A1C: Hgb A1c MFr Bld: 7.6 % — ABNORMAL HIGH (ref 4.6–6.5)

## 2021-06-13 LAB — COMPREHENSIVE METABOLIC PANEL
ALT: 23 U/L (ref 0–53)
AST: 16 U/L (ref 0–37)
Albumin: 4.3 g/dL (ref 3.5–5.2)
Alkaline Phosphatase: 80 U/L (ref 39–117)
BUN: 28 mg/dL — ABNORMAL HIGH (ref 6–23)
CO2: 30 mEq/L (ref 19–32)
Calcium: 9.9 mg/dL (ref 8.4–10.5)
Chloride: 101 mEq/L (ref 96–112)
Creatinine, Ser: 1.24 mg/dL (ref 0.40–1.50)
GFR: 55.6 mL/min — ABNORMAL LOW (ref >60.00)
Glucose, Bld: 128 mg/dL — ABNORMAL HIGH (ref 70–99)
Potassium: 3.9 mEq/L (ref 3.5–5.1)
Sodium: 137 mEq/L (ref 135–145)
Total Bilirubin: 1 mg/dL (ref 0.2–1.2)
Total Protein: 6.9 g/dL (ref 6.0–8.3)

## 2021-06-13 LAB — SEDIMENTATION RATE: Sed Rate: 16 mm/hr (ref 0–20)

## 2021-06-13 LAB — TSH: TSH: 1.66 u[IU]/mL (ref 0.35–5.50)

## 2021-06-13 MED ORDER — METRONIDAZOLE 500 MG PO TABS: 500.0000 mg | ORAL_TABLET | Freq: Three times a day (TID) | ORAL | 0 refills | Status: DC

## 2021-06-13 MED ORDER — ACYCLOVIR 200 MG PO CAPS: ORAL_CAPSULE | ORAL | 3 refills | Status: DC

## 2021-06-13 MED ORDER — DIPHENOXYLATE-ATROPINE 2.5-0.025 MG PO TABS: 1.0000 | ORAL_TABLET | Freq: Four times a day (QID) | ORAL | 0 refills | Status: DC | PRN

## 2021-06-13 NOTE — Assessment & Plan Note (Signed)
Worse ?Lomotil prn. Consider steroids po ?Labs, stool tests ?GI ref - Dr Henrene Pastor ?

## 2021-06-13 NOTE — Progress Notes (Signed)
? ?Subjective:  ?[name]: Denzel Etienne., male    DOB: 19-Oct-1940  Age: 81 y.o. MRN: [id number] ? ?CC: 3 month f/u (Pt mentioned that he has been feeling bad, he feels fatigued, bad diarrhea for the last 3 weeks. He has been taking otc imodium. ) ? ? ?HPI ?[name] presents for fatigue, wt loss x 1 month. ?C/o diarrhea 10-12 loose stools/d x 1 months, wt loss ?Imodium helps a little. Loose stools are foul smelling, w/o mucus. ? ?Outpatient Medications Prior to Visit  ?Medication Sig Dispense Refill  ? ammonium lactate (LAC-HYDRIN) 12 % lotion Apply 1 application topically 2 (two) times daily as needed for dry skin.   6  ? atorvastatin (LIPITOR) 40 MG tablet TAKE 1 TABLET(40 MG) BY MOUTH DAILY 90 tablet 2  ? Cholecalciferol 1000 units tablet Take 1,000 Units by mouth daily.    ? citalopram (CELEXA) 10 MG tablet TAKE 1 TABLET(10 MG) BY MOUTH DAILY 90 tablet 3  ? clopidogrel (PLAVIX) 75 MG tablet TAKE 1 TABLET(75 MG) BY MOUTH DAILY 90 tablet 3  ? Cyanocobalamin (VITAMIN B-12) 1000 MCG SUBL Place 1 tablet (1,000 mcg total) under the tongue daily. 100 tablet 3  ? ipratropium (ATROVENT) 0.03 % nasal spray Place 1 spray into both nostrils 3 (three) times daily as needed for rhinitis. 30 mL 12  ? loratadine (CLARITIN) 10 MG tablet Take 10 mg by mouth at bedtime.    ? losartan-hydrochlorothiazide (HYZAAR) 100-25 MG tablet Take 1 tablet by mouth daily. 90 tablet 3  ? metFORMIN (GLUCOPHAGE-XR) 500 MG 24 hr tablet Take 1,000 mg by mouth daily with supper.   3  ? metoprolol succinate (TOPROL-XL) 25 MG 24 hr tablet TAKE 1/2 TABLET BY MOUTH AT BEDTIME 90 tablet 2  ? pantoprazole (PROTONIX) 40 MG tablet TAKE 1 TABLET(40 MG) BY MOUTH DAILY 90 tablet 3  ? tamsulosin (FLOMAX) 0.4 MG CAPS capsule TAKE 1 CAPSULE BY MOUTH EVERY NIGHT AT BEDTIME 90 capsule 0  ? bismuth subsalicylate (PEPTO BISMOL) 262 MG/15ML suspension Take 30 mLs by mouth every 6 (six) hours as needed for indigestion or diarrhea or loose stools.  (Patient not taking: Reported on 06/13/2021)    ? acyclovir (ZOVIRAX) 200 MG capsule TAKE 1 CAPSULE(200 MG) BY MOUTH TWICE DAILY (Patient not taking: Reported on 06/13/2021) 180 capsule 3  ? ?No facility-administered medications prior to visit.  ? ? ?ROS: ?Review of Systems  ?Constitutional:  Positive for unexpected weight change. Negative for appetite change and fatigue.  ?HENT:  Negative for congestion, nosebleeds, sneezing, sore throat and trouble swallowing.   ?Eyes:  Negative for itching and visual disturbance.  ?Respiratory:  Negative for cough.   ?Cardiovascular:  Negative for chest pain, palpitations and leg swelling.  ?Gastrointestinal:  Positive for diarrhea. Negative for abdominal distention, blood in stool and nausea.  ?Genitourinary:  Negative for frequency and hematuria.  ?Musculoskeletal:  Negative for back pain, gait problem, joint swelling and neck pain.  ?Skin:  Negative for rash.  ?Neurological:  Negative for dizziness, tremors, speech difficulty and weakness.  ?Psychiatric/Behavioral:  Negative for agitation, dysphoric mood, sleep disturbance and suicidal ideas. The patient is not nervous/anxious.   ? ?Objective:  ?BP 122/60   Pulse (!) 41   Temp 98.1 ?F (36.7 ?C) (Oral)   Ht 5' 10.5" (1.791 m)   Wt 184 lb (83.5 kg)   SpO2 100%   BMI 26.03 kg/m?  ? ?BP Readings from Last 3 Encounters:  ?06/13/21 122/60  ?05/19/21  130/62  ?12/03/20 128/60  ? ? ?Wt Readings from Last 3 Encounters:  ?06/13/21 184 lb (83.5 kg)  ?05/19/21 192 lb (87.1 kg)  ?12/03/20 194 lb 1.9 oz (88.1 kg)  ? ? ?Physical Exam ?Constitutional:   ?   General: He is not in acute distress. ?   Appearance: He is well-developed.  ?   Comments: NAD  ?Eyes:  ?   Conjunctiva/sclera: Conjunctivae normal.  ?   Pupils: Pupils are equal, round, and reactive to light.  ?Neck:  ?   Thyroid: No thyromegaly.  ?   Vascular: No JVD.  ?Cardiovascular:  ?   Rate and Rhythm: Normal rate and regular rhythm.  ?   Heart sounds: Murmur heard.  ?  No  friction rub. No gallop.  ?Pulmonary:  ?   Effort: Pulmonary effort is normal. No respiratory distress.  ?   Breath sounds: Normal breath sounds. No wheezing or rales.  ?Chest:  ?   Chest wall: No tenderness.  ?Abdominal:  ?   General: Bowel sounds are normal. There is no distension.  ?   Palpations: Abdomen is soft. There is no mass.  ?   Tenderness: There is no abdominal tenderness. There is no guarding or rebound.  ?Musculoskeletal:     ?   General: No tenderness. Normal range of motion.  ?   Cervical back: Normal range of motion.  ?Lymphadenopathy:  ?   Cervical: No cervical adenopathy.  ?Skin: ?   General: Skin is warm and dry.  ?   Findings: No rash.  ?Neurological:  ?   Mental Status: He is alert and oriented to person, place, and time.  ?   Cranial Nerves: No cranial nerve deficit.  ?   Motor: No abnormal muscle tone.  ?   Coordination: Coordination normal.  ?   Gait: Gait normal.  ?   Deep Tendon Reflexes: Reflexes are normal and symmetric.  ?Psychiatric:     ?   Behavior: Behavior normal.     ?   Thought Content: Thought content normal.     ?   Judgment: Judgment normal.  ? ? ?Lab Results  ?Component Value Date  ? WBC 5.1 07/26/2019  ? HGB 11.6 (L) 07/26/2019  ? HCT 33.9 (L) 07/26/2019  ? PLT 207.0 07/26/2019  ? GLUCOSE 120 (H) 05/19/2021  ? CHOL 110 10/31/2020  ? TRIG 169.0 (H) 10/31/2020  ? HDL 27.70 (L) 10/31/2020  ? LDLDIRECT 51.0 07/24/2016  ? LDLCALC 49 10/31/2020  ? ALT 23 10/31/2020  ? AST 16 10/31/2020  ? NA 140 05/19/2021  ? K 5.2 05/19/2021  ? CL 101 05/19/2021  ? CREATININE 1.37 (H) 05/19/2021  ? BUN 28 (H) 05/19/2021  ? CO2 24 05/19/2021  ? TSH 1.41 10/31/2020  ? PSA 1.75 07/24/2016  ? INR 1.0 07/13/2018  ? HGBA1C 7.7 (H) 10/31/2020  ? MICROALBUR <0.7 10/31/2020  ? ? ?VAS US CAROTID ? ?Result Date: 08/14/2020 ?Carotid Arterial Duplex Study Patient Name:  VERDIE BARROWS Sr.  Date of Exam:   08/14/2020 Medical Rec #: [id number]            Accession #:    [id number] Date of Birth: 1940/04/13             Patient Gender: M Patient Age:   79Y Exam Location:  [facility] Procedure:      VAS US CAROTID Referring Phys: [place] --------------------------------------------------------------------------------  Indications:       Carotid artery disease  and Right stent. Risk Factors:      Hypertension, hyperlipidemia, no history of smoking, coronary                    artery disease, prior CVA. Comparison Study:  08/2019-Widely patent right distal CCA-mid ICA stent without                    evidence of stenosis; LICA velocity 94/17 cm/s. Performing Technologist: Wilkie Aye RVT  Examination Guidelines: A complete evaluation includes B-mode imaging, spectral Doppler, color Doppler, and power Doppler as needed of all accessible portions of each vessel. Bilateral testing is considered an integral part of a complete examination. Limited examinations for reoccurring indications may be performed as noted.  Right Carotid Findings: +----------+--------+--------+--------+------------------+--------+           PSV cm/sEDV cm/sStenosisPlaque DescriptionComments +----------+--------+--------+--------+------------------+--------+ CCA Prox  56      9                                          +----------+--------+--------+--------+------------------+--------+ CCA Mid   62      12                                         +----------+--------+--------+--------+------------------+--------+ ICA Distal70      21                                         +----------+--------+--------+--------+------------------+--------+ ECA       131     14                                         +----------+--------+--------+--------+------------------+--------+ +----------+--------+-------+----------------+-------------------+           PSV cm/sEDV cmsDescribe        Arm Pressure (mmHG) +----------+--------+-------+----------------+-------------------+ [id number]            Multiphasic, [id number]                  +----------+--------+-------+----------------+-------------------+ +---------+--------+--+--------+-+---------+ VertebralPSV cm/s29EDV cm/s7Antegrade +---------+--------+--+--------+-+---------+  Right Stent(s): +---------------+--------+--------+---

## 2021-06-13 NOTE — Assessment & Plan Note (Signed)
Due to diarrhea ?Treat diarrhea ?TSH, other labs ? ?

## 2021-06-13 NOTE — Assessment & Plan Note (Signed)
Check GFR Hydrate well 

## 2021-06-13 NOTE — Assessment & Plan Note (Signed)
Check A1c. 

## 2021-06-16 ENCOUNTER — Ambulatory Visit: Payer: Medicare Other

## 2021-06-16 DIAGNOSIS — R197 Diarrhea, unspecified: Secondary | ICD-10-CM | POA: Diagnosis not present
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
## 2021-06-17 ENCOUNTER — Encounter: Payer: Self-pay | Admitting: Urology

## 2021-06-17 ENCOUNTER — Ambulatory Visit (INDEPENDENT_AMBULATORY_CARE_PROVIDER_SITE_OTHER): Payer: Medicare Other | Admitting: Urology

## 2021-06-17 VITALS — BP 100/56 | HR 75

## 2021-06-17 DIAGNOSIS — N5201 Erectile dysfunction due to arterial insufficiency: Secondary | ICD-10-CM

## 2021-06-17 DIAGNOSIS — R350 Frequency of micturition: Secondary | ICD-10-CM | POA: Diagnosis not present

## 2021-06-17 DIAGNOSIS — I6523 Occlusion and stenosis of bilateral carotid arteries: Secondary | ICD-10-CM | POA: Diagnosis not present

## 2021-06-17 DIAGNOSIS — N401 Enlarged prostate with lower urinary tract symptoms: Secondary | ICD-10-CM | POA: Diagnosis not present

## 2021-06-17 DIAGNOSIS — R197 Diarrhea, unspecified: Secondary | ICD-10-CM | POA: Diagnosis not present

## 2021-06-17 DIAGNOSIS — R3915 Urgency of urination: Secondary | ICD-10-CM

## 2021-06-17 LAB — URINALYSIS, ROUTINE W REFLEX MICROSCOPIC
Bilirubin, UA: NEGATIVE
Glucose, UA: NEGATIVE
Ketones, UA: NEGATIVE
Nitrite, UA: NEGATIVE
Protein,UA: NEGATIVE
Specific Gravity, UA: 1.02 (ref 1.005–1.030)
Urobilinogen, Ur: 0.2 mg/dL (ref 0.2–1.0)
pH, UA: 5.5 (ref 5.0–7.5)

## 2021-06-17 LAB — MICROSCOPIC EXAMINATION
Bacteria, UA: NONE SEEN
Renal Epithel, UA: NONE SEEN /hpf

## 2021-06-17 MED ORDER — TAMSULOSIN HCL 0.4 MG PO CAPS: 0.4000 mg | ORAL_CAPSULE | Freq: Every day | ORAL | 3 refills | Status: DC

## 2021-06-18 LAB — GIARDIA AND CRYPTOSPORIDIUM ANTIGEN PANEL: Result:: NOT DETECTED

## 2021-06-18 LAB — SPECIMEN STATUS REPORT

## 2021-06-18 LAB — CLOSTRIDIUM DIFFICILE EIA: C difficile Toxins A+B, EIA: NEGATIVE

## 2021-06-18 LAB — FECAL LACTOFERRIN, QUANT: LACTOFERRIN, QL, STOOL: NEGATIVE

## 2021-07-03 ENCOUNTER — Ambulatory Visit (INDEPENDENT_AMBULATORY_CARE_PROVIDER_SITE_OTHER): Payer: Medicare Other | Admitting: Internal Medicine

## 2021-07-03 ENCOUNTER — Encounter: Payer: Self-pay | Admitting: Internal Medicine

## 2021-07-03 DIAGNOSIS — R197 Diarrhea, unspecified: Secondary | ICD-10-CM | POA: Diagnosis not present

## 2021-07-03 DIAGNOSIS — I6523 Occlusion and stenosis of bilateral carotid arteries: Secondary | ICD-10-CM | POA: Diagnosis not present

## 2021-07-03 MED ORDER — BUDESONIDE ER 9 MG PO TB24
ORAL_TABLET | ORAL | 1 refills | Status: DC
Start: 2021-07-03 — End: 2021-07-16

## 2021-07-03 NOTE — Assessment & Plan Note (Signed)
Will start Budesonide ER 9 mg po if diarrhea is worse ?GI ref - Dr Henrene Pastor ?Labs, stool tests reviewed ?

## 2021-07-03 NOTE — Progress Notes (Signed)
? ?Subjective:  ?[name]: Kyian Obst., male    DOB: 1940-06-19  Age: 81 y.o. MRN: [id number] ? ?CC: No chief complaint on file. ? ? ?HPI ?[name] presents for chronic diarrhea, wt loss. Better after Flagyl. ? ?Outpatient Medications Prior to Visit  ?Medication Sig Dispense Refill  ? acyclovir (ZOVIRAX) 200 MG capsule TAKE 1 CAPSULE(200 MG) BY MOUTH TWICE DAILY 180 capsule 3  ? ammonium lactate (LAC-HYDRIN) 12 % lotion Apply 1 application topically 2 (two) times daily as needed for dry skin.   6  ? atorvastatin (LIPITOR) 40 MG tablet TAKE 1 TABLET(40 MG) BY MOUTH DAILY 90 tablet 2  ? bismuth subsalicylate (PEPTO BISMOL) 262 MG/15ML suspension Take 30 mLs by mouth every 6 (six) hours as needed for indigestion or diarrhea or loose stools.    ? Cholecalciferol 1000 units tablet Take 1,000 Units by mouth daily.    ? citalopram (CELEXA) 10 MG tablet TAKE 1 TABLET(10 MG) BY MOUTH DAILY 90 tablet 3  ? clopidogrel (PLAVIX) 75 MG tablet TAKE 1 TABLET(75 MG) BY MOUTH DAILY 90 tablet 3  ? Cyanocobalamin (VITAMIN B-12) 1000 MCG SUBL Place 1 tablet (1,000 mcg total) under the tongue daily. 100 tablet 3  ? diphenoxylate-atropine (LOMOTIL) 2.5-0.025 MG tablet Take 1 tablet by mouth 4 (four) times daily as needed for diarrhea or loose stools. 60 tablet 0  ? ipratropium (ATROVENT) 0.03 % nasal spray Place 1 spray into both nostrils 3 (three) times daily as needed for rhinitis. 30 mL 12  ? loratadine (CLARITIN) 10 MG tablet Take 10 mg by mouth at bedtime.    ? losartan-hydrochlorothiazide (HYZAAR) 100-25 MG tablet Take 1 tablet by mouth daily. 90 tablet 3  ? metFORMIN (GLUCOPHAGE-XR) 500 MG 24 hr tablet Take 1,000 mg by mouth daily with supper.   3  ? metoprolol succinate (TOPROL-XL) 25 MG 24 hr tablet TAKE 1/2 TABLET BY MOUTH AT BEDTIME 90 tablet 2  ? metroNIDAZOLE (FLAGYL) 500 MG tablet Take 1 tablet (500 mg total) by mouth 3 (three) times daily. 30 tablet 0  ? pantoprazole (PROTONIX) 40 MG tablet TAKE 1  TABLET(40 MG) BY MOUTH DAILY 90 tablet 3  ? tamsulosin (FLOMAX) 0.4 MG CAPS capsule Take 1 capsule (0.4 mg total) by mouth at bedtime. 90 capsule 3  ? ?No facility-administered medications prior to visit.  ? ? ?ROS: ?Review of Systems  ?Constitutional:  Negative for appetite change, fatigue and unexpected weight change.  ?HENT:  Negative for congestion, nosebleeds, sneezing, sore throat and trouble swallowing.   ?Eyes:  Negative for itching and visual disturbance.  ?Respiratory:  Negative for cough.   ?Cardiovascular:  Negative for chest pain, palpitations and leg swelling.  ?Gastrointestinal:  Positive for diarrhea. Negative for abdominal distention, blood in stool and nausea.  ?Genitourinary:  Negative for frequency and hematuria.  ?Musculoskeletal:  Negative for back pain, gait problem, joint swelling and neck pain.  ?Skin:  Negative for rash.  ?Neurological:  Negative for dizziness, tremors, speech difficulty and weakness.  ?Psychiatric/Behavioral:  Negative for agitation, dysphoric mood and sleep disturbance. The patient is not nervous/anxious.   ? ?Objective:  ?BP 110/70 (BP Location: Left Arm, Patient Position: Sitting, Cuff Size: Normal)   Pulse 64   Temp 97.9 ?F (36.6 ?C) (Oral)   Ht 5' 10.5" (1.791 m)   Wt 185 lb (83.9 kg)   SpO2 90%   BMI 26.17 kg/m?  ? ?BP Readings from Last 3 Encounters:  ?07/03/21 110/70  ?06/17/21 Marland Kitchen)  100/56  ?06/13/21 122/60  ? ? ?Wt Readings from Last 3 Encounters:  ?07/03/21 185 lb (83.9 kg)  ?06/13/21 184 lb (83.5 kg)  ?05/19/21 192 lb (87.1 kg)  ? ? ?Physical Exam ?Constitutional:   ?   General: He is not in acute distress. ?   Appearance: Normal appearance. He is well-developed.  ?   Comments: NAD  ?Eyes:  ?   Conjunctiva/sclera: Conjunctivae normal.  ?   Pupils: Pupils are equal, round, and reactive to light.  ?Neck:  ?   Thyroid: No thyromegaly.  ?   Vascular: No JVD.  ?Cardiovascular:  ?   Rate and Rhythm: Normal rate and regular rhythm.  ?   Heart sounds: Normal heart  sounds. No murmur heard. ?  No friction rub. No gallop.  ?Pulmonary:  ?   Effort: Pulmonary effort is normal. No respiratory distress.  ?   Breath sounds: Normal breath sounds. No wheezing or rales.  ?Chest:  ?   Chest wall: No tenderness.  ?Abdominal:  ?   General: Bowel sounds are normal. There is no distension.  ?   Palpations: Abdomen is soft. There is no mass.  ?   Tenderness: There is no abdominal tenderness. There is no guarding or rebound.  ?Musculoskeletal:     ?   General: No tenderness. Normal range of motion.  ?   Cervical back: Normal range of motion.  ?Lymphadenopathy:  ?   Cervical: No cervical adenopathy.  ?Skin: ?   General: Skin is warm and dry.  ?   Findings: No rash.  ?Neurological:  ?   Mental Status: He is alert and oriented to person, place, and time.  ?   Cranial Nerves: No cranial nerve deficit.  ?   Motor: No abnormal muscle tone.  ?   Coordination: Coordination normal.  ?   Gait: Gait normal.  ?   Deep Tendon Reflexes: Reflexes are normal and symmetric.  ?Psychiatric:     ?   Behavior: Behavior normal.     ?   Thought Content: Thought content normal.     ?   Judgment: Judgment normal.  ? ? ?Lab Results  ?Component Value Date  ? WBC 5.9 06/13/2021  ? HGB 12.2 (L) 06/13/2021  ? HCT 35.4 (L) 06/13/2021  ? PLT 225.0 06/13/2021  ? GLUCOSE 128 (H) 06/13/2021  ? CHOL 110 10/31/2020  ? TRIG 169.0 (H) 10/31/2020  ? HDL 27.70 (L) 10/31/2020  ? LDLDIRECT 51.0 07/24/2016  ? LDLCALC 49 10/31/2020  ? ALT 23 06/13/2021  ? AST 16 06/13/2021  ? NA 137 06/13/2021  ? K 3.9 06/13/2021  ? CL 101 06/13/2021  ? CREATININE 1.24 06/13/2021  ? BUN 28 (H) 06/13/2021  ? CO2 30 06/13/2021  ? TSH 1.66 06/13/2021  ? PSA 1.75 07/24/2016  ? INR 1.0 07/13/2018  ? HGBA1C 7.6 (H) 06/13/2021  ? MICROALBUR <0.7 10/31/2020  ? ? ?VAS US CAROTID ? ?Result Date: 08/14/2020 ?Carotid Arterial Duplex Study Patient Name:  CARTRELL BENTSEN Sr.  Date of Exam:   08/14/2020 Medical Rec #: [id number]            Accession #:    [id number] Date  of Birth: 11-13-1940            Patient Gender: M Patient Age:   79Y Exam Location:  [facility] Procedure:      VAS US CAROTID Referring Phys: Rudy --------------------------------------------------------------------------------  Indications:       Carotid  artery disease and Right stent. Risk Factors:      Hypertension, hyperlipidemia, no history of smoking, coronary                    artery disease, prior CVA. Comparison Study:  08/2019-Widely patent right distal CCA-mid ICA stent without                    evidence of stenosis; LICA velocity 25/49 cm/s. Performing Technologist: Wilkie Aye RVT  Examination Guidelines: A complete evaluation includes B-mode imaging, spectral Doppler, color Doppler, and power Doppler as needed of all accessible portions of each vessel. Bilateral testing is considered an integral part of a complete examination. Limited examinations for reoccurring indications may be performed as noted.  Right Carotid Findings: +----------+--------+--------+--------+------------------+--------+           PSV cm/sEDV cm/sStenosisPlaque DescriptionComments +----------+--------+--------+--------+------------------+--------+ CCA Prox  56      9                                          +----------+--------+--------+--------+------------------+--------+ CCA Mid   62      12                                         +----------+--------+--------+--------+------------------+--------+ ICA Distal70      21                                         +----------+--------+--------+--------+------------------+--------+ ECA       131     14                                         +----------+--------+--------+--------+------------------+--------+ +----------+--------+-------+----------------+-------------------+           PSV cm/sEDV cmsDescribe        Arm Pressure (mmHG) +----------+--------+-------+----------------+-------------------+ [id number]             Multiphasic, [id number]                 +----------+--------+-------+----------------+-------------------+ +---------+--------+--+--------+-+---------+ VertebralPSV cm/s29EDV cm/s7Antegrade +---------+--------+--+--------+-+---------+  Right Stent(s): +----

## 2021-07-08 DIAGNOSIS — H524 Presbyopia: Secondary | ICD-10-CM | POA: Diagnosis not present

## 2021-07-08 DIAGNOSIS — E119 Type 2 diabetes mellitus without complications: Secondary | ICD-10-CM | POA: Diagnosis not present

## 2021-07-08 DIAGNOSIS — Z961 Presence of intraocular lens: Secondary | ICD-10-CM | POA: Diagnosis not present

## 2021-07-08 DIAGNOSIS — H52203 Unspecified astigmatism, bilateral: Secondary | ICD-10-CM | POA: Diagnosis not present

## 2021-07-15 DIAGNOSIS — I779 Disorder of arteries and arterioles, unspecified: Secondary | ICD-10-CM | POA: Diagnosis not present

## 2021-07-15 DIAGNOSIS — I10 Essential (primary) hypertension: Secondary | ICD-10-CM | POA: Diagnosis not present

## 2021-07-15 DIAGNOSIS — E1142 Type 2 diabetes mellitus with diabetic polyneuropathy: Secondary | ICD-10-CM | POA: Diagnosis not present

## 2021-07-16 ENCOUNTER — Encounter: Payer: Self-pay | Admitting: Internal Medicine

## 2021-07-16 ENCOUNTER — Ambulatory Visit (INDEPENDENT_AMBULATORY_CARE_PROVIDER_SITE_OTHER): Payer: Medicare Other | Admitting: Internal Medicine

## 2021-07-16 VITALS — BP 104/54 | HR 76 | Ht 70.5 in | Wt 189.1 lb

## 2021-07-16 DIAGNOSIS — R197 Diarrhea, unspecified: Secondary | ICD-10-CM | POA: Diagnosis not present

## 2021-07-16 DIAGNOSIS — I6523 Occlusion and stenosis of bilateral carotid arteries: Secondary | ICD-10-CM | POA: Diagnosis not present

## 2021-07-16 DIAGNOSIS — Z7902 Long term (current) use of antithrombotics/antiplatelets: Secondary | ICD-10-CM

## 2021-07-16 DIAGNOSIS — Z86010 Personal history of colonic polyps: Secondary | ICD-10-CM | POA: Diagnosis not present

## 2021-07-16 MED ORDER — NA SULFATE-K SULFATE-MG SULF 17.5-3.13-1.6 GM/177ML PO SOLN: 1.0000 | Freq: Once | ORAL | 0 refills | Status: AC

## 2021-07-16 NOTE — Patient Instructions (Signed)
If you are age 79 or older, your body mass index should be between 23-30. Your Body mass index is 26.75 kg/m?Marland Kitchen If this is out of the aforementioned range listed, please consider follow up with your Primary Care Provider. ? ?If you are age 18 or younger, your body mass index should be between 19-25. Your Body mass index is 26.75 kg/m?Marland Kitchen If this is out of the aformentioned range listed, please consider follow up with your Primary Care Provider.  ? ?________________________________________________________ ? ?The [facility] providers would like to encourage you to use [facility] to communicate with providers for non-urgent requests or questions.  Due to long hold times on the telephone, sending your provider a message by [facility] may be a faster and more efficient way to get a response.  Please allow 48 business hours for a response.  Please remember that this is for non-urgent requests.  ?_______________________________________________________ ? ?You have been scheduled for a colonoscopy. Please follow written instructions given to you at your visit today.  ?Please pick up your prep supplies at the pharmacy within the next 1-3 days. ?If you use inhalers (even only as needed), please bring them with you on the day of your procedure. ? ?
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
## 2021-08-04 ENCOUNTER — Telehealth: Payer: Self-pay | Admitting: Cardiology

## 2021-08-04 NOTE — Telephone Encounter (Signed)
Returned call to patient's wife.   This weekend he was outside planting tomatoes plants one day it was hot - she repots air quality index was also orange. She reports he came in and then was not responding well, leaning to left side. She reports it was about 2 minutes before he spoke to her. She said he was disoriented. Wife states he was well hydrated, drinks water often. She reports no LOC, no slurred speech. He didn't remember incident initially but then able to recall.  She reports resolution of symptoms since. Appetite is good. Only complains of weakness.  Patient is in no acute distress at present time and no acute issues noted  BP today 119/65  HR today 66 ** pretty normal readings for him   Advised will notify MD of concerns and let her know of any recommendations from cardiac standpoint Carotid doppler scheduled for 6/15

## 2021-08-04 NOTE — Telephone Encounter (Signed)
Spoke with patient's wife - relayed message from MD She said patient would only want to see Dr. Stanford Breed but no appointments available for 1-2+ months Advised can offer visit with APP Patient agreed w/this - scheduled on 6/20 @ 10:55am with Raquel Sarna NP

## 2021-08-04 NOTE — Telephone Encounter (Signed)
Pt's wife states that over the weekend pt became disoriented and began leaning to one side. Wife says that pt was doing ok this morning but has become weak. She would like a call back from nurse. Please advise

## 2021-08-04 NOTE — Telephone Encounter (Signed)
Lelon Perla, MD  You 36 minutes ago (3:30 PM)   Await fu carotid dopplers; fu APPov when available  Kirk Ruths

## 2021-08-06 ENCOUNTER — Other Ambulatory Visit: Payer: Self-pay | Admitting: Internal Medicine

## 2021-08-06 DIAGNOSIS — N401 Enlarged prostate with lower urinary tract symptoms: Secondary | ICD-10-CM

## 2021-08-14 ENCOUNTER — Ambulatory Visit (HOSPITAL_COMMUNITY)
Admission: RE | Admit: 2021-08-14 | Discharge: 2021-08-14 | Disposition: A | Discharge status: Home / Self Care | Payer: Medicare Other | Source: Ambulatory Visit | Attending: Internal Medicine | Admitting: Internal Medicine

## 2021-08-14 DIAGNOSIS — Z95828 Presence of other vascular implants and grafts: Secondary | ICD-10-CM

## 2021-08-15 ENCOUNTER — Encounter: Payer: Self-pay | Admitting: *Deleted

## 2021-08-19 ENCOUNTER — Ambulatory Visit: Payer: Medicare Other | Admitting: Nurse Practitioner
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
## 2021-08-25 ENCOUNTER — Ambulatory Visit (INDEPENDENT_AMBULATORY_CARE_PROVIDER_SITE_OTHER): Payer: Medicare Other

## 2021-08-25 ENCOUNTER — Encounter: Payer: Self-pay | Admitting: Nurse Practitioner

## 2021-08-25 ENCOUNTER — Ambulatory Visit (INDEPENDENT_AMBULATORY_CARE_PROVIDER_SITE_OTHER): Payer: Medicare Other | Admitting: Nurse Practitioner

## 2021-08-25 VITALS — BP 126/60 | HR 81 | Ht 70.5 in | Wt 189.0 lb

## 2021-08-25 DIAGNOSIS — R55 Syncope and collapse: Secondary | ICD-10-CM

## 2021-08-25 DIAGNOSIS — I350 Nonrheumatic aortic (valve) stenosis: Secondary | ICD-10-CM | POA: Diagnosis not present

## 2021-08-25 DIAGNOSIS — I6523 Occlusion and stenosis of bilateral carotid arteries: Secondary | ICD-10-CM

## 2021-08-25 DIAGNOSIS — R42 Dizziness and giddiness: Secondary | ICD-10-CM | POA: Diagnosis not present

## 2021-08-25 DIAGNOSIS — E785 Hyperlipidemia, unspecified: Secondary | ICD-10-CM

## 2021-08-25 DIAGNOSIS — I10 Essential (primary) hypertension: Secondary | ICD-10-CM | POA: Diagnosis not present

## 2021-08-25 DIAGNOSIS — G4733 Obstructive sleep apnea (adult) (pediatric): Secondary | ICD-10-CM

## 2021-08-25 DIAGNOSIS — Z8673 Personal history of transient ischemic attack (TIA), and cerebral infarction without residual deficits: Secondary | ICD-10-CM

## 2021-08-25 DIAGNOSIS — I2510 Atherosclerotic heart disease of native coronary artery without angina pectoris: Secondary | ICD-10-CM | POA: Diagnosis not present

## 2021-08-25 DIAGNOSIS — I951 Orthostatic hypotension: Secondary | ICD-10-CM

## 2021-08-25 MED ORDER — METOPROLOL TARTRATE 100 MG PO TABS: ORAL_TABLET | ORAL | 0 refills | Status: DC

## 2021-08-26 ENCOUNTER — Telehealth: Payer: Self-pay | Admitting: Internal Medicine

## 2021-08-26 NOTE — Telephone Encounter (Signed)
Cancel colonoscopy. It can be rescheduled after all of his cardiac issues have been thoroughly worked up and/or resolved.  He should receive cardiac clearance before rescheduling.  Thanks

## 2021-08-28 DIAGNOSIS — I10 Essential (primary) hypertension: Secondary | ICD-10-CM | POA: Diagnosis not present

## 2021-08-28 DIAGNOSIS — I6523 Occlusion and stenosis of bilateral carotid arteries: Secondary | ICD-10-CM | POA: Diagnosis not present

## 2021-08-28 DIAGNOSIS — I2510 Atherosclerotic heart disease of native coronary artery without angina pectoris: Secondary | ICD-10-CM | POA: Diagnosis not present

## 2021-08-28 DIAGNOSIS — I350 Nonrheumatic aortic (valve) stenosis: Secondary | ICD-10-CM | POA: Diagnosis not present

## 2021-09-03 ENCOUNTER — Encounter: Payer: Medicare Other | Admitting: Internal Medicine

## 2021-09-04 ENCOUNTER — Other Ambulatory Visit (HOSPITAL_COMMUNITY)
Admission: RE | Admit: 2021-09-04 | Discharge: 2021-09-04 | Disposition: A | Discharge status: Home / Self Care | Payer: Medicare Other | Source: Ambulatory Visit | Attending: Nurse Practitioner | Admitting: Nurse Practitioner

## 2021-09-04 DIAGNOSIS — I6523 Occlusion and stenosis of bilateral carotid arteries: Secondary | ICD-10-CM | POA: Insufficient documentation

## 2021-09-04 LAB — BASIC METABOLIC PANEL
Anion gap: 6 (ref 5–15)
BUN: 21 mg/dL (ref 8–23)
CO2: 27 mmol/L (ref 22–32)
Calcium: 9.6 mg/dL (ref 8.9–10.3)
Chloride: 103 mmol/L (ref 98–111)
Creatinine, Ser: 1.37 mg/dL — ABNORMAL HIGH (ref 0.61–1.24)
GFR, Estimated: 53 mL/min — ABNORMAL LOW (ref >60)
Glucose, Bld: 158 mg/dL — ABNORMAL HIGH (ref 70–99)
Potassium: 4.7 mmol/L (ref 3.5–5.1)
Sodium: 136 mmol/L (ref 135–145)

## 2021-09-09 ENCOUNTER — Other Ambulatory Visit: Payer: Self-pay | Admitting: Internal Medicine

## 2021-09-09 ENCOUNTER — Telehealth: Payer: Self-pay

## 2021-09-09 NOTE — Telephone Encounter (Signed)
Spoke with pts spouse. Pt was notified of lab results and will follow up as planned.

## 2021-09-10 ENCOUNTER — Telehealth: Payer: Self-pay | Admitting: Cardiology

## 2021-09-10 NOTE — Telephone Encounter (Signed)
Spoke with pt's wife. She is made aware they are to mail the monitor back with the included envelope. She verbalized understanding. No further questions at this time.

## 2021-09-10 NOTE — Telephone Encounter (Signed)
Wife calling in to see if she suppose to bring in the monitor or mail it in. Please advise

## 2021-09-15 ENCOUNTER — Telehealth (HOSPITAL_COMMUNITY): Payer: Self-pay | Admitting: *Deleted

## 2021-09-15 ENCOUNTER — Encounter: Payer: Self-pay | Admitting: Internal Medicine

## 2021-09-15 ENCOUNTER — Ambulatory Visit (INDEPENDENT_AMBULATORY_CARE_PROVIDER_SITE_OTHER): Payer: Medicare Other | Admitting: Internal Medicine

## 2021-09-15 DIAGNOSIS — E1159 Type 2 diabetes mellitus with other circulatory complications: Secondary | ICD-10-CM

## 2021-09-15 DIAGNOSIS — E1142 Type 2 diabetes mellitus with diabetic polyneuropathy: Secondary | ICD-10-CM | POA: Insufficient documentation

## 2021-09-15 DIAGNOSIS — E538 Deficiency of other specified B group vitamins: Secondary | ICD-10-CM

## 2021-09-15 DIAGNOSIS — Z87442 Personal history of urinary calculi: Secondary | ICD-10-CM | POA: Insufficient documentation

## 2021-09-15 DIAGNOSIS — I6523 Occlusion and stenosis of bilateral carotid arteries: Secondary | ICD-10-CM

## 2021-09-15 DIAGNOSIS — E663 Overweight: Secondary | ICD-10-CM | POA: Insufficient documentation

## 2021-09-15 DIAGNOSIS — R55 Syncope and collapse: Secondary | ICD-10-CM | POA: Insufficient documentation

## 2021-09-15 DIAGNOSIS — R634 Abnormal weight loss: Secondary | ICD-10-CM | POA: Diagnosis not present

## 2021-09-15 NOTE — Assessment & Plan Note (Signed)
On B12 

## 2021-09-15 NOTE — Telephone Encounter (Signed)
Attempted to call patient regarding upcoming cardiac CT appointment. °Left message on voicemail with name and callback number ° °Emmalie Haigh RN Navigator Cardiac Imaging °[facility] °[phone] Office °[phone] Cell ° °

## 2021-09-15 NOTE — Telephone Encounter (Signed)
Patient's wife returning call regarding upcoming cardiac imaging study; pt's wife verbalizes understanding of appt date/time, parking situation and where to check in, pre-test NPO status and medications ordered, and verified current allergies; name and call back number provided for further questions should they arise  Gordy Clement RN Navigator Cardiac Imaging [facility] [phone] office [phone] cell  Patient will hold his losartan and take '100mg'$  metoprolol tartrate two hours prior to his cardiac CT scan.  They are aware he is to arrive at 1:30pm.

## 2021-09-15 NOTE — Progress Notes (Signed)
Subjective:  [name]: Andrew Mechanic., male    DOB: Oct 26, 1940  Age: 81 y.o. MRN: [id number]  CC: No chief complaint on file.   HPI Andrew PURDY Sr. presents for CAD, dyslipidemia C/o near-syncope spell 4 weeks ago: he began to fall over and was minimally responsive.  He denies any recurrent syncope, presyncope.  He does note intermittent dizziness, particularly upon standing or when changing positions.  . He saw Dr Jacalyn Lefevre NP Raquel Sarna. Continue Lipitor, Plavix. Coronary CT morphology w/CTA - pending tomorrow F/u on GERD, BPH  Outpatient Medications Prior to Visit  Medication Sig Dispense Refill   acyclovir (ZOVIRAX) 200 MG capsule TAKE 1 CAPSULE(200 MG) BY MOUTH TWICE DAILY 180 capsule 3   ammonium lactate (LAC-HYDRIN) 12 % lotion Apply 1 application topically 2 (two) times daily as needed for dry skin.   6   atorvastatin (LIPITOR) 40 MG tablet TAKE 1 TABLET(40 MG) BY MOUTH DAILY 90 tablet 2   Cholecalciferol 1000 units tablet Take 1,000 Units by mouth daily.     citalopram (CELEXA) 10 MG tablet TAKE 1 TABLET(10 MG) BY MOUTH DAILY 90 tablet 3   clopidogrel (PLAVIX) 75 MG tablet TAKE 1 TABLET(75 MG) BY MOUTH DAILY 90 tablet 0   Cyanocobalamin (VITAMIN B-12) 1000 MCG SUBL Place 1 tablet (1,000 mcg total) under the tongue daily. 100 tablet 3   ipratropium (ATROVENT) 0.03 % nasal spray Place 1 spray into both nostrils 3 (three) times daily as needed for rhinitis. 30 mL 12   Loperamide HCl (IMODIUM A-D PO) Take by mouth as needed.     loratadine (CLARITIN) 10 MG tablet Take 10 mg by mouth at bedtime.     metFORMIN (GLUCOPHAGE-XR) 500 MG 24 hr tablet Take 1,000 mg by mouth daily with supper.   3   metoprolol succinate (TOPROL-XL) 25 MG 24 hr tablet TAKE 1/2 TABLET BY MOUTH AT BEDTIME 90 tablet 2   metoprolol tartrate (LOPRESSOR) 100 MG tablet Take 1 tablet 2 hours prior to procedure. 1 tablet 0   Na Sulfate-K Sulfate-Mg Sulf 17.5-3.13-1.6 GM/177ML SOLN TAKE AS DIRECTED FOR 1 DOSE      pantoprazole (PROTONIX) 40 MG tablet TAKE 1 TABLET(40 MG) BY MOUTH DAILY 90 tablet 3   tamsulosin (FLOMAX) 0.4 MG CAPS capsule TAKE 1 CAPSULE BY MOUTH EVERY NIGHT AT BEDTIME 90 capsule 1   No facility-administered medications prior to visit.    ROS: Review of Systems  Constitutional:  Negative for appetite change, fatigue and unexpected weight change.  HENT:  Negative for congestion, nosebleeds, sneezing, sore throat and trouble swallowing.   Eyes:  Negative for itching and visual disturbance.  Respiratory:  Negative for cough.   Cardiovascular:  Negative for chest pain, palpitations and leg swelling.  Gastrointestinal:  Positive for diarrhea. Negative for abdominal distention, blood in stool and nausea.  Genitourinary:  Negative for frequency and hematuria.  Musculoskeletal:  Positive for back pain. Negative for gait problem, joint swelling and neck pain.  Skin:  Negative for rash.  Neurological:  Positive for dizziness. Negative for tremors, speech difficulty and weakness.  Psychiatric/Behavioral:  Negative for agitation, dysphoric mood and sleep disturbance. The patient is not nervous/anxious.     Objective:  Ht 5' 10.5" (1.791 m)   BMI 26.74 kg/m   BP Readings from Last 3 Encounters:  08/25/21 126/60  07/16/21 (!) 104/54  07/03/21 110/70    Wt Readings from Last 3 Encounters:  08/25/21 189 lb (85.7 kg)  07/16/21 189 lb 2  oz (85.8 kg)  07/03/21 185 lb (83.9 kg)    Physical Exam Constitutional:      General: He is not in acute distress.    Appearance: He is well-developed. He is obese.     Comments: NAD  Eyes:     Conjunctiva/sclera: Conjunctivae normal.     Pupils: Pupils are equal, round, and reactive to light.  Neck:     Thyroid: No thyromegaly.     Vascular: No JVD.  Cardiovascular:     Rate and Rhythm: Normal rate and regular rhythm.     Heart sounds: Normal heart sounds. No murmur heard.    No friction rub. No gallop.  Pulmonary:     Effort: Pulmonary  effort is normal. No respiratory distress.     Breath sounds: Normal breath sounds. No wheezing or rales.  Chest:     Chest wall: No tenderness.  Abdominal:     General: Bowel sounds are normal. There is no distension.     Palpations: Abdomen is soft. There is no mass.     Tenderness: There is no abdominal tenderness. There is no guarding or rebound.  Musculoskeletal:        General: No tenderness. Normal range of motion.     Cervical back: Normal range of motion.  Lymphadenopathy:     Cervical: No cervical adenopathy.  Skin:    General: Skin is warm and dry.     Findings: No rash.  Neurological:     Mental Status: He is alert and oriented to person, place, and time.     Cranial Nerves: No cranial nerve deficit.     Motor: No abnormal muscle tone.     Coordination: Coordination normal.     Gait: Gait normal.     Deep Tendon Reflexes: Reflexes are normal and symmetric.  Psychiatric:        Behavior: Behavior normal.        Thought Content: Thought content normal.        Judgment: Judgment normal.     Lab Results  Component Value Date   WBC 5.9 06/13/2021   HGB 12.2 (L) 06/13/2021   HCT 35.4 (L) 06/13/2021   PLT 225.0 06/13/2021   GLUCOSE 158 (H) 09/04/2021   CHOL 110 10/31/2020   TRIG 169.0 (H) 10/31/2020   HDL 27.70 (L) 10/31/2020   LDLDIRECT 51.0 07/24/2016   LDLCALC 49 10/31/2020   ALT 23 06/13/2021   AST 16 06/13/2021   NA 136 09/04/2021   K 4.7 09/04/2021   CL 103 09/04/2021   CREATININE 1.37 (H) 09/04/2021   BUN 21 09/04/2021   CO2 27 09/04/2021   TSH 1.66 06/13/2021   PSA 1.75 07/24/2016   INR 1.0 07/13/2018   HGBA1C 7.6 (H) 06/13/2021   MICROALBUR <0.7 10/31/2020    No results found.  Assessment & Plan:   Problem List Items Addressed This Visit     B12 deficiency    On B12      DM II (diabetes mellitus, type II), controlled ([place])    On Metformin Check A1c      Near syncope    New near-syncope spell 4 weeks ago: he began to fall over and  was minimally responsive.  He denies any recurrent syncope, presyncope.  He does note intermittent dizziness, particularly upon standing or when changing positions.  . He saw Dr Jacalyn Lefevre NP Raquel Sarna. Continue Lipitor, Plavix. Coronary CT morphology w/CTA - pending tomorrow. Discussed       Weight  loss    Better most of the time         No orders of the defined types were placed in this encounter.     Follow-up: Return in about 3 months (around 12/16/2021) for a follow-up visit.  Walker Kehr, MD

## 2021-09-15 NOTE — Assessment & Plan Note (Signed)
Better most of the time

## 2021-09-15 NOTE — Assessment & Plan Note (Signed)
New near-syncope spell 4 weeks ago: he began to fall over and was minimally responsive.  He denies any recurrent syncope, presyncope.  He does note intermittent dizziness, particularly upon standing or when changing positions.  . He saw Dr Jacalyn Lefevre NP Raquel Sarna. Continue Lipitor, Plavix. Coronary CT morphology w/CTA - pending tomorrow. Discussed

## 2021-09-15 NOTE — Assessment & Plan Note (Signed)
On Metformin Check A1c

## 2021-09-16 ENCOUNTER — Other Ambulatory Visit (HOSPITAL_COMMUNITY): Payer: Self-pay | Admitting: Cardiology

## 2021-09-16 ENCOUNTER — Ambulatory Visit (HOSPITAL_COMMUNITY)
Admission: RE | Admit: 2021-09-16 | Discharge: 2021-09-16 | Disposition: A | Discharge status: Home / Self Care | Payer: Medicare Other | Source: Ambulatory Visit | Attending: Cardiovascular Disease | Admitting: Cardiovascular Disease

## 2021-09-16 ENCOUNTER — Ambulatory Visit (HOSPITAL_BASED_OUTPATIENT_CLINIC_OR_DEPARTMENT_OTHER)
Admission: RE | Admit: 2021-09-16 | Discharge: 2021-09-16 | Disposition: A | Discharge status: Home / Self Care | Payer: Medicare Other | Source: Ambulatory Visit | Attending: Nurse Practitioner | Admitting: Nurse Practitioner

## 2021-09-16 ENCOUNTER — Ambulatory Visit (HOSPITAL_BASED_OUTPATIENT_CLINIC_OR_DEPARTMENT_OTHER)
Admission: RE | Admit: 2021-09-16 | Discharge: 2021-09-16 | Disposition: A | Discharge status: Home / Self Care | Payer: Medicare Other | Source: Ambulatory Visit | Attending: Cardiovascular Disease | Admitting: Cardiovascular Disease

## 2021-09-16 ENCOUNTER — Other Ambulatory Visit: Payer: Self-pay | Admitting: Cardiovascular Disease

## 2021-09-16 DIAGNOSIS — I2510 Atherosclerotic heart disease of native coronary artery without angina pectoris: Secondary | ICD-10-CM | POA: Insufficient documentation

## 2021-09-16 DIAGNOSIS — R931 Abnormal findings on diagnostic imaging of heart and coronary circulation: Secondary | ICD-10-CM | POA: Diagnosis not present

## 2021-09-16 DIAGNOSIS — Z801 Family history of malignant neoplasm of trachea, bronchus and lung: Secondary | ICD-10-CM | POA: Diagnosis not present

## 2021-09-16 DIAGNOSIS — Z95828 Presence of other vascular implants and grafts: Secondary | ICD-10-CM

## 2021-09-16 DIAGNOSIS — G4733 Obstructive sleep apnea (adult) (pediatric): Secondary | ICD-10-CM | POA: Insufficient documentation

## 2021-09-16 DIAGNOSIS — I350 Nonrheumatic aortic (valve) stenosis: Secondary | ICD-10-CM | POA: Diagnosis not present

## 2021-09-16 DIAGNOSIS — R42 Dizziness and giddiness: Secondary | ICD-10-CM | POA: Diagnosis not present

## 2021-09-16 DIAGNOSIS — E785 Hyperlipidemia, unspecified: Secondary | ICD-10-CM | POA: Diagnosis not present

## 2021-09-16 DIAGNOSIS — I200 Unstable angina: Secondary | ICD-10-CM | POA: Diagnosis not present

## 2021-09-16 DIAGNOSIS — R52 Pain, unspecified: Secondary | ICD-10-CM | POA: Diagnosis not present

## 2021-09-16 DIAGNOSIS — I2584 Coronary atherosclerosis due to calcified coronary lesion: Secondary | ICD-10-CM | POA: Diagnosis present

## 2021-09-16 DIAGNOSIS — E7800 Pure hypercholesterolemia, unspecified: Secondary | ICD-10-CM | POA: Diagnosis present

## 2021-09-16 DIAGNOSIS — Z888 Allergy status to other drugs, medicaments and biological substances status: Secondary | ICD-10-CM | POA: Diagnosis not present

## 2021-09-16 DIAGNOSIS — Z8673 Personal history of transient ischemic attack (TIA), and cerebral infarction without residual deficits: Secondary | ICD-10-CM | POA: Insufficient documentation

## 2021-09-16 DIAGNOSIS — E1151 Type 2 diabetes mellitus with diabetic peripheral angiopathy without gangrene: Secondary | ICD-10-CM | POA: Diagnosis present

## 2021-09-16 DIAGNOSIS — R079 Chest pain, unspecified: Secondary | ICD-10-CM | POA: Diagnosis not present

## 2021-09-16 DIAGNOSIS — Z955 Presence of coronary angioplasty implant and graft: Secondary | ICD-10-CM | POA: Diagnosis not present

## 2021-09-16 DIAGNOSIS — I6523 Occlusion and stenosis of bilateral carotid arteries: Secondary | ICD-10-CM | POA: Insufficient documentation

## 2021-09-16 DIAGNOSIS — Z87891 Personal history of nicotine dependence: Secondary | ICD-10-CM | POA: Diagnosis not present

## 2021-09-16 DIAGNOSIS — Z823 Family history of stroke: Secondary | ICD-10-CM | POA: Diagnosis not present

## 2021-09-16 DIAGNOSIS — Z825 Family history of asthma and other chronic lower respiratory diseases: Secondary | ICD-10-CM | POA: Diagnosis not present

## 2021-09-16 DIAGNOSIS — Z8249 Family history of ischemic heart disease and other diseases of the circulatory system: Secondary | ICD-10-CM | POA: Diagnosis not present

## 2021-09-16 DIAGNOSIS — F32A Depression, unspecified: Secondary | ICD-10-CM | POA: Diagnosis present

## 2021-09-16 DIAGNOSIS — I6529 Occlusion and stenosis of unspecified carotid artery: Secondary | ICD-10-CM | POA: Diagnosis not present

## 2021-09-16 DIAGNOSIS — I25110 Atherosclerotic heart disease of native coronary artery with unstable angina pectoris: Secondary | ICD-10-CM | POA: Diagnosis present

## 2021-09-16 DIAGNOSIS — I080 Rheumatic disorders of both mitral and aortic valves: Secondary | ICD-10-CM | POA: Diagnosis present

## 2021-09-16 DIAGNOSIS — I2583 Coronary atherosclerosis due to lipid rich plaque: Secondary | ICD-10-CM | POA: Diagnosis not present

## 2021-09-16 DIAGNOSIS — Z833 Family history of diabetes mellitus: Secondary | ICD-10-CM | POA: Diagnosis not present

## 2021-09-16 DIAGNOSIS — R55 Syncope and collapse: Secondary | ICD-10-CM | POA: Insufficient documentation

## 2021-09-16 DIAGNOSIS — K219 Gastro-esophageal reflux disease without esophagitis: Secondary | ICD-10-CM | POA: Diagnosis present

## 2021-09-16 DIAGNOSIS — I10 Essential (primary) hypertension: Secondary | ICD-10-CM | POA: Diagnosis not present

## 2021-09-16 DIAGNOSIS — Z7902 Long term (current) use of antithrombotics/antiplatelets: Secondary | ICD-10-CM | POA: Diagnosis not present

## 2021-09-16 DIAGNOSIS — Z79899 Other long term (current) drug therapy: Secondary | ICD-10-CM | POA: Diagnosis not present

## 2021-09-16 DIAGNOSIS — Z8711 Personal history of peptic ulcer disease: Secondary | ICD-10-CM | POA: Diagnosis not present

## 2021-09-16 DIAGNOSIS — Z803 Family history of malignant neoplasm of breast: Secondary | ICD-10-CM | POA: Diagnosis not present

## 2021-09-16 DIAGNOSIS — I493 Ventricular premature depolarization: Secondary | ICD-10-CM | POA: Diagnosis present

## 2021-09-16 DIAGNOSIS — R0789 Other chest pain: Secondary | ICD-10-CM | POA: Diagnosis not present

## 2021-09-16 DIAGNOSIS — R9431 Abnormal electrocardiogram [ECG] [EKG]: Secondary | ICD-10-CM | POA: Diagnosis not present

## 2021-09-16 DIAGNOSIS — Z7984 Long term (current) use of oral hypoglycemic drugs: Secondary | ICD-10-CM | POA: Diagnosis not present

## 2021-09-16 MED ORDER — IOHEXOL 350 MG/ML SOLN
100.0000 mL | Freq: Once | INTRAVENOUS | Status: AC | PRN
  Administered 2021-09-16: 100 mL via INTRAVENOUS

## 2021-09-16 MED ORDER — NITROGLYCERIN 0.4 MG SL SUBL
0.8000 mg | SUBLINGUAL_TABLET | Freq: Once | SUBLINGUAL | Status: AC
  Administered 2021-09-16: 0.8 mg via SUBLINGUAL

## 2021-09-16 MED ORDER — NITROGLYCERIN 0.4 MG SL SUBL
SUBLINGUAL_TABLET | SUBLINGUAL | Status: AC
  Filled 2021-09-16: qty 2

## 2021-09-17 ENCOUNTER — Telehealth: Payer: Self-pay | Admitting: Cardiology

## 2021-09-17 NOTE — Telephone Encounter (Signed)
Discussed CT with wife (ok per DPR)-appt is scheduled 7/25.  No further questions at this time.

## 2021-09-17 NOTE — Telephone Encounter (Signed)
  Pt's wife said, she spoke with debra yesterday about the pt's CT result. She said, she couldn't remember one thing she said and requesting if Hilda Blades can call her back

## 2021-09-19 ENCOUNTER — Emergency Department (HOSPITAL_COMMUNITY): Payer: Medicare Other

## 2021-09-19 ENCOUNTER — Inpatient Hospital Stay (HOSPITAL_COMMUNITY)
Admission: EM | Admit: 2021-09-19 | Discharge: 2021-09-22 | DRG: 287 | Disposition: A | Discharge status: Home / Self Care | Payer: Medicare Other | Attending: Cardiology | Admitting: Cardiology

## 2021-09-19 ENCOUNTER — Other Ambulatory Visit: Payer: Self-pay

## 2021-09-19 ENCOUNTER — Encounter (HOSPITAL_COMMUNITY): Payer: Self-pay | Admitting: Emergency Medicine

## 2021-09-19 DIAGNOSIS — Z8249 Family history of ischemic heart disease and other diseases of the circulatory system: Secondary | ICD-10-CM | POA: Diagnosis not present

## 2021-09-19 DIAGNOSIS — I6529 Occlusion and stenosis of unspecified carotid artery: Secondary | ICD-10-CM | POA: Diagnosis not present

## 2021-09-19 DIAGNOSIS — I25110 Atherosclerotic heart disease of native coronary artery with unstable angina pectoris: Principal | ICD-10-CM | POA: Diagnosis present

## 2021-09-19 DIAGNOSIS — E7800 Pure hypercholesterolemia, unspecified: Secondary | ICD-10-CM | POA: Diagnosis present

## 2021-09-19 DIAGNOSIS — Z8711 Personal history of peptic ulcer disease: Secondary | ICD-10-CM | POA: Diagnosis not present

## 2021-09-19 DIAGNOSIS — Z823 Family history of stroke: Secondary | ICD-10-CM | POA: Diagnosis not present

## 2021-09-19 DIAGNOSIS — Z7984 Long term (current) use of oral hypoglycemic drugs: Secondary | ICD-10-CM

## 2021-09-19 DIAGNOSIS — E785 Hyperlipidemia, unspecified: Secondary | ICD-10-CM | POA: Diagnosis not present

## 2021-09-19 DIAGNOSIS — R931 Abnormal findings on diagnostic imaging of heart and coronary circulation: Secondary | ICD-10-CM

## 2021-09-19 DIAGNOSIS — I080 Rheumatic disorders of both mitral and aortic valves: Secondary | ICD-10-CM | POA: Diagnosis present

## 2021-09-19 DIAGNOSIS — Z7902 Long term (current) use of antithrombotics/antiplatelets: Secondary | ICD-10-CM

## 2021-09-19 DIAGNOSIS — Z888 Allergy status to other drugs, medicaments and biological substances status: Secondary | ICD-10-CM | POA: Diagnosis not present

## 2021-09-19 DIAGNOSIS — Z8673 Personal history of transient ischemic attack (TIA), and cerebral infarction without residual deficits: Secondary | ICD-10-CM

## 2021-09-19 DIAGNOSIS — I200 Unstable angina: Principal | ICD-10-CM

## 2021-09-19 DIAGNOSIS — R52 Pain, unspecified: Secondary | ICD-10-CM | POA: Diagnosis not present

## 2021-09-19 DIAGNOSIS — Z79899 Other long term (current) drug therapy: Secondary | ICD-10-CM

## 2021-09-19 DIAGNOSIS — G4733 Obstructive sleep apnea (adult) (pediatric): Secondary | ICD-10-CM | POA: Diagnosis present

## 2021-09-19 DIAGNOSIS — I10 Essential (primary) hypertension: Secondary | ICD-10-CM | POA: Diagnosis present

## 2021-09-19 DIAGNOSIS — F32A Depression, unspecified: Secondary | ICD-10-CM | POA: Diagnosis present

## 2021-09-19 DIAGNOSIS — I2584 Coronary atherosclerosis due to calcified coronary lesion: Secondary | ICD-10-CM | POA: Diagnosis present

## 2021-09-19 DIAGNOSIS — R079 Chest pain, unspecified: Secondary | ICD-10-CM | POA: Diagnosis not present

## 2021-09-19 DIAGNOSIS — I350 Nonrheumatic aortic (valve) stenosis: Secondary | ICD-10-CM

## 2021-09-19 DIAGNOSIS — I739 Peripheral vascular disease, unspecified: Secondary | ICD-10-CM | POA: Diagnosis present

## 2021-09-19 DIAGNOSIS — Z955 Presence of coronary angioplasty implant and graft: Secondary | ICD-10-CM | POA: Diagnosis not present

## 2021-09-19 DIAGNOSIS — R0789 Other chest pain: Secondary | ICD-10-CM | POA: Diagnosis not present

## 2021-09-19 DIAGNOSIS — Z833 Family history of diabetes mellitus: Secondary | ICD-10-CM | POA: Diagnosis not present

## 2021-09-19 DIAGNOSIS — Z87891 Personal history of nicotine dependence: Secondary | ICD-10-CM | POA: Diagnosis not present

## 2021-09-19 DIAGNOSIS — R42 Dizziness and giddiness: Secondary | ICD-10-CM | POA: Diagnosis not present

## 2021-09-19 DIAGNOSIS — E119 Type 2 diabetes mellitus without complications: Secondary | ICD-10-CM

## 2021-09-19 DIAGNOSIS — Z801 Family history of malignant neoplasm of trachea, bronchus and lung: Secondary | ICD-10-CM | POA: Diagnosis not present

## 2021-09-19 DIAGNOSIS — R9431 Abnormal electrocardiogram [ECG] [EKG]: Secondary | ICD-10-CM | POA: Diagnosis not present

## 2021-09-19 DIAGNOSIS — I493 Ventricular premature depolarization: Secondary | ICD-10-CM | POA: Diagnosis present

## 2021-09-19 DIAGNOSIS — Z825 Family history of asthma and other chronic lower respiratory diseases: Secondary | ICD-10-CM | POA: Diagnosis not present

## 2021-09-19 DIAGNOSIS — Z803 Family history of malignant neoplasm of breast: Secondary | ICD-10-CM

## 2021-09-19 DIAGNOSIS — E1151 Type 2 diabetes mellitus with diabetic peripheral angiopathy without gangrene: Secondary | ICD-10-CM | POA: Diagnosis present

## 2021-09-19 DIAGNOSIS — I2510 Atherosclerotic heart disease of native coronary artery without angina pectoris: Secondary | ICD-10-CM | POA: Diagnosis not present

## 2021-09-19 DIAGNOSIS — I2583 Coronary atherosclerosis due to lipid rich plaque: Secondary | ICD-10-CM | POA: Diagnosis not present

## 2021-09-19 DIAGNOSIS — K219 Gastro-esophageal reflux disease without esophagitis: Secondary | ICD-10-CM | POA: Diagnosis present

## 2021-09-19 LAB — COMPREHENSIVE METABOLIC PANEL
ALT: 21 U/L (ref 0–44)
AST: 17 U/L (ref 15–41)
Albumin: 3.6 g/dL (ref 3.5–5.0)
Alkaline Phosphatase: 69 U/L (ref 38–126)
Anion gap: 6 (ref 5–15)
BUN: 23 mg/dL (ref 8–23)
CO2: 27 mmol/L (ref 22–32)
Calcium: 9.7 mg/dL (ref 8.9–10.3)
Chloride: 105 mmol/L (ref 98–111)
Creatinine, Ser: 1.35 mg/dL — ABNORMAL HIGH (ref 0.61–1.24)
GFR, Estimated: 54 mL/min — ABNORMAL LOW (ref >60)
Glucose, Bld: 120 mg/dL — ABNORMAL HIGH (ref 70–99)
Potassium: 4.1 mmol/L (ref 3.5–5.1)
Sodium: 138 mmol/L (ref 135–145)
Total Bilirubin: 0.8 mg/dL (ref 0.3–1.2)
Total Protein: 6.2 g/dL — ABNORMAL LOW (ref 6.5–8.1)

## 2021-09-19 LAB — CBC WITH DIFFERENTIAL/PLATELET
Abs Immature Granulocytes: 0.02 10*3/uL (ref 0.00–0.07)
Basophils Absolute: 0 10*3/uL (ref 0.0–0.1)
Basophils Relative: 1 %
Eosinophils Absolute: 0.2 10*3/uL (ref 0.0–0.5)
Eosinophils Relative: 3 %
HCT: 33.1 % — ABNORMAL LOW (ref 39.0–52.0)
Hemoglobin: 11.2 g/dL — ABNORMAL LOW (ref 13.0–17.0)
Immature Granulocytes: 0 %
Lymphocytes Relative: 23 %
Lymphs Abs: 1.7 10*3/uL (ref 0.7–4.0)
MCH: 29.3 pg (ref 26.0–34.0)
MCHC: 33.8 g/dL (ref 30.0–36.0)
MCV: 86.6 fL (ref 80.0–100.0)
Monocytes Absolute: 0.8 10*3/uL (ref 0.1–1.0)
Monocytes Relative: 10 %
Neutro Abs: 4.6 10*3/uL (ref 1.7–7.7)
Neutrophils Relative %: 63 %
Platelets: 191 10*3/uL (ref 150–400)
RBC: 3.82 MIL/uL — ABNORMAL LOW (ref 4.22–5.81)
RDW: 12.5 % (ref 11.5–15.5)
WBC: 7.3 10*3/uL (ref 4.0–10.5)
nRBC: 0 % (ref 0.0–0.2)

## 2021-09-19 LAB — CBC
HCT: 33.3 % — ABNORMAL LOW (ref 39.0–52.0)
Hemoglobin: 11.1 g/dL — ABNORMAL LOW (ref 13.0–17.0)
MCH: 29.5 pg (ref 26.0–34.0)
MCHC: 33.3 g/dL (ref 30.0–36.0)
MCV: 88.6 fL (ref 80.0–100.0)
Platelets: 191 10*3/uL (ref 150–400)
RBC: 3.76 MIL/uL — ABNORMAL LOW (ref 4.22–5.81)
RDW: 12.7 % (ref 11.5–15.5)
WBC: 7.1 10*3/uL (ref 4.0–10.5)
nRBC: 0 % (ref 0.0–0.2)

## 2021-09-19 LAB — TROPONIN I (HIGH SENSITIVITY)
Troponin I (High Sensitivity): 9 ng/L (ref <18)
Troponin I (High Sensitivity): 9 ng/L (ref <18)

## 2021-09-19 LAB — CREATININE, SERUM
Creatinine, Ser: 1.33 mg/dL — ABNORMAL HIGH (ref 0.61–1.24)
GFR, Estimated: 55 mL/min — ABNORMAL LOW (ref >60)

## 2021-09-19 MED ORDER — ATORVASTATIN CALCIUM 40 MG PO TABS
40.0000 mg | ORAL_TABLET | Freq: Every day | ORAL | Status: DC
Start: 2021-09-19 — End: 2021-09-22
  Administered 2021-09-19 – 2021-09-22 (×4): 40 mg via ORAL
  Filled 2021-09-19 (×4): qty 1

## 2021-09-19 MED ORDER — INSULIN ASPART 100 UNIT/ML IJ SOLN
0.0000 [IU] | Freq: Three times a day (TID) | INTRAMUSCULAR | Status: DC
  Administered 2021-09-20: 1 [IU] via SUBCUTANEOUS
  Administered 2021-09-20 – 2021-09-21 (×2): 2 [IU] via SUBCUTANEOUS

## 2021-09-19 MED ORDER — ASPIRIN 81 MG PO TBEC
81.0000 mg | DELAYED_RELEASE_TABLET | Freq: Every day | ORAL | Status: DC
  Administered 2021-09-20 – 2021-09-21 (×2): 81 mg via ORAL
  Filled 2021-09-19 (×2): qty 1

## 2021-09-19 MED ORDER — ACETAMINOPHEN 325 MG PO TABS: 650.0000 mg | ORAL_TABLET | ORAL | Status: DC | PRN

## 2021-09-19 MED ORDER — ONDANSETRON HCL 4 MG/2ML IJ SOLN: 4.0000 mg | Freq: Four times a day (QID) | INTRAMUSCULAR | Status: DC | PRN

## 2021-09-19 MED ORDER — HEPARIN SODIUM (PORCINE) 5000 UNIT/ML IJ SOLN
5000.0000 [IU] | Freq: Three times a day (TID) | INTRAMUSCULAR | Status: DC
Start: 2021-09-19 — End: 2021-09-22
  Administered 2021-09-19 – 2021-09-22 (×8): 5000 [IU] via SUBCUTANEOUS
  Filled 2021-09-19 (×8): qty 1

## 2021-09-19 MED ORDER — METOPROLOL TARTRATE 12.5 MG HALF TABLET
12.5000 mg | ORAL_TABLET | Freq: Two times a day (BID) | ORAL | Status: DC
Start: 2021-09-20 — End: 2021-09-22
  Administered 2021-09-20 – 2021-09-22 (×5): 12.5 mg via ORAL
  Filled 2021-09-19 (×5): qty 1

## 2021-09-19 MED ORDER — NITROGLYCERIN 0.4 MG SL SUBL: 0.4000 mg | SUBLINGUAL_TABLET | SUBLINGUAL | Status: DC | PRN

## 2021-09-19 NOTE — ED Triage Notes (Signed)
Patient arrived with EMS from home reports central chest pressure with mild SOB and dizziness this afternoon , no emesis or diaphoresis . His cardiologist is Dr. Stanford Breed. He received ASA 324 mg and 1 NTG sl prior to arrival by EMS with relief .

## 2021-09-19 NOTE — ED Notes (Signed)
Blood specimen recollected and sent to lab.
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
## 2021-09-19 NOTE — ED Provider Notes (Signed)
[facility] EMERGENCY DEPARTMENT Provider Note   CSN: [id number] Arrival date & time: 09/19/21  1934     History {Add pertinent medical, surgical, social history, OB history to HPI:1} Chief Complaint  Patient presents with   Chest Pain    Andrew Malone. is a 81 y.o. male.  Patient has a history of hypertension elevated cholesterol and coronary artery disease.  He recently had a CT of his chest that showed calcifications and possible two-vessel disease.  He is supposed to see his cardiologist next week and discuss a cardiac cath.  Patient started to have chest pressure this evening that lasted about 4 hours.  He also had some dizziness.  It was relieved finally by aspirin and nitro   Chest Pain      Home Medications Prior to Admission medications   Medication Sig Start Date End Date Taking? Authorizing Provider  acyclovir (ZOVIRAX) 200 MG capsule TAKE 1 CAPSULE(200 MG) BY MOUTH TWICE DAILY 06/13/21   Plotnikov, Evie Lacks, MD  ammonium lactate (LAC-HYDRIN) 12 % lotion Apply 1 application topically 2 (two) times daily as needed for dry skin.  12/31/14   [provider]  atorvastatin (LIPITOR) 40 MG tablet TAKE 1 TABLET(40 MG) BY MOUTH DAILY 01/21/21   Plotnikov, Evie Lacks, MD  Cholecalciferol 1000 units tablet Take 1,000 Units by mouth daily.    [provider]  citalopram (CELEXA) 10 MG tablet TAKE 1 TABLET(10 MG) BY MOUTH DAILY 04/25/21   Plotnikov, Evie Lacks, MD  clopidogrel (PLAVIX) 75 MG tablet TAKE 1 TABLET(75 MG) BY MOUTH DAILY 09/09/21   Plotnikov, Evie Lacks, MD  Cyanocobalamin (VITAMIN B-12) 1000 MCG SUBL Place 1 tablet (1,000 mcg total) under the tongue daily. 07/17/13   Plotnikov, Evie Lacks, MD  ipratropium (ATROVENT) 0.03 % nasal spray Place 1 spray into both nostrils 3 (three) times daily as needed for rhinitis. 12/03/20   Chesley Mires, MD  Loperamide HCl (IMODIUM A-D PO) Take by mouth as needed.    [provider]   loratadine (CLARITIN) 10 MG tablet Take 10 mg by mouth at bedtime.    [provider]  metFORMIN (GLUCOPHAGE-XR) 500 MG 24 hr tablet Take 1,000 mg by mouth daily with supper.  04/01/15   [provider]  metoprolol succinate (TOPROL-XL) 25 MG 24 hr tablet TAKE 1/2 TABLET BY MOUTH AT BEDTIME 10/11/20   Plotnikov, Evie Lacks, MD  metoprolol tartrate (LOPRESSOR) 100 MG tablet Take 1 tablet 2 hours prior to procedure. 08/25/21   Monge, Helane Gunther, NP  Na Sulfate-K Sulfate-Mg Sulf 17.5-3.13-1.6 GM/177ML SOLN TAKE AS DIRECTED FOR 1 DOSE 07/16/21   [provider]  pantoprazole (PROTONIX) 40 MG tablet TAKE 1 TABLET(40 MG) BY MOUTH DAILY 11/14/20   Plotnikov, Evie Lacks, MD  tamsulosin (FLOMAX) 0.4 MG CAPS capsule TAKE 1 CAPSULE BY MOUTH EVERY NIGHT AT BEDTIME 08/06/21   Plotnikov, Evie Lacks, MD      Allergies    Empagliflozin, Lisinopril, and Trulicity [dulaglutide]    Review of Systems   Review of Systems  Cardiovascular:  Positive for chest pain.    Physical Exam Updated Vital Signs BP (!) 150/74   Pulse 62   Resp 19   SpO2 95%  Physical Exam  ED Results / Procedures / Treatments   Labs (all labs ordered are listed, but only abnormal results are displayed) Labs Reviewed  CBC WITH DIFFERENTIAL/PLATELET - Abnormal; Notable for the following components:      Result Value  RBC 3.82 (*)    Hemoglobin 11.2 (*)    HCT 33.1 (*)    All other components within normal limits  COMPREHENSIVE METABOLIC PANEL - Abnormal; Notable for the following components:   Glucose, Bld 120 (*)    Creatinine, Ser 1.35 (*)    Total Protein 6.2 (*)    GFR, Estimated 54 (*)    All other components within normal limits  TROPONIN I (HIGH SENSITIVITY)  TROPONIN I (HIGH SENSITIVITY)    EKG EKG Interpretation  Date/Time:  Friday September 19 2021 19:42:06 EDT Ventricular Rate:  66 PR Interval:  163 QRS Duration: 103 QT Interval:  392 QTC Calculation: 411 R Axis:   -25 Text  Interpretation: Sinus rhythm Borderline left axis deviation Confirmed by Milton Ferguson [id number]) on 09/19/2021 7:48:46 PM  Radiology DG Chest Port 1 View  Result Date: 09/19/2021 CLINICAL DATA:  Chest pain EXAM: PORTABLE CHEST 1 VIEW COMPARISON:  10/26/2016 FINDINGS: Heart size and pulmonary vascularity are normal. Segmental elevation of the right hemidiaphragm as seen previously. No change. Lungs are clear. No pleural effusions. No pneumothorax. Mediastinal contours appear intact. Calcification of the aorta. Degenerative changes in the spine and shoulders. IMPRESSION: No active disease. Electronically Signed   By: Lucienne Capers M.D.   On: 09/19/2021 20:10    Procedures Procedures  {Document cardiac monitor, telemetry assessment procedure when appropriate:1}  Medications Ordered in ED Medications - No data to display  ED Course/ Medical Decision Making/ A&P                           Medical Decision Making Amount and/or Complexity of Data Reviewed Labs: ordered. Radiology: ordered.  Risk Decision regarding hospitalization.   Patient with chest discomfort most likely related to coronary artery disease.  He will be admitted to the cardiologist {Document critical care time when appropriate:1} {Document review of labs and clinical decision tools ie heart score, Chads2Vasc2 etc:1}  {Document your independent review of radiology images, and any outside records:1} {Document your discussion with family members, caretakers, and with consultants:1} {Document social determinants of health affecting pt's care:1} {Document your decision making why or why not admission, treatments were needed:1} Final Clinical Impression(s) / ED Diagnoses Final diagnoses:  Unstable angina ([place])    Rx / DC Orders ED Discharge Orders     None

## 2021-09-20 ENCOUNTER — Inpatient Hospital Stay (HOSPITAL_COMMUNITY): Payer: Medicare Other

## 2021-09-20 DIAGNOSIS — R9431 Abnormal electrocardiogram [ECG] [EKG]: Secondary | ICD-10-CM | POA: Diagnosis not present

## 2021-09-20 DIAGNOSIS — I200 Unstable angina: Secondary | ICD-10-CM | POA: Diagnosis not present

## 2021-09-20 LAB — CBG MONITORING, ED
Glucose-Capillary: 122 mg/dL — ABNORMAL HIGH (ref 70–99)
Glucose-Capillary: 117 mg/dL — ABNORMAL HIGH (ref 70–99)
Glucose-Capillary: 145 mg/dL — ABNORMAL HIGH (ref 70–99)

## 2021-09-20 LAB — GLUCOSE, CAPILLARY: Glucose-Capillary: 103 mg/dL — ABNORMAL HIGH (ref 70–99)

## 2021-09-20 LAB — BASIC METABOLIC PANEL
Anion gap: 9 (ref 5–15)
BUN: 22 mg/dL (ref 8–23)
CO2: 25 mmol/L (ref 22–32)
Calcium: 9.3 mg/dL (ref 8.9–10.3)
Chloride: 104 mmol/L (ref 98–111)
Creatinine, Ser: 1.23 mg/dL (ref 0.61–1.24)
GFR, Estimated: >60 mL/min (ref >60)
Glucose, Bld: 129 mg/dL — ABNORMAL HIGH (ref 70–99)
Potassium: 4 mmol/L (ref 3.5–5.1)
Sodium: 138 mmol/L (ref 135–145)

## 2021-09-20 LAB — ECHOCARDIOGRAM COMPLETE
AR max vel: 0.91 cm2
AV Area VTI: 1.09 cm2
AV Area mean vel: 0.99 cm2
AV Mean grad: 22 mmHg
AV Peak grad: 40.2 mmHg
Ao pk vel: 3.17 m/s
Area-P 1/2: 2.8 cm2
S' Lateral: 2.9 cm

## 2021-09-20 MED ORDER — TAMSULOSIN HCL 0.4 MG PO CAPS
0.4000 mg | ORAL_CAPSULE | Freq: Every day | ORAL | Status: DC
  Administered 2021-09-20 – 2021-09-21 (×2): 0.4 mg via ORAL
  Filled 2021-09-20 (×2): qty 1

## 2021-09-20 MED ORDER — PANTOPRAZOLE SODIUM 40 MG PO TBEC
40.0000 mg | DELAYED_RELEASE_TABLET | Freq: Every day | ORAL | Status: DC
  Administered 2021-09-20 – 2021-09-22 (×3): 40 mg via ORAL
  Filled 2021-09-20 (×3): qty 1

## 2021-09-20 MED ORDER — LORATADINE 10 MG PO TABS
10.0000 mg | ORAL_TABLET | Freq: Every day | ORAL | Status: DC
  Administered 2021-09-20 – 2021-09-21 (×2): 10 mg via ORAL
  Filled 2021-09-20 (×2): qty 1

## 2021-09-20 MED ORDER — CITALOPRAM HYDROBROMIDE 10 MG PO TABS
10.0000 mg | ORAL_TABLET | Freq: Every day | ORAL | Status: DC
  Filled 2021-09-20: qty 1

## 2021-09-20 MED ORDER — CLOPIDOGREL BISULFATE 75 MG PO TABS
75.0000 mg | ORAL_TABLET | Freq: Every day | ORAL | Status: DC
  Administered 2021-09-20 – 2021-09-22 (×3): 75 mg via ORAL
  Filled 2021-09-20 (×3): qty 1

## 2021-09-20 MED ORDER — PERFLUTREN LIPID MICROSPHERE
1.0000 mL | INTRAVENOUS | Status: AC | PRN
  Administered 2021-09-20: 3 mL via INTRAVENOUS

## 2021-09-20 MED ORDER — VITAMIN B-12 100 MCG PO TABS
100.0000 ug | ORAL_TABLET | Freq: Every day | ORAL | Status: DC
  Administered 2021-09-20 – 2021-09-22 (×3): 100 ug via ORAL
  Filled 2021-09-20 (×3): qty 1

## 2021-09-20 MED ORDER — CITALOPRAM HYDROBROMIDE 10 MG PO TABS
10.0000 mg | ORAL_TABLET | Freq: Every day | ORAL | Status: DC
Start: 2021-09-20 — End: 2021-09-22
  Administered 2021-09-20 – 2021-09-21 (×2): 10 mg via ORAL
  Filled 2021-09-20 (×2): qty 1

## 2021-09-20 MED ORDER — VITAMIN D 25 MCG (1000 UNIT) PO TABS
1000.0000 [IU] | ORAL_TABLET | Freq: Every day | ORAL | Status: DC
  Administered 2021-09-20 – 2021-09-22 (×3): 1000 [IU] via ORAL
  Filled 2021-09-20 (×3): qty 1

## 2021-09-20 NOTE — ED Notes (Signed)
Pt eating lunch
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
## 2021-09-20 NOTE — ED Notes (Signed)
Lunch tray ordered 

## 2021-09-20 NOTE — ED Notes (Signed)
Pt ambulatory to and from restroom with steady gait 

## 2021-09-20 NOTE — ED Notes (Signed)
Report given to receiving RN, RN reports she is ready to receive patient.

## 2021-09-20 NOTE — Progress Notes (Signed)
Progress Note  Patient Name: Andrew SELIGA Sr. Date of Encounter: 09/20/2021  [facility] Cardiologist: Kirk Ruths, MD   Subjective   No chest pain currently, no dizziness  Inpatient Medications    Scheduled Meds:  aspirin EC  81 mg Oral Daily   atorvastatin  40 mg Oral Daily   cholecalciferol  1,000 Units Oral Daily   citalopram  10 mg Oral Daily   clopidogrel  75 mg Oral Daily   heparin  5,000 Units Subcutaneous Q8H   insulin aspart  0-9 Units Subcutaneous TID WC   loratadine  10 mg Oral QHS   metoprolol tartrate  12.5 mg Oral BID   pantoprazole  40 mg Oral Daily   tamsulosin  0.4 mg Oral QHS   Continuous Infusions:  PRN Meds: acetaminophen, nitroGLYCERIN, ondansetron (ZOFRAN) IV   Vital Signs    Vitals:   09/20/21 0730 09/20/21 0748 09/20/21 0845 09/20/21 0846  BP: (!) 154/71  (!) 162/77   Pulse: 65  66   Resp: 18  18   Temp:    98.1 F (36.7 C)  TempSrc:      SpO2: 94% 94% 96%     Intake/Output Summary (Last 24 hours) at 09/20/2021 0851 Last data filed at 09/20/2021 0631 Gross per 24 hour  Intake --  Output 800 ml  Net -800 ml      08/25/2021    2:15 PM 07/16/2021    3:14 PM 07/03/2021   10:27 AM  Last 3 Weights  Weight (lbs) 189 lb 189 lb 2 oz 185 lb  Weight (kg) 85.73 kg 85.787 kg 83.915 kg      Telemetry    SR with some PVCs - Personally Reviewed  ECG    SR - Personally Reviewed  Physical Exam   GEN: No acute distress.   Neck: No JVD Cardiac: RRR, 2/6 mid to late peaking systolic ejection murmur across precordium Respiratory: Clear to auscultation bilaterally. GI: Soft, nontender, non-distended  MS: No edema; No deformity. Neuro:  Nonfocal  Psych: Normal affect   Labs    High Sensitivity Troponin:   Recent Labs  Lab 09/19/21 2103 09/19/21 2200  TROPONINIHS 9 9     Chemistry Recent Labs  Lab 09/19/21 2103 09/19/21 2200  NA 138  --   K 4.1  --   CL 105  --   CO2 27  --   GLUCOSE 120*  --   BUN 23  --    CREATININE 1.35* 1.33*  CALCIUM 9.7  --   PROT 6.2*  --   ALBUMIN 3.6  --   AST 17  --   ALT 21  --   ALKPHOS 69  --   BILITOT 0.8  --   GFRNONAA 54* 55*  ANIONGAP 6  --     Lipids No results for input(s): "CHOL", "TRIG", "HDL", "LABVLDL", "LDLCALC", "CHOLHDL" in the last 168 hours.  Hematology Recent Labs  Lab 09/19/21 2103 09/19/21 2200  WBC 7.3 7.1  RBC 3.82* 3.76*  HGB 11.2* 11.1*  HCT 33.1* 33.3*  MCV 86.6 88.6  MCH 29.3 29.5  MCHC 33.8 33.3  RDW 12.5 12.7  PLT 191 191   Thyroid No results for input(s): "TSH", "FREET4" in the last 168 hours.  BNPNo results for input(s): "BNP", "PROBNP" in the last 168 hours.  DDimer No results for input(s): "DDIMER" in the last 168 hours.   Radiology    DG Chest Port 1 View  Result Date: 09/19/2021  CLINICAL DATA:  Chest pain EXAM: PORTABLE CHEST 1 VIEW COMPARISON:  10/26/2016 FINDINGS: Heart size and pulmonary vascularity are normal. Segmental elevation of the right hemidiaphragm as seen previously. No change. Lungs are clear. No pleural effusions. No pneumothorax. Mediastinal contours appear intact. Calcification of the aorta. Degenerative changes in the spine and shoulders. IMPRESSION: No active disease. Electronically Signed   By: Lucienne Capers M.D.   On: 09/19/2021 20:10    Cardiac Studies   CCTA/CT FFR positive for LAD and RCA disease.  Repeat echo pending  Patient Profile     81 y.o. male pmh sx for aortic stenosis, CAD, carotid artery stenosis, hypertension, hyperlipidemia, CVA, OSA, GERD, who is being seen 09/19/2021 for the evaluation of chest pain.   Assessment & Plan    Principal Problem:   Chest pain Active Problems:   Dyslipidemia   Depression   Essential hypertension   Coronary atherosclerosis   PVD   GERD   Aortic stenosis, moderate   DM II (diabetes mellitus, type II), controlled (HCC)  -Chest pain with evidence on coronary CTA of obstructive hemodynamically significant LAD and RCA disease,  outpatient plan would have been for coronary angiography as well, now presents with chest pain requiring inpatient management. -Continue aspirin, Plavix, atorvastatin 40 mg daily, metoprolol.  Patient has a history of multiple prior strokes and Plavix is a home med. - will proceed with echocardiogram, 6-2/1 systolic ejection murmur will define status of aortic stenosis -Coronary angiography Monday, consent performed.  Patient in agreement with plan.   INFORMED CONSENT: I have reviewed the risks, indications, and alternatives to cardiac catheterization, possible angioplasty, and stenting with the patient. Risks include but are not limited to bleeding, infection, vascular injury, stroke, myocardial infarction, arrhythmia, kidney injury, radiation-related injury in the case of prolonged fluoroscopy use, emergency cardiac surgery, and death. The patient understands the risks of serious complication is 1-2 in 3086 with diagnostic cardiac cath and 1-2% or less with angioplasty/stenting.    For questions or updates, please contact [place] Please consult [email or web address] for contact info under        Signed, Elouise Munroe, MD  09/20/2021, 8:51 AM
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
## 2021-09-21 DIAGNOSIS — I200 Unstable angina: Secondary | ICD-10-CM | POA: Diagnosis not present

## 2021-09-21 LAB — BASIC METABOLIC PANEL
Anion gap: 7 (ref 5–15)
BUN: 25 mg/dL — ABNORMAL HIGH (ref 8–23)
CO2: 24 mmol/L (ref 22–32)
Calcium: 9.6 mg/dL (ref 8.9–10.3)
Chloride: 106 mmol/L (ref 98–111)
Creatinine, Ser: 1.25 mg/dL — ABNORMAL HIGH (ref 0.61–1.24)
GFR, Estimated: 59 mL/min — ABNORMAL LOW (ref >60)
Glucose, Bld: 152 mg/dL — ABNORMAL HIGH (ref 70–99)
Potassium: 4.1 mmol/L (ref 3.5–5.1)
Sodium: 137 mmol/L (ref 135–145)

## 2021-09-21 LAB — CBC
HCT: 33.8 % — ABNORMAL LOW (ref 39.0–52.0)
Hemoglobin: 11.8 g/dL — ABNORMAL LOW (ref 13.0–17.0)
MCH: 29.9 pg (ref 26.0–34.0)
MCHC: 34.9 g/dL (ref 30.0–36.0)
MCV: 85.8 fL (ref 80.0–100.0)
Platelets: 195 10*3/uL (ref 150–400)
RBC: 3.94 MIL/uL — ABNORMAL LOW (ref 4.22–5.81)
RDW: 12.6 % (ref 11.5–15.5)
WBC: 5.7 10*3/uL (ref 4.0–10.5)
nRBC: 0 % (ref 0.0–0.2)

## 2021-09-21 LAB — GLUCOSE, CAPILLARY
Glucose-Capillary: 106 mg/dL — ABNORMAL HIGH (ref 70–99)
Glucose-Capillary: 129 mg/dL — ABNORMAL HIGH (ref 70–99)
Glucose-Capillary: 119 mg/dL — ABNORMAL HIGH (ref 70–99)
Glucose-Capillary: 155 mg/dL — ABNORMAL HIGH (ref 70–99)
Glucose-Capillary: 198 mg/dL — ABNORMAL HIGH (ref 70–99)

## 2021-09-21 MED ORDER — ASPIRIN 81 MG PO CHEW
81.0000 mg | CHEWABLE_TABLET | ORAL | Status: AC
  Administered 2021-09-22: 81 mg via ORAL
  Filled 2021-09-21: qty 1

## 2021-09-21 MED ORDER — ASPIRIN 81 MG PO TBEC: 81.0000 mg | DELAYED_RELEASE_TABLET | Freq: Every day | ORAL | Status: DC

## 2021-09-21 MED ORDER — SODIUM CHLORIDE 0.9 % WEIGHT BASED INFUSION
1.0000 mL/kg/h | INTRAVENOUS | Status: DC
  Administered 2021-09-22: 1 mL/kg/h via INTRAVENOUS

## 2021-09-21 MED ORDER — SODIUM CHLORIDE 0.9 % IV SOLN: 250.0000 mL | INTRAVENOUS | Status: DC | PRN

## 2021-09-21 MED ORDER — SODIUM CHLORIDE 0.9% FLUSH: 3.0000 mL | INTRAVENOUS | Status: DC | PRN

## 2021-09-21 MED ORDER — SODIUM CHLORIDE 0.9 % WEIGHT BASED INFUSION
3.0000 mL/kg/h | INTRAVENOUS | Status: DC
  Administered 2021-09-22: 3 mL/kg/h via INTRAVENOUS

## 2021-09-21 MED ORDER — SODIUM CHLORIDE 0.9% FLUSH
3.0000 mL | Freq: Two times a day (BID) | INTRAVENOUS | Status: DC
  Administered 2021-09-21 (×2): 3 mL via INTRAVENOUS

## 2021-09-21 NOTE — H&P (View-Only) (Signed)
Progress Note  Patient Name: Andrew BAUDOIN Sr. Date of Encounter: 09/21/2021  [facility] Cardiologist: Kirk Ruths, MD   Subjective   No chest pain currently, no dizziness  Inpatient Medications    Scheduled Meds:  aspirin EC  81 mg Oral Daily   atorvastatin  40 mg Oral Daily   cholecalciferol  1,000 Units Oral Daily   citalopram  10 mg Oral QHS   clopidogrel  75 mg Oral Daily   heparin  5,000 Units Subcutaneous Q8H   insulin aspart  0-9 Units Subcutaneous TID WC   loratadine  10 mg Oral QHS   metoprolol tartrate  12.5 mg Oral BID   pantoprazole  40 mg Oral Daily   tamsulosin  0.4 mg Oral QHS   vitamin B-12  100 mcg Oral Daily   Continuous Infusions:  PRN Meds: acetaminophen, nitroGLYCERIN, ondansetron (ZOFRAN) IV   Vital Signs    Vitals:   09/21/21 0103 09/21/21 0439 09/21/21 0900 09/21/21 0921  BP: 133/70 140/80  133/89  Pulse: 77 76  75  Resp: '18 14  19  '$ Temp: 98 F (36.7 C) 98 F (36.7 C) 98 F (36.7 C) 98.1 F (36.7 C)  TempSrc: Oral Oral  Oral  SpO2: 98% 95% 91% 94%  Weight: 82.5 kg      No intake or output data in the 24 hours ending 09/21/21 1054     09/21/2021    1:03 AM 08/25/2021    2:15 PM 07/16/2021    3:14 PM  Last 3 Weights  Weight (lbs) 181 lb 14.4 oz 189 lb 189 lb 2 oz  Weight (kg) 82.509 kg 85.73 kg 85.787 kg      Telemetry    SR with some PVCs - Personally Reviewed  ECG    SR - Personally Reviewed  Physical Exam   GEN: No acute distress.   Neck: No JVD Cardiac: RRR, 2/6 mid to late peaking systolic ejection murmur across precordium Respiratory: Clear to auscultation bilaterally. GI: Soft, nontender, non-distended  MS: No edema; No deformity. Neuro:  Nonfocal  Psych: Normal affect   Labs    High Sensitivity Troponin:   Recent Labs  Lab 09/19/21 2103 09/19/21 2200  TROPONINIHS 9 9     Chemistry Recent Labs  Lab 09/19/21 2103 09/19/21 2200 09/20/21 0840  NA 138  --  138  K 4.1  --  4.0  CL 105  --   104  CO2 27  --  25  GLUCOSE 120*  --  129*  BUN 23  --  22  CREATININE 1.35* 1.33* 1.23  CALCIUM 9.7  --  9.3  PROT 6.2*  --   --   ALBUMIN 3.6  --   --   AST 17  --   --   ALT 21  --   --   ALKPHOS 69  --   --   BILITOT 0.8  --   --   GFRNONAA 54* 55* >60  ANIONGAP 6  --  9    Lipids No results for input(s): "CHOL", "TRIG", "HDL", "LABVLDL", "LDLCALC", "CHOLHDL" in the last 168 hours.  Hematology Recent Labs  Lab 09/19/21 2103 09/19/21 2200  WBC 7.3 7.1  RBC 3.82* 3.76*  HGB 11.2* 11.1*  HCT 33.1* 33.3*  MCV 86.6 88.6  MCH 29.3 29.5  MCHC 33.8 33.3  RDW 12.5 12.7  PLT 191 191   Thyroid No results for input(s): "TSH", "FREET4" in the last 168 hours.  BNPNo  results for input(s): "BNP", "PROBNP" in the last 168 hours.  DDimer No results for input(s): "DDIMER" in the last 168 hours.   Radiology    ECHOCARDIOGRAM COMPLETE  Result Date: 09/20/2021    ECHOCARDIOGRAM REPORT   Patient Name:   Andrew PEINE Sr. Date of Exam: 09/20/2021 Medical Rec #:  [id number]           Height:       70.5 in Accession #:    [id number]          Weight:       189.0 lb Date of Birth:  02-17-1941           BSA:          2.049 m Patient Age:    81 years            BP:           147/61 mmHg Patient Gender: M                   HR:           66 bpm. Exam Location:  Inpatient Procedure: 2D Echo, Cardiac Doppler, Color Doppler and Intracardiac            Opacification Agent Indications:    Abnormal ECG R94.31  History:        Patient has prior history of Echocardiogram examinations, most                 recent 04/16/2021. CAD, Stroke and Carotid Disease, Aortic Valve                 Disease, Signs/Symptoms:Murmur; Risk Factors:Hypertension,                 Dyslipidemia and Sleep Apnea. GERD.  Sonographer:    Darlina Sicilian RDCS Referring Phys: [id number] [place]  1. Left ventricular ejection fraction, by estimation, is 60 to 65%. The left ventricle has normal function. The left ventricle has  no regional wall motion abnormalities. There is severe asymmetric left ventricular hypertrophy of the basal-septal segment. Left ventricular diastolic parameters are consistent with Grade I diastolic dysfunction (impaired relaxation).  2. Right ventricular systolic function is normal. The right ventricular size is normal. There is normal pulmonary artery systolic pressure.  3. The mitral valve is normal in structure. No evidence of mitral valve regurgitation. No evidence of mitral stenosis. Moderate mitral annular calcification.  4. 2D aortic Valve area 0.9 cm2 but AVA from VTI 1.09 cm2. Diminished LV Stroke Volume Index at 33. DVI 0.38. The aortic valve is calcified. Aortic valve regurgitation is trivial. Likely Moderate aortic valve stenosis. Comparison(s): Slight increase in gradients from prior; for future studies, greatest gradients at [facility]. FINDINGS  Left Ventricle: Left ventricular ejection fraction, by estimation, is 60 to 65%. The left ventricle has normal function. The left ventricle has no regional wall motion abnormalities. The left ventricular internal cavity size was normal in size. There is  severe asymmetric left ventricular hypertrophy of the basal-septal segment. Left ventricular diastolic parameters are consistent with Grade I diastolic dysfunction (impaired relaxation). Right Ventricle: The right ventricular size is normal. No increase in right ventricular wall thickness. Right ventricular systolic function is normal. There is normal pulmonary artery systolic pressure. The tricuspid regurgitant velocity is 1.99 m/s, and  with an assumed right atrial pressure of 3 mmHg, the estimated right ventricular systolic pressure is 61.4 mmHg. Left Atrium: Left atrial size was normal  in size. Right Atrium: Right atrial size was normal in size. Pericardium: There is no evidence of pericardial effusion. Mitral Valve: The mitral valve is normal in structure. Moderate mitral annular calcification. No  evidence of mitral valve regurgitation. No evidence of mitral valve stenosis. Tricuspid Valve: The tricuspid valve is normal in structure. Tricuspid valve regurgitation is mild . No evidence of tricuspid stenosis. Aortic Valve: 2D aortic Valve area 0.9 cm2; Diminished LV Stroke Volume Index at 33. DVI 0.38. The aortic valve is calcified. Aortic valve regurgitation is trivial. Moderate aortic stenosis is present. Aortic valve mean gradient measures 22.0 mmHg. Aortic valve peak gradient measures 40.2 mmHg. Aortic valve area, by VTI measures 1.09 cm. Pulmonic Valve: The pulmonic valve was not well visualized. Pulmonic valve regurgitation is not visualized. No evidence of pulmonic stenosis. Aorta: The aortic root and ascending aorta are structurally normal, with no evidence of dilitation. IAS/Shunts: No atrial level shunt detected by color flow Doppler.  LEFT VENTRICLE PLAX 2D LVIDd:         4.30 cm   Diastology LVIDs:         2.90 cm   LV e' medial:    5.22 cm/s LV PW:         0.90 cm   LV E/e' medial:  15.2 LV IVS:        1.70 cm   LV e' lateral:   5.77 cm/s LVOT diam:     1.90 cm   LV E/e' lateral: 13.7 LV SV:         67 LV SV Index:   33 LVOT Area:     2.84 cm  RIGHT VENTRICLE RV S prime:     14.60 cm/s LEFT ATRIUM             Index        RIGHT ATRIUM          Index LA diam:        4.40 cm 2.15 cm/m   RA Area:     8.97 cm LA Vol (A2C):   36.2 ml 17.67 ml/m  RA Volume:   17.30 ml 8.45 ml/m LA Vol (A4C):   43.2 ml 21.09 ml/m LA Biplane Vol: 40.9 ml 19.97 ml/m  AORTIC VALVE AV Area (Vmax):    0.91 cm AV Area (Vmean):   0.99 cm AV Area (VTI):     1.09 cm AV Vmax:           317.00 cm/s AV Vmean:          204.333 cm/s AV VTI:            0.622 m AV Peak Grad:      40.2 mmHg AV Mean Grad:      22.0 mmHg LVOT Vmax:         102.00 cm/s LVOT Vmean:        71.500 cm/s LVOT VTI:          0.238 m LVOT/AV VTI ratio: 0.38  AORTA Ao Asc diam: 3.50 cm MITRAL VALVE                TRICUSPID VALVE MV Area (PHT): 2.80 cm      TR Peak grad:   15.8 mmHg MV Decel Time: 271 msec     TR Vmax:        199.00 cm/s MV E velocity: 79.10 cm/s MV A velocity: 119.00 cm/s  SHUNTS MV E/A ratio:  0.66  Systemic VTI:  0.24 m                             Systemic Diam: 1.90 cm Rudean Haskell MD Electronically signed by Rudean Haskell MD Signature Date/Time: 09/20/2021/1:29:50 PM    Final    DG Chest Port 1 View  Result Date: 09/19/2021 CLINICAL DATA:  Chest pain EXAM: PORTABLE CHEST 1 VIEW COMPARISON:  10/26/2016 FINDINGS: Heart size and pulmonary vascularity are normal. Segmental elevation of the right hemidiaphragm as seen previously. No change. Lungs are clear. No pleural effusions. No pneumothorax. Mediastinal contours appear intact. Calcification of the aorta. Degenerative changes in the spine and shoulders. IMPRESSION: No active disease. Electronically Signed   By: Lucienne Capers M.D.   On: 09/19/2021 20:10    Cardiac Studies   CCTA/CT FFR positive for LAD and RCA disease.  Echo:  1. Left ventricular ejection fraction, by estimation, is 60 to 65%. The  left ventricle has normal function. The left ventricle has no regional  wall motion abnormalities. There is severe asymmetric left ventricular  hypertrophy of the basal-septal  segment. Left ventricular diastolic parameters are consistent with Grade I  diastolic dysfunction (impaired relaxation).   2. Right ventricular systolic function is normal. The right ventricular  size is normal. There is normal pulmonary artery systolic pressure.   3. The mitral valve is normal in structure. No evidence of mitral valve  regurgitation. No evidence of mitral stenosis. Moderate mitral annular  calcification.   4. 2D aortic Valve area 0.9 cm2 but AVA from VTI 1.09 cm2. Diminished LV  Stroke Volume Index at 33. DVI 0.38. The aortic valve is calcified. Aortic  valve regurgitation is trivial. Likely Moderate aortic valve stenosis.   Comparison(s): Slight increase in  gradients from prior; for future  studies, greatest gradients at [facility].   Patient Profile     81 y.o. male pmh sx for aortic stenosis, CAD, carotid artery stenosis, hypertension, hyperlipidemia, CVA, OSA, GERD, who is being seen 09/19/2021 for the evaluation of chest pain.   Assessment & Plan    Principal Problem:   Chest pain Active Problems:   Dyslipidemia   Depression   Essential hypertension   Coronary atherosclerosis   PVD   GERD   Aortic stenosis, moderate   DM II (diabetes mellitus, type II), controlled (HCC)  -Chest pain with evidence on coronary CTA of obstructive hemodynamically significant LAD and RCA disease, outpatient plan would have been for coronary angiography as well, now presents with chest pain requiring inpatient management. -Continue aspirin, Plavix, atorvastatin 40 mg daily, metoprolol.  Patient has a history of multiple prior strokes and Plavix is a home med. - echo shows moderate AS, consistent with exam. -Coronary angiography Monday, consent performed.  Patient in agreement with plan.   INFORMED CONSENT: I have reviewed the risks, indications, and alternatives to cardiac catheterization, possible angioplasty, and stenting with the patient. Risks include but are not limited to bleeding, infection, vascular injury, stroke, myocardial infarction, arrhythmia, kidney injury, radiation-related injury in the case of prolonged fluoroscopy use, emergency cardiac surgery, and death. The patient understands the risks of serious complication is 1-2 in [id number] with diagnostic cardiac cath and 1-2% or less with angioplasty/stenting.    For questions or updates, please contact [place] Please consult [email or web address] for contact info under        Signed, Elouise Munroe, MD  09/21/2021, 10:54 AM

## 2021-09-21 NOTE — Plan of Care (Signed)

## 2021-09-21 NOTE — Progress Notes (Addendum)
Pre cath orders written per MD request, placed on add on board for Monday - Dr. Margaretann Loveless confirms standard pre cath IVF ok.

## 2021-09-21 NOTE — Progress Notes (Signed)
Progress Note  Patient Name: Andrew MIRSKY Sr. Date of Encounter: 09/21/2021  [facility] Cardiologist: Kirk Ruths, MD   Subjective   No chest pain currently, no dizziness  Inpatient Medications    Scheduled Meds:  aspirin EC  81 mg Oral Daily   atorvastatin  40 mg Oral Daily   cholecalciferol  1,000 Units Oral Daily   citalopram  10 mg Oral QHS   clopidogrel  75 mg Oral Daily   heparin  5,000 Units Subcutaneous Q8H   insulin aspart  0-9 Units Subcutaneous TID WC   loratadine  10 mg Oral QHS   metoprolol tartrate  12.5 mg Oral BID   pantoprazole  40 mg Oral Daily   tamsulosin  0.4 mg Oral QHS   vitamin B-12  100 mcg Oral Daily   Continuous Infusions:  PRN Meds: acetaminophen, nitroGLYCERIN, ondansetron (ZOFRAN) IV   Vital Signs    Vitals:   09/21/21 0103 09/21/21 0439 09/21/21 0900 09/21/21 0921  BP: 133/70 140/80  133/89  Pulse: 77 76  75  Resp: '18 14  19  '$ Temp: 98 F (36.7 C) 98 F (36.7 C) 98 F (36.7 C) 98.1 F (36.7 C)  TempSrc: Oral Oral  Oral  SpO2: 98% 95% 91% 94%  Weight: 82.5 kg      No intake or output data in the 24 hours ending 09/21/21 1054     09/21/2021    1:03 AM 08/25/2021    2:15 PM 07/16/2021    3:14 PM  Last 3 Weights  Weight (lbs) 181 lb 14.4 oz 189 lb 189 lb 2 oz  Weight (kg) 82.509 kg 85.73 kg 85.787 kg      Telemetry    SR with some PVCs - Personally Reviewed  ECG    SR - Personally Reviewed  Physical Exam   GEN: No acute distress.   Neck: No JVD Cardiac: RRR, 2/6 mid to late peaking systolic ejection murmur across precordium Respiratory: Clear to auscultation bilaterally. GI: Soft, nontender, non-distended  MS: No edema; No deformity. Neuro:  Nonfocal  Psych: Normal affect   Labs    High Sensitivity Troponin:   Recent Labs  Lab 09/19/21 2103 09/19/21 2200  TROPONINIHS 9 9     Chemistry Recent Labs  Lab 09/19/21 2103 09/19/21 2200 09/20/21 0840  NA 138  --  138  K 4.1  --  4.0  CL 105  --   104  CO2 27  --  25  GLUCOSE 120*  --  129*  BUN 23  --  22  CREATININE 1.35* 1.33* 1.23  CALCIUM 9.7  --  9.3  PROT 6.2*  --   --   ALBUMIN 3.6  --   --   AST 17  --   --   ALT 21  --   --   ALKPHOS 69  --   --   BILITOT 0.8  --   --   GFRNONAA 54* 55* >60  ANIONGAP 6  --  9    Lipids No results for input(s): "CHOL", "TRIG", "HDL", "LABVLDL", "LDLCALC", "CHOLHDL" in the last 168 hours.  Hematology Recent Labs  Lab 09/19/21 2103 09/19/21 2200  WBC 7.3 7.1  RBC 3.82* 3.76*  HGB 11.2* 11.1*  HCT 33.1* 33.3*  MCV 86.6 88.6  MCH 29.3 29.5  MCHC 33.8 33.3  RDW 12.5 12.7  PLT 191 191   Thyroid No results for input(s): "TSH", "FREET4" in the last 168 hours.  BNPNo  results for input(s): "BNP", "PROBNP" in the last 168 hours.  DDimer No results for input(s): "DDIMER" in the last 168 hours.   Radiology    ECHOCARDIOGRAM COMPLETE  Result Date: 09/20/2021    ECHOCARDIOGRAM REPORT   Patient Name:   Andrew Malone Sr. Date of Exam: 09/20/2021 Medical Rec #:  [id number]           Height:       70.5 in Accession #:    [id number]          Weight:       189.0 lb Date of Birth:  1940-03-20           BSA:          2.049 m Patient Age:    81 years            BP:           147/61 mmHg Patient Gender: M                   HR:           66 bpm. Exam Location:  Inpatient Procedure: 2D Echo, Cardiac Doppler, Color Doppler and Intracardiac            Opacification Agent Indications:    Abnormal ECG R94.31  History:        Patient has prior history of Echocardiogram examinations, most                 recent 04/16/2021. CAD, Stroke and Carotid Disease, Aortic Valve                 Disease, Signs/Symptoms:Murmur; Risk Factors:Hypertension,                 Dyslipidemia and Sleep Apnea. GERD.  Sonographer:    Darlina Sicilian RDCS Referring Phys: [id number] [place]  1. Left ventricular ejection fraction, by estimation, is 60 to 65%. The left ventricle has normal function. The left ventricle has  no regional wall motion abnormalities. There is severe asymmetric left ventricular hypertrophy of the basal-septal segment. Left ventricular diastolic parameters are consistent with Grade I diastolic dysfunction (impaired relaxation).  2. Right ventricular systolic function is normal. The right ventricular size is normal. There is normal pulmonary artery systolic pressure.  3. The mitral valve is normal in structure. No evidence of mitral valve regurgitation. No evidence of mitral stenosis. Moderate mitral annular calcification.  4. 2D aortic Valve area 0.9 cm2 but AVA from VTI 1.09 cm2. Diminished LV Stroke Volume Index at 33. DVI 0.38. The aortic valve is calcified. Aortic valve regurgitation is trivial. Likely Moderate aortic valve stenosis. Comparison(s): Slight increase in gradients from prior; for future studies, greatest gradients at [facility]. FINDINGS  Left Ventricle: Left ventricular ejection fraction, by estimation, is 60 to 65%. The left ventricle has normal function. The left ventricle has no regional wall motion abnormalities. The left ventricular internal cavity size was normal in size. There is  severe asymmetric left ventricular hypertrophy of the basal-septal segment. Left ventricular diastolic parameters are consistent with Grade I diastolic dysfunction (impaired relaxation). Right Ventricle: The right ventricular size is normal. No increase in right ventricular wall thickness. Right ventricular systolic function is normal. There is normal pulmonary artery systolic pressure. The tricuspid regurgitant velocity is 1.99 m/s, and  with an assumed right atrial pressure of 3 mmHg, the estimated right ventricular systolic pressure is 46.2 mmHg. Left Atrium: Left atrial size was normal  in size. Right Atrium: Right atrial size was normal in size. Pericardium: There is no evidence of pericardial effusion. Mitral Valve: The mitral valve is normal in structure. Moderate mitral annular calcification. No  evidence of mitral valve regurgitation. No evidence of mitral valve stenosis. Tricuspid Valve: The tricuspid valve is normal in structure. Tricuspid valve regurgitation is mild . No evidence of tricuspid stenosis. Aortic Valve: 2D aortic Valve area 0.9 cm2; Diminished LV Stroke Volume Index at 33. DVI 0.38. The aortic valve is calcified. Aortic valve regurgitation is trivial. Moderate aortic stenosis is present. Aortic valve mean gradient measures 22.0 mmHg. Aortic valve peak gradient measures 40.2 mmHg. Aortic valve area, by VTI measures 1.09 cm. Pulmonic Valve: The pulmonic valve was not well visualized. Pulmonic valve regurgitation is not visualized. No evidence of pulmonic stenosis. Aorta: The aortic root and ascending aorta are structurally normal, with no evidence of dilitation. IAS/Shunts: No atrial level shunt detected by color flow Doppler.  LEFT VENTRICLE PLAX 2D LVIDd:         4.30 cm   Diastology LVIDs:         2.90 cm   LV e' medial:    5.22 cm/s LV PW:         0.90 cm   LV E/e' medial:  15.2 LV IVS:        1.70 cm   LV e' lateral:   5.77 cm/s LVOT diam:     1.90 cm   LV E/e' lateral: 13.7 LV SV:         67 LV SV Index:   33 LVOT Area:     2.84 cm  RIGHT VENTRICLE RV S prime:     14.60 cm/s LEFT ATRIUM             Index        RIGHT ATRIUM          Index LA diam:        4.40 cm 2.15 cm/m   RA Area:     8.97 cm LA Vol (A2C):   36.2 ml 17.67 ml/m  RA Volume:   17.30 ml 8.45 ml/m LA Vol (A4C):   43.2 ml 21.09 ml/m LA Biplane Vol: 40.9 ml 19.97 ml/m  AORTIC VALVE AV Area (Vmax):    0.91 cm AV Area (Vmean):   0.99 cm AV Area (VTI):     1.09 cm AV Vmax:           317.00 cm/s AV Vmean:          204.333 cm/s AV VTI:            0.622 m AV Peak Grad:      40.2 mmHg AV Mean Grad:      22.0 mmHg LVOT Vmax:         102.00 cm/s LVOT Vmean:        71.500 cm/s LVOT VTI:          0.238 m LVOT/AV VTI ratio: 0.38  AORTA Ao Asc diam: 3.50 cm MITRAL VALVE                TRICUSPID VALVE MV Area (PHT): 2.80 cm      TR Peak grad:   15.8 mmHg MV Decel Time: 271 msec     TR Vmax:        199.00 cm/s MV E velocity: 79.10 cm/s MV A velocity: 119.00 cm/s  SHUNTS MV E/A ratio:  0.66  Systemic VTI:  0.24 m                             Systemic Diam: 1.90 cm Rudean Haskell MD Electronically signed by Rudean Haskell MD Signature Date/Time: 09/20/2021/1:29:50 PM    Final    DG Chest Port 1 View  Result Date: 09/19/2021 CLINICAL DATA:  Chest pain EXAM: PORTABLE CHEST 1 VIEW COMPARISON:  10/26/2016 FINDINGS: Heart size and pulmonary vascularity are normal. Segmental elevation of the right hemidiaphragm as seen previously. No change. Lungs are clear. No pleural effusions. No pneumothorax. Mediastinal contours appear intact. Calcification of the aorta. Degenerative changes in the spine and shoulders. IMPRESSION: No active disease. Electronically Signed   By: Lucienne Capers M.D.   On: 09/19/2021 20:10    Cardiac Studies   CCTA/CT FFR positive for LAD and RCA disease.  Echo:  1. Left ventricular ejection fraction, by estimation, is 60 to 65%. The  left ventricle has normal function. The left ventricle has no regional  wall motion abnormalities. There is severe asymmetric left ventricular  hypertrophy of the basal-septal  segment. Left ventricular diastolic parameters are consistent with Grade I  diastolic dysfunction (impaired relaxation).   2. Right ventricular systolic function is normal. The right ventricular  size is normal. There is normal pulmonary artery systolic pressure.   3. The mitral valve is normal in structure. No evidence of mitral valve  regurgitation. No evidence of mitral stenosis. Moderate mitral annular  calcification.   4. 2D aortic Valve area 0.9 cm2 but AVA from VTI 1.09 cm2. Diminished LV  Stroke Volume Index at 33. DVI 0.38. The aortic valve is calcified. Aortic  valve regurgitation is trivial. Likely Moderate aortic valve stenosis.   Comparison(s): Slight increase in  gradients from prior; for future  studies, greatest gradients at [facility].   Patient Profile     81 y.o. male pmh sx for aortic stenosis, CAD, carotid artery stenosis, hypertension, hyperlipidemia, CVA, OSA, GERD, who is being seen 09/19/2021 for the evaluation of chest pain.   Assessment & Plan    Principal Problem:   Chest pain Active Problems:   Dyslipidemia   Depression   Essential hypertension   Coronary atherosclerosis   PVD   GERD   Aortic stenosis, moderate   DM II (diabetes mellitus, type II), controlled (HCC)  -Chest pain with evidence on coronary CTA of obstructive hemodynamically significant LAD and RCA disease, outpatient plan would have been for coronary angiography as well, now presents with chest pain requiring inpatient management. -Continue aspirin, Plavix, atorvastatin 40 mg daily, metoprolol.  Patient has a history of multiple prior strokes and Plavix is a home med. - echo shows moderate AS, consistent with exam. -Coronary angiography Monday, consent performed.  Patient in agreement with plan.   INFORMED CONSENT: I have reviewed the risks, indications, and alternatives to cardiac catheterization, possible angioplasty, and stenting with the patient. Risks include but are not limited to bleeding, infection, vascular injury, stroke, myocardial infarction, arrhythmia, kidney injury, radiation-related injury in the case of prolonged fluoroscopy use, emergency cardiac surgery, and death. The patient understands the risks of serious complication is 1-2 in 6045 with diagnostic cardiac cath and 1-2% or less with angioplasty/stenting.    For questions or updates, please contact [place] Please consult [email or web address] for contact info under        Signed, Elouise Munroe, MD  09/21/2021, 10:54 AM

## 2021-09-22 ENCOUNTER — Encounter (HOSPITAL_COMMUNITY)
Admission: EM | Disposition: A | Discharge status: Home / Self Care | Payer: Self-pay | Source: Home / Self Care | Attending: Cardiology

## 2021-09-22 ENCOUNTER — Encounter (HOSPITAL_COMMUNITY): Payer: Self-pay | Admitting: Cardiovascular Disease

## 2021-09-22 ENCOUNTER — Other Ambulatory Visit (HOSPITAL_COMMUNITY): Payer: Self-pay

## 2021-09-22 ENCOUNTER — Other Ambulatory Visit: Payer: Self-pay | Admitting: Cardiology

## 2021-09-22 DIAGNOSIS — I200 Unstable angina: Principal | ICD-10-CM

## 2021-09-22 DIAGNOSIS — I350 Nonrheumatic aortic (valve) stenosis: Secondary | ICD-10-CM

## 2021-09-22 DIAGNOSIS — I2510 Atherosclerotic heart disease of native coronary artery without angina pectoris: Secondary | ICD-10-CM

## 2021-09-22 DIAGNOSIS — E785 Hyperlipidemia, unspecified: Secondary | ICD-10-CM

## 2021-09-22 DIAGNOSIS — I10 Essential (primary) hypertension: Secondary | ICD-10-CM

## 2021-09-22 DIAGNOSIS — R931 Abnormal findings on diagnostic imaging of heart and coronary circulation: Secondary | ICD-10-CM | POA: Diagnosis not present

## 2021-09-22 DIAGNOSIS — I2583 Coronary atherosclerosis due to lipid rich plaque: Secondary | ICD-10-CM

## 2021-09-22 HISTORY — PX: LEFT HEART CATH AND CORONARY ANGIOGRAPHY: CATH118249

## 2021-09-22 LAB — BASIC METABOLIC PANEL
Anion gap: 10 (ref 5–15)
BUN: 29 mg/dL — ABNORMAL HIGH (ref 8–23)
CO2: 24 mmol/L (ref 22–32)
Calcium: 9.2 mg/dL (ref 8.9–10.3)
Chloride: 105 mmol/L (ref 98–111)
Creatinine, Ser: 1.35 mg/dL — ABNORMAL HIGH (ref 0.61–1.24)
GFR, Estimated: 53 mL/min — ABNORMAL LOW (ref >60)
Glucose, Bld: 135 mg/dL — ABNORMAL HIGH (ref 70–99)
Potassium: 4.6 mmol/L (ref 3.5–5.1)
Sodium: 139 mmol/L (ref 135–145)

## 2021-09-22 LAB — CBC
HCT: 36.4 % — ABNORMAL LOW (ref 39.0–52.0)
Hemoglobin: 12.2 g/dL — ABNORMAL LOW (ref 13.0–17.0)
MCH: 29.5 pg (ref 26.0–34.0)
MCHC: 33.5 g/dL (ref 30.0–36.0)
MCV: 87.9 fL (ref 80.0–100.0)
Platelets: 185 10*3/uL (ref 150–400)
RBC: 4.14 MIL/uL — ABNORMAL LOW (ref 4.22–5.81)
RDW: 12.7 % (ref 11.5–15.5)
WBC: 6.2 10*3/uL (ref 4.0–10.5)
nRBC: 0 % (ref 0.0–0.2)

## 2021-09-22 LAB — GLUCOSE, CAPILLARY
Glucose-Capillary: 127 mg/dL — ABNORMAL HIGH (ref 70–99)
Glucose-Capillary: 112 mg/dL — ABNORMAL HIGH (ref 70–99)
Glucose-Capillary: 188 mg/dL — ABNORMAL HIGH (ref 70–99)

## 2021-09-22 SURGERY — LEFT HEART CATH AND CORONARY ANGIOGRAPHY
Anesthesia: LOCAL

## 2021-09-22 MED ORDER — IOHEXOL 350 MG/ML SOLN
INTRAVENOUS | Status: DC | PRN
  Administered 2021-09-22: 65 mL

## 2021-09-22 MED ORDER — FENTANYL CITRATE (PF) 100 MCG/2ML IJ SOLN
INTRAMUSCULAR | Status: AC
  Filled 2021-09-22: qty 2

## 2021-09-22 MED ORDER — VERAPAMIL HCL 2.5 MG/ML IV SOLN
INTRAVENOUS | Status: AC
Start: 2021-09-22 — End: ?
  Filled 2021-09-22: qty 2

## 2021-09-22 MED ORDER — SODIUM CHLORIDE 0.9 % IV SOLN: 250.0000 mL | INTRAVENOUS | Status: DC | PRN

## 2021-09-22 MED ORDER — HEPARIN (PORCINE) IN NACL 1000-0.9 UT/500ML-% IV SOLN
INTRAVENOUS | Status: DC | PRN
  Administered 2021-09-22 (×2): 500 mL

## 2021-09-22 MED ORDER — MIDAZOLAM HCL 2 MG/2ML IJ SOLN
INTRAMUSCULAR | Status: AC
  Filled 2021-09-22: qty 2

## 2021-09-22 MED ORDER — FENTANYL CITRATE (PF) 100 MCG/2ML IJ SOLN
INTRAMUSCULAR | Status: DC | PRN
  Administered 2021-09-22: 25 ug via INTRAVENOUS

## 2021-09-22 MED ORDER — LIDOCAINE HCL (PF) 1 % IJ SOLN
INTRAMUSCULAR | Status: AC
  Filled 2021-09-22: qty 30

## 2021-09-22 MED ORDER — NITROGLYCERIN 0.4 MG SL SUBL
0.4000 mg | SUBLINGUAL_TABLET | SUBLINGUAL | 12 refills | Status: AC | PRN
  Filled 2021-09-22: qty 25, 7d supply, fill #0

## 2021-09-22 MED ORDER — SODIUM CHLORIDE 0.9 % WEIGHT BASED INFUSION
1.0000 mL/kg/h | INTRAVENOUS | Status: DC
Start: 2021-09-22 — End: 2021-09-22

## 2021-09-22 MED ORDER — MIDAZOLAM HCL 2 MG/2ML IJ SOLN
INTRAMUSCULAR | Status: DC | PRN
  Administered 2021-09-22: 1 mg via INTRAVENOUS

## 2021-09-22 MED ORDER — HEPARIN SODIUM (PORCINE) 1000 UNIT/ML IJ SOLN
INTRAMUSCULAR | Status: AC
  Filled 2021-09-22: qty 10

## 2021-09-22 MED ORDER — SODIUM CHLORIDE 0.9% FLUSH: 3.0000 mL | INTRAVENOUS | Status: DC | PRN

## 2021-09-22 MED ORDER — HEPARIN (PORCINE) IN NACL 1000-0.9 UT/500ML-% IV SOLN
INTRAVENOUS | Status: AC
  Filled 2021-09-22: qty 1000

## 2021-09-22 MED ORDER — LIDOCAINE HCL (PF) 1 % IJ SOLN
INTRAMUSCULAR | Status: DC | PRN
  Administered 2021-09-22: 2 mL

## 2021-09-22 MED ORDER — HYDRALAZINE HCL 20 MG/ML IJ SOLN: 10.0000 mg | INTRAMUSCULAR | Status: AC | PRN

## 2021-09-22 MED ORDER — ASPIRIN 81 MG PO TBEC: 81.0000 mg | DELAYED_RELEASE_TABLET | Freq: Every day | ORAL | 12 refills | Status: AC

## 2021-09-22 MED ORDER — HEPARIN SODIUM (PORCINE) 1000 UNIT/ML IJ SOLN
INTRAMUSCULAR | Status: DC | PRN
  Administered 2021-09-22: 4500 [IU] via INTRAVENOUS

## 2021-09-22 MED ORDER — VERAPAMIL HCL 2.5 MG/ML IV SOLN
INTRAVENOUS | Status: DC | PRN
  Administered 2021-09-22: 10 mL via INTRA_ARTERIAL

## 2021-09-22 MED ORDER — LABETALOL HCL 5 MG/ML IV SOLN: 10.0000 mg | INTRAVENOUS | Status: AC | PRN

## 2021-09-22 MED ORDER — SODIUM CHLORIDE 0.9% FLUSH
3.0000 mL | Freq: Two times a day (BID) | INTRAVENOUS | Status: DC
Start: 2021-09-22 — End: 2021-09-22

## 2021-09-22 SURGICAL SUPPLY — 11 items
BAND ZEPHYR COMPRESS 30 LONG (HEMOSTASIS) ×1 IMPLANT
CATH 5FR JL3.5 JR4 ANG PIG MP (CATHETERS) ×1 IMPLANT
CATH INFINITI 5FR AL1 (CATHETERS) ×1 IMPLANT
GLIDESHEATH SLEND SS 6F .021 (SHEATH) ×1 IMPLANT
GUIDEWIRE INQWIRE 1.5J.035X260 (WIRE) IMPLANT
INQWIRE 1.5J .035X260CM (WIRE) ×2
KIT HEART LEFT (KITS) ×2 IMPLANT
PACK CARDIAC CATHETERIZATION (CUSTOM PROCEDURE TRAY) ×2 IMPLANT
TRANSDUCER W/STOPCOCK (MISCELLANEOUS) ×2 IMPLANT
TUBING CIL FLEX 10 FLL-RA (TUBING) ×2 IMPLANT
WIRE EMERALD ST .035X260CM (WIRE) ×1 IMPLANT

## 2021-09-22 NOTE — Interval H&P Note (Signed)
History and Physical Interval Note:  09/22/2021 10:31 AM  Andrew Boga Sr.  has presented today for surgery, with the diagnosis of unstable angina.  The various methods of treatment have been discussed with the patient and family. After consideration of risks, benefits and other options for treatment, the patient has consented to  Procedure(s): LEFT HEART CATH AND CORONARY ANGIOGRAPHY (N/A) as a surgical intervention.  The patient's history has been reviewed, patient examined, no change in status, stable for surgery.  I have reviewed the patient's chart and labs.  Questions were answered to the patient's satisfaction.     Sherren Mocha

## 2021-09-22 NOTE — Progress Notes (Signed)
Pt A/O x 4 and NSR on monitor. Adiited for chest pain see note. LHC performed See note. Denies pain and SOB . VSS. Education provided on meds and follow up care.

## 2021-09-22 NOTE — TOC Transition Note (Signed)
Transition of Care [facility]) - CM/SW Discharge Note   Patient Details  Name: Andrew KOZUB Sr. MRN: [id number] Date of Birth: Aug 02, 1940  [facility] CM/SW Contact:  Zenon Mayo, RN Phone Number: 09/22/2021, 12:06 PM   Clinical Narrative:    Patient is for dc today, he has no needs, TOC to fill medication.          Patient Goals and CMS Choice        Discharge Placement                       Discharge Plan and Services                                     Social Determinants of Health (SDOH) Interventions     Readmission Risk Interventions     No data to display

## 2021-09-22 NOTE — Discharge Summary (Signed)
Discharge Summary    [name]: Andrew BURNSWORTH Sr. MRN: [id number]; DOB: 1940-04-15  Admit date: 09/19/2021 Discharge date: 09/22/2021  PCP:  Cassandria Anger, MD   [facility] Providers Cardiologist:  Kirk Ruths, MD     Discharge Diagnoses    Principal Problem:   Chest pain Active Problems:   Dyslipidemia   Depression   Essential hypertension   Coronary atherosclerosis   PVD   GERD   Aortic stenosis, moderate   DM II (diabetes mellitus, type II), controlled ([place])   Abnormal cardiac CT angiography    Diagnostic Studies/Procedures    Echocardiogram 09/20/21  1. Left ventricular ejection fraction, by estimation, is 60 to 65%. The  left ventricle has normal function. The left ventricle has no regional  wall motion abnormalities. There is severe asymmetric left ventricular  hypertrophy of the basal-septal  segment. Left ventricular diastolic parameters are consistent with Grade I  diastolic dysfunction (impaired relaxation).   2. Right ventricular systolic function is normal. The right ventricular  size is normal. There is normal pulmonary artery systolic pressure.   3. The mitral valve is normal in structure. No evidence of mitral valve  regurgitation. No evidence of mitral stenosis. Moderate mitral annular  calcification.   4. 2D aortic Valve area 0.9 cm2 but AVA from VTI 1.09 cm2. Diminished LV  Stroke Volume Index at 33. DVI 0.38. The aortic valve is calcified. Aortic  valve regurgitation is trivial. Likely Moderate aortic valve stenosis.   Comparison(s): Slight increase in gradients from prior; for future  studies, greatest gradients at [facility].   Left Heart Catheterization 09/22/21 Calcified coronary artery disease with mild stenosis in the RCA, patent left main, mild-moderate LAD stenosis, patent left circumflex, and moderate ramus intermedius stenosis Calcified aortic valve with a mean gradient of 15 mmHg, peak gradient 19 mmHg, consistent with  moderate aortic stenosis Calcified mitral annulus   Recommend: medical therapy, outpatient surveillance of aortic stenosis Diagnostic Dominance: Right  _____________   History of Present Illness     Andrew Malone. is a 81 y.o. male with a past medical history of aortic stenosis, CAD, carotid artery stenosis, hypertension, hyperlipidemia, CVA, OSA, GERD who was seen on 09/19/2021 for the evaluation of chest pain.  Per chart review, patient's wife called the office on 08/04/2021 reporting that the patient had an episode of disorientation, leaning to the left side which lasted approximately 2 minutes.  Patient had carotid Dopplers on 08/14/2021 that showed patent distal CCA-mid ICA stent, 1-39% B ICA stenosis.  Patient had 2 more episodes, underwent coronary CT for further evaluation.  Coronary CT from 09/16/2021 showed AD RADS 4, with largest concern for obstructive disease in LAD/IM/mid RCA.  Study was sent for FFR that suggested obstructive RCA/LAD disease, I am not mottled.  Plan to proceed with angiography in the outpatient setting.  However, patient presented to the ED on 7/21 complaining of central chest pressure, mild shortness of breath, dizziness.  He denied any emesis or diaphoresis.  Was given aspirin and nitroglycerin prior to arrival by EMS with relief.  By the time cardiology evaluated patient, he was asymptomatic without chest pressure.  First troponin 9, no acute EKG changes.  As the plan was to perform heart catheterization in the outpatient setting, patient underwent left heart catheterization on 09/18/2021.  Hospital Course     Consultants: None  Chest Pain  CAD  - Patient presented complaining of central chest pressure. HsTn negative in the ED, but patient  recently had coronary CT scan on 7/18 that was concerning for obstructive disease  - Underwent LHC on 7/24 that showed calcified CAD with mild stenosis in the RCA, patent left main, mild-moderate LAD stenosis, patent left  circumflex, moderate ramus intermedius stenosis  - Continue daily ASA, plavix (plavix on board due to patient's history of stroke)  - Continue home metoprolol  - Continue home lipitor 40 mg daily for now. I have ordered an outpatient lipid panel to be drawn this week. Should increase lipitor dose at outpatient follow up if needed  - Ordered BMP to be drawn on Wednesday to check renal function post cath  - Patient has a follow up on 8/2 with general cardiology   HLD - Patient has not had a lipid panel drawn since 10/2020 - I have ordered an outpatient lipid panel to be drawn this week - Continue lipitor 40 mg daily for now. Further medication adjustments at outpatient follow up   HTN  - Continue home metoprolol  Aortic Stenosis  - Cath this admission with a calcified aortic valve, mean gradient 15 mmHg, peak gradient 19 mmHg, consistent with moderate aortic stenosis  - Continue routine outpatient monitoring   DM type 2  - Hold metformin for 48 hours after cath then resume home dose   Did the patient have an acute coronary syndrome (MI, NSTEMI, STEMI, etc) this admission?:  No                               Did the patient have a percutaneous coronary intervention (stent / angioplasty)?:  No.     Patient was seen and examined by Dr. Radford Pax and deemed stable for discharge.     _____________  Discharge Vitals Blood pressure 129/83, pulse 67, temperature 97.6 F (36.4 C), temperature source Oral, resp. rate 20, weight 83.2 kg, SpO2 95 %.  Filed Weights   09/21/21 0103 09/22/21 0514  Weight: 82.5 kg 83.2 kg    Labs & Radiologic Studies    CBC Recent Labs    09/19/21 2103 09/19/21 2200 09/21/21 1022 09/22/21 0639  WBC 7.3   < > 5.7 6.2  NEUTROABS 4.6  --   --   --   HGB 11.2*   < > 11.8* 12.2*  HCT 33.1*   < > 33.8* 36.4*  MCV 86.6   < > 85.8 87.9  PLT 191   < > 195 185   < > = values in this interval not displayed.   Basic Metabolic Panel Recent Labs    09/21/21 1022  09/22/21 0639  NA 137 139  K 4.1 4.6  CL 106 105  CO2 24 24  GLUCOSE 152* 135*  BUN 25* 29*  CREATININE 1.25* 1.35*  CALCIUM 9.6 9.2   Liver Function Tests Recent Labs    09/19/21 2103  AST 17  ALT 21  ALKPHOS 69  BILITOT 0.8  PROT 6.2*  ALBUMIN 3.6   No results for input(s): "LIPASE", "AMYLASE" in the last 72 hours. High Sensitivity Troponin:   Recent Labs  Lab 09/19/21 2103 09/19/21 2200  TROPONINIHS 9 9    BNP Invalid input(s): "POCBNP" D-Dimer No results for input(s): "DDIMER" in the last 72 hours. Hemoglobin A1C No results for input(s): "HGBA1C" in the last 72 hours. Fasting Lipid Panel No results for input(s): "CHOL", "HDL", "LDLCALC", "TRIG", "CHOLHDL", "LDLDIRECT" in the last 72 hours. Thyroid Function Tests No results for input(s): "TSH", "  T4TOTAL", "T3FREE", "THYROIDAB" in the last 72 hours.  Invalid input(s): "FREET3" _____________  CARDIAC CATHETERIZATION  Result Date: 09/22/2021 Calcified coronary artery disease with mild stenosis in the RCA, patent left main, mild-moderate LAD stenosis, patent left circumflex, and moderate ramus intermedius stenosis Calcified aortic valve with a mean gradient of 15 mmHg, peak gradient 19 mmHg, consistent with moderate aortic stenosis Calcified mitral annulus Recommend: medical therapy, outpatient surveillance of aortic stenosis   ECHOCARDIOGRAM COMPLETE  Result Date: 09/20/2021    ECHOCARDIOGRAM REPORT   Patient Name:   JAYCION TREML Sr. Date of Exam: 09/20/2021 Medical Rec #:  [id number]           Height:       70.5 in Accession #:    [id number]          Weight:       189.0 lb Date of Birth:  [birth year]           BSA:          2.049 m Patient Age:    35 years            BP:           147/61 mmHg Patient Gender: M                   HR:           66 bpm. Exam Location:  Inpatient Procedure: 2D Echo, Cardiac Doppler, Color Doppler and Intracardiac            Opacification Agent Indications:    Abnormal ECG R94.31  History:         Patient has prior history of Echocardiogram examinations, most                 recent 04/16/2021. CAD, Stroke and Carotid Disease, Aortic Valve                 Disease, Signs/Symptoms:Murmur; Risk Factors:Hypertension,                 Dyslipidemia and Sleep Apnea. GERD.  Sonographer:    Darlina Sicilian RDCS Referring Phys: [id number] [place]  1. Left ventricular ejection fraction, by estimation, is 60 to 65%. The left ventricle has normal function. The left ventricle has no regional wall motion abnormalities. There is severe asymmetric left ventricular hypertrophy of the basal-septal segment. Left ventricular diastolic parameters are consistent with Grade I diastolic dysfunction (impaired relaxation).  2. Right ventricular systolic function is normal. The right ventricular size is normal. There is normal pulmonary artery systolic pressure.  3. The mitral valve is normal in structure. No evidence of mitral valve regurgitation. No evidence of mitral stenosis. Moderate mitral annular calcification.  4. 2D aortic Valve area 0.9 cm2 but AVA from VTI 1.09 cm2. Diminished LV Stroke Volume Index at 33. DVI 0.38. The aortic valve is calcified. Aortic valve regurgitation is trivial. Likely Moderate aortic valve stenosis. Comparison(s): Slight increase in gradients from prior; for future studies, greatest gradients at [facility]. FINDINGS  Left Ventricle: Left ventricular ejection fraction, by estimation, is 60 to 65%. The left ventricle has normal function. The left ventricle has no regional wall motion abnormalities. The left ventricular internal cavity size was normal in size. There is  severe asymmetric left ventricular hypertrophy of the basal-septal segment. Left ventricular diastolic parameters are consistent with Grade I diastolic dysfunction (impaired relaxation). Right Ventricle: The right ventricular size is normal. No increase in right ventricular wall  thickness. Right ventricular systolic  function is normal. There is normal pulmonary artery systolic pressure. The tricuspid regurgitant velocity is 1.99 m/s, and  with an assumed right atrial pressure of 3 mmHg, the estimated right ventricular systolic pressure is 23.5 mmHg. Left Atrium: Left atrial size was normal in size. Right Atrium: Right atrial size was normal in size. Pericardium: There is no evidence of pericardial effusion. Mitral Valve: The mitral valve is normal in structure. Moderate mitral annular calcification. No evidence of mitral valve regurgitation. No evidence of mitral valve stenosis. Tricuspid Valve: The tricuspid valve is normal in structure. Tricuspid valve regurgitation is mild . No evidence of tricuspid stenosis. Aortic Valve: 2D aortic Valve area 0.9 cm2; Diminished LV Stroke Volume Index at 33. DVI 0.38. The aortic valve is calcified. Aortic valve regurgitation is trivial. Moderate aortic stenosis is present. Aortic valve mean gradient measures 22.0 mmHg. Aortic valve peak gradient measures 40.2 mmHg. Aortic valve area, by VTI measures 1.09 cm. Pulmonic Valve: The pulmonic valve was not well visualized. Pulmonic valve regurgitation is not visualized. No evidence of pulmonic stenosis. Aorta: The aortic root and ascending aorta are structurally normal, with no evidence of dilitation. IAS/Shunts: No atrial level shunt detected by color flow Doppler.  LEFT VENTRICLE PLAX 2D LVIDd:         4.30 cm   Diastology LVIDs:         2.90 cm   LV e' medial:    5.22 cm/s LV PW:         0.90 cm   LV E/e' medial:  15.2 LV IVS:        1.70 cm   LV e' lateral:   5.77 cm/s LVOT diam:     1.90 cm   LV E/e' lateral: 13.7 LV SV:         67 LV SV Index:   33 LVOT Area:     2.84 cm  RIGHT VENTRICLE RV S prime:     14.60 cm/s LEFT ATRIUM             Index        RIGHT ATRIUM          Index LA diam:        4.40 cm 2.15 cm/m   RA Area:     8.97 cm LA Vol (A2C):   36.2 ml 17.67 ml/m  RA Volume:   17.30 ml 8.45 ml/m LA Vol (A4C):   43.2 ml 21.09  ml/m LA Biplane Vol: 40.9 ml 19.97 ml/m  AORTIC VALVE AV Area (Vmax):    0.91 cm AV Area (Vmean):   0.99 cm AV Area (VTI):     1.09 cm AV Vmax:           317.00 cm/s AV Vmean:          204.333 cm/s AV VTI:            0.622 m AV Peak Grad:      40.2 mmHg AV Mean Grad:      22.0 mmHg LVOT Vmax:         102.00 cm/s LVOT Vmean:        71.500 cm/s LVOT VTI:          0.238 m LVOT/AV VTI ratio: 0.38  AORTA Ao Asc diam: 3.50 cm MITRAL VALVE                TRICUSPID VALVE MV Area (PHT): 2.80 cm     TR Peak  grad:   15.8 mmHg MV Decel Time: 271 msec     TR Vmax:        199.00 cm/s MV E velocity: 79.10 cm/s MV A velocity: 119.00 cm/s  SHUNTS MV E/A ratio:  0.66         Systemic VTI:  0.24 m                             Systemic Diam: 1.90 cm Rudean Haskell MD Electronically signed by Rudean Haskell MD Signature Date/Time: 09/20/2021/1:29:50 PM    Final    DG Chest Port 1 View  Result Date: 09/19/2021 CLINICAL DATA:  Chest pain EXAM: PORTABLE CHEST 1 VIEW COMPARISON:  10/26/2016 FINDINGS: Heart size and pulmonary vascularity are normal. Segmental elevation of the right hemidiaphragm as seen previously. No change. Lungs are clear. No pleural effusions. No pneumothorax. Mediastinal contours appear intact. Calcification of the aorta. Degenerative changes in the spine and shoulders. IMPRESSION: No active disease. Electronically Signed   By: Lucienne Capers M.D.   On: 09/19/2021 20:10   LONG TERM MONITOR-LIVE TELEMETRY (3-14 DAYS)  Result Date: 09/17/2021 Patch Wear Time:  14 days and 0 hours (2023-06-28T11:47:18-0400 to 2023-07-12T11:47:18-0400) Patient had a min HR of 52 bpm, max HR of 167 bpm, and avg HR of 73 bpm. Predominant underlying rhythm was Sinus Rhythm. 4 Supraventricular Tachycardia runs occurred, the run with the fastest interval lasting 12.6 secs with a max rate of 167 bpm (avg 130  bpm); the run with the fastest interval was also the longest. Isolated SVEs were rare (<1.0%), SVE Couplets were  rare (<1.0%), and SVE Triplets were rare (<1.0%). Isolated VEs were rare (<1.0%), VE Couplets were rare (<1.0%), and no VE Triplets were present. Ventricular Bigeminy and Trigeminy were present. Sinus bradycardia, normal sinus rhythm, sinus tachycardia, PACs, short runs of PAT, rare PVC and couplet. Kirk Ruths, MD  CT CORONARY [facility] W/CTA COR W/SCORE Lewanda Rife W/CM &/OR WO/CM  Addendum Date: 09/16/2021   ADDENDUM REPORT: 09/16/2021 17:05 EXAM: OVER-READ INTERPRETATION  CT CHEST The following report is an over-read performed by radiologist Dr. [facility] on 09/16/2021. This over-read does not include interpretation of cardiac or coronary anatomy or pathology. The coronary CTA interpretation by the cardiologist is attached. COMPARISON:  None. FINDINGS: The ascending thoracic aorta is normal in caliber.  No dissection. Prominent mediastinal lymph nodes but no change since 2008 study suggesting this is due to a benign process such as sarcoidosis or previous granulomas disease. The esophagus is grossly normal. No acute pulmonary findings. No worrisome pulmonary lesions or pulmonary nodules. No pleural effusions. No significant upper abdominal findings.  Small hiatal hernia. No significant bony findings. IMPRESSION: 1. No significant extracardiac findings. 2. Chronic borderline enlarged mediastinal and hilar lymph nodes likely related to previous granulomatous disease. Electronically Signed   By: Marijo Sanes M.D.   On: 09/16/2021 17:05   Result Date: 09/16/2021 CLINICAL DATA:  Chest pain EXAM: Cardiac CTA MEDICATIONS: Sub lingual nitro. '4mg'$  and lopressor '100mg'$  TECHNIQUE: The patient was scanned on a Siemens Force 518 slice scanner. Gantry rotation speed was 250 msecs. Collimation was .6 mm. A 100 kV prospective scan was triggered in the ascending thoracic aorta at 140 HU's Full mA was used between 35% and 75% of the R-R interval. Average HR during the scan was 57 bpm. The 3D data set was  interpreted on a dedicated work station using MPR, MIP and VRT modes.  A total of 80 cc of contrast was used. FINDINGS: Non-cardiac: See separate report from [facility]. No significant findings on limited lung and soft tissue windows. Calcium Score: Severe LM and 3 vessel coronary calcium LM: 299 LAD 729 LCX 391 RCA 477 Total: 1895 which is 34 th percentile for age/sex Coronary Arteries: Right dominant with no anomalies LM: 1-24% calcified plaque ostially LAD: 25-49% calcified plaque proximal mid and distal vessel IM: Large vessel multiple lesions 70-99% calcified plaque D1: Small vessel calcium at ostium from LAD lesion and distally D2: Small vessel normal Circumflex: 1-24% calcified plaque proximally 25-49% calcified plaque mid vessel OM1: Normal RCA: 1-24% calcified plaque proximally 50-69% calcified plaque mid vessel 25-49% calcified plaque distally PDA: Normal PLA: Normal IMPRESSION: 1. Calcium score 1895 LM/3 Vessel which is 85 th percentile for age/sex 2.  Normal ascending thoracic aorta 3.7 cm 3. CAD RADS 4 with largest concern for obstructive disease in LAD/IM /Mid RCA study sent for [facility] [place] Electronically Signed: By: Jenkins Rouge M.D. On: 09/16/2021 16:09   CT CORONARY FRACTIONAL FLOW RESERVE DATA PREP  Result Date: 09/16/2021 CLINICAL DATA:  CAD EXAM: FFR CT TECHNIQUE: The best systolic and diastolic phases of the patients gated cardiac CTA sent to heart flow for hemodynamic analysis . FINDINGS: LAD: Proximal 0.89, mid 0.79, distal 0.69 Does not appear that the IM was modeled RCA: Mid 0.81, distal 0.78 LCX Proximal 0.92, distal 0.90 IMPRESSION: FFR CT suggests obstructive RCA/LAD disease and IM not modeled Would proceed with angiography [place] Electronically Signed   By: [place] Rouge M.D.   On: 09/16/2021 16:14    Disposition   Pt is being discharged home today in good condition.  Follow-up Plans & Appointments     Follow-up Information     [facility]. Go on 09/24/2021.   Specialty: Cardiology Why: Please present to the [facility] Office during regular business hours on 7/26 to have labs drawn. Please fast prior to labs. Contact information: [address] [place] [phone]        Deberah Pelton, NP Follow up on 10/01/2021.   Specialty: Cardiology Why: Appointment at 10:05 AM Contact information: [address] [place] [address] [phone]                Discharge Instructions     Diet - low sodium heart healthy   Complete by: As directed    Discharge instructions   Complete by: As directed    You may resume metformin 48 hours after your cardiac catheterization (12:00 Noon on 7/26)  Radial Site Care Refer to this sheet in the next few weeks. These instructions provide you with information on caring for yourself after your procedure. Your caregiver may also give you more specific instructions. Your treatment has been planned according to current medical practices, but problems sometimes occur. Call your caregiver if you have any problems or questions after your procedure. HOME CARE INSTRUCTIONS You may shower the day after the procedure. Remove the bandage (dressing) and gently wash the site with plain soap and water. Gently pat the site dry.  Do not apply powder or lotion to the site.  Do not submerge the affected site in water for 3 to 5 days.  Inspect the site at least twice daily.  Do not flex or bend the affected arm for 24 hours.  No lifting over 5 pounds (2.3 kg) for 5 days after your procedure.  Do not drive home if you are discharged the same day of the procedure. Have someone else drive you.  You may drive 24 hours after the procedure unless otherwise instructed by your caregiver.  What to expect: Any bruising will usually fade within 1 to 2 weeks.  Blood that collects in the tissue (hematoma) may be painful to the touch. It should  usually decrease in size and tenderness within 1 to 2 weeks.  SEEK IMMEDIATE MEDICAL CARE IF: You have unusual pain at the radial site.  You have redness, warmth, swelling, or pain at the radial site.  You have drainage (other than a small amount of blood on the dressing).  You have chills.  You have a fever or persistent symptoms for more than 72 hours.  You have a fever and your symptoms suddenly get worse.  Your arm becomes pale, cool, tingly, or numb.  You have heavy bleeding from the site. Hold pressure on the site.   Please present to the [facility] on 7/26 to have fasting labs drawn.   Increase activity slowly   Complete by: As directed        Discharge Medications   Allergies as of 09/22/2021       Reactions   Empagliflozin Other (See Comments)   Bleeding in the groin   Lisinopril    cough   Trulicity [dulaglutide]         Medication List     STOP taking these medications    ipratropium 0.03 % nasal spray Commonly known as: ATROVENT   metoprolol tartrate 100 MG tablet Commonly known as: LOPRESSOR       TAKE these medications    acyclovir 200 MG capsule Commonly known as: ZOVIRAX TAKE 1 CAPSULE(200 MG) BY MOUTH TWICE DAILY What changed:  how much to take how to take this when to take this reasons to take this   aspirin EC 81 MG tablet Take 1 tablet (81 mg total) by mouth daily. Swallow whole. Start taking on: September 23, 2021   atorvastatin 40 MG tablet Commonly known as: LIPITOR TAKE 1 TABLET(40 MG) BY MOUTH DAILY What changed: See the new instructions.   Cholecalciferol 25 MCG (1000 UT) tablet Take 1,000 Units by mouth daily.   citalopram 10 MG tablet Commonly known as: CELEXA TAKE 1 TABLET(10 MG) BY MOUTH DAILY What changed: See the new instructions.   clopidogrel 75 MG tablet Commonly known as: PLAVIX TAKE 1 TABLET(75 MG) BY MOUTH DAILY What changed: See the new instructions.   IMODIUM A-D PO Take by mouth as needed.    metFORMIN 500 MG 24 hr tablet Commonly known as: GLUCOPHAGE-XR Take 1,000 mg by mouth daily with supper.   metoprolol succinate 25 MG 24 hr tablet Commonly known as: TOPROL-XL TAKE 1/2 TABLET BY MOUTH AT BEDTIME   nitroGLYCERIN 0.4 MG SL tablet Commonly known as: NITROSTAT Place 1 tablet (0.4 mg total) under the tongue every 5 (five) minutes x 3 doses as needed for chest pain.   pantoprazole 40 MG tablet Commonly known as: PROTONIX TAKE 1 TABLET(40 MG) BY MOUTH DAILY   tamsulosin 0.4 MG Caps capsule Commonly known as: FLOMAX TAKE 1 CAPSULE BY MOUTH EVERY NIGHT AT BEDTIME   Vitamin B-12 1000 MCG Subl Place 1 tablet (1,000 mcg total) under the tongue daily.           Outstanding Labs/Studies   Fasting Lipid Panel, BMP on 7/26   Duration of Discharge Encounter   Greater than 30  minutes including physician time.  Signed, Margie Billet, PA-C 09/22/2021, 11:57 AM

## 2021-09-22 NOTE — Progress Notes (Signed)
SATURATION QUALIFICATIONS: (This note is used to comply with regulatory documentation for home oxygen)  Patient Saturations on Room Air at Rest = 98%  Patient Saturations on Room Air while Ambulating = 96%  Patient Saturations on n/a Liters of oxygen while Ambulating = n/a%

## 2021-09-22 NOTE — Progress Notes (Signed)
R. Wrist TR band removed. Clean,dry, intact. No active bleeding or s/s of infection. Denies pain and SOB.

## 2021-09-22 NOTE — Progress Notes (Signed)
Mobility Specialist Progress Note:   09/22/21 1700  Mobility  Activity Ambulated with assistance in hallway  Level of Assistance Independent  Assistive Device None  Distance Ambulated (ft) 300 ft  Activity Response Tolerated well  $Mobility charge 1 Mobility   Pt received EOB willing to participate in mobility. No complaints of pain. Left EOB with call bell in reach and all needs met.   [facility] Andrew Malone Mobility Specialist

## 2021-09-23 ENCOUNTER — Encounter: Payer: Self-pay | Admitting: *Deleted

## 2021-09-23 ENCOUNTER — Telehealth: Payer: Self-pay | Admitting: *Deleted

## 2021-09-23 ENCOUNTER — Ambulatory Visit: Payer: Medicare Other | Admitting: General Practice

## 2021-09-23 NOTE — Patient Outreach (Signed)
  Care Coordination [facility] Note Transition Care Management Follow-up Telephone Call Date of discharge and from where: [facility] 09/22/21 How have you been since you were released from the hospital? "He's doing well" Any questions or concerns? No  Items Reviewed: Did the pt receive and understand the discharge instructions provided? Yes  Medications obtained and verified? Yes . Nitroglycerin and ASA Other? No  Any new allergies since your discharge? No  Dietary orders reviewed? Yes Do you have support at home? Yes   Home Care and Equipment/Supplies: Were home health services ordered? no If so, what is the name of the agency?  Has the agency set up a time to come to the patient's home? not applicable Were any new equipment or medical supplies ordered?  No What is the name of the medical supply agency?  Were you able to get the supplies/equipment? not applicable Do you have any questions related to the use of the equipment or supplies? No  Functional Questionnaire: (I = Independent and D = Dependent) ADLs: I  Bathing/Dressing- I  Meal Prep- I  Eating- I  Maintaining continence- I  Transferring/Ambulation- I  Managing Meds- I  Follow up appointments reviewed:  PCP Hospital f/u appt confirmed? Yes  Scheduled to see Dr Alain Marion on 10/06/21 @ 4:00. [facility] f/u appt confirmed? Yes  Scheduled to see Coletta Memos, NP on 10/01/21 @ 10:05. Are transportation arrangements needed? No  If their condition worsens, is the pt aware to call PCP or go to the Emergency Dept.? Yes Was the patient provided with contact information for the PCP's office or ED? Yes Was to pt encouraged to call back with questions or concerns? Yes  SDOH assessments and interventions completed:   Yes  Care Coordination Interventions Activated:  No Care Coordination Interventions:   N/A  Encounter Outcome:  Pt. Visit Completed  Chong Sicilian, BSN, RN-BC Care Management Coordinator [place] Management [phone]

## 2021-09-23 NOTE — Telephone Encounter (Signed)
Erroneous encounter. Please disregard.

## 2021-09-24 ENCOUNTER — Other Ambulatory Visit: Payer: Self-pay | Admitting: *Deleted

## 2021-09-24 ENCOUNTER — Other Ambulatory Visit (HOSPITAL_COMMUNITY)
Admission: RE | Admit: 2021-09-24 | Discharge: 2021-09-24 | Disposition: A | Discharge status: Home / Self Care | Payer: Medicare Other | Source: Ambulatory Visit | Attending: Cardiology | Admitting: Cardiology

## 2021-09-24 ENCOUNTER — Encounter: Payer: Self-pay | Admitting: *Deleted

## 2021-09-24 DIAGNOSIS — I2510 Atherosclerotic heart disease of native coronary artery without angina pectoris: Secondary | ICD-10-CM | POA: Insufficient documentation

## 2021-09-24 DIAGNOSIS — E785 Hyperlipidemia, unspecified: Secondary | ICD-10-CM | POA: Diagnosis not present

## 2021-09-24 LAB — BASIC METABOLIC PANEL
Anion gap: 6 (ref 5–15)
BUN: 27 mg/dL — ABNORMAL HIGH (ref 8–23)
CO2: 27 mmol/L (ref 22–32)
Calcium: 9.1 mg/dL (ref 8.9–10.3)
Chloride: 107 mmol/L (ref 98–111)
Creatinine, Ser: 1.57 mg/dL — ABNORMAL HIGH (ref 0.61–1.24)
GFR, Estimated: 45 mL/min — ABNORMAL LOW (ref >60)
Glucose, Bld: 155 mg/dL — ABNORMAL HIGH (ref 70–99)
Potassium: 4.6 mmol/L (ref 3.5–5.1)
Sodium: 140 mmol/L (ref 135–145)

## 2021-09-24 LAB — LIPID PANEL
Cholesterol: 118 mg/dL (ref 0–200)
HDL: 34 mg/dL — ABNORMAL LOW (ref >40)
LDL Cholesterol: 64 mg/dL (ref 0–99)
Total CHOL/HDL Ratio: 3.5 RATIO
Triglycerides: 99 mg/dL (ref <150)
VLDL: 20 mg/dL (ref 0–40)

## 2021-09-24 NOTE — Patient Outreach (Signed)
[facility]) Care Management  09/24/2021  Andrew FARVER Sr. Sep 13, 1940 [id number]  Nurse opened chart to [name] follow-up and discovered that a TOC was performed on 09/23/21. Chart was opened in error.   [name] RN, BSN [place] [phone] [name]

## 2021-09-25 ENCOUNTER — Telehealth: Payer: Self-pay

## 2021-09-25 NOTE — Telephone Encounter (Signed)
Spoke with pt. Pt was notified of results. Pt will continue his current medications and follow up as planned. Pt next at is 10/01/2021.
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
## 2021-09-29 ENCOUNTER — Other Ambulatory Visit: Payer: Self-pay | Admitting: Internal Medicine

## 2021-09-29 ENCOUNTER — Ambulatory Visit (INDEPENDENT_AMBULATORY_CARE_PROVIDER_SITE_OTHER): Payer: Medicare Other

## 2021-09-29 DIAGNOSIS — Z0000 Encounter for general adult medical examination without abnormal findings: Secondary | ICD-10-CM | POA: Diagnosis not present

## 2021-09-29 NOTE — Patient Instructions (Signed)
[name] , Thank you for taking time to come for your Medicare Wellness Visit. I appreciate your ongoing commitment to your health goals. Please review the following plan we discussed and let me know if I can assist you in the future.   Screening recommendations/referrals: Colonoscopy: no longer required  Recommended yearly ophthalmology/optometry visit for glaucoma screening and checkup Recommended yearly dental visit for hygiene and checkup  Vaccinations: Influenza vaccine: completed  Pneumococcal vaccine: completed  Tdap vaccine: 10/26/2019 Shingles vaccine: will consider     Advanced directives: none   Conditions/risks identified: none   Next appointment: none   Preventive Care 46 Years and Older, Male Preventive care refers to lifestyle choices and visits with your health care provider that can promote health and wellness. What does preventive care include? A yearly physical exam. This is also called an annual well check. Dental exams once or twice a year. Routine eye exams. Ask your health care provider how often you should have your eyes checked. Personal lifestyle choices, including: Daily care of your teeth and gums. Regular physical activity. Eating a healthy diet. Avoiding tobacco and drug use. Limiting alcohol use. Practicing safe sex. Taking low doses of aspirin every day. Taking vitamin and mineral supplements as recommended by your health care provider. What happens during an annual well check? The services and screenings done by your health care provider during your annual well check will depend on your age, overall health, lifestyle risk factors, and family history of disease. Counseling  Your health care provider may ask you questions about your: Alcohol use. Tobacco use. Drug use. Emotional well-being. Home and relationship well-being. Sexual activity. Eating habits. History of falls. Memory and ability to understand (cognition). Work and work  Statistician. Screening  You may have the following tests or measurements: Height, weight, and BMI. Blood pressure. Lipid and cholesterol levels. These may be checked every 5 years, or more frequently if you are over 64 years old. Skin check. Lung cancer screening. You may have this screening every year starting at age 14 if you have a 30-pack-year history of smoking and currently smoke or have quit within the past 15 years. Fecal occult blood test (FOBT) of the stool. You may have this test every year starting at age 63. Flexible sigmoidoscopy or colonoscopy. You may have a sigmoidoscopy every 5 years or a colonoscopy every 10 years starting at age 56. Prostate cancer screening. Recommendations will vary depending on your family history and other risks. Hepatitis C blood test. Hepatitis B blood test. Sexually transmitted disease (STD) testing. Diabetes screening. This is done by checking your blood sugar (glucose) after you have not eaten for a while (fasting). You may have this done every 1-3 years. Abdominal aortic aneurysm (AAA) screening. You may need this if you are a current or former smoker. Osteoporosis. You may be screened starting at age 65 if you are at high risk. Talk with your health care provider about your test results, treatment options, and if necessary, the need for more tests. Vaccines  Your health care provider may recommend certain vaccines, such as: Influenza vaccine. This is recommended every year. Tetanus, diphtheria, and acellular pertussis (Tdap, Td) vaccine. You may need a Td booster every 10 years. Zoster vaccine. You may need this after age 41. Pneumococcal 13-valent conjugate (PCV13) vaccine. One dose is recommended after age 35. Pneumococcal polysaccharide (PPSV23) vaccine. One dose is recommended after age 18. Talk to your health care provider about which screenings and vaccines you need and  how often you need them. This information is not intended to replace  advice given to you by your health care provider. Make sure you discuss any questions you have with your health care provider. Document Released: 03/15/2015 Document Revised: 11/06/2015 Document Reviewed: 12/18/2014 Elsevier Interactive Patient Education  2017 [employer] Prevention in the Home Falls can cause injuries. They can happen to people of all ages. There are many things you can do to make your home safe and to help prevent falls. What can I do on the outside of my home? Regularly fix the edges of walkways and driveways and fix any cracks. Remove anything that might make you trip as you walk through a door, such as a raised step or threshold. Trim any bushes or trees on the path to your home. Use bright outdoor lighting. Clear any walking paths of anything that might make someone trip, such as rocks or tools. Regularly check to see if handrails are loose or broken. Make sure that both sides of any steps have handrails. Any raised decks and porches should have guardrails on the edges. Have any leaves, snow, or ice cleared regularly. Use sand or salt on walking paths during winter. Clean up any spills in your garage right away. This includes oil or grease spills. What can I do in the bathroom? Use night lights. Install grab bars by the toilet and in the tub and shower. Do not use towel bars as grab bars. Use non-skid mats or decals in the tub or shower. If you need to sit down in the shower, use a plastic, non-slip stool. Keep the floor dry. Clean up any water that spills on the floor as soon as it happens. Remove soap buildup in the tub or shower regularly. Attach bath mats securely with double-sided non-slip rug tape. Do not have throw rugs and other things on the floor that can make you trip. What can I do in the bedroom? Use night lights. Make sure that you have a light by your bed that is easy to reach. Do not use any sheets or blankets that are too big for your bed.  They should not hang down onto the floor. Have a firm chair that has side arms. You can use this for support while you get dressed. Do not have throw rugs and other things on the floor that can make you trip. What can I do in the kitchen? Clean up any spills right away. Avoid walking on wet floors. Keep items that you use a lot in easy-to-reach places. If you need to reach something above you, use a strong step stool that has a grab bar. Keep electrical cords out of the way. Do not use floor polish or wax that makes floors slippery. If you must use wax, use non-skid floor wax. Do not have throw rugs and other things on the floor that can make you trip. What can I do with my stairs? Do not leave any items on the stairs. Make sure that there are handrails on both sides of the stairs and use them. Fix handrails that are broken or loose. Make sure that handrails are as long as the stairways. Check any carpeting to make sure that it is firmly attached to the stairs. Fix any carpet that is loose or worn. Avoid having throw rugs at the top or bottom of the stairs. If you do have throw rugs, attach them to the floor with carpet tape. Make sure that you have  a light switch at the top of the stairs and the bottom of the stairs. If you do not have them, ask someone to add them for you. What else can I do to help prevent falls? Wear shoes that: Do not have high heels. Have rubber bottoms. Are comfortable and fit you well. Are closed at the toe. Do not wear sandals. If you use a stepladder: Make sure that it is fully opened. Do not climb a closed stepladder. Make sure that both sides of the stepladder are locked into place. Ask someone to hold it for you, if possible. Clearly mark and make sure that you can see: Any grab bars or handrails. First and last steps. Where the edge of each step is. Use tools that help you move around (mobility aids) if they are needed. These  include: Canes. Walkers. Scooters. Crutches. Turn on the lights when you go into a dark area. Replace any light bulbs as soon as they burn out. Set up your furniture so you have a clear path. Avoid moving your furniture around. If any of your floors are uneven, fix them. If there are any pets around you, be aware of where they are. Review your medicines with your doctor. Some medicines can make you feel dizzy. This can increase your chance of falling. Ask your doctor what other things that you can do to help prevent falls. This information is not intended to replace advice given to you by your health care provider. Make sure you discuss any questions you have with your health care provider. Document Released: 12/13/2008 Document Revised: 07/25/2015 Document Reviewed: 03/23/2014 Elsevier Interactive Patient Education  2017 [employer].
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
## 2021-10-01 ENCOUNTER — Ambulatory Visit (INDEPENDENT_AMBULATORY_CARE_PROVIDER_SITE_OTHER): Payer: Medicare Other | Admitting: General Practice

## 2021-10-01 ENCOUNTER — Encounter: Payer: Self-pay | Admitting: General Practice

## 2021-10-01 VITALS — BP 92/56 | HR 66 | Ht 70.0 in | Wt 185.3 lb

## 2021-10-01 DIAGNOSIS — E7800 Pure hypercholesterolemia, unspecified: Secondary | ICD-10-CM

## 2021-10-01 DIAGNOSIS — I6523 Occlusion and stenosis of bilateral carotid arteries: Secondary | ICD-10-CM

## 2021-10-01 DIAGNOSIS — I2510 Atherosclerotic heart disease of native coronary artery without angina pectoris: Secondary | ICD-10-CM

## 2021-10-01 DIAGNOSIS — I10 Essential (primary) hypertension: Secondary | ICD-10-CM | POA: Diagnosis not present

## 2021-10-01 DIAGNOSIS — E785 Hyperlipidemia, unspecified: Secondary | ICD-10-CM

## 2021-10-01 LAB — BASIC METABOLIC PANEL
BUN/Creatinine Ratio: 20 (ref 10–24)
BUN: 32 mg/dL — ABNORMAL HIGH (ref 8–27)
CO2: 25 mmol/L (ref 20–29)
Calcium: 10.1 mg/dL (ref 8.6–10.2)
Chloride: 101 mmol/L (ref 96–106)
Creatinine, Ser: 1.64 mg/dL — ABNORMAL HIGH (ref 0.76–1.27)
Glucose: 132 mg/dL — ABNORMAL HIGH (ref 70–99)
Potassium: 5.3 mmol/L — ABNORMAL HIGH (ref 3.5–5.2)
Sodium: 139 mmol/L (ref 134–144)
eGFR: 42 mL/min/{1.73_m2} — ABNORMAL LOW (ref >59)

## 2021-10-01 MED ORDER — ATORVASTATIN CALCIUM 80 MG PO TABS: 80.0000 mg | ORAL_TABLET | Freq: Every day | ORAL | 6 refills | Status: DC

## 2021-10-01 NOTE — Patient Instructions (Addendum)
Medication Instructions:  MAKE SURE TO STOP LOSARTAN  INCREASE ATORVASTATIN '80MG'$  DAILY  *If you need a refill on your cardiac medications before your next appointment, please call your pharmacy*  Lab Work:    BMET TODAY  AND FASTING LIPID AND LFT IN 6 WEEKS (ABOUT 11-12-2021)  If you have labs (blood work) drawn today and your tests are completely normal, you will receive your results only by: [place] (if you have MyChart) OR  A paper copy in the mail If you have any lab test that is abnormal or we need to change your treatment, we will call you to review the results.  You may also go to any of these [facility] locations:  [place] [address],  [place] [address] ([facility])  [address]  [place] [address] ([employer])  Testing/Procedures:  [employer]  Special Instructions TAKE AND LOG YOUR BLOOD PRESSURE  MAINTAIN YOUR HYDRATION  Follow-Up: Your next appointment:  AS SCHEDULED  In Person with Kirk Ruths, MD     At [facility], you and your health needs are our priority.  As part of our continuing mission to provide you with exceptional heart care, we have created designated Provider Care Teams.  These Care Teams include your primary Cardiologist (physician) and Advanced Practice Providers (APPs -  Physician Assistants and Nurse Practitioners) who all work together to provide you with the care you need, when you need it.  We recommend signing up for the patient portal called "MyChart".  Sign up information is provided on this After Visit Summary.  MyChart is used to connect with patients for Virtual Visits (Telemedicine).  Patients are able to view lab/test results, encounter notes, upcoming  appointments, etc.  Non-urgent messages can be sent to your provider as well.   To learn more about what you can do with MyChart, go to [email or web address].    Important Information About Sugar

## 2021-10-02 ENCOUNTER — Other Ambulatory Visit: Payer: Self-pay

## 2021-10-02 DIAGNOSIS — I2510 Atherosclerotic heart disease of native coronary artery without angina pectoris: Secondary | ICD-10-CM

## 2021-10-02 DIAGNOSIS — E7800 Pure hypercholesterolemia, unspecified: Secondary | ICD-10-CM

## 2021-10-02 DIAGNOSIS — I6523 Occlusion and stenosis of bilateral carotid arteries: Secondary | ICD-10-CM

## 2021-10-02 DIAGNOSIS — E785 Hyperlipidemia, unspecified: Secondary | ICD-10-CM

## 2021-10-02 DIAGNOSIS — I10 Essential (primary) hypertension: Secondary | ICD-10-CM

## 2021-10-06 ENCOUNTER — Ambulatory Visit (INDEPENDENT_AMBULATORY_CARE_PROVIDER_SITE_OTHER): Payer: Medicare Other | Admitting: Internal Medicine

## 2021-10-06 ENCOUNTER — Encounter: Payer: Self-pay | Admitting: Internal Medicine

## 2021-10-06 VITALS — BP 128/70 | HR 75 | Temp 97.8°F | Ht 70.0 in | Wt 184.0 lb

## 2021-10-06 DIAGNOSIS — R29898 Other symptoms and signs involving the musculoskeletal system: Secondary | ICD-10-CM | POA: Insufficient documentation

## 2021-10-06 DIAGNOSIS — I200 Unstable angina: Secondary | ICD-10-CM

## 2021-10-06 DIAGNOSIS — E1142 Type 2 diabetes mellitus with diabetic polyneuropathy: Secondary | ICD-10-CM | POA: Diagnosis not present

## 2021-10-06 DIAGNOSIS — E538 Deficiency of other specified B group vitamins: Secondary | ICD-10-CM | POA: Diagnosis not present

## 2021-10-06 DIAGNOSIS — R5383 Other fatigue: Secondary | ICD-10-CM | POA: Diagnosis not present

## 2021-10-06 DIAGNOSIS — I6350 Cerebral infarction due to unspecified occlusion or stenosis of unspecified cerebral artery: Secondary | ICD-10-CM

## 2021-10-06 NOTE — Progress Notes (Signed)
Needs  Subjective:  [name]: Mina Carlisi., male    DOB: 07-20-1940  Age: 82 y.o. MRN: [id number]  CC: Hospitalization Follow-up (F/u from hospital visit on 7/21 for chest pressure. )   HPI [name] presents for CAD/CP episode C/o weak legs, no falls Per hx:  "Admit date: 09/19/2021 Discharge date: 09/22/2021   PCP:  Cassandria Anger, MD              [facility] Providers Cardiologist:  Kirk Ruths, MD       Discharge Diagnoses    Principal Problem:   Chest pain Active Problems:   Dyslipidemia   Depression   Essential hypertension   Coronary atherosclerosis   PVD   GERD   Aortic stenosis, moderate   DM II (diabetes mellitus, type II), controlled (HCC)   Abnormal cardiac CT angiography       Diagnostic Studies/Procedures    Echocardiogram 09/20/21  1. Left ventricular ejection fraction, by estimation, is 60 to 65%. The  left ventricle has normal function. The left ventricle has no regional  wall motion abnormalities. There is severe asymmetric left ventricular  hypertrophy of the basal-septal  segment. Left ventricular diastolic parameters are consistent with Grade I  diastolic dysfunction (impaired relaxation).   2. Right ventricular systolic function is normal. The right ventricular  size is normal. There is normal pulmonary artery systolic pressure.   3. The mitral valve is normal in structure. No evidence of mitral valve  regurgitation. No evidence of mitral stenosis. Moderate mitral annular  calcification.   4. 2D aortic Valve area 0.9 cm2 but AVA from VTI 1.09 cm2. Diminished LV  Stroke Volume Index at 33. DVI 0.38. The aortic valve is calcified. Aortic  valve regurgitation is trivial. Likely Moderate aortic valve stenosis.   Comparison(s): Slight increase in gradients from prior; for future  studies, greatest gradients at [facility].    Left Heart Catheterization 09/22/21 Calcified coronary artery disease with mild stenosis in  the RCA, patent left main, mild-moderate LAD stenosis, patent left circumflex, and moderate ramus intermedius stenosis Calcified aortic valve with a mean gradient of 15 mmHg, peak gradient 19 mmHg, consistent with moderate aortic stenosis Calcified mitral annulus   Recommend: medical therapy, outpatient surveillance of aortic stenosis Diagnostic Dominance: Right  _____________   History of Present Illness     Kameran Mcneese. is a 81 y.o. male with a past medical history of aortic stenosis, CAD, carotid artery stenosis, hypertension, hyperlipidemia, CVA, OSA, GERD who was seen on 09/19/2021 for the evaluation of chest pain.  Per chart review, patient's wife called the office on 08/04/2021 reporting that the patient had an episode of disorientation, leaning to the left side which lasted approximately 2 minutes.  Patient had carotid Dopplers on 08/14/2021 that showed patent distal CCA-mid ICA stent, 1-39% B ICA stenosis.  Patient had 2 more episodes, underwent coronary CT for further evaluation.  Coronary CT from 09/16/2021 showed AD RADS 4, with largest concern for obstructive disease in LAD/IM/mid RCA.  Study was sent for FFR that suggested obstructive RCA/LAD disease, I am not mottled.  Plan to proceed with angiography in the outpatient setting.  However, patient presented to the ED on 7/21 complaining of central chest pressure, mild shortness of breath, dizziness.  He denied any emesis or diaphoresis.  Was given aspirin and nitroglycerin prior to arrival by EMS with relief.  By the time cardiology evaluated patient, he was asymptomatic without chest pressure.  First troponin 9, no acute EKG changes.  As the plan was to perform heart catheterization in the outpatient setting, patient underwent left heart catheterization on 09/18/2021.   Hospital Course     Consultants: None   Chest Pain  CAD  - Patient presented complaining of central chest pressure. HsTn negative in the ED, but patient recently  had coronary CT scan on 7/18 that was concerning for obstructive disease  - Underwent LHC on 7/24 that showed calcified CAD with mild stenosis in the RCA, patent left main, mild-moderate LAD stenosis, patent left circumflex, moderate ramus intermedius stenosis  - Continue daily ASA, plavix (plavix on board due to patient's history of stroke)  - Continue home metoprolol  - Continue home lipitor 40 mg daily for now. I have ordered an outpatient lipid panel to be drawn this week. Should increase lipitor dose at outpatient follow up if needed  - Ordered BMP to be drawn on Wednesday to check renal function post cath  - Patient has a follow up on 8/2 with general cardiology    HLD - Patient has not had a lipid panel drawn since 10/2020 - I have ordered an outpatient lipid panel to be drawn this week - Continue lipitor 40 mg daily for now. Further medication adjustments at outpatient follow up    HTN  - Continue home metoprolol   Aortic Stenosis  - Cath this admission with a calcified aortic valve, mean gradient 15 mmHg, peak gradient 19 mmHg, consistent with moderate aortic stenosis  - Continue routine outpatient monitoring    DM type 2  - Hold metformin for 48 hours after cath then resume home dose    Did the patient have an acute coronary syndrome (MI, NSTEMI, STEMI, etc) this admission?:  No                               Did the patient have a percutaneous coronary intervention (stent / angioplasty)?:  No.      Patient was seen and examined by Dr. Radford Pax and deemed stable for discharge. "     Outpatient Medications Prior to Visit  Medication Sig Dispense Refill   ammonium lactate (LAC-HYDRIN) 12 % lotion APPLY TO THE TO THE AFFECTED AREA DAILY 400 g 0   aspirin EC 81 MG tablet Take 1 tablet (81 mg total) by mouth daily. Swallow whole. 30 tablet 12   atorvastatin (LIPITOR) 80 MG tablet Take 1 tablet (80 mg total) by mouth daily. 30 tablet 6   Cholecalciferol 1000 units tablet Take 1,000  Units by mouth daily.     citalopram (CELEXA) 10 MG tablet TAKE 1 TABLET(10 MG) BY MOUTH DAILY (Patient taking differently: Take 10 mg by mouth daily. TAKE 1 TABLET(10 MG) BY MOUTH DAILY) 90 tablet 3   clopidogrel (PLAVIX) 75 MG tablet TAKE 1 TABLET(75 MG) BY MOUTH DAILY (Patient taking differently: Take 75 mg by mouth daily.) 90 tablet 0   Cyanocobalamin (VITAMIN B-12) 1000 MCG SUBL Place 1 tablet (1,000 mcg total) under the tongue daily. 100 tablet 3   Loperamide HCl (IMODIUM A-D PO) Take by mouth as needed.     metFORMIN (GLUCOPHAGE-XR) 500 MG 24 hr tablet Take 1,000 mg by mouth daily with supper.   3   metoprolol succinate (TOPROL-XL) 25 MG 24 hr tablet TAKE 1/2 TABLET BY MOUTH AT BEDTIME 90 tablet 2   pantoprazole (PROTONIX) 40 MG tablet TAKE 1 TABLET(40 MG) BY  MOUTH DAILY 90 tablet 3   tamsulosin (FLOMAX) 0.4 MG CAPS capsule TAKE 1 CAPSULE BY MOUTH EVERY NIGHT AT BEDTIME 90 capsule 1   acyclovir (ZOVIRAX) 200 MG capsule TAKE 1 CAPSULE(200 MG) BY MOUTH TWICE DAILY (Patient not taking: Reported on 10/01/2021) 180 capsule 3   nitroGLYCERIN (NITROSTAT) 0.4 MG SL tablet Place 1 tablet (0.4 mg total) under the tongue every 5 (five) minutes x 3 doses as needed for chest pain. (Patient not taking: Reported on 10/01/2021) 25 tablet 12   No facility-administered medications prior to visit.    ROS: Review of Systems  Objective:  BP 128/70 (BP Location: Left Arm, Patient Position: Sitting, Cuff Size: Large)   Pulse 75   Temp 97.8 F (36.6 C) (Temporal)   Ht '5\' 10"'$  (1.778 m)   Wt 184 lb (83.5 kg)   SpO2 95%   BMI 26.40 kg/m   BP Readings from Last 3 Encounters:  10/06/21 128/70  10/01/21 (!) 92/56  09/22/21 135/74    Wt Readings from Last 3 Encounters:  10/06/21 184 lb (83.5 kg)  10/01/21 185 lb 4.8 oz (84.1 kg)  09/22/21 183 lb 6.8 oz (83.2 kg)    Physical Exam  Lab Results  Component Value Date   WBC 6.2 09/22/2021   HGB 12.2 (L) 09/22/2021   HCT 36.4 (L) 09/22/2021   PLT 185  09/22/2021   GLUCOSE 134 (H) 10/10/2021   CHOL 118 09/24/2021   TRIG 99 09/24/2021   HDL 34 (L) 09/24/2021   LDLDIRECT 51.0 07/24/2016   LDLCALC 64 09/24/2021   ALT 21 09/19/2021   AST 17 09/19/2021   NA 139 10/10/2021   K 4.1 10/10/2021   CL 107 10/10/2021   CREATININE 1.30 (H) 10/10/2021   BUN 25 (H) 10/10/2021   CO2 25 10/10/2021   TSH 1.66 06/13/2021   PSA 1.75 07/24/2016   INR 1.0 07/13/2018   HGBA1C 7.6 (H) 06/13/2021   MICROALBUR <0.7 10/31/2020    No results found.  Assessment & Plan:   Problem List Items Addressed This Visit     B12 deficiency    On B12      Relevant Orders   Vitamin B12   Cerebral artery occlusion with cerebral infarction ([place]) - Primary   Relevant Orders   CT HEAD WO CONTRAST (5MM)   Leg weakness, bilateral    New x 2 months -  ?etiology Reduce Lipitor Will check CK, aldolase Check head CT scan Check arterial Doppler      Relevant Orders   VAS [place] LOWER EXTREMITY ARTERIAL DUPLEX   CT HEAD WO CONTRAST (5MM)   CBC with Differential/Platelet   Comprehensive metabolic panel   TSH   CK   Aldolase   Sedimentation rate   Hemoglobin A1c   Vitamin B12   Polyneuropathy due to type 2 diabetes mellitus ([place])   Relevant Orders   Hemoglobin A1c   Other Visit Diagnoses     Other fatigue       Relevant Orders   CBC with Differential/Platelet   TSH   Cortisol         No orders of the defined types were placed in this encounter.     Follow-up: Return in about 4 weeks (around 11/03/2021) for a follow-up visit.  Walker Kehr, MD

## 2021-10-06 NOTE — Assessment & Plan Note (Signed)
On B12 

## 2021-10-06 NOTE — Assessment & Plan Note (Addendum)
New x 2 months -  ?etiology Reduce Lipitor Will check CK, aldolase Check head CT scan Check arterial Doppler

## 2021-10-10 ENCOUNTER — Other Ambulatory Visit (HOSPITAL_COMMUNITY)
Admission: RE | Admit: 2021-10-10 | Discharge: 2021-10-10 | Disposition: A | Discharge status: Home / Self Care | Payer: Medicare Other | Source: Ambulatory Visit | Attending: General Practice | Admitting: General Practice

## 2021-10-10 ENCOUNTER — Other Ambulatory Visit (HOSPITAL_COMMUNITY): Payer: Self-pay

## 2021-10-10 DIAGNOSIS — I2510 Atherosclerotic heart disease of native coronary artery without angina pectoris: Secondary | ICD-10-CM

## 2021-10-10 DIAGNOSIS — E785 Hyperlipidemia, unspecified: Secondary | ICD-10-CM | POA: Diagnosis not present

## 2021-10-10 DIAGNOSIS — I6523 Occlusion and stenosis of bilateral carotid arteries: Secondary | ICD-10-CM

## 2021-10-10 DIAGNOSIS — E7800 Pure hypercholesterolemia, unspecified: Secondary | ICD-10-CM | POA: Diagnosis not present

## 2021-10-10 DIAGNOSIS — I10 Essential (primary) hypertension: Secondary | ICD-10-CM

## 2021-10-10 LAB — BASIC METABOLIC PANEL
Anion gap: 7 (ref 5–15)
BUN: 25 mg/dL — ABNORMAL HIGH (ref 8–23)
CO2: 25 mmol/L (ref 22–32)
Calcium: 9.8 mg/dL (ref 8.9–10.3)
Chloride: 107 mmol/L (ref 98–111)
Creatinine, Ser: 1.3 mg/dL — ABNORMAL HIGH (ref 0.61–1.24)
GFR, Estimated: 56 mL/min — ABNORMAL LOW (ref >60)
Glucose, Bld: 134 mg/dL — ABNORMAL HIGH (ref 70–99)
Potassium: 4.1 mmol/L (ref 3.5–5.1)
Sodium: 139 mmol/L (ref 135–145)

## 2021-10-14 ENCOUNTER — Other Ambulatory Visit: Payer: Self-pay | Admitting: Internal Medicine

## 2021-10-14 DIAGNOSIS — E7800 Pure hypercholesterolemia, unspecified: Secondary | ICD-10-CM

## 2021-10-17 ENCOUNTER — Other Ambulatory Visit (HOSPITAL_COMMUNITY)
Admission: RE | Admit: 2021-10-17 | Discharge: 2021-10-17 | Disposition: A | Discharge status: Home / Self Care | Payer: Medicare Other | Source: Ambulatory Visit | Attending: Internal Medicine | Admitting: Internal Medicine

## 2021-10-17 DIAGNOSIS — E538 Deficiency of other specified B group vitamins: Secondary | ICD-10-CM | POA: Diagnosis not present

## 2021-10-17 DIAGNOSIS — E1142 Type 2 diabetes mellitus with diabetic polyneuropathy: Secondary | ICD-10-CM | POA: Diagnosis not present

## 2021-10-17 DIAGNOSIS — R5383 Other fatigue: Secondary | ICD-10-CM | POA: Insufficient documentation

## 2021-10-17 DIAGNOSIS — R29898 Other symptoms and signs involving the musculoskeletal system: Secondary | ICD-10-CM | POA: Insufficient documentation

## 2021-10-17 LAB — CK: Total CK: 70 U/L (ref 49–397)

## 2021-10-17 LAB — CBC WITH DIFFERENTIAL/PLATELET
Abs Immature Granulocytes: 0.01 10*3/uL (ref 0.00–0.07)
Basophils Absolute: 0 10*3/uL (ref 0.0–0.1)
Basophils Relative: 1 %
Eosinophils Absolute: 0.2 10*3/uL (ref 0.0–0.5)
Eosinophils Relative: 4 %
HCT: 34.2 % — ABNORMAL LOW (ref 39.0–52.0)
Hemoglobin: 11.5 g/dL — ABNORMAL LOW (ref 13.0–17.0)
Immature Granulocytes: 0 %
Lymphocytes Relative: 25 %
Lymphs Abs: 1.3 10*3/uL (ref 0.7–4.0)
MCH: 29.5 pg (ref 26.0–34.0)
MCHC: 33.6 g/dL (ref 30.0–36.0)
MCV: 87.7 fL (ref 80.0–100.0)
Monocytes Absolute: 0.5 10*3/uL (ref 0.1–1.0)
Monocytes Relative: 9 %
Neutro Abs: 3.3 10*3/uL (ref 1.7–7.7)
Neutrophils Relative %: 61 %
Platelets: 202 10*3/uL (ref 150–400)
RBC: 3.9 MIL/uL — ABNORMAL LOW (ref 4.22–5.81)
RDW: 13.2 % (ref 11.5–15.5)
WBC: 5.4 10*3/uL (ref 4.0–10.5)
nRBC: 0 % (ref 0.0–0.2)

## 2021-10-17 LAB — COMPREHENSIVE METABOLIC PANEL
ALT: 22 U/L (ref 0–44)
AST: 16 U/L (ref 15–41)
Albumin: 3.8 g/dL (ref 3.5–5.0)
Alkaline Phosphatase: 65 U/L (ref 38–126)
Anion gap: 5 (ref 5–15)
BUN: 20 mg/dL (ref 8–23)
CO2: 27 mmol/L (ref 22–32)
Calcium: 9 mg/dL (ref 8.9–10.3)
Chloride: 108 mmol/L (ref 98–111)
Creatinine, Ser: 1.15 mg/dL (ref 0.61–1.24)
GFR, Estimated: >60 mL/min (ref >60)
Glucose, Bld: 128 mg/dL — ABNORMAL HIGH (ref 70–99)
Potassium: 4 mmol/L (ref 3.5–5.1)
Sodium: 140 mmol/L (ref 135–145)
Total Bilirubin: 0.6 mg/dL (ref 0.3–1.2)
Total Protein: 6.8 g/dL (ref 6.5–8.1)

## 2021-10-17 LAB — TSH: TSH: 0.882 u[IU]/mL (ref 0.350–4.500)

## 2021-10-17 LAB — HEMOGLOBIN A1C
Hgb A1c MFr Bld: 6.8 % — ABNORMAL HIGH (ref 4.8–5.6)
Mean Plasma Glucose: 148.46 mg/dL

## 2021-10-17 LAB — VITAMIN B12: Vitamin B-12: 856 pg/mL (ref 180–914)

## 2021-10-17 LAB — SEDIMENTATION RATE: Sed Rate: 25 mm/hr — ABNORMAL HIGH (ref 0–16)

## 2021-10-17 LAB — CORTISOL: Cortisol, Plasma: 12.4 ug/dL

## 2021-10-22 ENCOUNTER — Ambulatory Visit
Admission: RE | Admit: 2021-10-22 | Discharge: 2021-10-22 | Disposition: A | Discharge status: Home / Self Care | Payer: Medicare Other | Source: Ambulatory Visit | Attending: Internal Medicine | Admitting: Internal Medicine

## 2021-10-22 ENCOUNTER — Other Ambulatory Visit: Payer: Medicare Other

## 2021-10-22 DIAGNOSIS — I6350 Cerebral infarction due to unspecified occlusion or stenosis of unspecified cerebral artery: Secondary | ICD-10-CM

## 2021-10-22 DIAGNOSIS — R531 Weakness: Secondary | ICD-10-CM | POA: Diagnosis not present

## 2021-10-22 DIAGNOSIS — R29898 Other symptoms and signs involving the musculoskeletal system: Secondary | ICD-10-CM

## 2021-10-23 ENCOUNTER — Ambulatory Visit (HOSPITAL_COMMUNITY): Payer: Medicare Other

## 2021-10-29 ENCOUNTER — Ambulatory Visit (HOSPITAL_COMMUNITY)
Admission: RE | Admit: 2021-10-29 | Discharge: 2021-10-29 | Disposition: A | Discharge status: Home / Self Care | Payer: Medicare Other | Source: Ambulatory Visit | Attending: Internal Medicine | Admitting: Internal Medicine

## 2021-10-29 DIAGNOSIS — R29898 Other symptoms and signs involving the musculoskeletal system: Secondary | ICD-10-CM | POA: Diagnosis not present

## 2021-10-31 ENCOUNTER — Encounter: Payer: Self-pay | Admitting: Internal Medicine

## 2021-10-31 ENCOUNTER — Ambulatory Visit (INDEPENDENT_AMBULATORY_CARE_PROVIDER_SITE_OTHER): Payer: Medicare Other | Admitting: Internal Medicine

## 2021-10-31 VITALS — BP 134/68 | HR 67 | Temp 98.9°F | Ht 70.0 in | Wt 190.0 lb

## 2021-10-31 DIAGNOSIS — I2510 Atherosclerotic heart disease of native coronary artery without angina pectoris: Secondary | ICD-10-CM

## 2021-10-31 DIAGNOSIS — I200 Unstable angina: Secondary | ICD-10-CM

## 2021-10-31 DIAGNOSIS — E1159 Type 2 diabetes mellitus with other circulatory complications: Secondary | ICD-10-CM | POA: Diagnosis not present

## 2021-10-31 DIAGNOSIS — I2583 Coronary atherosclerosis due to lipid rich plaque: Secondary | ICD-10-CM | POA: Diagnosis not present

## 2021-10-31 DIAGNOSIS — R29898 Other symptoms and signs involving the musculoskeletal system: Secondary | ICD-10-CM | POA: Diagnosis not present

## 2021-10-31 DIAGNOSIS — E1142 Type 2 diabetes mellitus with diabetic polyneuropathy: Secondary | ICD-10-CM | POA: Diagnosis not present

## 2021-10-31 DIAGNOSIS — E785 Hyperlipidemia, unspecified: Secondary | ICD-10-CM | POA: Diagnosis not present

## 2021-10-31 LAB — COMPREHENSIVE METABOLIC PANEL
ALT: 17 U/L (ref 0–53)
AST: 15 U/L (ref 0–37)
Albumin: 4.1 g/dL (ref 3.5–5.2)
Alkaline Phosphatase: 65 U/L (ref 39–117)
BUN: 20 mg/dL (ref 6–23)
CO2: 31 mEq/L (ref 19–32)
Calcium: 9.4 mg/dL (ref 8.4–10.5)
Chloride: 102 mEq/L (ref 96–112)
Creatinine, Ser: 1.22 mg/dL (ref 0.40–1.50)
GFR: 56.55 mL/min — ABNORMAL LOW (ref >60.00)
Glucose, Bld: 104 mg/dL — ABNORMAL HIGH (ref 70–99)
Potassium: 4.2 mEq/L (ref 3.5–5.1)
Sodium: 139 mEq/L (ref 135–145)
Total Bilirubin: 0.7 mg/dL (ref 0.2–1.2)
Total Protein: 6.9 g/dL (ref 6.0–8.3)

## 2021-10-31 LAB — LIPID PANEL
Cholesterol: 189 mg/dL (ref 0–200)
HDL: 32.3 mg/dL — ABNORMAL LOW (ref >39.00)
LDL Cholesterol: 119 mg/dL — ABNORMAL HIGH (ref 0–99)
NonHDL: 157.15
Total CHOL/HDL Ratio: 6
Triglycerides: 191 mg/dL — ABNORMAL HIGH (ref 0.0–149.0)
VLDL: 38.2 mg/dL (ref 0.0–40.0)

## 2021-10-31 LAB — HEMOGLOBIN A1C: Hgb A1c MFr Bld: 7 % — ABNORMAL HIGH (ref 4.6–6.5)

## 2021-10-31 LAB — CK: Total CK: 57 U/L (ref 7–232)

## 2021-10-31 MED ORDER — LIVALO 4 MG PO TABS: 4.0000 mg | ORAL_TABLET | Freq: Every day | ORAL | 11 refills | Status: DC

## 2021-10-31 NOTE — Assessment & Plan Note (Signed)
Leg weakness, burning - 75% better off Lipitor

## 2021-10-31 NOTE — Assessment & Plan Note (Signed)
Leg weakness, burning - 75% better off Lipitor Will try Livalo if not too $$$

## 2021-10-31 NOTE — Progress Notes (Signed)
Subjective:  [name]: [name], male    DOB: [birth year]  Age: 81 y.o. MRN: [id number]  CC: Follow-up (Discuss labs)   HPI LACOREY BRUSCA Sr. presents for leg weakness, burning - better off Lipitor  Outpatient Medications Prior to Visit  Medication Sig Dispense Refill  . acyclovir (ZOVIRAX) 200 MG capsule TAKE 1 CAPSULE(200 MG) BY MOUTH TWICE DAILY 180 capsule 3  . ammonium lactate (LAC-HYDRIN) 12 % lotion APPLY TO THE TO THE AFFECTED AREA DAILY 400 g 0  . aspirin EC 81 MG tablet Take 1 tablet (81 mg total) by mouth daily. Swallow whole. 30 tablet 12  . atorvastatin (LIPITOR) 80 MG tablet Take 1 tablet (80 mg total) by mouth daily. 30 tablet 6  . Cholecalciferol 1000 units tablet Take 1,000 Units by mouth daily.    . citalopram (CELEXA) 10 MG tablet TAKE 1 TABLET(10 MG) BY MOUTH DAILY (Patient taking differently: Take 10 mg by mouth daily. TAKE 1 TABLET(10 MG) BY MOUTH DAILY) 90 tablet 3  . clopidogrel (PLAVIX) 75 MG tablet TAKE 1 TABLET(75 MG) BY MOUTH DAILY (Patient taking differently: Take 75 mg by mouth daily.) 90 tablet 0  . Cyanocobalamin (VITAMIN B-12) 1000 MCG SUBL Place 1 tablet (1,000 mcg total) under the tongue daily. 100 tablet 3  . Loperamide HCl (IMODIUM A-D PO) Take by mouth as needed.    . metFORMIN (GLUCOPHAGE-XR) 500 MG 24 hr tablet Take 1,000 mg by mouth daily with supper.   3  . metoprolol succinate (TOPROL-XL) 25 MG 24 hr tablet TAKE 1/2 TABLET BY MOUTH AT BEDTIME 90 tablet 2  . nitroGLYCERIN (NITROSTAT) 0.4 MG SL tablet Place 1 tablet (0.4 mg total) under the tongue every 5 (five) minutes x 3 doses as needed for chest pain. 25 tablet 12  . pantoprazole (PROTONIX) 40 MG tablet TAKE 1 TABLET(40 MG) BY MOUTH DAILY 90 tablet 3  . tamsulosin (FLOMAX) 0.4 MG CAPS capsule TAKE 1 CAPSULE BY MOUTH EVERY NIGHT AT BEDTIME 90 capsule 1   No facility-administered medications prior to visit.    ROS: Review of Systems  Objective:  BP 134/68 (BP Location: Left  Arm, Patient Position: Sitting, Cuff Size: Large)   Pulse 67   Temp 98.9 F (37.2 C) (Oral)   Ht '5\' 10"'$  (1.778 m)   Wt 190 lb (86.2 kg)   SpO2 96%   BMI 27.26 kg/m   BP Readings from Last 3 Encounters:  10/31/21 134/68  10/06/21 128/70  10/01/21 (!) 92/56    Wt Readings from Last 3 Encounters:  10/31/21 190 lb (86.2 kg)  10/06/21 184 lb (83.5 kg)  10/01/21 185 lb 4.8 oz (84.1 kg)    Physical Exam  Lab Results  Component Value Date   WBC 5.4 10/17/2021   HGB 11.5 (L) 10/17/2021   HCT 34.2 (L) 10/17/2021   PLT 202 10/17/2021   GLUCOSE 128 (H) 10/17/2021   CHOL 118 09/24/2021   TRIG 99 09/24/2021   HDL 34 (L) 09/24/2021   LDLDIRECT 51.0 07/24/2016   LDLCALC 64 09/24/2021   ALT 22 10/17/2021   AST 16 10/17/2021   NA 140 10/17/2021   K 4.0 10/17/2021   CL 108 10/17/2021   CREATININE 1.15 10/17/2021   BUN 20 10/17/2021   CO2 27 10/17/2021   TSH 0.882 10/17/2021   PSA 1.75 07/24/2016   INR 1.0 07/13/2018   HGBA1C 6.8 (H) 10/17/2021   MICROALBUR <0.7 10/31/2020    VAS [place] LE ART SEG  MULTI (Segm&LE Reynauds)  Result Date: 10/30/2021  LOWER EXTREMITY DOPPLER STUDY Patient Name:  DERELL BRUUN SR.  Date of Exam:   10/29/2021 Medical Rec #: [id number]            Accession #:    [id number] Date of Birth: [birth year]            Patient Gender: M Patient Age:   15 years Exam Location:  [facility] Procedure:      VAS [place] LOWER EXT ART SEG MULTI (SEGMENTALS & LE RAYNAUDS) Referring Phys: Tyrone Apple Nari Vannatter --------------------------------------------------------------------------------  Indications: Patient reports leg weakness High Risk Factors: Hypertension, hyperlipidemia, Diabetes, no history of                    smoking.  Performing Technologist: Mariane Masters RVT  Examination Guidelines: A complete evaluation includes at minimum, Doppler waveform signals and systolic blood pressure reading at the level of bilateral brachial, anterior tibial, and posterior tibial arteries, when  vessel segments are accessible. Bilateral testing is considered an integral part of a complete examination. Photoelectric Plethysmograph (PPG) waveforms and toe systolic pressure readings are included as required and additional duplex testing as needed. Limited examinations for reoccurring indications may be performed as noted.  ABI Findings: +---------+------------------+-----+---------+--------+ Right    Rt Pressure (mmHg)IndexWaveform Comment  +---------+------------------+-----+---------+--------+ Brachial 186                                      +---------+------------------+-----+---------+--------+ CFA                             triphasic         +---------+------------------+-----+---------+--------+ Popliteal                       triphasic         +---------+------------------+-----+---------+--------+ PTA      211               1.11 triphasic         +---------+------------------+-----+---------+--------+ PERO     229               1.21 triphasic         +---------+------------------+-----+---------+--------+ DP       221               1.16 triphasic         +---------+------------------+-----+---------+--------+ Great Toe152               0.80 Normal            +---------+------------------+-----+---------+--------+ +---------+------------------+-----+---------+-------+ Left     Lt Pressure (mmHg)IndexWaveform Comment +---------+------------------+-----+---------+-------+ Brachial 190                                     +---------+------------------+-----+---------+-------+ CFA                             triphasic        +---------+------------------+-----+---------+-------+ Popliteal                       triphasic        +---------+------------------+-----+---------+-------+ PTA      207  1.09 triphasic        +---------+------------------+-----+---------+-------+ PERO     205               1.10 triphasic         +---------+------------------+-----+---------+-------+ DP       201               1.06 triphasic        +---------+------------------+-----+---------+-------+ Great Toe182               0.96 Normal           +---------+------------------+-----+---------+-------+ +-------+-----------+-----------+------------+------------+ ABI/TBIToday's ABIToday's TBIPrevious ABIPrevious TBI +-------+-----------+-----------+------------+------------+ Right  1.21       0.80                                +-------+-----------+-----------+------------+------------+ Left   1.10       0.96                                +-------+-----------+-----------+------------+------------+   Summary: Right: Resting right ankle-brachial index is within normal range. The right toe-brachial index is normal. Left: Resting left ankle-brachial index is within normal range. The left toe-brachial index is normal. *See table(s) above for measurements and observations.  Electronically signed by Carlyle Dolly MD on 10/30/2021 at 1:52:36 PM.    Final     Assessment & Plan:   Problem List Items Addressed This Visit   None     No orders of the defined types were placed in this encounter.     Follow-up: No follow-ups on file.  Walker Kehr, MD

## 2021-11-04 ENCOUNTER — Encounter: Payer: Self-pay | Admitting: Internal Medicine

## 2021-11-04 ENCOUNTER — Telehealth: Payer: Self-pay | Admitting: Cardiology

## 2021-11-04 ENCOUNTER — Other Ambulatory Visit: Payer: Self-pay

## 2021-11-04 ENCOUNTER — Other Ambulatory Visit (HOSPITAL_COMMUNITY)
Admission: RE | Admit: 2021-11-04 | Discharge: 2021-11-04 | Disposition: A | Discharge status: Home / Self Care | Payer: Medicare Other | Source: Ambulatory Visit | Attending: General Practice | Admitting: General Practice

## 2021-11-04 ENCOUNTER — Other Ambulatory Visit: Payer: Self-pay | Admitting: Internal Medicine

## 2021-11-04 ENCOUNTER — Ambulatory Visit: Payer: Medicare Other | Admitting: Internal Medicine

## 2021-11-04 DIAGNOSIS — E785 Hyperlipidemia, unspecified: Secondary | ICD-10-CM | POA: Insufficient documentation

## 2021-11-04 DIAGNOSIS — I10 Essential (primary) hypertension: Secondary | ICD-10-CM | POA: Diagnosis not present

## 2021-11-04 DIAGNOSIS — I2510 Atherosclerotic heart disease of native coronary artery without angina pectoris: Secondary | ICD-10-CM | POA: Diagnosis not present

## 2021-11-04 DIAGNOSIS — E7800 Pure hypercholesterolemia, unspecified: Secondary | ICD-10-CM | POA: Insufficient documentation

## 2021-11-04 DIAGNOSIS — I6523 Occlusion and stenosis of bilateral carotid arteries: Secondary | ICD-10-CM | POA: Diagnosis not present

## 2021-11-04 LAB — LIPID PANEL
Cholesterol: 210 mg/dL — ABNORMAL HIGH (ref 0–200)
HDL: 31 mg/dL — ABNORMAL LOW (ref >40)
LDL Cholesterol: 149 mg/dL — ABNORMAL HIGH (ref 0–99)
Total CHOL/HDL Ratio: 6.8 RATIO
Triglycerides: 149 mg/dL (ref <150)
VLDL: 30 mg/dL (ref 0–40)

## 2021-11-04 LAB — HEPATIC FUNCTION PANEL
ALT: 23 U/L (ref 0–44)
AST: 19 U/L (ref 15–41)
Albumin: 4.1 g/dL (ref 3.5–5.0)
Alkaline Phosphatase: 73 U/L (ref 38–126)
Bilirubin, Direct: 0.1 mg/dL (ref 0.0–0.2)
Indirect Bilirubin: 0.6 mg/dL (ref 0.3–0.9)
Total Bilirubin: 0.7 mg/dL (ref 0.3–1.2)
Total Protein: 7 g/dL (ref 6.5–8.1)

## 2021-11-04 NOTE — Telephone Encounter (Signed)
Patient calling in asking to speak the nurse.

## 2021-11-04 NOTE — Progress Notes (Signed)
Okay, patient may start pitavastatin as per PCP.  No further recommendations at this time. Jossie Ng. Zaria Taha NP-C     11/04/2021, 3:26 PM [address] Office [phone] Fax [phone]

## 2021-11-04 NOTE — Telephone Encounter (Signed)
Spoke with pt wife, questions regarding cholesterol medications and follow up appointment.
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
## 2021-11-12 ENCOUNTER — Telehealth: Payer: Self-pay | Admitting: Cardiology

## 2021-11-12 MED ORDER — LOSARTAN POTASSIUM-HCTZ 50-12.5 MG PO TABS: 1.0000 | ORAL_TABLET | Freq: Every day | ORAL | Status: DC

## 2021-11-12 NOTE — Telephone Encounter (Signed)
Spoke to patient's wife.She stated husband's B/P has been elevated ranging 159/76,159/79,172/75,133/64.He is taking Metoprolol 25 mg 1/2 tablet daily.She wanted to ask Dr.Crenshaw if he needs to restart Losartan/Hctz 50/12.5 mg before his appointment 9/18.I will send message to [facility] for advice.

## 2021-11-12 NOTE — Telephone Encounter (Signed)
Spoke with pt wife, Aware of dr crenshaw's recommendations.  

## 2021-11-12 NOTE — Telephone Encounter (Signed)
Pt c/o medication issue:  1. Name of Medication: losartan  2. How are you currently taking this medication (dosage and times per day)?    3. Are you having a reaction (difficulty breathing--STAT)? no  4. What is your medication issue? Wife called in to say that he was taken off this medication and has been to hospital since then. Wife is calling to see if she should start giving him the meds again. She states the top number of his bp has be 179. Please advise

## 2021-11-17 ENCOUNTER — Encounter: Payer: Self-pay | Admitting: Cardiology

## 2021-11-17 ENCOUNTER — Ambulatory Visit: Payer: Medicare Other | Attending: Cardiology | Admitting: Cardiology

## 2021-11-17 VITALS — BP 102/64 | HR 73 | Ht 70.5 in | Wt 185.0 lb

## 2021-11-17 DIAGNOSIS — I10 Essential (primary) hypertension: Secondary | ICD-10-CM | POA: Insufficient documentation

## 2021-11-17 DIAGNOSIS — I200 Unstable angina: Secondary | ICD-10-CM

## 2021-11-17 DIAGNOSIS — E7800 Pure hypercholesterolemia, unspecified: Secondary | ICD-10-CM | POA: Insufficient documentation

## 2021-11-17 DIAGNOSIS — I350 Nonrheumatic aortic (valve) stenosis: Secondary | ICD-10-CM | POA: Diagnosis not present

## 2021-11-17 DIAGNOSIS — I2510 Atherosclerotic heart disease of native coronary artery without angina pectoris: Secondary | ICD-10-CM | POA: Insufficient documentation

## 2021-11-17 NOTE — Patient Instructions (Signed)
  Follow-Up: At [facility], you and your health needs are our priority.  As part of our continuing mission to provide you with exceptional heart care, we have created designated Provider Care Teams.  These Care Teams include your primary Cardiologist (physician) and Advanced Practice Providers (APPs -  Physician Assistants and Nurse Practitioners) who all work together to provide you with the care you need, when you need it.  We recommend signing up for the patient portal called "MyChart".  Sign up information is provided on this After Visit Summary.  MyChart is used to connect with patients for Virtual Visits (Telemedicine).  Patients are able to view lab/test results, encounter notes, upcoming appointments, etc.  Non-urgent messages can be sent to your provider as well.   To learn more about what you can do with MyChart, go to [email or web address].    Your next appointment:   6 month(s)  The format for your next appointment:   In Person  Provider:   Brian Crenshaw, MD    

## 2021-11-18 ENCOUNTER — Telehealth: Payer: Self-pay | Admitting: Cardiology

## 2021-11-18 ENCOUNTER — Other Ambulatory Visit: Payer: Self-pay | Admitting: Cardiology

## 2021-11-18 MED ORDER — LOSARTAN POTASSIUM-HCTZ 50-12.5 MG PO TABS: 1.0000 | ORAL_TABLET | Freq: Every day | ORAL | 3 refills | Status: DC

## 2021-11-18 NOTE — Telephone Encounter (Signed)
*  STAT* If patient is at the pharmacy, call can be transferred to refill team.   1. Which medications need to be refilled? (please list name of each medication and dose if known)   losartan-hydrochlorothiazide (HYZAAR) 50-12.5 MG tablet  2. Which pharmacy/location (including street and city if local pharmacy) is medication to be sent to?  [id number] - [place], [address]  3. Do they need a 30 day or 90 day supply?  90 days  Wife stated patient only has 4 tablets left.

## 2021-11-26 ENCOUNTER — Telehealth: Payer: Self-pay | Admitting: Internal Medicine

## 2021-11-26 NOTE — Telephone Encounter (Signed)
PT's spouse calls today following up with Dr.Plotnikov. PT was notified to get back up with Dr.Plotnikov after checking on the price for their RX pitavastatin Calcium (LIVALO). The price for that RX currently is over $100. PT was notified by Dr.Plotnikov to call back if this was the case and he should be able to get it cheaper through another pharmacy.  CB: [phone]

## 2021-11-28 ENCOUNTER — Other Ambulatory Visit: Payer: Self-pay | Admitting: Internal Medicine

## 2021-11-28 MED ORDER — LIVALO 4 MG PO TABS: 4.0000 mg | ORAL_TABLET | Freq: Every day | ORAL | 3 refills | Status: DC

## 2021-11-28 NOTE — Telephone Encounter (Signed)
I will send a prescription to [facility] Thanks

## 2021-11-28 NOTE — Telephone Encounter (Signed)
Patient's wife is calling in stating he only has two pills left, and was supposed to here back from Dr.Plotnikov after he got back from vacation over a week ago. Wife is concerned that they are going to run out before the weekend.

## 2021-12-01 ENCOUNTER — Telehealth: Payer: Self-pay | Admitting: *Deleted

## 2021-12-01 NOTE — Telephone Encounter (Signed)
Spoke w/wife this am.../lmb

## 2021-12-01 NOTE — Telephone Encounter (Signed)
Rec'd msg from wife she called [facility], and they states they do not carry the Pitavastatin, but they do carry Zypitamag. Want to know if this med is ok to take. They still can get cheaper there.Marland KitchenJohny Chess

## 2021-12-02 NOTE — Telephone Encounter (Signed)
Notified pt/wife w/MD response../lmb 

## 2021-12-02 NOTE — Telephone Encounter (Signed)
Zypitamag is the same medication.  Please get it.  Thanks

## 2021-12-03 ENCOUNTER — Ambulatory Visit: Payer: Medicare Other

## 2021-12-16 ENCOUNTER — Ambulatory Visit (INDEPENDENT_AMBULATORY_CARE_PROVIDER_SITE_OTHER): Payer: Medicare Other | Admitting: Internal Medicine

## 2021-12-16 ENCOUNTER — Encounter: Payer: Self-pay | Admitting: Internal Medicine

## 2021-12-16 VITALS — BP 112/52 | HR 78 | Temp 98.3°F | Ht 70.5 in | Wt 187.2 lb

## 2021-12-16 DIAGNOSIS — Z23 Encounter for immunization: Secondary | ICD-10-CM | POA: Diagnosis not present

## 2021-12-16 DIAGNOSIS — E1159 Type 2 diabetes mellitus with other circulatory complications: Secondary | ICD-10-CM | POA: Diagnosis not present

## 2021-12-16 DIAGNOSIS — I739 Peripheral vascular disease, unspecified: Secondary | ICD-10-CM

## 2021-12-16 DIAGNOSIS — I200 Unstable angina: Secondary | ICD-10-CM

## 2021-12-16 DIAGNOSIS — R29898 Other symptoms and signs involving the musculoskeletal system: Secondary | ICD-10-CM | POA: Diagnosis not present

## 2021-12-16 DIAGNOSIS — E785 Hyperlipidemia, unspecified: Secondary | ICD-10-CM | POA: Diagnosis not present

## 2021-12-16 MED ORDER — CLOPIDOGREL BISULFATE 75 MG PO TABS
75.0000 mg | ORAL_TABLET | Freq: Every day | ORAL | 3 refills | Status: DC
Start: 2021-12-16 — End: 2022-12-09

## 2021-12-16 NOTE — Assessment & Plan Note (Signed)
Leg weakness, burning - resolved off Lipitor OK on Livalo

## 2021-12-16 NOTE — Assessment & Plan Note (Signed)
Leg weakness, burning - resolved off Lipitor

## 2021-12-16 NOTE — Assessment & Plan Note (Signed)
On Metformin Check A1c

## 2021-12-16 NOTE — Progress Notes (Signed)
Subjective:  [name]: Andrew Mechanic., male    DOB: 1940-12-14  Age: 81 y.o. MRN: [id number]  CC: Follow-up (3 MONTH F/U) and Medication Refill (need Plavix)   HPI Andrew Boga Sr. presents for leg weakness, burning - resolved off Lipitor F/u CAD - on Livalo now F/u HTN   Outpatient Medications Prior to Visit  Medication Sig Dispense Refill   acyclovir (ZOVIRAX) 200 MG capsule TAKE 1 CAPSULE(200 MG) BY MOUTH TWICE DAILY 180 capsule 3   ammonium lactate (LAC-HYDRIN) 12 % lotion APPLY TO THE TO THE AFFECTED AREA DAILY 400 g 0   aspirin EC 81 MG tablet Take 1 tablet (81 mg total) by mouth daily. Swallow whole. 30 tablet 12   Cholecalciferol 1000 units tablet Take 1,000 Units by mouth daily.     citalopram (CELEXA) 10 MG tablet TAKE 1 TABLET(10 MG) BY MOUTH DAILY (Patient taking differently: Take 10 mg by mouth daily. TAKE 1 TABLET(10 MG) BY MOUTH DAILY) 90 tablet 3   Cyanocobalamin (VITAMIN B-12) 1000 MCG SUBL Place 1 tablet (1,000 mcg total) under the tongue daily. 100 tablet 3   Loperamide HCl (IMODIUM A-D PO) Take by mouth as needed.     losartan-hydrochlorothiazide (HYZAAR) 50-12.5 MG tablet Take 1 tablet by mouth daily. 90 tablet 3   metFORMIN (GLUCOPHAGE-XR) 500 MG 24 hr tablet Take 1,000 mg by mouth daily with supper.   3   metoprolol succinate (TOPROL-XL) 25 MG 24 hr tablet TAKE 1/2 TABLET BY MOUTH AT BEDTIME 90 tablet 2   nitroGLYCERIN (NITROSTAT) 0.4 MG SL tablet Place 1 tablet (0.4 mg total) under the tongue every 5 (five) minutes x 3 doses as needed for chest pain. 25 tablet 12   pantoprazole (PROTONIX) 40 MG tablet TAKE 1 TABLET BY MOUTH EVERY DAY 90 tablet 3   tamsulosin (FLOMAX) 0.4 MG CAPS capsule TAKE 1 CAPSULE BY MOUTH EVERY NIGHT AT BEDTIME 90 capsule 1   ZYPITAMAG 4 MG TABS 1 PO QD 90 tablet 3   clopidogrel (PLAVIX) 75 MG tablet TAKE 1 TABLET(75 MG) BY MOUTH DAILY (Patient taking differently: Take 75 mg by mouth daily.) 90 tablet 0   No facility-administered  medications prior to visit.    ROS: Review of Systems  Constitutional:  Negative for appetite change, fatigue and unexpected weight change.  HENT:  Negative for congestion, nosebleeds, sneezing, sore throat and trouble swallowing.   Eyes:  Negative for itching and visual disturbance.  Respiratory:  Negative for cough.   Cardiovascular:  Negative for chest pain, palpitations and leg swelling.  Gastrointestinal:  Negative for abdominal distention, blood in stool, diarrhea and nausea.  Genitourinary:  Negative for frequency and hematuria.  Musculoskeletal:  Negative for back pain, gait problem, joint swelling and neck pain.  Skin:  Negative for rash.  Neurological:  Negative for dizziness, tremors, speech difficulty and weakness.  Psychiatric/Behavioral:  Negative for agitation, dysphoric mood and sleep disturbance. The patient is not nervous/anxious.     Objective:  BP (!) 112/52 (BP Location: Left Arm)   Pulse 78   Temp 98.3 F (36.8 C) (Oral)   Ht 5' 10.5" (1.791 m)   Wt 187 lb 3.2 oz (84.9 kg)   SpO2 95%   BMI 26.48 kg/m   BP Readings from Last 3 Encounters:  12/16/21 (!) 112/52  11/17/21 102/64  10/31/21 134/68    Wt Readings from Last 3 Encounters:  12/16/21 187 lb 3.2 oz (84.9 kg)  11/17/21 185 lb (83.9 kg)  10/31/21 190 lb (86.2 kg)    Physical Exam Constitutional:      General: He is not in acute distress.    Appearance: He is well-developed.     Comments: NAD  Eyes:     Conjunctiva/sclera: Conjunctivae normal.     Pupils: Pupils are equal, round, and reactive to light.  Neck:     Thyroid: No thyromegaly.     Vascular: No JVD.  Cardiovascular:     Rate and Rhythm: Normal rate and regular rhythm.     Heart sounds: Normal heart sounds. No murmur heard.    No friction rub. No gallop.  Pulmonary:     Effort: Pulmonary effort is normal. No respiratory distress.     Breath sounds: Normal breath sounds. No wheezing or rales.  Chest:     Chest wall: No  tenderness.  Abdominal:     General: Bowel sounds are normal. There is no distension.     Palpations: Abdomen is soft. There is no mass.     Tenderness: There is no abdominal tenderness. There is no guarding or rebound.  Musculoskeletal:        General: No tenderness. Normal range of motion.     Cervical back: Normal range of motion.  Lymphadenopathy:     Cervical: No cervical adenopathy.  Skin:    General: Skin is warm and dry.     Findings: No rash.  Neurological:     Mental Status: He is alert and oriented to person, place, and time.     Cranial Nerves: No cranial nerve deficit.     Motor: No abnormal muscle tone.     Coordination: Coordination normal.     Gait: Gait normal.     Deep Tendon Reflexes: Reflexes are normal and symmetric.  Psychiatric:        Behavior: Behavior normal.        Thought Content: Thought content normal.        Judgment: Judgment normal.     Lab Results  Component Value Date   WBC 5.4 10/17/2021   HGB 11.5 (L) 10/17/2021   HCT 34.2 (L) 10/17/2021   PLT 202 10/17/2021   GLUCOSE 104 (H) 10/31/2021   CHOL 210 (H) 11/04/2021   TRIG 149 11/04/2021   HDL 31 (L) 11/04/2021   LDLDIRECT 51.0 07/24/2016   LDLCALC 149 (H) 11/04/2021   ALT 23 11/04/2021   AST 19 11/04/2021   NA 139 10/31/2021   K 4.2 10/31/2021   CL 102 10/31/2021   CREATININE 1.22 10/31/2021   BUN 20 10/31/2021   CO2 31 10/31/2021   TSH 0.882 10/17/2021   PSA 1.75 07/24/2016   INR 1.0 07/13/2018   HGBA1C 7.0 (H) 10/31/2021   MICROALBUR <0.7 10/31/2020    No results found.  Assessment & Plan:   Problem List Items Addressed This Visit     DM II (diabetes mellitus, type II), controlled ([place])    On Metformin Check A1c      Dyslipidemia     Leg weakness, burning - resolved off Lipitor OK on Livalo      Leg weakness, bilateral     Leg weakness, burning - resolved off Lipitor      PVD     Leg weakness, burning - resolved off Lipitor OK on Livalo      Other  Visit Diagnoses     Needs flu shot    -  Primary   Relevant Orders   Flu Vaccine QUAD High  Dose(Fluad) (Completed)         Meds ordered this encounter  Medications   clopidogrel (PLAVIX) 75 MG tablet    Sig: Take 1 tablet (75 mg total) by mouth daily.    Dispense:  90 tablet    Refill:  3      Follow-up: Return in about 3 months (around 03/18/2022) for a follow-up visit.  Walker Kehr, MD

## 2021-12-30 ENCOUNTER — Other Ambulatory Visit: Payer: Self-pay | Admitting: Internal Medicine

## 2022-01-12 ENCOUNTER — Ambulatory Visit: Payer: Medicare Other | Admitting: Pulmonary Disease

## 2022-01-12 ENCOUNTER — Telehealth: Payer: Self-pay | Admitting: Pulmonary Disease

## 2022-01-12 NOTE — Telephone Encounter (Signed)
Okay to reschedule for week of 01/26/22.

## 2022-01-12 NOTE — Telephone Encounter (Signed)
Pt had to cancel appt for today.  Unable to wait until January for next available and would not go to [place] office.  Doesn't want to see APP.  Possible appt for week of 11/27?  Please advise.  Pt wasn't feeling well today.

## 2022-01-12 NOTE — Telephone Encounter (Signed)
Called and spoke to patients wife. She asked that we schedule patient for 11/28 in pm. Added patient to 11/28 at 1:45pm. Nothing further needed

## 2022-01-27 ENCOUNTER — Encounter: Payer: Self-pay | Admitting: Pulmonary Disease

## 2022-01-27 ENCOUNTER — Telehealth: Payer: Self-pay | Admitting: Pulmonary Disease

## 2022-01-27 ENCOUNTER — Ambulatory Visit (INDEPENDENT_AMBULATORY_CARE_PROVIDER_SITE_OTHER): Payer: Medicare Other | Admitting: Pulmonary Disease

## 2022-01-27 VITALS — BP 128/78 | HR 76 | Temp 98.2°F | Ht 70.5 in | Wt 191.8 lb

## 2022-01-27 DIAGNOSIS — J310 Chronic rhinitis: Secondary | ICD-10-CM

## 2022-01-27 DIAGNOSIS — G4733 Obstructive sleep apnea (adult) (pediatric): Secondary | ICD-10-CM | POA: Diagnosis not present

## 2022-01-27 DIAGNOSIS — I200 Unstable angina: Secondary | ICD-10-CM

## 2022-01-27 DIAGNOSIS — Z23 Encounter for immunization: Secondary | ICD-10-CM | POA: Diagnosis not present

## 2022-01-27 MED ORDER — IPRATROPIUM BROMIDE 0.03 % NA SOLN: 2.0000 | Freq: Three times a day (TID) | NASAL | 12 refills | Status: DC | PRN

## 2022-01-27 NOTE — Patient Instructions (Signed)
Atrovent nasal spray 3 times per day as needed for runny nose  Will arrange for CPAP titration study at [facility] are eligible for the Pneumonia 20 shot today  Follow up in 6 months

## 2022-01-27 NOTE — Addendum Note (Signed)
Addended by: Fritzi Mandes D on: 01/27/2022 02:33 PM   Modules accepted: Orders

## 2022-01-27 NOTE — Progress Notes (Signed)
[facility]  Chief Complaint  Patient presents with   Follow-up    Not using cpap states that it dries his mouth out and effects his breathing     Constitutional:  BP 128/78   Pulse 76   Temp 98.2 F (36.8 C)   Ht 5' 10.5" (1.791 m)   Wt 191 lb 12.8 oz (87 kg)   SpO2 95% Comment: ra  BMI 27.13 kg/m   Past Medical History:  CAD, Carotid artery disease, CVA, HLD, HTN, GERD, GN, HH, PUD, chronic back pain, Anxiety, Aortic stenosis, DM  Past Surgical History:  He  has a past surgical history that includes Cardiac catheterization (2007); Coronary angioplasty with stent (07/2011); Inguinal hernia repair (~ 2002); Hernia repair (~ 2002); TEE without cardioversion (08/11/2011); Upper gastrointestinal endoscopy; Colonoscopy; and LEFT HEART CATH AND CORONARY ANGIOGRAPHY (N/A, 09/22/2021).  Brief Summary:  Andrew Malone. is a 81 y.o. male with obstructive sleep apnea and short sleep cycle.      Subjective:   He is struggling with CPAP recently.  His machine is several years old.  He doesn't feel like the pressure is working correctly.  His mask doesn't fit well either.  He feels more relaxed after using CPAP and wants to continue therapy.  He continues to have a runny nose.  Previous use of atrovent helped this.  Physical Exam:   Appearance - well kempt   ENMT - no sinus tenderness, no oral exudate, no LAN, Mallampati 3 airway, no stridor  Respiratory - equal breath sounds bilaterally, no wheezing or rales  CV - s1s2 regular rate and rhythm, 2/6 SM  Ext - no clubbing, no edema  Skin - no rashes  Psych - normal mood and affect    Sleep Tests:  PSG 09/14/10>>AHI 8, SpO2 low 84% CPAP 11/02/20 to 12/01/20 >> used on 22 of 30 nights with average 6 hrs 45 min.  Average AHI 1.1 with CPAP 8 cm H2O  Cardiac Tests:  Echo 09/20/21 >> EF 60 to 65%, severe LVH, grade 1 DD, mod AS  Social History:  He  reports that he has never smoked.  He quit smokeless tobacco use about 46 years ago.  His smokeless tobacco use included chew. He reports that he does not drink alcohol and does not use drugs.  Family History:  His family history includes Breast cancer in his sister; Coronary artery disease in his mother; Diabetes in his brother, mother, and sister; Emphysema in his brother; Heart attack in his mother; Hyperlipidemia in his mother and sister; Hypertension in his mother and sister; Lung cancer in his father and sister; Stroke in his mother.     Assessment/Plan:   Obstructive sleep apnea. - he is compliant with CPAP and reports benefit - he uses [employer] for his DME - he is having trouble with mask fit and pressure setting - will arrange for CPAP titration study and then arrange for new CPAP machine with mask refitting  Gustatory and CPAP rhinitis. - refill atrovent nasal spray  Aortic stenosis, CAD s/p stent, HTN, HLD. - followed by Dr. Kirk Ruths with [facility]  Time Spent Involved in Patient Care on Day of Examination:  27 minutes  Follow up:   Patient Instructions  Atrovent nasal spray 3 times per day as needed for runny nose  Will arrange for CPAP titration study at [facility] are eligible for the Pneumonia 20 shot today  Follow up  in 6 months  Medication List:   Allergies as of 01/27/2022       Reactions   Empagliflozin Other (See Comments)   Bleeding in the groin   Lipitor [atorvastatin]    Leg weakness, burning - 75% better off Lipitor   Lisinopril    cough   Trulicity [dulaglutide]         Medication List        Accurate as of January 27, 2022  2:22 PM. If you have any questions, ask your nurse or doctor.          acyclovir 200 MG capsule Commonly known as: ZOVIRAX TAKE 1 CAPSULE(200 MG) BY MOUTH TWICE DAILY   ammonium lactate 12 % lotion Commonly known as: LAC-HYDRIN APPLY TO THE TO THE AFFECTED AREA DAILY   aspirin EC 81 MG tablet Take 1 tablet (81 mg total)  by mouth daily. Swallow whole.   Cholecalciferol 25 MCG (1000 UT) tablet Take 1,000 Units by mouth daily.   citalopram 10 MG tablet Commonly known as: CELEXA TAKE 1 TABLET(10 MG) BY MOUTH DAILY What changed: See the new instructions.   clopidogrel 75 MG tablet Commonly known as: PLAVIX Take 1 tablet (75 mg total) by mouth daily.   IMODIUM A-D PO Take by mouth as needed.   ipratropium 0.03 % nasal spray Commonly known as: ATROVENT Place 2 sprays into both nostrils 3 (three) times daily as needed for rhinitis. Started by: Chesley Mires, MD   losartan-hydrochlorothiazide 50-12.5 MG tablet Commonly known as: HYZAAR Take 1 tablet by mouth daily.   metFORMIN 500 MG 24 hr tablet Commonly known as: GLUCOPHAGE-XR Take 1,000 mg by mouth daily with supper.   metoprolol succinate 25 MG 24 hr tablet Commonly known as: TOPROL-XL TAKE 1/2 TABLET BY MOUTH AT BEDTIME   nitroGLYCERIN 0.4 MG SL tablet Commonly known as: NITROSTAT Place 1 tablet (0.4 mg total) under the tongue every 5 (five) minutes x 3 doses as needed for chest pain.   pantoprazole 40 MG tablet Commonly known as: PROTONIX TAKE 1 TABLET BY MOUTH EVERY DAY   tamsulosin 0.4 MG Caps capsule Commonly known as: FLOMAX TAKE 1 CAPSULE BY MOUTH EVERY NIGHT AT BEDTIME   Vitamin B-12 1000 MCG Subl Place 1 tablet (1,000 mcg total) under the tongue daily.   Zypitamag 4 MG Tabs Generic drug: Pitavastatin Magnesium 1 PO QD        Signature:  Chesley Mires, MD [place] Pager - [phone] 01/27/2022, 2:22 PM

## 2022-01-28 MED ORDER — IPRATROPIUM BROMIDE 0.03 % NA SOLN: 2.0000 | Freq: Three times a day (TID) | NASAL | 12 refills | Status: DC | PRN

## 2022-01-28 NOTE — Telephone Encounter (Signed)
Called and spoke with pt's spouse to clarify which medication needed to be sent to a different pharmacy and have sent Rx for Atrovent nasal spray to the correct pharmacy. Nothing further needed.

## 2022-02-11 ENCOUNTER — Other Ambulatory Visit: Payer: Self-pay

## 2022-02-11 ENCOUNTER — Emergency Department (HOSPITAL_COMMUNITY)
Admission: EM | Admit: 2022-02-11 | Discharge: 2022-02-11 | Disposition: A | Discharge status: Home / Self Care | Payer: Medicare Other | Attending: Emergency Medicine | Admitting: Emergency Medicine

## 2022-02-11 ENCOUNTER — Encounter (HOSPITAL_COMMUNITY): Payer: Self-pay

## 2022-02-11 DIAGNOSIS — Z7982 Long term (current) use of aspirin: Secondary | ICD-10-CM | POA: Diagnosis not present

## 2022-02-11 DIAGNOSIS — T162XXA Foreign body in left ear, initial encounter: Secondary | ICD-10-CM | POA: Insufficient documentation

## 2022-02-11 DIAGNOSIS — W4904XA Ring or other jewelry causing external constriction, initial encounter: Secondary | ICD-10-CM | POA: Diagnosis not present

## 2022-02-11 NOTE — ED Triage Notes (Signed)
Piece of hearing aid stuck in left ear.

## 2022-02-12 NOTE — ED Provider Notes (Signed)
Andrew Malone   CSN: [id number] Arrival date & time: 02/11/22  1814     History  Chief Complaint  Patient presents with   Andrew Body in [place]. is a 81 y.o. male with no contact contributory past medical history presenting for evaluation of a rubber earpiece from his hearing aid stuck in his left ear canal.  This occurred just prior to arrival when he was removing his hearing aid.  He denies pain in the ear and no other complaints.  No treatments prior to arrival.    The history is provided by the patient.  Andrew Body in [place] Medications Prior to Admission medications   Medication Sig Start Date End Date Taking? Authorizing Provider  acyclovir (ZOVIRAX) 200 MG capsule TAKE 1 CAPSULE(200 MG) BY MOUTH TWICE DAILY 06/13/21   Plotnikov, Evie Lacks, MD  ammonium lactate (LAC-HYDRIN) 12 % lotion APPLY TO THE TO THE AFFECTED AREA DAILY 09/29/21   Plotnikov, Evie Lacks, MD  aspirin EC 81 MG tablet Take 1 tablet (81 mg total) by mouth daily. Swallow whole. 09/23/21   Margie Billet, PA-C  Cholecalciferol 1000 units tablet Take 1,000 Units by mouth daily.    [provider]  citalopram (CELEXA) 10 MG tablet TAKE 1 TABLET(10 MG) BY MOUTH DAILY Patient taking differently: Take 10 mg by mouth daily. TAKE 1 TABLET(10 MG) BY MOUTH DAILY 04/25/21   Plotnikov, Evie Lacks, MD  clopidogrel (PLAVIX) 75 MG tablet Take 1 tablet (75 mg total) by mouth daily. 12/16/21   Plotnikov, Evie Lacks, MD  Cyanocobalamin (VITAMIN B-12) 1000 MCG SUBL Place 1 tablet (1,000 mcg total) under the tongue daily. 07/17/13   Plotnikov, Evie Lacks, MD  ipratropium (ATROVENT) 0.03 % nasal spray Place 2 sprays into both nostrils 3 (three) times daily as needed for rhinitis. 01/28/22   Chesley Mires, MD  Loperamide HCl (IMODIUM A-D PO) Take by mouth as needed.    [provider]  losartan-hydrochlorothiazide (HYZAAR) 50-12.5 MG tablet Take 1  tablet by mouth daily. 11/18/21   Lelon Perla, MD  metFORMIN (GLUCOPHAGE-XR) 500 MG 24 hr tablet Take 1,000 mg by mouth daily with supper.  04/01/15   [provider]  metoprolol succinate (TOPROL-XL) 25 MG 24 hr tablet TAKE 1/2 TABLET BY MOUTH AT BEDTIME 12/30/21   Plotnikov, Evie Lacks, MD  nitroGLYCERIN (NITROSTAT) 0.4 MG SL tablet Place 1 tablet (0.4 mg total) under the tongue every 5 (five) minutes x 3 doses as needed for chest pain. 09/22/21   Margie Billet, PA-C  pantoprazole (PROTONIX) 40 MG tablet TAKE 1 TABLET BY MOUTH EVERY DAY 11/04/21   Plotnikov, Evie Lacks, MD  tamsulosin (FLOMAX) 0.4 MG CAPS capsule TAKE 1 CAPSULE BY MOUTH EVERY NIGHT AT BEDTIME 08/06/21   Plotnikov, Evie Lacks, MD  ZYPITAMAG 4 MG TABS 1 PO QD 12/02/21   Plotnikov, Evie Lacks, MD      Allergies    Empagliflozin, Lipitor [atorvastatin], Lisinopril, and Trulicity [dulaglutide]    Review of Systems   Review of Systems  Constitutional:  Negative for chills and fever.  HENT:  Negative for ear pain and tinnitus.   Skin: Negative.  Negative for rash and wound.  All other systems reviewed and are negative.   Physical Exam Updated Vital Signs BP (!) 151/79 (BP Location: Left Arm)   Pulse 81   Temp 98 F (36.7 C) (Oral)   Resp  18   Ht 5' 10.5" (1.791 m)   Wt 87 kg   SpO2 98%   BMI 27.13 kg/m  Physical Exam Constitutional:      Appearance: He is well-developed.  HENT:     Head: Normocephalic and atraumatic.     Ears:     Comments: White plastic earpiece easily visualized within the left ear canal.    Nose: Mucosal edema present. No rhinorrhea.     Mouth/Throat:     Mouth: Mucous membranes are moist.     Pharynx: Oropharynx is clear. Uvula midline.     Tonsils: No tonsillar abscesses.  Eyes:     Conjunctiva/sclera: Conjunctivae normal.  Cardiovascular:     Rate and Rhythm: Normal rate.  Pulmonary:     Effort: Pulmonary effort is normal.  Skin:    General: Skin is warm and dry.   Neurological:     Mental Status: He is alert and oriented to person, place, and time.     ED Results / Procedures / Treatments   Labs (all labs ordered are listed, but only abnormal results are displayed) Labs Reviewed - No data to display  EKG None  Radiology No results found.  Procedures .Andrew Body Removal  Date/Time: 02/11/2022 9:30 PM  Performed by: Andrew Jefferson, PA-C Authorized by: Andrew Jefferson, PA-C  Consent given by: patient Patient understanding: patient states understanding of the procedure being performed Body area: ear Anesthesia method: None.  Anesthesia: Local anesthetic: None. Localization method: visualized Removal mechanism: curette Complexity: simple 1 objects recovered. Objects recovered: Rubber hearing aid earpiece Post-procedure assessment: Andrew body removed Patient tolerance: patient tolerated the procedure well with no immediate complications Comments: Ear canal visualized after procedure, no injury noted, mild erythema along the posterior canal wall.  TM intact.      Medications Ordered in ED Medications - No data to display  ED Course/ Medical Decision Making/ A&P                           Medical Decision Making Simple Andrew body from left TM removed, no complications, no follow-up anticipated.           Final Clinical Impression(s) / ED Diagnoses Final diagnoses:  Ear Andrew body, left, initial encounter    Rx / DC Orders ED Discharge Orders     None         Landis Martins 02/12/22 1558    Elgie Congo, MD 02/16/22 [id number]

## 2022-02-24 DIAGNOSIS — Z7984 Long term (current) use of oral hypoglycemic drugs: Secondary | ICD-10-CM | POA: Diagnosis not present

## 2022-02-24 DIAGNOSIS — E1142 Type 2 diabetes mellitus with diabetic polyneuropathy: Secondary | ICD-10-CM | POA: Diagnosis not present

## 2022-02-24 DIAGNOSIS — I779 Disorder of arteries and arterioles, unspecified: Secondary | ICD-10-CM | POA: Diagnosis not present

## 2022-02-24 DIAGNOSIS — R5383 Other fatigue: Secondary | ICD-10-CM | POA: Diagnosis not present

## 2022-02-24 DIAGNOSIS — I10 Essential (primary) hypertension: Secondary | ICD-10-CM | POA: Diagnosis not present

## 2022-02-27 ENCOUNTER — Other Ambulatory Visit (HOSPITAL_COMMUNITY)
Admission: RE | Admit: 2022-02-27 | Discharge: 2022-02-27 | Disposition: A | Discharge status: Home / Self Care | Payer: Medicare Other | Source: Ambulatory Visit | Attending: Cardiology | Admitting: Cardiology

## 2022-02-27 ENCOUNTER — Telehealth: Payer: Self-pay | Admitting: Cardiology

## 2022-02-27 DIAGNOSIS — I10 Essential (primary) hypertension: Secondary | ICD-10-CM | POA: Insufficient documentation

## 2022-02-27 DIAGNOSIS — Z79899 Other long term (current) drug therapy: Secondary | ICD-10-CM

## 2022-02-27 LAB — BASIC METABOLIC PANEL
Anion gap: 6 (ref 5–15)
BUN: 24 mg/dL — ABNORMAL HIGH (ref 8–23)
CO2: 25 mmol/L (ref 22–32)
Calcium: 9 mg/dL (ref 8.9–10.3)
Chloride: 104 mmol/L (ref 98–111)
Creatinine, Ser: 1.51 mg/dL — ABNORMAL HIGH (ref 0.61–1.24)
GFR, Estimated: 47 mL/min — ABNORMAL LOW (ref >60)
Glucose, Bld: 158 mg/dL — ABNORMAL HIGH (ref 70–99)
Potassium: 4.2 mmol/L (ref 3.5–5.1)
Sodium: 135 mmol/L (ref 135–145)

## 2022-02-27 NOTE — Telephone Encounter (Signed)
Spoke with patient's wife. She reports elevated BP for the last few days.  159/78, 153/79 175/86, 154/69   Patient takes half of losartan-hctz 50/12.'5mg'$  in the AM and metoprolol succinate in the PM  Patient's PCP told him to take HALF of losartan-hctz back in October but this is not in PCP's note nor updated on med list. He has been doing this since this visit.  Wife would like to know if OK to increase me to whole tablet, as our office prescribed, and is on med list.. but differently than patient has been taking.   Routed to MD/PharmD HTN clinic to review request for dose increase.

## 2022-02-27 NOTE — Telephone Encounter (Signed)
Pt advised to stop losartan-HCTZ on 10/01/21 appt with Coletta Memos - BMET showed K of 5.3, also had increase in SCr.   Follow up BMET 10/31/21 stable, K WNL and SCr improved off of med.  Pt called in 11/12/21 reporting elevated BP reading and was advised by MD he could restart his losartan-HCTZ. Sounds like pt reduced to 1/2 tab in October, I don't see records of this either.  He has not had repeat BMET checked after restarting his losartan-HCTZ. He would need this rechecked before increasing his dose back to full tab based on prior hyperkalemia and rise in SCr on full tab.  Has he brought his BP cuff to the office before to confirm accuracy and review technique? Reports home BP are high, but all recent office visits show BP at goal with some on the lower end.  He should limit salt, caffeine, and NSAID use as well to help with his BP. Amlodipine could be an option to try as well since his labs don't affect use of the medication.

## 2022-02-27 NOTE — Telephone Encounter (Signed)
Pt c/o medication issue:  1. Name of Medication: Losartan half of what he was taking  2. How are you currently taking this medication (dosage and times per day)?   3. Are you having a reaction (difficulty breathing--STAT)?  no  4. What is your medication issue? Blood pressure is going back up high   159/78, 153/79 175/86, 154/69- she wants to know if he needs  to increase his Losartan

## 2022-02-27 NOTE — Telephone Encounter (Signed)
Spoke with patient's wife. Reviewed with her info provided by [facility]. She was notified of need for BMET before med changes. Educated on when to take BP and how. Reviewed dietary modifications and what can impact BP, along with NSAIDs but she said he does not take these. Patient will go today for lab work, will check BP at home. Routed to primary nurse as Juluis Rainier

## 2022-03-03 ENCOUNTER — Encounter: Payer: Self-pay | Admitting: *Deleted

## 2022-03-03 NOTE — Telephone Encounter (Signed)
Spoke with pt wife, aware to continue the current medications. He is currently taking 1/2 of the losartan hct 50/12.5 mg once daily. They will check his blood pressure twice daily until the end of the week and then call me back with those numbers.

## 2022-03-06 ENCOUNTER — Telehealth: Payer: Self-pay | Admitting: Cardiology

## 2022-03-06 DIAGNOSIS — I10 Essential (primary) hypertension: Secondary | ICD-10-CM

## 2022-03-06 MED ORDER — LOSARTAN POTASSIUM-HCTZ 100-12.5 MG PO TABS: 1.0000 | ORAL_TABLET | Freq: Every day | ORAL | 3 refills | Status: DC

## 2022-03-06 NOTE — Telephone Encounter (Signed)
Spoke with pt wife, she called to report how his blood pressure is running.   1/2-148/83/69 Pm-160/80/77 1/3-145/72/61 Pm-133/66/79 1/4-148/77/70 Pm-152/68/81 1/5-147/81/69  She also reports his blood sugar has spoked to over 200. She is going to call his medical doctor.  Will forward for dr Stanford Breed review

## 2022-03-06 NOTE — Telephone Encounter (Signed)
Pt spouse states she was told to c/b with bp readings for Hilda Blades, Therapist, sports.

## 2022-03-06 NOTE — Telephone Encounter (Signed)
Spoke with pt wife, aware to increase losartan/hct 100-12.5 mg once daily. New script sent to the pharmacy and Lab orders mailed to the pt

## 2022-03-09 ENCOUNTER — Ambulatory Visit: Payer: Medicare Other | Attending: Pulmonary Disease | Admitting: Pulmonary Disease

## 2022-03-09 DIAGNOSIS — G4733 Obstructive sleep apnea (adult) (pediatric): Secondary | ICD-10-CM | POA: Insufficient documentation

## 2022-03-09 DIAGNOSIS — G4761 Periodic limb movement disorder: Secondary | ICD-10-CM | POA: Diagnosis not present

## 2022-03-10 ENCOUNTER — Telehealth: Payer: Self-pay | Admitting: Pulmonary Disease

## 2022-03-10 DIAGNOSIS — G4733 Obstructive sleep apnea (adult) (pediatric): Secondary | ICD-10-CM

## 2022-03-10 NOTE — Telephone Encounter (Signed)
CPAP 03/09/22 >> suboptimal; did best betwee 6 to 10 cm H2O; centrals with higher pressures; increase PLMI   Please let him know he did best during sleep study with CPAP between 6 to 10 cm water pressure.  I have an order to [facility] to arrange for a new auto CPAP machine.  He will need an ROV in 3 to 4 months.

## 2022-03-10 NOTE — Telephone Encounter (Signed)
Called and gave results to wife and she voiced understanding. She asked that we send order to [facility] instead of [facility]. Order changed. Nothing further needed at this time.

## 2022-03-10 NOTE — Procedures (Signed)
Patient Name: Andrew Malone, Andrew Malone Date: 03/09/2022 Gender: Male D.O.B: [birth year] Age (years): 84 Referring Provider: Chesley Mires MD, ABSM Height (inches): 71 Interpreting Physician: Chesley Mires MD, ABSM Weight (lbs): 191 RPSGT: Rosebud Poles BMI: 27 MRN: [id number] Neck Size: 16.00  CLINICAL INFORMATION The patient is referred for a CPAP titration to treat sleep apnea.  Date of NPSG 09/14/10: AHI 8, SpO2 low 84%.  SLEEP STUDY TECHNIQUE As per the AASM Manual for the Scoring of Sleep and Associated Events v2.3 (April 2016) with a hypopnea requiring 4% desaturations.  The channels recorded and monitored were frontal, central and occipital EEG, electrooculogram (EOG), submentalis EMG (chin), nasal and oral airflow, thoracic and abdominal wall motion, anterior tibialis EMG, snore microphone, electrocardiogram, and pulse oximetry. Continuous positive airway pressure (CPAP) was initiated at the beginning of the study and titrated to treat sleep-disordered breathing.  MEDICATIONS Medications self-administered by patient taken the night of the study : N/A  TECHNICIAN COMMENTS Comments added by technician: CPAP therapy started at 4 CWP and increased to 12 CWP due to events in later part of study. Patient tolearted CPAP very well. Patient pleased with the interface that was used during the study, ResMed Airfit F30, size small. EPR = 3. PLMS noticed at times, with and w/o arousals. Please note ECG throughout study, frequent PVC's noted at times. Example: Epoch 804. Optimal pressure of 12 CWP obtained Comments added by scorer: N/A  RESPIRATORY PARAMETERS Optimal PAP Pressure (cm): 7 AHI at Optimal Pressure (/hr): 3.1 Overall Minimal O2 (%): 82.00 Supine % at Optimal Pressure (%): 2 Minimal O2 at Optimal Pressure (%): 86.0    He developed central apneas at higher pressures, particulary when in the supine position.  SLEEP ARCHITECTURE The study was initiated at 10:15:48 PM and  ended at 5:43:47 AM.  Sleep onset time was 24.3 minutes and the sleep efficiency was 70.3%. The total sleep time was 315 minutes.  The patient spent 3.17% of the night in stage N1 sleep, 80.16% in stage N2 sleep, 9.84% in stage N3 and 6.8% in REM.Stage REM latency was 314.0 minutes  Wake after sleep onset was 108.7. Alpha intrusion was absent. Supine sleep was 51.81%.  CARDIAC DATA The 2 lead EKG demonstrated sinus rhythm. The mean heart rate was 67.40 beats per minute. Other EKG findings include: PVCs.  LEG MOVEMENT DATA The total Periodic Limb Movements of Sleep (PLMS) were 328. The PLMS index was 62.48. A PLMS index of <15 is considered normal in adults.  IMPRESSIONS - Difficult titration study.  He seemed to do best with CPAP between 7 and 10 cm H2O depending on sleep position.  He developed central events at higher pressure settings. - Moderate oxygen desaturations were observed during this titration (min O2 = 82.00%). - The patient snored with moderate snoring volume during this titration study. - 2-lead EKG demonstrated: PVCs - Severe periodic limb movements were observed during this study. Arousals associated with PLMs were significant.  DIAGNOSIS - Obstructive Sleep Apnea (G47.33) - Periodic Limb Movement During Sleep (G47.61)  RECOMMENDATIONS - He should be tried on auto CPAP 6 to 10 cm H2O. - He was fitted with a Small size Resmed Full Face AirFit F30 mask and heated humidification. - Assess for the presence of restless leg syndrome. - Avoid alcohol, sedatives and other CNS depressants that may worsen sleep apnea and disrupt normal sleep architecture. - Sleep hygiene should be reviewed to assess factors that may improve sleep quality. - Weight management and regular  exercise should be initiated or continued.  [Electronically signed] 03/10/2022 10:43 AM  Chesley Mires MD, ABSM Diplomate, American Board of Sleep Medicine NPI: [id number]  [place] PH: [phone]   FX: [phone] [place]

## 2022-03-13 ENCOUNTER — Other Ambulatory Visit (HOSPITAL_COMMUNITY)
Admission: RE | Admit: 2022-03-13 | Discharge: 2022-03-13 | Disposition: A | Discharge status: Home / Self Care | Payer: Medicare Other | Source: Ambulatory Visit | Attending: Cardiology | Admitting: Cardiology

## 2022-03-13 ENCOUNTER — Telehealth: Payer: Self-pay | Admitting: *Deleted

## 2022-03-13 DIAGNOSIS — I10 Essential (primary) hypertension: Secondary | ICD-10-CM | POA: Insufficient documentation

## 2022-03-13 LAB — BASIC METABOLIC PANEL
Anion gap: 10 (ref 5–15)
BUN: 31 mg/dL — ABNORMAL HIGH (ref 8–23)
CO2: 24 mmol/L (ref 22–32)
Calcium: 9.4 mg/dL (ref 8.9–10.3)
Chloride: 104 mmol/L (ref 98–111)
Creatinine, Ser: 1.45 mg/dL — ABNORMAL HIGH (ref 0.61–1.24)
GFR, Estimated: 49 mL/min — ABNORMAL LOW (ref >60)
Glucose, Bld: 151 mg/dL — ABNORMAL HIGH (ref 70–99)
Potassium: 4.7 mmol/L (ref 3.5–5.1)
Sodium: 138 mmol/L (ref 135–145)

## 2022-03-13 MED ORDER — LOSARTAN POTASSIUM 100 MG PO TABS: 100.0000 mg | ORAL_TABLET | Freq: Every day | ORAL | 3 refills | Status: DC

## 2022-03-13 NOTE — Telephone Encounter (Signed)
Pt spouse returning call

## 2022-03-13 NOTE — Telephone Encounter (Signed)
Left message for pt to call

## 2022-03-13 NOTE — Telephone Encounter (Signed)
Spoke with pt wife, Aware of dr Jacalyn Lefevre recommendations.  New script sent to the pharmacy

## 2022-03-13 NOTE — Telephone Encounter (Signed)
-----  Message from Lelon Perla, MD sent at 03/13/2022  9:37 AM EST ----- DC HCT and continue losartan Kirk Ruths

## 2022-03-13 NOTE — Telephone Encounter (Signed)
Returned cal. Pt's wife states "The message said to call Andrew Malone." Told pt I could assist her but she can speak to Andrew Malone if she wants to. She states "I would prefer to speak to her."

## 2022-03-18 ENCOUNTER — Encounter: Payer: Self-pay | Admitting: Internal Medicine

## 2022-03-18 ENCOUNTER — Ambulatory Visit (INDEPENDENT_AMBULATORY_CARE_PROVIDER_SITE_OTHER): Payer: Medicare Other | Admitting: Internal Medicine

## 2022-03-18 VITALS — BP 110/60 | Ht 70.0 in | Wt 187.2 lb

## 2022-03-18 DIAGNOSIS — I2510 Atherosclerotic heart disease of native coronary artery without angina pectoris: Secondary | ICD-10-CM | POA: Diagnosis not present

## 2022-03-18 DIAGNOSIS — N183 Chronic kidney disease, stage 3 unspecified: Secondary | ICD-10-CM

## 2022-03-18 DIAGNOSIS — E1142 Type 2 diabetes mellitus with diabetic polyneuropathy: Secondary | ICD-10-CM | POA: Diagnosis not present

## 2022-03-18 DIAGNOSIS — E538 Deficiency of other specified B group vitamins: Secondary | ICD-10-CM | POA: Diagnosis not present

## 2022-03-18 DIAGNOSIS — I2583 Coronary atherosclerosis due to lipid rich plaque: Secondary | ICD-10-CM

## 2022-03-18 DIAGNOSIS — I679 Cerebrovascular disease, unspecified: Secondary | ICD-10-CM

## 2022-03-18 DIAGNOSIS — I10 Essential (primary) hypertension: Secondary | ICD-10-CM | POA: Diagnosis not present

## 2022-03-18 NOTE — Assessment & Plan Note (Signed)
On Losartan, off HCTZ Plavix, Pitavastatin

## 2022-03-18 NOTE — Assessment & Plan Note (Signed)
Hydrate well Off HCTZ

## 2022-03-18 NOTE — Assessment & Plan Note (Signed)
Monitor A1c 

## 2022-03-18 NOTE — Assessment & Plan Note (Signed)
On Livalo - much better

## 2022-03-18 NOTE — Assessment & Plan Note (Signed)
Chronic  On Losartan, off HCTZ

## 2022-03-18 NOTE — Assessment & Plan Note (Signed)
On B12 

## 2022-03-18 NOTE — Progress Notes (Signed)
Subjective:  [name]: Andrew Malone., male    DOB: 10/03/1940  Age: 82 y.o. MRN: [id number]  CC: Follow-up (3 month f/u)   HPI [name] presents for HTN, CAD, dyslipidemia  Outpatient Medications Prior to Visit  Medication Sig Dispense Refill   acyclovir (ZOVIRAX) 200 MG capsule TAKE 1 CAPSULE(200 MG) BY MOUTH TWICE DAILY 180 capsule 3   ammonium lactate (LAC-HYDRIN) 12 % lotion APPLY TO THE TO THE AFFECTED AREA DAILY 400 g 0   aspirin EC 81 MG tablet Take 1 tablet (81 mg total) by mouth daily. Swallow whole. 30 tablet 12   Cholecalciferol 1000 units tablet Take 1,000 Units by mouth daily.     citalopram (CELEXA) 10 MG tablet TAKE 1 TABLET(10 MG) BY MOUTH DAILY (Patient taking differently: Take 10 mg by mouth daily. TAKE 1 TABLET(10 MG) BY MOUTH DAILY) 90 tablet 3   clopidogrel (PLAVIX) 75 MG tablet Take 1 tablet (75 mg total) by mouth daily. 90 tablet 3   Cyanocobalamin (VITAMIN B-12) 1000 MCG SUBL Place 1 tablet (1,000 mcg total) under the tongue daily. 100 tablet 3   glucose blood (ONETOUCH VERIO) test strip check blood sugar In Vitro once a day DX: E11.42 for 90 days     ipratropium (ATROVENT) 0.03 % nasal spray Place 2 sprays into both nostrils 3 (three) times daily as needed for rhinitis. 30 mL 12   Loperamide HCl (IMODIUM A-D PO) Take by mouth as needed.     losartan (COZAAR) 100 MG tablet Take 1 tablet (100 mg total) by mouth daily. 90 tablet 3   metFORMIN (GLUCOPHAGE-XR) 500 MG 24 hr tablet Take 1,000 mg by mouth daily with supper.   3   metoprolol succinate (TOPROL-XL) 25 MG 24 hr tablet TAKE 1/2 TABLET BY MOUTH AT BEDTIME 90 tablet 2   nitroGLYCERIN (NITROSTAT) 0.4 MG SL tablet Place 1 tablet (0.4 mg total) under the tongue every 5 (five) minutes x 3 doses as needed for chest pain. 25 tablet 12   pantoprazole (PROTONIX) 40 MG tablet TAKE 1 TABLET BY MOUTH EVERY DAY 90 tablet 3   tamsulosin (FLOMAX) 0.4 MG CAPS capsule TAKE 1 CAPSULE BY MOUTH EVERY NIGHT AT  BEDTIME 90 capsule 1   ZYPITAMAG 4 MG TABS 1 PO QD 90 tablet 3   No facility-administered medications prior to visit.    ROS: Review of Systems  Constitutional:  Positive for fatigue. Negative for appetite change and unexpected weight change.  HENT:  Negative for congestion, nosebleeds, sneezing, sore throat and trouble swallowing.   Eyes:  Negative for itching and visual disturbance.  Respiratory:  Negative for cough.   Cardiovascular:  Negative for chest pain, palpitations and leg swelling.  Gastrointestinal:  Negative for abdominal distention, blood in stool, diarrhea and nausea.  Genitourinary:  Negative for frequency and hematuria.  Musculoskeletal:  Positive for arthralgias and back pain. Negative for gait problem, joint swelling and neck pain.  Skin:  Negative for rash.  Neurological:  Negative for dizziness, tremors, speech difficulty and weakness.  Psychiatric/Behavioral:  Negative for agitation, dysphoric mood and sleep disturbance. The patient is not nervous/anxious.     Objective:  BP 110/60   Ht '5\' 10"'$  (1.778 m)   Wt 187 lb 3.2 oz (84.9 kg)   BMI 26.86 kg/m   BP Readings from Last 3 Encounters:  03/18/22 110/60  02/11/22 (!) 151/79  01/27/22 128/78    Wt Readings from Last 3 Encounters:  03/18/22 187 lb  3.2 oz (84.9 kg)  02/11/22 191 lb 12.8 oz (87 kg)  01/27/22 191 lb 12.8 oz (87 kg)    Physical Exam Constitutional:      General: He is not in acute distress.    Appearance: He is well-developed.     Comments: NAD  Eyes:     Conjunctiva/sclera: Conjunctivae normal.     Pupils: Pupils are equal, round, and reactive to light.  Neck:     Thyroid: No thyromegaly.     Vascular: No JVD.  Cardiovascular:     Rate and Rhythm: Normal rate and regular rhythm.     Heart sounds: Normal heart sounds. No murmur heard.    No friction rub. No gallop.  Pulmonary:     Effort: Pulmonary effort is normal. No respiratory distress.     Breath sounds: Normal breath  sounds. No wheezing or rales.  Chest:     Chest wall: No tenderness.  Abdominal:     General: Bowel sounds are normal. There is no distension.     Palpations: Abdomen is soft. There is no mass.     Tenderness: There is no abdominal tenderness. There is no guarding or rebound.  Musculoskeletal:        General: No tenderness. Normal range of motion.     Cervical back: Normal range of motion.  Lymphadenopathy:     Cervical: No cervical adenopathy.  Skin:    General: Skin is warm and dry.     Findings: No rash.  Neurological:     Mental Status: He is alert and oriented to person, place, and time.     Cranial Nerves: No cranial nerve deficit.     Motor: No abnormal muscle tone.     Coordination: Coordination normal.     Gait: Gait normal.     Deep Tendon Reflexes: Reflexes are normal and symmetric.  Psychiatric:        Behavior: Behavior normal.        Thought Content: Thought content normal.        Judgment: Judgment normal.     Lab Results  Component Value Date   WBC 5.4 10/17/2021   HGB 11.5 (L) 10/17/2021   HCT 34.2 (L) 10/17/2021   PLT 202 10/17/2021   GLUCOSE 151 (H) 03/13/2022   CHOL 210 (H) 11/04/2021   TRIG 149 11/04/2021   HDL 31 (L) 11/04/2021   LDLDIRECT 51.0 07/24/2016   LDLCALC 149 (H) 11/04/2021   ALT 23 11/04/2021   AST 19 11/04/2021   NA 138 03/13/2022   K 4.7 03/13/2022   CL 104 03/13/2022   CREATININE 1.45 (H) 03/13/2022   BUN 31 (H) 03/13/2022   CO2 24 03/13/2022   TSH 0.882 10/17/2021   PSA 1.75 07/24/2016   INR 1.0 07/13/2018   HGBA1C 7.0 (H) 10/31/2021   MICROALBUR <0.7 10/31/2020    No results found.  Assessment & Plan:   Problem List Items Addressed This Visit       Cardiovascular and Mediastinum   Essential hypertension    Chronic  On Losartan, off HCTZ      Coronary atherosclerosis - Primary    On Livalo - much better      Cerebrovascular disease    On Losartan, off HCTZ Plavix, Pitavastatin        Endocrine    Polyneuropathy due to type 2 diabetes mellitus (HCC)    Monitor A1c        Genitourinary   CRI (chronic renal insufficiency),  stage 3 (moderate) (HCC)    Hydrate well Off HCTZ        Other   B12 deficiency    On B12         No orders of the defined types were placed in this encounter.     Follow-up: Return in about 3 months (around 06/17/2022) for a follow-up visit.  Walker Kehr, MD

## 2022-03-27 ENCOUNTER — Telehealth: Payer: Self-pay | Admitting: Pulmonary Disease

## 2022-03-27 NOTE — Telephone Encounter (Signed)
left voice message for Andrew Malone stating we only have sleep study for 2024. Faxed current sleep study to [phone]

## 2022-03-27 NOTE — Telephone Encounter (Signed)
[facility] [employer]. Do we have on file an old sleep study for this pt around 2019. Pls call to advise. Fax if we have it  Mariann Laster [phone] Direct  [phone] is Fax

## 2022-03-30 ENCOUNTER — Ambulatory Visit (HOSPITAL_COMMUNITY): Payer: Medicare Other | Attending: Cardiology

## 2022-03-30 DIAGNOSIS — I350 Nonrheumatic aortic (valve) stenosis: Secondary | ICD-10-CM

## 2022-03-30 LAB — ECHOCARDIOGRAM COMPLETE
AR max vel: 1.69 cm2
AV Area VTI: 1.72 cm2
AV Area mean vel: 1.74 cm2
AV Mean grad: 20.5 mmHg
AV Peak grad: 38.1 mmHg
Ao pk vel: 3.09 m/s
Area-P 1/2: 3.31 cm2
S' Lateral: 2.2 cm

## 2022-03-31 ENCOUNTER — Other Ambulatory Visit: Payer: Self-pay | Admitting: *Deleted

## 2022-03-31 DIAGNOSIS — I350 Nonrheumatic aortic (valve) stenosis: Secondary | ICD-10-CM

## 2022-04-13 ENCOUNTER — Telehealth: Payer: Self-pay | Admitting: Cardiology

## 2022-04-13 MED ORDER — AMLODIPINE BESYLATE 5 MG PO TABS: 5.0000 mg | ORAL_TABLET | Freq: Every day | ORAL | 3 refills | Status: DC

## 2022-04-13 NOTE — Telephone Encounter (Signed)
Wife would like a call back directly from [facility].

## 2022-04-13 NOTE — Telephone Encounter (Signed)
Upon introducing myself, wife stated, "I'm supposed to get a call back from [place]."

## 2022-04-13 NOTE — Telephone Encounter (Signed)
Spoke with pt wife, he is taking the losartan without HCTZ. She reports he is having severe headaches since 03/31/22. It is located at the top of his head and is not responding to tylenol. Aware of dr Jacalyn Lefevre recommendations. She also reports his heart rate has been 68-83 bpm which is faster than his usual.

## 2022-04-13 NOTE — Telephone Encounter (Signed)
Spoke with pt, Aware of dr crenshaw's recommendations. New script sent to the pharmacy  

## 2022-04-13 NOTE — Telephone Encounter (Signed)
Pt c/o BP issue: STAT if pt c/o blurred vision, one-sided weakness or slurred speech  1. What are your last 5 BP readings?  149/80 89 HR 160/85  174/80 174/18 175/19 189/91 2. Are you having any other symptoms (ex. Dizziness, headache, blurred vision, passed out)? Headache   3. What is your BP issue? Pt's wife is calling with concerns on patient's bp. She says pt has been put on new medication last month, but he BP still seems a bit high. Requesting return call.

## 2022-04-15 ENCOUNTER — Telehealth: Payer: Self-pay | Admitting: Internal Medicine

## 2022-04-15 NOTE — Telephone Encounter (Signed)
Pt wife called wanted a medication call in for congestion but they don't want it to conflict with his high blood pressure.   Pt wife would like a urgent call back with update

## 2022-04-16 MED ORDER — PROMETHAZINE-DM 6.25-15 MG/5ML PO SYRP: 5.0000 mL | ORAL_SOLUTION | Freq: Four times a day (QID) | ORAL | 0 refills | Status: DC | PRN

## 2022-04-16 MED ORDER — CEFDINIR 300 MG PO CAPS: 300.0000 mg | ORAL_CAPSULE | Freq: Two times a day (BID) | ORAL | 0 refills | Status: DC

## 2022-04-16 NOTE — Telephone Encounter (Signed)
[facility] emailed Take Metoprolol 25 mg 1/2 tab bid if HR>80 Thx

## 2022-04-16 NOTE — Telephone Encounter (Signed)
Okay promethazine cough syrup for congestion/cough.

## 2022-04-16 NOTE — Telephone Encounter (Signed)
Called pt spoke w/ wife she states she did not ask for cough syrup she states husband HR been elevated running in the 99. Dr. Camila Li rx metoprolol 25 mg 1/2 at bedtime which he is doing, but still concern about high HR doing the day. She states he does have a cough spitting up yellowish phlem. She states would like antibiotic or something. Been taking Robitussin.Marland KitchenJohny Chess

## 2022-04-17 NOTE — Telephone Encounter (Signed)
Spoke with the pts wife and was able to inform her of Dr. Judeen Hammans instructions. She states she understands and has no questions or concerns.

## 2022-04-20 ENCOUNTER — Telehealth: Payer: Self-pay | Admitting: Cardiology

## 2022-04-20 NOTE — Telephone Encounter (Signed)
Spoke with pt's wife, Hassan Rowan (ok per [facility]) regarding his metoprolol succinate. Wife would like to discuss medication changes with Hilda Blades and Dr. Stanford Breed. Will send message over to them.

## 2022-04-20 NOTE — Telephone Encounter (Signed)
Pt c/o medication issue:  1. Name of Medication:  metoprolol succinate (TOPROL-XL) 25 MG 24 hr tablet   2. How are you currently taking this medication (dosage and times per day)? 1/2 a tablet daily with an additional 1/2 a tablet if HR is over 80   3. Are you having a reaction (difficulty breathing--STAT)? No   4. What is your medication issue? Wife is calling wanting to speak with Neoma Laming in regards to patient increasing this medication to an additional 1/2 a tablet if his HR is over 80.

## 2022-04-20 NOTE — Telephone Encounter (Addendum)
Spoke with pt wife, she reports his heart rate is running high. Dr plotnikov told the patient he could take an extra 1/2 metoprolol if his heart rate is higher than 80 bpm. She wants to make sure that is okay. She reports his heart rate never gets below 82 bpm. She reports his pulse is irregular and sounds like it is fluttering. They will come to the office tomorrow to see the NP.

## 2022-04-21 ENCOUNTER — Encounter: Payer: Self-pay | Admitting: Nurse Practitioner

## 2022-04-21 ENCOUNTER — Ambulatory Visit: Payer: Medicare Other | Attending: Nurse Practitioner | Admitting: Nurse Practitioner

## 2022-04-21 VITALS — BP 118/58 | HR 97 | Ht 70.5 in | Wt 185.0 lb

## 2022-04-21 DIAGNOSIS — R000 Tachycardia, unspecified: Secondary | ICD-10-CM | POA: Diagnosis not present

## 2022-04-21 DIAGNOSIS — I6523 Occlusion and stenosis of bilateral carotid arteries: Secondary | ICD-10-CM | POA: Insufficient documentation

## 2022-04-21 DIAGNOSIS — G4733 Obstructive sleep apnea (adult) (pediatric): Secondary | ICD-10-CM | POA: Diagnosis not present

## 2022-04-21 DIAGNOSIS — I2510 Atherosclerotic heart disease of native coronary artery without angina pectoris: Secondary | ICD-10-CM | POA: Diagnosis not present

## 2022-04-21 DIAGNOSIS — Z8673 Personal history of transient ischemic attack (TIA), and cerebral infarction without residual deficits: Secondary | ICD-10-CM | POA: Insufficient documentation

## 2022-04-21 DIAGNOSIS — R42 Dizziness and giddiness: Secondary | ICD-10-CM | POA: Diagnosis not present

## 2022-04-21 DIAGNOSIS — R0602 Shortness of breath: Secondary | ICD-10-CM

## 2022-04-21 DIAGNOSIS — I10 Essential (primary) hypertension: Secondary | ICD-10-CM | POA: Diagnosis not present

## 2022-04-21 DIAGNOSIS — E785 Hyperlipidemia, unspecified: Secondary | ICD-10-CM | POA: Diagnosis not present

## 2022-04-21 DIAGNOSIS — R002 Palpitations: Secondary | ICD-10-CM | POA: Diagnosis not present

## 2022-04-21 LAB — CBC

## 2022-04-21 MED ORDER — METOPROLOL SUCCINATE ER 25 MG PO TB24: 25.0000 mg | ORAL_TABLET | Freq: Every day | ORAL | 3 refills | Status: DC

## 2022-04-21 NOTE — Patient Instructions (Signed)
Medication Instructions:  Increase Metoprolol 25 mg daily.  *If you need a refill on your cardiac medications before your next appointment, please call your pharmacy*   Lab Work: Your physician recommends that you complete lab work today. CBC, CMET, TSH, Magnesium, Lipid panel  If you have labs (blood work) drawn today and your tests are completely normal, you will receive your results only by: MyChart Message (if you have MyChart) OR A paper copy in the mail If you have any lab test that is abnormal or we need to change your treatment, we will call you to review the results.   Testing/Procedures: NONE ordered at this time of appointment     Follow-Up: At [facility], you and your health needs are our priority.  As part of our continuing mission to provide you with exceptional heart care, we have created designated Provider Care Teams.  These Care Teams include your primary Cardiologist (physician) and Advanced Practice Providers (APPs -  Physician Assistants and Nurse Practitioners) who all work together to provide you with the care you need, when you need it.  We recommend signing up for the patient portal called "MyChart".  Sign up information is provided on this After Visit Summary.  MyChart is used to connect with patients for Virtual Visits (Telemedicine).  Patients are able to view lab/test results, encounter notes, upcoming appointments, etc.  Non-urgent messages can be sent to your provider as well.   To learn more about what you can do with MyChart, go to [email or web address].    Your next appointment:   1 month(s)  Provider:   Kirk Ruths, MD     Other Instructions
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
## 2022-04-22 ENCOUNTER — Encounter: Payer: Self-pay | Admitting: Nurse Practitioner

## 2022-04-22 LAB — CBC
Hematocrit: 37.9 % (ref 37.5–51.0)
Hemoglobin: 12.2 g/dL — ABNORMAL LOW (ref 13.0–17.7)
MCH: 28.7 pg (ref 26.6–33.0)
MCHC: 32.2 g/dL (ref 31.5–35.7)
MCV: 89 fL (ref 79–97)
Platelets: 359 10*3/uL (ref 150–450)
RBC: 4.25 x10E6/uL (ref 4.14–5.80)
RDW: 13 % (ref 11.6–15.4)
WBC: 11.5 10*3/uL — ABNORMAL HIGH (ref 3.4–10.8)

## 2022-04-22 LAB — LIPID PANEL
Chol/HDL Ratio: 4.8 ratio (ref 0.0–5.0)
Cholesterol, Total: 143 mg/dL (ref 100–199)
HDL: 30 mg/dL — ABNORMAL LOW (ref >39)
LDL Chol Calc (NIH): 84 mg/dL (ref 0–99)
Triglycerides: 165 mg/dL — ABNORMAL HIGH (ref 0–149)
VLDL Cholesterol Cal: 29 mg/dL (ref 5–40)

## 2022-04-22 LAB — COMPREHENSIVE METABOLIC PANEL
ALT: 25 IU/L (ref 0–44)
AST: 19 IU/L (ref 0–40)
Albumin/Globulin Ratio: 1.8 (ref 1.2–2.2)
Albumin: 4.4 g/dL (ref 3.8–4.8)
Alkaline Phosphatase: 122 IU/L — ABNORMAL HIGH (ref 44–121)
BUN/Creatinine Ratio: 14 (ref 10–24)
BUN: 18 mg/dL (ref 8–27)
Bilirubin Total: 0.4 mg/dL (ref 0.0–1.2)
CO2: 25 mmol/L (ref 20–29)
Calcium: 10.3 mg/dL — ABNORMAL HIGH (ref 8.6–10.2)
Chloride: 103 mmol/L (ref 96–106)
Creatinine, Ser: 1.33 mg/dL — ABNORMAL HIGH (ref 0.76–1.27)
Globulin, Total: 2.5 g/dL (ref 1.5–4.5)
Glucose: 141 mg/dL — ABNORMAL HIGH (ref 70–99)
Potassium: 4.9 mmol/L (ref 3.5–5.2)
Sodium: 140 mmol/L (ref 134–144)
Total Protein: 6.9 g/dL (ref 6.0–8.5)
eGFR: 54 mL/min/{1.73_m2} — ABNORMAL LOW (ref >59)

## 2022-04-22 LAB — MAGNESIUM: Magnesium: 1.6 mg/dL (ref 1.6–2.3)

## 2022-04-22 LAB — TSH: TSH: 1.55 u[IU]/mL (ref 0.450–4.500)

## 2022-04-28 ENCOUNTER — Ambulatory Visit (HOSPITAL_COMMUNITY)
Admission: RE | Admit: 2022-04-28 | Discharge: 2022-04-28 | Disposition: A | Discharge status: Home / Self Care | Payer: Medicare Other | Source: Ambulatory Visit | Attending: Internal Medicine | Admitting: Internal Medicine

## 2022-04-28 ENCOUNTER — Encounter: Payer: Self-pay | Admitting: Internal Medicine

## 2022-04-28 ENCOUNTER — Ambulatory Visit (INDEPENDENT_AMBULATORY_CARE_PROVIDER_SITE_OTHER): Payer: Medicare Other | Admitting: Internal Medicine

## 2022-04-28 VITALS — BP 132/62 | HR 92 | Ht 70.5 in | Wt 183.8 lb

## 2022-04-28 DIAGNOSIS — R0609 Other forms of dyspnea: Secondary | ICD-10-CM

## 2022-04-28 DIAGNOSIS — I6523 Occlusion and stenosis of bilateral carotid arteries: Secondary | ICD-10-CM | POA: Diagnosis not present

## 2022-04-28 DIAGNOSIS — R0600 Dyspnea, unspecified: Secondary | ICD-10-CM | POA: Diagnosis not present

## 2022-04-28 MED ORDER — DOXYCYCLINE HYCLATE 100 MG PO TABS: 100.0000 mg | ORAL_TABLET | Freq: Two times a day (BID) | ORAL | 0 refills | Status: DC

## 2022-04-28 NOTE — Patient Instructions (Addendum)
For cough > Mucinex dm 1200 mg  take one twice daily   Doxycycline 100 mg twice daily with glass of water before eating   Continue Pantoprazole (protonix) 40 mg   Take  30-60 min before first meal of the day and  at Pepcid (famotidine)  20 mg after supper until return to office - this is the best way to tell whether stomach acid is contributing to your problem.    Please remember to go to the  x-ray department  @  [facility] for your tests - we will call you with the results when they are available     Call by 05/01/22 if not better

## 2022-04-28 NOTE — Assessment & Plan Note (Signed)
New onset x fall 2023 with assoc sensation of tachycardia (highest recorded = 103 HR) - Echo 03/30/22   1. LV EF 60 to 65%. There is severe LVH   2. Right ventricular systolic function is normal. The right ventricular size is normal.   3. LA size was moderately dilated.   4.  Mild mitral valve regurgitation.    5. Gradients similar to TTE done 09/20/21   = Moderate aortic valve stenosis.   No evidence of worsening chf clinically or radiographically with self reported walking sats ok as well but does have an purulent tracheobronchitis by hx that has not responded to approp abx so  rec  1) try doxy x 10 days to cover atypicals 2) max rx for gerd to prevent cyclical coughing 3) mucinex dm 1200 mg bid  4) f/u in 72 h if net better.           Each maintenance medication was reviewed in detail including emphasizing most importantly the difference between maintenance and prns and under what circumstances the prns are to be triggered using an action plan format where appropriate.  Total time for H and P, chart review, counseling, and generating customized AVS unique to this Acute  office visit / same day charting > 40 min with pt new to me
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
## 2022-04-29 ENCOUNTER — Ambulatory Visit: Payer: Medicare Other | Admitting: Student

## 2022-05-01 ENCOUNTER — Telehealth: Payer: Self-pay

## 2022-05-01 NOTE — Telephone Encounter (Signed)
Spoke with pt and pts spouse. Pt was notified of lab results and recommendations. Pt will discuss starting Zetia with Dr. Stanford Breed at his appointment on  05/21/22. Ok per Diona Browner NP.

## 2022-05-04 ENCOUNTER — Other Ambulatory Visit: Payer: Self-pay | Admitting: Internal Medicine
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
## 2022-05-19 ENCOUNTER — Ambulatory Visit (INDEPENDENT_AMBULATORY_CARE_PROVIDER_SITE_OTHER): Payer: Medicare Other | Admitting: Pulmonary Disease

## 2022-05-19 ENCOUNTER — Encounter: Payer: Self-pay | Admitting: Pulmonary Disease

## 2022-05-19 VITALS — BP 132/64 | HR 64 | Ht 70.5 in | Wt 183.4 lb

## 2022-05-19 DIAGNOSIS — G4733 Obstructive sleep apnea (adult) (pediatric): Secondary | ICD-10-CM | POA: Diagnosis not present

## 2022-05-19 DIAGNOSIS — J310 Chronic rhinitis: Secondary | ICD-10-CM | POA: Diagnosis not present

## 2022-05-19 DIAGNOSIS — R052 Subacute cough: Secondary | ICD-10-CM | POA: Diagnosis not present

## 2022-05-19 MED ORDER — AZITHROMYCIN 500 MG PO TABS: 500.0000 mg | ORAL_TABLET | Freq: Every day | ORAL | 0 refills | Status: DC

## 2022-05-19 NOTE — Progress Notes (Signed)
[facility]  Chief Complaint  Patient presents with   Acute Visit    Acute increased SOB cough gray green sputum worse in am     Constitutional:  BP 132/64   Pulse 64   Ht 5' 10.5" (1.791 m)   Wt 183 lb 6.4 oz (83.2 kg)   SpO2 96% Comment: ra  BMI 25.94 kg/m   Past Medical History:  CAD, Carotid artery disease, CVA, HLD, HTN, GERD, GN, HH, PUD, chronic back pain, Anxiety, Aortic stenosis, DM  Past Surgical History:  He  has a past surgical history that includes Cardiac catheterization (2007); Coronary angioplasty with stent (07/2011); Inguinal hernia repair (~ 2002); Hernia repair (~ 2002); TEE without cardioversion (08/11/2011); Upper gastrointestinal endoscopy; Colonoscopy; and LEFT HEART CATH AND CORONARY ANGIOGRAPHY (N/A, 09/22/2021).  Brief Summary:  Andrew Malone. is a 82 y.o. male with obstructive sleep apnea and short sleep cycle.      Subjective:   He saw Dr. Melvyn Novas last month for cough and dyspnea.  CXR showed Rt diaphragm eventration and atelectasis.  Was prescribed doxycycline.  He had initial improvement, but then started getting a cough again.  He feels more short of breath.  He is bring up gray chunks of phlegm.  He has felt warm, but when he checks his temperature it is normal.    Physical Exam:   Appearance - well kempt   ENMT - no sinus tenderness, no oral exudate, no LAN, Mallampati 2 airway, no stridor  Respiratory - equal breath sounds bilaterally, no wheezing or rales  CV - s1s2 regular rate and rhythm, 3/6 SM  Ext - no clubbing, no edema  Skin - no rashes  Psych - normal mood and affect     Sleep Tests:  PSG 09/14/10>>AHI 8, SpO2 low 84% CPAP 11/02/20 to 12/01/20 >> used on 22 of 30 nights with average 6 hrs 45 min.  Average AHI 1.1 with CPAP 8 cm H2O CPAP 03/09/22 >> suboptimal; did best betwee 6 to 10 cm H2O; centrals with higher pressures; increase PLMI  CPAP 04/18/22 to 05/17/22 >> used on 10 of  30 nights with average 5 hrs 57 min.  Average AHI 2.1 with CPAP 8 cm H2O  Cardiac Tests:  Echo 03/30/22 >> EF 60 to 65%, severe LVH, mod LA dilation, mild MR, mod AS, aortic root 39 mm  Social History:  He  reports that he has never smoked. He quit smokeless tobacco use about 47 years ago.  His smokeless tobacco use included chew. He reports that he does not drink alcohol and does not use drugs.  Family History:  His family history includes Breast cancer in his sister; Coronary artery disease in his mother; Diabetes in his brother, mother, and sister; Emphysema in his brother; Heart attack in his mother; Hyperlipidemia in his mother and sister; Hypertension in his mother and sister; Lung cancer in his father and sister; Stroke in his mother.     Assessment/Plan:   Subacute productive cough with dyspnea. - will give him course of zithromax 500 mg daily for 7 days  Obstructive sleep apnea. - he is compliant with CPAP and reports benefit - he uses [facility] for his DME - current CPAP ordered in January 2024 - continue CPAP 8 cm H2O  Gustatory and CPAP rhinitis. - refill atrovent nasal spray  Aortic stenosis, CAD s/p stent, HTN, HLD. - followed by Dr. Kirk Ruths with [facility]  Time Spent  Involved in Patient Care on Day of Examination:  36 minutes  Follow up:   Patient Instructions  Zithromax 500 mg daily for 7 days  Follow up in 4 weeks  Medication List:   Allergies as of 05/19/2022       Reactions   Empagliflozin Other (See Comments)   Bleeding in the groin   Lipitor [atorvastatin]    Leg weakness, burning - 75% better off Lipitor   Lisinopril    cough   Trulicity [dulaglutide]         Medication List        Accurate as of May 19, 2022 12:56 PM. If you have any questions, ask your nurse or doctor.          STOP taking these medications    doxycycline 100 MG tablet Commonly known as: VIBRA-TABS Stopped by: Chesley Mires, MD       TAKE these  medications    acyclovir 200 MG capsule Commonly known as: ZOVIRAX TAKE 1 CAPSULE(200 MG) BY MOUTH TWICE DAILY   amLODipine 5 MG tablet Commonly known as: NORVASC Take 1 tablet (5 mg total) by mouth daily.   ammonium lactate 12 % lotion Commonly known as: LAC-HYDRIN APPLY TO THE TO THE AFFECTED AREA DAILY   aspirin EC 81 MG tablet Take 1 tablet (81 mg total) by mouth daily. Swallow whole.   azithromycin 500 MG tablet Commonly known as: ZITHROMAX Take 1 tablet (500 mg total) by mouth daily. Started by: Chesley Mires, MD   Cholecalciferol 25 MCG (1000 UT) tablet Take 1,000 Units by mouth daily.   citalopram 10 MG tablet Commonly known as: CELEXA TAKE 1 TABLET(10 MG) BY MOUTH DAILY   clopidogrel 75 MG tablet Commonly known as: PLAVIX Take 1 tablet (75 mg total) by mouth daily.   IMODIUM A-D PO Take by mouth as needed.   ipratropium 0.03 % nasal spray Commonly known as: ATROVENT Place 2 sprays into both nostrils 3 (three) times daily as needed for rhinitis.   losartan 100 MG tablet Commonly known as: COZAAR Take 1 tablet (100 mg total) by mouth daily.   metFORMIN 500 MG 24 hr tablet Commonly known as: GLUCOPHAGE-XR Take 1,000 mg by mouth daily with supper.   metoprolol succinate 25 MG 24 hr tablet Commonly known as: TOPROL-XL Take 1 tablet (25 mg total) by mouth daily.   nitroGLYCERIN 0.4 MG SL tablet Commonly known as: NITROSTAT Place 1 tablet (0.4 mg total) under the tongue every 5 (five) minutes x 3 doses as needed for chest pain.   OneTouch Verio test strip Generic drug: glucose blood check blood sugar In Vitro once a day DX: E11.42 for 90 days   pantoprazole 40 MG tablet Commonly known as: PROTONIX TAKE 1 TABLET BY MOUTH EVERY DAY   promethazine-dextromethorphan 6.25-15 MG/5ML syrup Commonly known as: PROMETHAZINE-DM Take 5 mLs by mouth 4 (four) times daily as needed for cough.   tamsulosin 0.4 MG Caps capsule Commonly known as: FLOMAX TAKE 1  CAPSULE BY MOUTH EVERY NIGHT AT BEDTIME   Vitamin B-12 1000 MCG Subl Place 1 tablet (1,000 mcg total) under the tongue daily.   Zypitamag 4 MG Tabs Generic drug: Pitavastatin Magnesium 1 PO QD        Signature:  Chesley Mires, MD [place] Pager - [phone] 05/19/2022, 12:56 PM

## 2022-05-19 NOTE — Patient Instructions (Signed)
Zithromax 500 mg daily for 7 days  Follow up in 4 weeks

## 2022-05-21 ENCOUNTER — Encounter: Payer: Self-pay | Admitting: Cardiology

## 2022-05-21 ENCOUNTER — Ambulatory Visit: Payer: Medicare Other | Attending: Cardiology | Admitting: Cardiology

## 2022-05-21 VITALS — BP 130/68 | HR 71 | Ht 70.5 in | Wt 181.4 lb

## 2022-05-21 DIAGNOSIS — E785 Hyperlipidemia, unspecified: Secondary | ICD-10-CM

## 2022-05-21 DIAGNOSIS — I10 Essential (primary) hypertension: Secondary | ICD-10-CM | POA: Diagnosis not present

## 2022-05-21 DIAGNOSIS — I2510 Atherosclerotic heart disease of native coronary artery without angina pectoris: Secondary | ICD-10-CM | POA: Insufficient documentation

## 2022-05-21 DIAGNOSIS — I350 Nonrheumatic aortic (valve) stenosis: Secondary | ICD-10-CM

## 2022-05-21 NOTE — Patient Instructions (Signed)
  Lab Work:  Your physician recommends that you return for lab work in:6 [facility]  If you have labs (blood work) drawn today and your tests are completely normal, you will receive your results only by: [employer] (if you have [place]) OR A paper copy in the mail If you have any lab test that is abnormal or we need to change your treatment, we will call you to review the results.    Follow-Up: At [facility], you and your health needs are our priority.  As part of our continuing mission to provide you with exceptional heart care, we have created designated Provider Care Teams.  These Care Teams include your primary Cardiologist (physician) and Advanced Practice Providers (APPs -  Physician Assistants and Nurse Practitioners) who all work together to provide you with the care you need, when you need it.  We recommend signing up for the patient portal called "MyChart".  Sign up information is provided on this After Visit Summary.  MyChart is used to connect with patients for Virtual Visits (Telemedicine).  Patients are able to view lab/test results, encounter notes, upcoming appointments, etc.  Non-urgent messages can be sent to your provider as well.   To learn more about what you can do with MyChart, go to [email or web address].    Your next appointment:   6 month(s)  Provider:   Kirk Ruths, MD
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
## 2022-06-16 ENCOUNTER — Encounter: Payer: Self-pay | Admitting: Urology

## 2022-06-16 ENCOUNTER — Ambulatory Visit (INDEPENDENT_AMBULATORY_CARE_PROVIDER_SITE_OTHER): Payer: Medicare Other | Admitting: Urology

## 2022-06-16 VITALS — BP 120/51 | HR 71

## 2022-06-16 DIAGNOSIS — R3915 Urgency of urination: Secondary | ICD-10-CM

## 2022-06-16 DIAGNOSIS — R350 Frequency of micturition: Secondary | ICD-10-CM

## 2022-06-16 DIAGNOSIS — N401 Enlarged prostate with lower urinary tract symptoms: Secondary | ICD-10-CM

## 2022-06-16 DIAGNOSIS — N5201 Erectile dysfunction due to arterial insufficiency: Secondary | ICD-10-CM

## 2022-06-18 ENCOUNTER — Ambulatory Visit: Payer: Medicare Other | Admitting: Internal Medicine

## 2022-06-30 ENCOUNTER — Other Ambulatory Visit (HOSPITAL_COMMUNITY)
Admission: RE | Admit: 2022-06-30 | Discharge: 2022-06-30 | Disposition: A | Discharge status: Home / Self Care | Payer: Medicare Other | Source: Ambulatory Visit | Attending: Cardiology | Admitting: Cardiology

## 2022-06-30 DIAGNOSIS — E785 Hyperlipidemia, unspecified: Secondary | ICD-10-CM | POA: Diagnosis not present

## 2022-06-30 LAB — HEPATIC FUNCTION PANEL
ALT: 17 U/L (ref 0–44)
AST: 16 U/L (ref 15–41)
Albumin: 4 g/dL (ref 3.5–5.0)
Alkaline Phosphatase: 60 U/L (ref 38–126)
Bilirubin, Direct: 0.1 mg/dL (ref 0.0–0.2)
Indirect Bilirubin: 0.7 mg/dL (ref 0.3–0.9)
Total Bilirubin: 0.8 mg/dL (ref 0.3–1.2)
Total Protein: 7.5 g/dL (ref 6.5–8.1)

## 2022-06-30 LAB — LIPID PANEL
Cholesterol: 174 mg/dL (ref 0–200)
HDL: 36 mg/dL — ABNORMAL LOW (ref >40)
LDL Cholesterol: 108 mg/dL — ABNORMAL HIGH (ref 0–99)
Total CHOL/HDL Ratio: 4.8 RATIO
Triglycerides: 152 mg/dL — ABNORMAL HIGH (ref <150)
VLDL: 30 mg/dL (ref 0–40)

## 2022-07-01 ENCOUNTER — Telehealth: Payer: Self-pay | Admitting: *Deleted

## 2022-07-01 DIAGNOSIS — E7800 Pure hypercholesterolemia, unspecified: Secondary | ICD-10-CM

## 2022-07-01 MED ORDER — EZETIMIBE 10 MG PO TABS: 10.0000 mg | ORAL_TABLET | Freq: Every day | ORAL | 6 refills | Status: DC

## 2022-07-01 NOTE — Telephone Encounter (Signed)
-----   Message from Lewayne Bunting, MD sent at 06/30/2022  8:58 AM EDT ----- Add Zetia 10 mg daily.  Check lipids and liver in 8 weeks. Olga Millers

## 2022-07-01 NOTE — Telephone Encounter (Signed)
Spoke with pt wife, Aware of dr crenshaw's recommendations. New script sent to the pharmacy Lab orders mailed to the pt  

## 2022-07-02 ENCOUNTER — Encounter: Payer: Self-pay | Admitting: Internal Medicine

## 2022-07-02 ENCOUNTER — Ambulatory Visit (INDEPENDENT_AMBULATORY_CARE_PROVIDER_SITE_OTHER): Payer: Medicare Other | Admitting: Internal Medicine

## 2022-07-02 VITALS — BP 120/82 | HR 67 | Temp 98.2°F | Ht 70.5 in | Wt 183.0 lb

## 2022-07-02 DIAGNOSIS — E785 Hyperlipidemia, unspecified: Secondary | ICD-10-CM | POA: Diagnosis not present

## 2022-07-02 DIAGNOSIS — R634 Abnormal weight loss: Secondary | ICD-10-CM

## 2022-07-02 DIAGNOSIS — F329 Major depressive disorder, single episode, unspecified: Secondary | ICD-10-CM

## 2022-07-02 DIAGNOSIS — E1159 Type 2 diabetes mellitus with other circulatory complications: Secondary | ICD-10-CM

## 2022-07-02 DIAGNOSIS — K2100 Gastro-esophageal reflux disease with esophagitis, without bleeding: Secondary | ICD-10-CM | POA: Diagnosis not present

## 2022-07-02 NOTE — Assessment & Plan Note (Signed)
On Citalopram at HS 

## 2022-07-02 NOTE — Progress Notes (Signed)
Subjective:  [name]: Andrew Getting., male    DOB: February 07, 1941  Age: 82 y.o. MRN: [id number]  CC: No chief complaint on file.   HPI Andrew EVETTS Sr. presents for CAD, HTN, dyslipidemia Losartan was stopped  Outpatient Medications Prior to Visit  Medication Sig Dispense Refill   acyclovir (ZOVIRAX) 200 MG capsule TAKE 1 CAPSULE(200 MG) BY MOUTH TWICE DAILY 180 capsule 3   amLODipine (NORVASC) 5 MG tablet Take 1 tablet (5 mg total) by mouth daily. 90 tablet 3   ammonium lactate (LAC-HYDRIN) 12 % lotion APPLY TO THE TO THE AFFECTED AREA DAILY 400 g 0   aspirin EC 81 MG tablet Take 1 tablet (81 mg total) by mouth daily. Swallow whole. 30 tablet 12   Cholecalciferol 1000 units tablet Take 1,000 Units by mouth daily.     citalopram (CELEXA) 10 MG tablet TAKE 1 TABLET(10 MG) BY MOUTH DAILY 90 tablet 3   clopidogrel (PLAVIX) 75 MG tablet Take 1 tablet (75 mg total) by mouth daily. 90 tablet 3   Cyanocobalamin (VITAMIN B-12) 1000 MCG SUBL Place 1 tablet (1,000 mcg total) under the tongue daily. 100 tablet 3   ezetimibe (ZETIA) 10 MG tablet Take 1 tablet (10 mg total) by mouth daily. 30 tablet 6   glucose blood (ONETOUCH VERIO) test strip check blood sugar In Vitro once a day DX: E11.42 for 90 days     ipratropium (ATROVENT) 0.03 % nasal spray Place 2 sprays into both nostrils 3 (three) times daily as needed for rhinitis. 30 mL 12   Loperamide HCl (IMODIUM A-D PO) Take by mouth as needed.     metFORMIN (GLUCOPHAGE-XR) 500 MG 24 hr tablet Take 1,000 mg by mouth daily with supper.   3   metoprolol succinate (TOPROL-XL) 25 MG 24 hr tablet Take 1 tablet (25 mg total) by mouth daily. 90 tablet 3   nitroGLYCERIN (NITROSTAT) 0.4 MG SL tablet Place 1 tablet (0.4 mg total) under the tongue every 5 (five) minutes x 3 doses as needed for chest pain. 25 tablet 12   pantoprazole (PROTONIX) 40 MG tablet TAKE 1 TABLET BY MOUTH EVERY DAY 90 tablet 3   tamsulosin (FLOMAX) 0.4 MG CAPS capsule TAKE 1  CAPSULE BY MOUTH EVERY NIGHT AT BEDTIME 90 capsule 1   ZYPITAMAG 4 MG TABS 1 PO QD 90 tablet 3   azithromycin (ZITHROMAX) 500 MG tablet Take 1 tablet (500 mg total) by mouth daily. 7 tablet 0   losartan (COZAAR) 100 MG tablet Take 1 tablet (100 mg total) by mouth daily. 90 tablet 3   promethazine-dextromethorphan (PROMETHAZINE-DM) 6.25-15 MG/5ML syrup Take 5 mLs by mouth 4 (four) times daily as needed for cough. 240 mL 0   No facility-administered medications prior to visit.    ROS: Review of Systems  Constitutional:  Positive for fatigue. Negative for appetite change and unexpected weight change.  HENT:  Negative for congestion, nosebleeds, sneezing, sore throat and trouble swallowing.   Eyes:  Negative for itching and visual disturbance.  Respiratory:  Negative for cough.   Cardiovascular:  Negative for chest pain, palpitations and leg swelling.  Gastrointestinal:  Negative for abdominal distention, blood in stool, diarrhea and nausea.  Genitourinary:  Negative for frequency and hematuria.  Musculoskeletal:  Positive for arthralgias. Negative for back pain, gait problem, joint swelling and neck pain.  Skin:  Negative for rash.  Neurological:  Negative for dizziness, tremors, speech difficulty and weakness.  Psychiatric/Behavioral:  Negative for agitation, dysphoric  mood and sleep disturbance. The patient is not nervous/anxious.     Objective:  BP 120/82 (BP Location: Left Arm, Patient Position: Sitting, Cuff Size: Normal)   Pulse 67   Temp 98.2 F (36.8 C) (Oral)   Ht 5' 10.5" (1.791 m)   Wt 183 lb (83 kg)   SpO2 97%   BMI 25.89 kg/m   BP Readings from Last 3 Encounters:  07/02/22 120/82  06/16/22 (!) 120/51  05/21/22 130/68    Wt Readings from Last 3 Encounters:  07/02/22 183 lb (83 kg)  05/21/22 181 lb 6.4 oz (82.3 kg)  05/19/22 183 lb 6.4 oz (83.2 kg)    Physical Exam Constitutional:      General: He is not in acute distress.    Appearance: Normal appearance. He  is well-developed.     Comments: NAD  Eyes:     Conjunctiva/sclera: Conjunctivae normal.     Pupils: Pupils are equal, round, and reactive to light.  Neck:     Thyroid: No thyromegaly.     Vascular: No JVD.  Cardiovascular:     Rate and Rhythm: Normal rate and regular rhythm.     Heart sounds: Normal heart sounds. No murmur heard.    No friction rub. No gallop.  Pulmonary:     Effort: Pulmonary effort is normal. No respiratory distress.     Breath sounds: Normal breath sounds. No wheezing or rales.  Chest:     Chest wall: No tenderness.  Abdominal:     General: Bowel sounds are normal. There is no distension.     Palpations: Abdomen is soft. There is no mass.     Tenderness: There is no abdominal tenderness. There is no guarding or rebound.  Musculoskeletal:        General: No tenderness. Normal range of motion.     Cervical back: Normal range of motion.  Lymphadenopathy:     Cervical: No cervical adenopathy.  Skin:    General: Skin is warm and dry.     Findings: No rash.  Neurological:     Mental Status: He is alert and oriented to person, place, and time.     Cranial Nerves: No cranial nerve deficit.     Motor: No abnormal muscle tone.     Coordination: Coordination normal.     Gait: Gait normal.     Deep Tendon Reflexes: Reflexes are normal and symmetric.  Psychiatric:        Behavior: Behavior normal.        Thought Content: Thought content normal.        Judgment: Judgment normal.     Lab Results  Component Value Date   WBC 11.5 (H) 04/21/2022   HGB 12.2 (L) 04/21/2022   HCT 37.9 04/21/2022   PLT 359 04/21/2022   GLUCOSE 141 (H) 04/21/2022   CHOL 174 06/30/2022   TRIG 152 (H) 06/30/2022   HDL 36 (L) 06/30/2022   LDLDIRECT 51.0 07/24/2016   LDLCALC 108 (H) 06/30/2022   ALT 17 06/30/2022   AST 16 06/30/2022   NA 140 04/21/2022   K 4.9 04/21/2022   CL 103 04/21/2022   CREATININE 1.33 (H) 04/21/2022   BUN 18 04/21/2022   CO2 25 04/21/2022   TSH 1.550  04/21/2022   PSA 1.75 07/24/2016   INR 1.0 07/13/2018   HGBA1C 7.0 (H) 10/31/2021   MICROALBUR <0.7 10/31/2020    No results found.  Assessment & Plan:   Problem List Items Addressed This Visit  Dyslipidemia    Doing OK on Livalo      Depression    On Citalopram at HS      GERD    On Protonix      DM II (diabetes mellitus, type II), controlled (HCC) - Primary    On Metformin Check A1c      Weight loss    Doing well         No orders of the defined types were placed in this encounter.     Follow-up: Return in about 3 months (around 10/02/2022) for a follow-up visit.  Sonda Primes, MD

## 2022-07-02 NOTE — Assessment & Plan Note (Signed)
Doing OK on Livalo

## 2022-07-02 NOTE — Assessment & Plan Note (Signed)
On Protonix 

## 2022-07-02 NOTE — Assessment & Plan Note (Signed)
On Metformin Check A1c 

## 2022-07-02 NOTE — Assessment & Plan Note (Signed)
Doing well 

## 2022-07-30 ENCOUNTER — Ambulatory Visit (INDEPENDENT_AMBULATORY_CARE_PROVIDER_SITE_OTHER): Payer: Medicare Other | Admitting: Pulmonary Disease

## 2022-07-30 ENCOUNTER — Encounter: Payer: Self-pay | Admitting: Pulmonary Disease

## 2022-07-30 VITALS — BP 106/63 | HR 69 | Ht 70.0 in | Wt 188.4 lb

## 2022-07-30 DIAGNOSIS — G4733 Obstructive sleep apnea (adult) (pediatric): Secondary | ICD-10-CM

## 2022-07-30 DIAGNOSIS — J310 Chronic rhinitis: Secondary | ICD-10-CM | POA: Diagnosis not present

## 2022-07-30 DIAGNOSIS — R0609 Other forms of dyspnea: Secondary | ICD-10-CM

## 2022-07-30 MED ORDER — SPIRIVA RESPIMAT 2.5 MCG/ACT IN AERS: 2.0000 | INHALATION_SPRAY | Freq: Every day | RESPIRATORY_TRACT | 5 refills | Status: DC

## 2022-07-30 NOTE — Patient Instructions (Signed)
Spiriva two puffs daily  Follow up in 2 months

## 2022-07-30 NOTE — Progress Notes (Signed)
[facility]  Chief Complaint  Patient presents with   Follow-up    Constitutional:  BP 106/63   Pulse 69   Ht 5\' 10"  (1.778 m)   Wt 188 lb 6.4 oz (85.5 kg)   SpO2 94%   BMI 27.03 kg/m   Past Medical History:  CAD, Carotid artery disease, CVA, HLD, HTN, GERD, GN, HH, PUD, chronic back pain, Anxiety, Aortic stenosis, DM  Past Surgical History:  He  has a past surgical history that includes Cardiac catheterization (2007); Coronary angioplasty with stent (07/2011); Inguinal hernia repair (~ 2002); Hernia repair (~ 2002); TEE without cardioversion (08/11/2011); Upper gastrointestinal endoscopy; Colonoscopy; and LEFT HEART CATH AND CORONARY ANGIOGRAPHY (N/A, 09/22/2021).  Brief Summary:  Andrew Malone. is a 82 y.o. male with obstructive sleep apnea and short sleep cycle.      Subjective:   He is here with his wife.  Felt better after several course of ABx, but still has cough and gets winded.  Brings up clear sputum.  Still waiting to get his new CPAP.  He tried an inhaler before but had to stop it because it made his heart race (?albuterol).  Physical Exam:   Appearance - well kempt   ENMT - no sinus tenderness, no oral exudate, no LAN, Mallampati 3 airway, no stridor  Respiratory - equal breath sounds bilaterally, no wheezing or rales  CV - s1s2 regular rate and rhythm, 3/6 SM  Ext - no clubbing, no edema  Skin - no rashes  Psych - normal mood and affect     Sleep Tests:  PSG 09/14/10>>AHI 8, SpO2 low 84% CPAP 11/02/20 to 12/01/20 >> used on 22 of 30 nights with average 6 hrs 45 min.  Average AHI 1.1 with CPAP 8 cm H2O CPAP 03/09/22 >> suboptimal; did best betwee 6 to 10 cm H2O; centrals with higher pressures; increase PLMI  CPAP 04/18/22 to 05/17/22 >> used on 10 of 30 nights with average 5 hrs 57 min.  Average AHI 2.1 with CPAP 8 cm H2O  Cardiac Tests:  Echo 03/30/22 >> EF 60 to 65%, severe LVH, mod LA dilation, mild  MR, mod AS, aortic root 39 mm  Social History:  He  reports that he has never smoked. He quit smokeless tobacco use about 47 years ago.  His smokeless tobacco use included chew. He reports that he does not drink alcohol and does not use drugs.  Family History:  His family history includes Breast cancer in his sister; Coronary artery disease in his mother; Diabetes in his brother, mother, and sister; Emphysema in his brother; Heart attack in his mother; Hyperlipidemia in his mother and sister; Hypertension in his mother and sister; Lung cancer in his father and sister; Stroke in his mother.     Assessment/Plan:   Chronic productive cough with dyspnea. - will give him trial of spiriva  Obstructive sleep apnea. - he is compliant with CPAP and reports benefit - he is trying to switch to [facility] for his DME - current CPAP ordered in January 2024; will check on status of getting his replacement device - continue CPAP 8 cm H2O  [place] and CPAP rhinitis. - prn atrovent nasal spray  Aortic stenosis, CAD s/p stent, HTN, HLD. - followed by Dr. Olga Millers with [facility]  Time Spent Involved in Patient Care on Day of Examination:  37 minutes  Follow up:   Patient Instructions  Spiriva two puffs  daily  Follow up in 2 months  Medication List:   Allergies as of 07/30/2022       Reactions   Empagliflozin Other (See Comments)   Bleeding in the groin   Lipitor [atorvastatin]    Leg weakness, burning - 75% better off Lipitor   Lisinopril    cough   Trulicity [dulaglutide]         Medication List        Accurate as of Jul 30, 2022  2:06 PM. If you have any questions, ask your nurse or doctor.          acyclovir 200 MG capsule Commonly known as: ZOVIRAX TAKE 1 CAPSULE(200 MG) BY MOUTH TWICE DAILY   amLODipine 5 MG tablet Commonly known as: NORVASC Take 1 tablet (5 mg total) by mouth daily.   ammonium lactate 12 % lotion Commonly known as:  LAC-HYDRIN APPLY TO THE TO THE AFFECTED AREA DAILY   aspirin EC 81 MG tablet Take 1 tablet (81 mg total) by mouth daily. Swallow whole.   Cholecalciferol 25 MCG (1000 UT) tablet Take 1,000 Units by mouth daily.   citalopram 10 MG tablet Commonly known as: CELEXA TAKE 1 TABLET(10 MG) BY MOUTH DAILY   clopidogrel 75 MG tablet Commonly known as: PLAVIX Take 1 tablet (75 mg total) by mouth daily.   ezetimibe 10 MG tablet Commonly known as: ZETIA Take 1 tablet (10 mg total) by mouth daily.   IMODIUM A-D PO Take by mouth as needed.   ipratropium 0.03 % nasal spray Commonly known as: ATROVENT Place 2 sprays into both nostrils 3 (three) times daily as needed for rhinitis.   metFORMIN 500 MG 24 hr tablet Commonly known as: GLUCOPHAGE-XR Take 1,000 mg by mouth daily with supper.   metoprolol succinate 25 MG 24 hr tablet Commonly known as: TOPROL-XL Take 1 tablet (25 mg total) by mouth daily.   nitroGLYCERIN 0.4 MG SL tablet Commonly known as: NITROSTAT Place 1 tablet (0.4 mg total) under the tongue every 5 (five) minutes x 3 doses as needed for chest pain.   OneTouch Verio test strip Generic drug: glucose blood check blood sugar In Vitro once a day DX: E11.42 for 90 days   pantoprazole 40 MG tablet Commonly known as: PROTONIX TAKE 1 TABLET BY MOUTH EVERY DAY   Spiriva Respimat 2.5 MCG/ACT Aers Generic drug: Tiotropium Bromide Monohydrate Inhale 2 puffs into the lungs daily. Started by: Coralyn Helling, MD   tamsulosin 0.4 MG Caps capsule Commonly known as: FLOMAX TAKE 1 CAPSULE BY MOUTH EVERY NIGHT AT BEDTIME   Vitamin B-12 1000 MCG Subl Place 1 tablet (1,000 mcg total) under the tongue daily.   Zypitamag 4 MG Tabs Generic drug: Pitavastatin Magnesium 1 PO QD        Signature:  Coralyn Helling, MD [facility] Pager - [phone] 07/30/2022, 2:06 PM

## 2022-07-31 NOTE — Addendum Note (Signed)
Addended by: Jaynee Eagles on: 07/31/2022 10:12 AM   Modules accepted: Orders

## 2022-08-05 ENCOUNTER — Other Ambulatory Visit: Payer: Self-pay | Admitting: Urology

## 2022-08-05 DIAGNOSIS — N401 Enlarged prostate with lower urinary tract symptoms: Secondary | ICD-10-CM
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
## 2022-08-18 ENCOUNTER — Ambulatory Visit (INDEPENDENT_AMBULATORY_CARE_PROVIDER_SITE_OTHER): Payer: Medicare Other | Admitting: Urology

## 2022-08-18 ENCOUNTER — Encounter: Payer: Self-pay | Admitting: Urology

## 2022-08-18 DIAGNOSIS — R350 Frequency of micturition: Secondary | ICD-10-CM

## 2022-08-18 DIAGNOSIS — N401 Enlarged prostate with lower urinary tract symptoms: Secondary | ICD-10-CM | POA: Diagnosis not present

## 2022-08-18 DIAGNOSIS — N5201 Erectile dysfunction due to arterial insufficiency: Secondary | ICD-10-CM

## 2022-08-18 DIAGNOSIS — R351 Nocturia: Secondary | ICD-10-CM | POA: Diagnosis not present

## 2022-08-18 DIAGNOSIS — R3915 Urgency of urination: Secondary | ICD-10-CM

## 2022-08-18 LAB — URINALYSIS, ROUTINE W REFLEX MICROSCOPIC
Bilirubin, UA: NEGATIVE
Glucose, UA: NEGATIVE
Ketones, UA: NEGATIVE
Leukocytes,UA: NEGATIVE
Nitrite, UA: NEGATIVE
Specific Gravity, UA: 1.025 (ref 1.005–1.030)
Urobilinogen, Ur: 0.2 mg/dL (ref 0.2–1.0)
pH, UA: 5.5 (ref 5.0–7.5)

## 2022-08-18 LAB — MICROSCOPIC EXAMINATION: Bacteria, UA: NONE SEEN

## 2022-08-25 DIAGNOSIS — E1142 Type 2 diabetes mellitus with diabetic polyneuropathy: Secondary | ICD-10-CM | POA: Diagnosis not present

## 2022-08-25 DIAGNOSIS — Z7984 Long term (current) use of oral hypoglycemic drugs: Secondary | ICD-10-CM | POA: Diagnosis not present

## 2022-08-25 DIAGNOSIS — I779 Disorder of arteries and arterioles, unspecified: Secondary | ICD-10-CM | POA: Diagnosis not present

## 2022-08-25 DIAGNOSIS — I10 Essential (primary) hypertension: Secondary | ICD-10-CM | POA: Diagnosis not present

## 2022-08-25 DIAGNOSIS — Z87448 Personal history of other diseases of urinary system: Secondary | ICD-10-CM | POA: Diagnosis not present

## 2022-08-25 DIAGNOSIS — N1831 Chronic kidney disease, stage 3a: Secondary | ICD-10-CM | POA: Diagnosis not present

## 2022-08-26 ENCOUNTER — Telehealth: Payer: Self-pay | Admitting: Pulmonary Disease

## 2022-08-26 NOTE — Telephone Encounter (Signed)
[facility] calling for copy of sleep study performed in 2012 by [place]: 10/17/2010 Office Visit Sleep apnea   Please call Marissa Nestle or fax to her attn.  Her # is [phone]  FAX [phone]

## 2022-08-31 ENCOUNTER — Encounter: Payer: Self-pay | Admitting: *Deleted

## 2022-08-31 ENCOUNTER — Other Ambulatory Visit (HOSPITAL_COMMUNITY)
Admission: RE | Admit: 2022-08-31 | Discharge: 2022-08-31 | Disposition: A | Discharge status: Home / Self Care | Payer: Medicare Other | Source: Ambulatory Visit | Attending: Cardiology | Admitting: Cardiology

## 2022-08-31 DIAGNOSIS — E785 Hyperlipidemia, unspecified: Secondary | ICD-10-CM | POA: Insufficient documentation

## 2022-08-31 DIAGNOSIS — I2510 Atherosclerotic heart disease of native coronary artery without angina pectoris: Secondary | ICD-10-CM | POA: Diagnosis not present

## 2022-08-31 LAB — LIPID PANEL
Cholesterol: 121 mg/dL (ref 0–200)
HDL: 38 mg/dL — ABNORMAL LOW (ref >40)
LDL Cholesterol: 59 mg/dL (ref 0–99)
Total CHOL/HDL Ratio: 3.2 RATIO
Triglycerides: 120 mg/dL (ref <150)
VLDL: 24 mg/dL (ref 0–40)

## 2022-08-31 LAB — HEPATIC FUNCTION PANEL
ALT: 18 U/L (ref 0–44)
AST: 15 U/L (ref 15–41)
Albumin: 3.9 g/dL (ref 3.5–5.0)
Alkaline Phosphatase: 59 U/L (ref 38–126)
Bilirubin, Direct: 0.1 mg/dL (ref 0.0–0.2)
Indirect Bilirubin: 0.6 mg/dL (ref 0.3–0.9)
Total Bilirubin: 0.7 mg/dL (ref 0.3–1.2)
Total Protein: 7.2 g/dL (ref 6.5–8.1)

## 2022-09-02 NOTE — Telephone Encounter (Signed)
ATC zaiyah. LVMTCB.

## 2022-09-08 ENCOUNTER — Telehealth: Payer: Self-pay | Admitting: Pulmonary Disease

## 2022-09-08 ENCOUNTER — Ambulatory Visit (HOSPITAL_COMMUNITY)
Admission: RE | Admit: 2022-09-08 | Discharge: 2022-09-08 | Disposition: A | Discharge status: Home / Self Care | Payer: Medicare Other | Source: Ambulatory Visit | Attending: Cardiology | Admitting: Cardiology

## 2022-09-08 ENCOUNTER — Encounter: Payer: Self-pay | Admitting: *Deleted

## 2022-09-08 DIAGNOSIS — Z95828 Presence of other vascular implants and grafts: Secondary | ICD-10-CM | POA: Insufficient documentation

## 2022-09-08 DIAGNOSIS — I6523 Occlusion and stenosis of bilateral carotid arteries: Secondary | ICD-10-CM | POA: Diagnosis not present

## 2022-09-08 NOTE — Telephone Encounter (Signed)
Please try again so we can close encounter.

## 2022-09-08 NOTE — Telephone Encounter (Signed)
He has a sleep study from 09/14/10.  Please fax this to [facility] at [phone].

## 2022-09-09 ENCOUNTER — Other Ambulatory Visit: Payer: Self-pay | Admitting: *Deleted

## 2022-09-09 DIAGNOSIS — I6523 Occlusion and stenosis of bilateral carotid arteries: Secondary | ICD-10-CM

## 2022-09-10 NOTE — Telephone Encounter (Signed)
I have faxed the sleep study from 2012 to [facility].

## 2022-09-11 ENCOUNTER — Ambulatory Visit: Payer: Medicare Other | Admitting: Pulmonary Disease

## 2022-09-11 ENCOUNTER — Encounter: Payer: Self-pay | Admitting: Pulmonary Disease

## 2022-09-11 VITALS — BP 126/58 | HR 64 | Ht 70.0 in | Wt 186.6 lb

## 2022-09-11 DIAGNOSIS — G4733 Obstructive sleep apnea (adult) (pediatric): Secondary | ICD-10-CM | POA: Diagnosis not present

## 2022-09-11 DIAGNOSIS — R0609 Other forms of dyspnea: Secondary | ICD-10-CM

## 2022-09-11 NOTE — Patient Instructions (Signed)
Follow up in 6 months with Dr. Vassie Loll

## 2022-09-11 NOTE — Progress Notes (Signed)
[facility]  Chief Complaint  Patient presents with   Follow-up    Just started using his CPAP 09/10/22- denies any issues with machine so far. His breathing is about the same since the last visit. He tried taking the spiriva but d/c this due to facial swelling.     Constitutional:  BP (!) 126/58 (BP Location: Left Arm, Cuff Size: Normal)   Pulse 64   Ht 5\' 10"  (1.778 m)   Wt 186 lb 9.6 oz (84.6 kg)   SpO2 97% Comment: on RA  BMI 26.77 kg/m   Past Medical History:  CAD, Carotid artery disease, CVA, HLD, HTN, GERD, GN, HH, PUD, chronic back pain, Anxiety, Aortic stenosis, DM  Past Surgical History:  He  has a past surgical history that includes Cardiac catheterization (2007); Coronary angioplasty with stent (07/2011); Inguinal hernia repair (~ 2002); Hernia repair (~ 2002); TEE without cardioversion (08/11/2011); Upper gastrointestinal endoscopy; Colonoscopy; and LEFT HEART CATH AND CORONARY ANGIOGRAPHY (N/A, 09/22/2021).  Brief Summary:  Andrew Tungate. is a 82 y.o. male with obstructive sleep apnea and short sleep cycle.      Subjective:   He tried spiriva.  This caused swelling in his face.  He didn't feel like this helped his breathing.  He is not having cough or congestion.  Sinuses are much of an issue at present either.  Not having chest pain or dizziness.  Finally go his new CPAP machine.  He feels this has made a significant difference with his sleep.  No issues with mask fit.  Physical Exam:   Appearance - well kempt   ENMT - no sinus tenderness, no oral exudate, no LAN, Mallampati 3 airway, no stridor  Respiratory - equal breath sounds bilaterally, no wheezing or rales  CV - s1s2 regular rate and rhythm, 3/6 SM  Ext - no clubbing, no edema  Skin - no rashes  Psych - normal mood and affect     Sleep Tests:  PSG 09/14/10>>AHI 8, SpO2 low 84% CPAP 11/02/20 to 12/01/20 >> used on 22 of 30 nights with average 6  hrs 45 min.  Average AHI 1.1 with CPAP 8 cm H2O CPAP 03/09/22 >> suboptimal; did best betwee 6 to 10 cm H2O; centrals with higher pressures; increase PLMI  CPAP 04/18/22 to 05/17/22 >> used on 10 of 30 nights with average 5 hrs 57 min.  Average AHI 2.1 with CPAP 8 cm H2O  Cardiac Tests:  Echo 03/30/22 >> EF 60 to 65%, severe LVH, mod LA dilation, mild MR, mod AS, aortic root 39 mm  Social History:  He  reports that he has never smoked. He quit smokeless tobacco use about 47 years ago.  His smokeless tobacco use included chew. He reports that he does not drink alcohol and does not use drugs.  Family History:  His family history includes Breast cancer in his sister; Coronary artery disease in his mother; Diabetes in his brother, mother, and sister; Emphysema in his brother; Heart attack in his mother; Hyperlipidemia in his mother and sister; Hypertension in his mother and sister; Lung cancer in his father and sister; Stroke in his mother.     Assessment/Plan:   Dyspnea on exertion with intermittent cough. - spiriva caused facial swelling - likely has intermittent productive cough from allergies, but not much of an issue at present - his aortic stenosis is likely contributing to symptoms of dyspnea  Obstructive sleep apnea. - he is compliant  with CPAP and reports benefit - he uses [place] for his DME - current CPAP received in July 2024 - continue CPAP 8 cm H2O  [place] and CPAP rhinitis. - prn atrovent nasal spray  Aortic stenosis, CAD s/p stent, HTN, HLD. - followed by Dr. Olga Millers with [facility]  Time Spent Involved in Patient Care on Day of Examination:  26 minutes  Follow up:   Patient Instructions  Follow up in 6 months with Dr. Vassie Loll  Medication List:   Allergies as of 09/11/2022       Reactions   Empagliflozin Other (See Comments)   Bleeding in the groin   Lipitor [atorvastatin]    Leg weakness, burning - 75% better off Lipitor   Lisinopril     cough   Spiriva Respimat [tiotropium Bromide Monohydrate] Swelling   Trulicity [dulaglutide]         Medication List        Accurate as of September 11, 2022 10:39 AM. If you have any questions, ask your nurse or doctor.          STOP taking these medications    ipratropium 0.03 % nasal spray Commonly known as: ATROVENT Stopped by: Coralyn Helling   Spiriva Respimat 2.5 MCG/ACT Aers Generic drug: Tiotropium Bromide Monohydrate Stopped by: Coralyn Helling       TAKE these medications    acyclovir 200 MG capsule Commonly known as: ZOVIRAX TAKE 1 CAPSULE(200 MG) BY MOUTH TWICE DAILY   amLODipine 5 MG tablet Commonly known as: NORVASC Take 1 tablet (5 mg total) by mouth daily.   ammonium lactate 12 % lotion Commonly known as: LAC-HYDRIN APPLY TO THE TO THE AFFECTED AREA DAILY   aspirin EC 81 MG tablet Take 1 tablet (81 mg total) by mouth daily. Swallow whole.   Cholecalciferol 25 MCG (1000 UT) tablet Take 1,000 Units by mouth daily.   citalopram 10 MG tablet Commonly known as: CELEXA TAKE 1 TABLET(10 MG) BY MOUTH DAILY   clopidogrel 75 MG tablet Commonly known as: PLAVIX Take 1 tablet (75 mg total) by mouth daily.   ezetimibe 10 MG tablet Commonly known as: ZETIA Take 1 tablet (10 mg total) by mouth daily.   IMODIUM A-D PO Take by mouth as needed.   metFORMIN 500 MG 24 hr tablet Commonly known as: GLUCOPHAGE-XR Take 1,000 mg by mouth daily with supper.   metoprolol succinate 25 MG 24 hr tablet Commonly known as: TOPROL-XL Take 1 tablet (25 mg total) by mouth daily.   nitroGLYCERIN 0.4 MG SL tablet Commonly known as: NITROSTAT Place 1 tablet (0.4 mg total) under the tongue every 5 (five) minutes x 3 doses as needed for chest pain.   OneTouch Verio test strip Generic drug: glucose blood check blood sugar In Vitro once a day DX: E11.42 for 90 days   pantoprazole 40 MG tablet Commonly known as: PROTONIX TAKE 1 TABLET BY MOUTH EVERY DAY   tamsulosin  0.4 MG Caps capsule Commonly known as: FLOMAX TAKE 1 CAPSULE(0.4 MG) BY MOUTH AT BEDTIME   Vitamin B-12 1000 MCG Subl Place 1 tablet (1,000 mcg total) under the tongue daily.   Zypitamag 4 MG Tabs Generic drug: Pitavastatin Magnesium 1 PO QD        Signature:  Coralyn Helling, MD [facility] Pager - [phone] 09/11/2022, 10:39 AM

## 2022-09-15 NOTE — Telephone Encounter (Signed)
Sleep study from 2012 printed and faxed to [facility] at the number provided.

## 2022-10-05 DIAGNOSIS — Z1283 Encounter for screening for malignant neoplasm of skin: Secondary | ICD-10-CM | POA: Diagnosis not present

## 2022-10-05 DIAGNOSIS — L570 Actinic keratosis: Secondary | ICD-10-CM | POA: Diagnosis not present

## 2022-10-05 DIAGNOSIS — X32XXXD Exposure to sunlight, subsequent encounter: Secondary | ICD-10-CM | POA: Diagnosis not present

## 2022-10-05 DIAGNOSIS — D225 Melanocytic nevi of trunk: Secondary | ICD-10-CM | POA: Diagnosis not present

## 2022-10-06 ENCOUNTER — Ambulatory Visit (INDEPENDENT_AMBULATORY_CARE_PROVIDER_SITE_OTHER): Payer: Medicare Other | Admitting: Internal Medicine

## 2022-10-06 ENCOUNTER — Encounter: Payer: Self-pay | Admitting: Internal Medicine

## 2022-10-06 VITALS — BP 110/70 | HR 54 | Temp 98.6°F | Ht 70.0 in | Wt 190.0 lb

## 2022-10-06 DIAGNOSIS — E1159 Type 2 diabetes mellitus with other circulatory complications: Secondary | ICD-10-CM | POA: Diagnosis not present

## 2022-10-06 DIAGNOSIS — Z7984 Long term (current) use of oral hypoglycemic drugs: Secondary | ICD-10-CM

## 2022-10-06 DIAGNOSIS — G8929 Other chronic pain: Secondary | ICD-10-CM | POA: Diagnosis not present

## 2022-10-06 DIAGNOSIS — E785 Hyperlipidemia, unspecified: Secondary | ICD-10-CM | POA: Diagnosis not present

## 2022-10-06 DIAGNOSIS — M5440 Lumbago with sciatica, unspecified side: Secondary | ICD-10-CM

## 2022-10-06 DIAGNOSIS — B009 Herpesviral infection, unspecified: Secondary | ICD-10-CM

## 2022-10-06 DIAGNOSIS — E538 Deficiency of other specified B group vitamins: Secondary | ICD-10-CM

## 2022-10-06 DIAGNOSIS — I10 Essential (primary) hypertension: Secondary | ICD-10-CM

## 2022-10-06 MED ORDER — ACYCLOVIR 200 MG PO CAPS: ORAL_CAPSULE | ORAL | 2 refills | Status: DC

## 2022-10-06 NOTE — Assessment & Plan Note (Signed)
Doing OK on Livalo

## 2022-10-06 NOTE — Assessment & Plan Note (Signed)
On B12 

## 2022-10-06 NOTE — Assessment & Plan Note (Signed)
On Losartan, off HCTZ

## 2022-10-06 NOTE — Assessment & Plan Note (Signed)
Norco prn  Potential benefits of a long term opioids use as well as potential risks (i.e. addiction risk, apnea etc) and complications (i.e. Somnolence, constipation and others) were explained to the patient and were aknowledged. 

## 2022-10-06 NOTE — Assessment & Plan Note (Signed)
On Metformin.  Check A1c 

## 2022-10-06 NOTE — Progress Notes (Signed)
Subjective:  [name]: Andrew Getting., male    DOB: [birth year]  Age: 82 y.o. MRN: [id number]  CC: Follow-up (3 mnth f/u)   HPI Andrew RAYNOR Sr. presents for CAD, HTN, anxiety  Outpatient Medications Prior to Visit  Medication Sig Dispense Refill   amLODipine (NORVASC) 5 MG tablet Take 1 tablet (5 mg total) by mouth daily. 90 tablet 3   ammonium lactate (LAC-HYDRIN) 12 % lotion APPLY TO THE TO THE AFFECTED AREA DAILY 400 g 0   aspirin EC 81 MG tablet Take 1 tablet (81 mg total) by mouth daily. Swallow whole. 30 tablet 12   Cholecalciferol 1000 units tablet Take 1,000 Units by mouth daily.     citalopram (CELEXA) 10 MG tablet TAKE 1 TABLET(10 MG) BY MOUTH DAILY 90 tablet 3   clopidogrel (PLAVIX) 75 MG tablet Take 1 tablet (75 mg total) by mouth daily. 90 tablet 3   Cyanocobalamin (VITAMIN B-12) 1000 MCG SUBL Place 1 tablet (1,000 mcg total) under the tongue daily. 100 tablet 3   ezetimibe (ZETIA) 10 MG tablet Take 1 tablet (10 mg total) by mouth daily. 30 tablet 6   glucose blood (ONETOUCH VERIO) test strip check blood sugar In Vitro once a day DX: E11.42 for 90 days     Loperamide HCl (IMODIUM A-D PO) Take by mouth as needed.     metFORMIN (GLUCOPHAGE-XR) 500 MG 24 hr tablet Take 1,000 mg by mouth daily with supper.   3   metoprolol succinate (TOPROL-XL) 25 MG 24 hr tablet Take 1 tablet (25 mg total) by mouth daily. 90 tablet 3   nitroGLYCERIN (NITROSTAT) 0.4 MG SL tablet Place 1 tablet (0.4 mg total) under the tongue every 5 (five) minutes x 3 doses as needed for chest pain. 25 tablet 12   pantoprazole (PROTONIX) 40 MG tablet TAKE 1 TABLET BY MOUTH EVERY DAY 90 tablet 3   tamsulosin (FLOMAX) 0.4 MG CAPS capsule TAKE 1 CAPSULE(0.4 MG) BY MOUTH AT BEDTIME 90 capsule 3   ZYPITAMAG 4 MG TABS 1 PO QD 90 tablet 3   acyclovir (ZOVIRAX) 200 MG capsule TAKE 1 CAPSULE(200 MG) BY MOUTH TWICE DAILY 180 capsule 3   No facility-administered medications prior to visit.    ROS: Review of  Systems  Constitutional:  Negative for appetite change, fatigue and unexpected weight change.  HENT:  Negative for congestion, nosebleeds, sneezing, sore throat and trouble swallowing.   Eyes:  Negative for itching and visual disturbance.  Respiratory:  Negative for cough.   Cardiovascular:  Negative for chest pain, palpitations and leg swelling.  Gastrointestinal:  Negative for abdominal distention, blood in stool, diarrhea and nausea.  Genitourinary:  Negative for frequency and hematuria.  Musculoskeletal:  Negative for back pain, gait problem, joint swelling and neck pain.  Skin:  Negative for rash.  Neurological:  Negative for dizziness, tremors, speech difficulty and weakness.  Psychiatric/Behavioral:  Negative for agitation, dysphoric mood and sleep disturbance. The patient is not nervous/anxious.     Objective:  BP 110/70 (BP Location: Left Arm, Patient Position: Sitting, Cuff Size: Large)   Pulse (!) 54   Temp 98.6 F (37 C) (Oral)   Ht 5\' 10"  (1.778 m)   Wt 190 lb (86.2 kg)   SpO2 95%   BMI 27.26 kg/m   BP Readings from Last 3 Encounters:  10/06/22 110/70  09/11/22 (!) 126/58  07/30/22 106/63    Wt Readings from Last 3 Encounters:  10/06/22 190 lb (86.2 kg)  09/11/22 186 lb 9.6 oz (84.6 kg)  07/30/22 188 lb 6.4 oz (85.5 kg)    Physical Exam Constitutional:      General: He is not in acute distress.    Appearance: Normal appearance. He is well-developed.     Comments: NAD  Eyes:     Conjunctiva/sclera: Conjunctivae normal.     Pupils: Pupils are equal, round, and reactive to light.  Neck:     Thyroid: No thyromegaly.     Vascular: No JVD.  Cardiovascular:     Rate and Rhythm: Normal rate and regular rhythm.     Heart sounds: Normal heart sounds. No murmur heard.    No friction rub. No gallop.  Pulmonary:     Effort: Pulmonary effort is normal. No respiratory distress.     Breath sounds: Normal breath sounds. No wheezing or rales.  Chest:     Chest wall:  No tenderness.  Abdominal:     General: Bowel sounds are normal. There is no distension.     Palpations: Abdomen is soft. There is no mass.     Tenderness: There is no abdominal tenderness. There is no guarding or rebound.  Musculoskeletal:        General: No tenderness. Normal range of motion.     Cervical back: Normal range of motion.  Lymphadenopathy:     Cervical: No cervical adenopathy.  Skin:    General: Skin is warm and dry.     Findings: No rash.  Neurological:     Mental Status: He is alert and oriented to person, place, and time.     Cranial Nerves: No cranial nerve deficit.     Motor: No abnormal muscle tone.     Coordination: Coordination normal.     Gait: Gait normal.     Deep Tendon Reflexes: Reflexes are normal and symmetric.  Psychiatric:        Behavior: Behavior normal.        Thought Content: Thought content normal.        Judgment: Judgment normal.     Lab Results  Component Value Date   WBC 11.5 (H) 04/21/2022   HGB 12.2 (L) 04/21/2022   HCT 37.9 04/21/2022   PLT 359 04/21/2022   GLUCOSE 141 (H) 04/21/2022   CHOL 121 08/31/2022   TRIG 120 08/31/2022   HDL 38 (L) 08/31/2022   LDLDIRECT 51.0 07/24/2016   LDLCALC 59 08/31/2022   ALT 18 08/31/2022   AST 15 08/31/2022   NA 140 04/21/2022   K 4.9 04/21/2022   CL 103 04/21/2022   CREATININE 1.33 (H) 04/21/2022   BUN 18 04/21/2022   CO2 25 04/21/2022   TSH 1.550 04/21/2022   PSA 1.75 07/24/2016   INR 1.0 07/13/2018   HGBA1C 7.0 (H) 10/31/2021   MICROALBUR <0.7 10/31/2020    VAS US CAROTID  Result Date: 09/08/2022 Carotid Arterial Duplex Study Patient Name:  Andrew STUVER Sr.  Date of Exam:   09/08/2022 Medical Rec #: [id number]            Accession #:    [id number] Date of Birth: 1940-08-28            Patient Gender: M Patient Age:   20 years Exam Location:  [facility] Procedure:      VAS US CAROTID Referring Phys: Arlys John CRENSHAW  --------------------------------------------------------------------------------  Indications:       Carotid artery disease, Right stent and patient denies any  cerebrovascular symptoms. Risk Factors:      Hypertension, hyperlipidemia, Diabetes, no history of                    smoking. Comparison Study:  In 07/2021, a carotid artery duplex showed velocities of                    119/30 cm/s in the RICA, s/p stent placement and 126/25 cm/s                    in the LICA. Performing Technologist: Tyna Jaksch RVT  Examination Guidelines: A complete evaluation includes B-mode imaging, spectral Doppler, color Doppler, and power Doppler as needed of all accessible portions of each vessel. Bilateral testing is considered an integral part of a complete examination. Limited examinations for reoccurring indications may be performed as noted.  Right Carotid Findings: +--------+--------+--------+--------+------------------+--------+         PSV cm/sEDV cm/sStenosisPlaque DescriptionComments +--------+--------+--------+--------+------------------+--------+ CCA Prox89      12                                         +--------+--------+--------+--------+------------------+--------+ CCA Mid 80      15      <50%    heterogenous               +--------+--------+--------+--------+------------------+--------+ ECA     179     15              heterogenous               +--------+--------+--------+--------+------------------+--------+ +----------+--------+-------+---------+-------------------+           PSV cm/sEDV cmsDescribe Arm Pressure (mmHG) +----------+--------+-------+---------+-------------------+ [id number]            Turbulent122                 +----------+--------+-------+---------+-------------------+ +---------+--------+--+--------+-+---------+ VertebralPSV cm/s32EDV cm/s7Antegrade +---------+--------+--+--------+-+---------+  Right Stent(s):  +------------------------+--------+--------+--------+--------+--------+ Distal CCA to Distal ICAPSV cm/sEDV cm/sStenosisWaveformComments +------------------------+--------+--------+--------+--------+--------+ Prox to Stent           68      12                               +------------------------+--------+--------+--------+--------+--------+ Proximal Stent          75      17                               +------------------------+--------+--------+--------+--------+--------+ Mid Stent               74      16                               +------------------------+--------+--------+--------+--------+--------+ Distal Stent            71      21                               +------------------------+--------+--------+--------+--------+--------+ Distal to Stent         63      18  tortuous +------------------------+--------+--------+--------+--------+--------+ Widely patent right distal CCA to distal ICA stent without evidence of stenosis.  Left Carotid Findings: +----------+--------+--------+--------+--------------------------+--------+           PSV cm/sEDV cm/sStenosisPlaque Description        Comments +----------+--------+--------+--------+--------------------------+--------+ CCA Prox  95      12                                                 +----------+--------+--------+--------+--------------------------+--------+ CCA Distal79      17      <50%    heterogenous                       +----------+--------+--------+--------+--------------------------+--------+ ICA Prox  81      20              heterogenous and irregular         +----------+--------+--------+--------+--------------------------+--------+ ICA Mid   89      26      1-39%   heterogenous and irregular         +----------+--------+--------+--------+--------------------------+--------+ ICA Distal114     34                                         tortuous +----------+--------+--------+--------+--------------------------+--------+ ECA       147     15                                                 +----------+--------+--------+--------+--------------------------+--------+ +----------+--------+--------+----------------+-------------------+           PSV cm/sEDV cm/sDescribe        Arm Pressure (mmHG) +----------+--------+--------+----------------+-------------------+ [id number]              Multiphasic, [id number]                 +----------+--------+--------+----------------+-------------------+ +---------+--------+--+--------+--+---------+ VertebralPSV cm/s45EDV cm/s13Antegrade +---------+--------+--+--------+--+---------+   Summary: Right Carotid: Non-hemodynamically significant plaque <50% noted in the CCA.                Widely patent right distal CCA to distal ICA stent without                evidence of stenosis. Left Carotid: Velocities in the left ICA are consistent with a 1-39% stenosis.               Non-hemodynamically significant plaque <50% noted in the CCA. Vertebrals:  Bilateral vertebral arteries demonstrate antegrade flow. Subclavians: Right subclavian artery flow was disturbed. Normal flow              hemodynamics were seen in the left subclavian artery. *See table(s) above for measurements and observations. Suggest follow up study in 12 months. Electronically signed by Charlton Haws MD on 09/08/2022 at 12:35:08 PM.    Final     Assessment & Plan:   Problem List Items Addressed This Visit     HERPES LABIALIS    Acyclovir      Relevant Medications   acyclovir (ZOVIRAX) 200 MG capsule   Dyslipidemia - Primary    Doing OK on Livalo      DM II (diabetes  mellitus, type II), controlled (HCC)    On Metformin Check A1c      B12 deficiency    On B12      Low back pain    Norco prn  Potential benefits of a long term opioids use as well as potential risks (i.e. addiction risk, apnea etc) and  complications (i.e. Somnolence, constipation and others) were explained to the patient and were aknowledged.         Meds ordered this encounter  Medications   acyclovir (ZOVIRAX) 200 MG capsule    Sig: TAKE 1 CAPSULE(200 MG) BY MOUTH TWICE DAILYTAKE 1 CAPSULE(200 MG) BY MOUTH TWICE DAILY    Dispense:  60 capsule    Refill:  2      Follow-up: No follow-ups on file.  Sonda Primes, MD

## 2022-10-06 NOTE — Assessment & Plan Note (Signed)
Acyclovir 

## 2022-11-05 ENCOUNTER — Other Ambulatory Visit: Payer: Self-pay | Admitting: Internal Medicine

## 2022-11-30 ENCOUNTER — Telehealth: Payer: Self-pay | Admitting: Cardiology

## 2022-11-30 NOTE — Telephone Encounter (Signed)
Pt c/o swelling/edema: STAT if pt has developed SOB within 24 hours  If swelling, where is the swelling located? Feet and ankles are swollen  How much weight have you gained and in what time span?   Have you gained 2 pounds in a day or 5 pounds in a week?  no  Do you have a log of your daily weights (if so, list)?   Are you currently taking a fluid pill? no  Are you currently SOB? lno  Have you traveled recently in a car or plane for an extended period of time? No  She wants to know if patients needs a fluid pill?

## 2022-11-30 NOTE — Telephone Encounter (Signed)
Swelling to feet and ankles approx 3 days ago. No increased SOB. Does not resolve at night. He does sleep with pillow under feet. Tender to touch.  Non Pitting per wife description Weight 190 A few days ago 190. Also at last OV to PCP was 190.  Advised to continue to elevate feet as much as possible through the day and watch sodium intake.  Will send to provider for further recommendations

## 2022-12-01 NOTE — Telephone Encounter (Signed)
Spoke with pt wife, she reports his legs are not as swollen today as before. He does have compression hose and he was encouraged to wear those during the day.
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
## 2022-12-09 ENCOUNTER — Other Ambulatory Visit: Payer: Self-pay | Admitting: Internal Medicine

## 2022-12-17 ENCOUNTER — Ambulatory Visit: Payer: Medicare Other | Attending: Cardiology | Admitting: Cardiology

## 2022-12-17 ENCOUNTER — Encounter: Payer: Self-pay | Admitting: Cardiology

## 2022-12-17 VITALS — BP 122/66 | HR 80 | Ht 70.0 in | Wt 191.0 lb

## 2022-12-17 DIAGNOSIS — I10 Essential (primary) hypertension: Secondary | ICD-10-CM | POA: Diagnosis not present

## 2022-12-17 DIAGNOSIS — I2510 Atherosclerotic heart disease of native coronary artery without angina pectoris: Secondary | ICD-10-CM | POA: Insufficient documentation

## 2022-12-17 DIAGNOSIS — I350 Nonrheumatic aortic (valve) stenosis: Secondary | ICD-10-CM | POA: Insufficient documentation

## 2022-12-17 DIAGNOSIS — E7800 Pure hypercholesterolemia, unspecified: Secondary | ICD-10-CM | POA: Diagnosis not present

## 2022-12-17 DIAGNOSIS — I6523 Occlusion and stenosis of bilateral carotid arteries: Secondary | ICD-10-CM | POA: Insufficient documentation

## 2022-12-17 NOTE — Patient Instructions (Signed)
Medication Instructions:  None *If you need a refill on your cardiac medications before your next appointment, please call your pharmacy*   Lab Work: None If you have labs (blood work) drawn today and your tests are completely normal, you will receive your results only by: MyChart Message (if you have MyChart) OR A paper copy in the mail If you have any lab test that is abnormal or we need to change your treatment, we will call you to review the results.   Testing/Procedures: Echo    Follow-Up: At [facility], you and your health needs are our priority.  As part of our continuing mission to provide you with exceptional heart care, we have created designated Provider Care Teams.  These Care Teams include your primary Cardiologist (physician) and Advanced Practice Providers (APPs -  Physician Assistants and Nurse Practitioners) who all work together to provide you with the care you need, when you need it.  We recommend signing up for the patient portal called "MyChart".  Sign up information is provided on this After Visit Summary.  MyChart is used to connect with patients for Virtual Visits (Telemedicine).  Patients are able to view lab/test results, encounter notes, upcoming appointments, etc.  Non-urgent messages can be sent to your provider as well.   To learn more about what you can do with MyChart, go to [email or web address].    Your next appointment:   6 month(s)  Provider:   Olga Millers, MD     Other Instructions Your physician has requested that you have an echocardiogram. Echocardiography is a painless test that uses sound waves to create images of your heart. It provides your doctor with information about the size and shape of your heart and how well your heart's chambers and valves are working. This procedure takes approximately one hour. There are no restrictions for this procedure. Please do NOT wear cologne, perfume, aftershave, or lotions (deodorant is  allowed). Please arrive 15 minutes prior to your appointment time.

## 2022-12-28 ENCOUNTER — Other Ambulatory Visit: Payer: Self-pay | Admitting: Internal Medicine

## 2022-12-30 ENCOUNTER — Ambulatory Visit: Payer: Medicare Other

## 2022-12-30 VITALS — Ht 70.0 in | Wt 191.0 lb

## 2022-12-30 DIAGNOSIS — Z0000 Encounter for general adult medical examination without abnormal findings: Secondary | ICD-10-CM | POA: Diagnosis not present
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
## 2022-12-30 NOTE — Patient Instructions (Signed)
[name] , Thank you for taking time to come for your Medicare Wellness Visit. I appreciate your ongoing commitment to your health goals. Please review the following plan we discussed and let me know if I can assist you in the future.   Referrals/Orders/Follow-Ups/Clinician Recommendations: No  This is a list of the screening recommended for you and due dates:  Health Maintenance  Topic Date Due   Zoster (Shingles) Vaccine (1 of 2) Never done   Colon Cancer Screening  06/03/2021   Yearly kidney health urinalysis for diabetes  10/31/2021   Hemoglobin A1C  05/01/2022   Complete foot exam   06/12/2022   Eye exam for diabetics  07/17/2022   Flu Shot  10/01/2022   COVID-19 Vaccine (4 - 2023-24 season) 11/01/2022   Yearly kidney function blood test for diabetes  04/22/2023   Medicare Annual Wellness Visit  12/30/2023   DTaP/Tdap/Td vaccine (3 - Td or Tdap) 10/25/2029   Pneumonia Vaccine  Completed   HPV Vaccine  Aged Out    Advanced directives: (Declined) Advance directive discussed with you today. Even though you declined this today, please call our office should you change your mind, and we can give you the proper paperwork for you to fill out.  Next Medicare Annual Wellness Visit scheduled for next year: No

## 2023-01-06 ENCOUNTER — Ambulatory Visit: Payer: Medicare Other | Admitting: Internal Medicine

## 2023-01-06 ENCOUNTER — Encounter: Payer: Self-pay | Admitting: Internal Medicine

## 2023-01-06 VITALS — BP 110/70 | HR 65 | Temp 98.6°F | Ht 70.0 in | Wt 191.0 lb

## 2023-01-06 DIAGNOSIS — F329 Major depressive disorder, single episode, unspecified: Secondary | ICD-10-CM

## 2023-01-06 DIAGNOSIS — Z23 Encounter for immunization: Secondary | ICD-10-CM

## 2023-01-06 DIAGNOSIS — I350 Nonrheumatic aortic (valve) stenosis: Secondary | ICD-10-CM | POA: Diagnosis not present

## 2023-01-06 DIAGNOSIS — I10 Essential (primary) hypertension: Secondary | ICD-10-CM | POA: Diagnosis not present

## 2023-01-06 MED ORDER — TOLTERODINE TARTRATE 2 MG PO TABS: 2.0000 mg | ORAL_TABLET | Freq: Two times a day (BID) | ORAL | 5 refills | Status: DC

## 2023-01-06 NOTE — Assessment & Plan Note (Signed)
On Losartan, off HCTZ

## 2023-01-06 NOTE — Assessment & Plan Note (Addendum)
SOB, near-syncope F/u w/Dr Jens Som  "aortic stenosis-moderate on most recent echocardiogram. Plan follow-up study January 2025. He understands the symptoms to be aware of including worsening dyspnea on exertion, chest pain and syncope. He also understands he will likely require TAVR in the future. "

## 2023-01-06 NOTE — Assessment & Plan Note (Signed)
On Citalopram at HS

## 2023-01-06 NOTE — Progress Notes (Signed)
Subjective:  [name]: Andrew Malone., male    DOB: 1941/01/30  Age: 82 y.o. MRN: [id number]  CC: Medical Management of Chronic Issues (3 mnth f/u)   HPI [name] presents for CAD, HTN, HTN C/o frequency - peeing 4-16/night  Outpatient Medications Prior to Visit  Medication Sig Dispense Refill   acyclovir (ZOVIRAX) 200 MG capsule TAKE 1 CAPSULE(200 MG) BY MOUTH TWICE DAILYTAKE 1 CAPSULE(200 MG) BY MOUTH TWICE DAILY 60 capsule 2   amLODipine (NORVASC) 5 MG tablet Take 1 tablet (5 mg total) by mouth daily. 90 tablet 3   ammonium lactate (LAC-HYDRIN) 12 % lotion APPLY TO THE TO THE AFFECTED AREA DAILY 400 g 0   aspirin EC 81 MG tablet Take 1 tablet (81 mg total) by mouth daily. Swallow whole. 30 tablet 12   Cholecalciferol 1000 units tablet Take 1,000 Units by mouth daily.     citalopram (CELEXA) 10 MG tablet TAKE 1 TABLET(10 MG) BY MOUTH DAILY 90 tablet 3   clopidogrel (PLAVIX) 75 MG tablet TAKE 1 TABLET(75 MG) BY MOUTH DAILY 90 tablet 3   Cyanocobalamin (VITAMIN B-12) 1000 MCG SUBL Place 1 tablet (1,000 mcg total) under the tongue daily. 100 tablet 3   glucose blood (ONETOUCH VERIO) test strip check blood sugar In Vitro once a day DX: E11.42 for 90 days     Loperamide HCl (IMODIUM A-D PO) Take by mouth as needed.     metFORMIN (GLUCOPHAGE-XR) 500 MG 24 hr tablet Take 1,000 mg by mouth daily with supper.   3   metoprolol succinate (TOPROL-XL) 25 MG 24 hr tablet Take 1 tablet (25 mg total) by mouth daily. 90 tablet 3   nitroGLYCERIN (NITROSTAT) 0.4 MG SL tablet Place 1 tablet (0.4 mg total) under the tongue every 5 (five) minutes x 3 doses as needed for chest pain. 25 tablet 12   pantoprazole (PROTONIX) 40 MG tablet TAKE 1 TABLET BY MOUTH EVERY DAY 90 tablet 3   tamsulosin (FLOMAX) 0.4 MG CAPS capsule TAKE 1 CAPSULE(0.4 MG) BY MOUTH AT BEDTIME 90 capsule 3   ZYPITAMAG 4 MG TABS TAKE 1 TABLET BY MOUTH EVERY DAY 60 tablet 5   ezetimibe (ZETIA) 10 MG tablet Take 1 tablet (10  mg total) by mouth daily. 30 tablet 6   No facility-administered medications prior to visit.    ROS: Review of Systems  Constitutional:  Negative for appetite change, fatigue and unexpected weight change.  HENT:  Negative for congestion, nosebleeds, sneezing, sore throat and trouble swallowing.   Eyes:  Negative for itching and visual disturbance.  Respiratory:  Negative for cough.   Cardiovascular:  Negative for chest pain, palpitations and leg swelling.  Gastrointestinal:  Negative for abdominal distention, blood in stool, diarrhea and nausea.  Genitourinary:  Negative for frequency and hematuria.  Musculoskeletal:  Positive for arthralgias and back pain. Negative for gait problem, joint swelling and neck pain.  Skin:  Negative for rash.  Neurological:  Negative for dizziness, tremors, speech difficulty and weakness.  Psychiatric/Behavioral:  Negative for agitation, dysphoric mood, sleep disturbance and suicidal ideas. The patient is not nervous/anxious.     Objective:  BP 110/70 (BP Location: Left Arm, Patient Position: Sitting, Cuff Size: Normal)   Pulse 65   Temp 98.6 F (37 C) (Oral)   Ht 5\' 10"  (1.778 m)   Wt 191 lb (86.6 kg)   SpO2 99%   BMI 27.41 kg/m   BP Readings from Last 3 Encounters:  01/06/23  110/70  12/17/22 122/66  10/06/22 110/70    Wt Readings from Last 3 Encounters:  01/06/23 191 lb (86.6 kg)  12/30/22 191 lb (86.6 kg)  12/17/22 191 lb (86.6 kg)    Physical Exam Constitutional:      General: He is not in acute distress.    Appearance: He is well-developed. He is obese.     Comments: NAD  Eyes:     Conjunctiva/sclera: Conjunctivae normal.     Pupils: Pupils are equal, round, and reactive to light.  Neck:     Thyroid: No thyromegaly.     Vascular: No JVD.  Cardiovascular:     Rate and Rhythm: Normal rate and regular rhythm.     Heart sounds: Normal heart sounds. No murmur heard.    No friction rub. No gallop.  Pulmonary:     Effort:  Pulmonary effort is normal. No respiratory distress.     Breath sounds: Normal breath sounds. No wheezing or rales.  Chest:     Chest wall: No tenderness.  Abdominal:     General: Bowel sounds are normal. There is no distension.     Palpations: Abdomen is soft. There is no mass.     Tenderness: There is no abdominal tenderness. There is no guarding or rebound.  Musculoskeletal:        General: No tenderness. Normal range of motion.     Cervical back: Normal range of motion.     Right lower leg: No edema.     Left lower leg: No edema.  Lymphadenopathy:     Cervical: No cervical adenopathy.  Skin:    General: Skin is warm and dry.     Findings: No rash.  Neurological:     Mental Status: He is alert and oriented to person, place, and time.     Cranial Nerves: No cranial nerve deficit.     Motor: No abnormal muscle tone.     Coordination: Coordination normal.     Gait: Gait normal.     Deep Tendon Reflexes: Reflexes are normal and symmetric.  Psychiatric:        Behavior: Behavior normal.        Thought Content: Thought content normal.        Judgment: Judgment normal.     Lab Results  Component Value Date   WBC 11.5 (H) 04/21/2022   HGB 12.2 (L) 04/21/2022   HCT 37.9 04/21/2022   PLT 359 04/21/2022   GLUCOSE 141 (H) 04/21/2022   CHOL 121 08/31/2022   TRIG 120 08/31/2022   HDL 38 (L) 08/31/2022   LDLDIRECT 51.0 07/24/2016   LDLCALC 59 08/31/2022   ALT 18 08/31/2022   AST 15 08/31/2022   NA 140 04/21/2022   K 4.9 04/21/2022   CL 103 04/21/2022   CREATININE 1.33 (H) 04/21/2022   BUN 18 04/21/2022   CO2 25 04/21/2022   TSH 1.550 04/21/2022   PSA 1.75 07/24/2016   INR 1.0 07/13/2018   HGBA1C 7.0 (H) 10/31/2021   MICROALBUR <0.7 10/31/2020    VAS US CAROTID  Result Date: 09/08/2022 Carotid Arterial Duplex Study Patient Name:  Andrew WICHERT Sr.  Date of Exam:   09/08/2022 Medical Rec #: [id number]            Accession #:    [id number] Date of Birth: [birth year]             Patient Gender: M Patient Age:   53 years Exam Location:  [facility] Procedure:  VAS US CAROTID Referring Phys: BRIAN CRENSHAW --------------------------------------------------------------------------------  Indications:       Carotid artery disease, Right stent and patient denies any                    cerebrovascular symptoms. Risk Factors:      Hypertension, hyperlipidemia, Diabetes, no history of                    smoking. Comparison Study:  In 07/2021, a carotid artery duplex showed velocities of                    119/30 cm/s in the RICA, s/p stent placement and 126/25 cm/s                    in the LICA. Performing Technologist: Tyna Jaksch RVT  Examination Guidelines: A complete evaluation includes B-mode imaging, spectral Doppler, color Doppler, and power Doppler as needed of all accessible portions of each vessel. Bilateral testing is considered an integral part of a complete examination. Limited examinations for reoccurring indications may be performed as noted.  Right Carotid Findings: +--------+--------+--------+--------+------------------+--------+         PSV cm/sEDV cm/sStenosisPlaque DescriptionComments +--------+--------+--------+--------+------------------+--------+ CCA Prox89      12                                         +--------+--------+--------+--------+------------------+--------+ CCA Mid 80      15      <50%    heterogenous               +--------+--------+--------+--------+------------------+--------+ ECA     179     15              heterogenous               +--------+--------+--------+--------+------------------+--------+ +----------+--------+-------+---------+-------------------+           PSV cm/sEDV cmsDescribe Arm Pressure (mmHG) +----------+--------+-------+---------+-------------------+ [id number]            [id number]                 +----------+--------+-------+---------+-------------------+  +---------+--------+--+--------+-+---------+ VertebralPSV cm/s32EDV cm/s7Antegrade +---------+--------+--+--------+-+---------+  Right Stent(s): +------------------------+--------+--------+--------+--------+--------+ Distal CCA to Distal ICAPSV cm/sEDV cm/sStenosisWaveformComments +------------------------+--------+--------+--------+--------+--------+ Prox to Stent           68      12                               +------------------------+--------+--------+--------+--------+--------+ Proximal Stent          75      17                               +------------------------+--------+--------+--------+--------+--------+ Mid Stent               74      16                               +------------------------+--------+--------+--------+--------+--------+ Distal Stent            71      21                               +------------------------+--------+--------+--------+--------+--------+  Distal to Stent         63      18                      tortuous +------------------------+--------+--------+--------+--------+--------+ Widely patent right distal CCA to distal ICA stent without evidence of stenosis.  Left Carotid Findings: +----------+--------+--------+--------+--------------------------+--------+           PSV cm/sEDV cm/sStenosisPlaque Description        Comments +----------+--------+--------+--------+--------------------------+--------+ CCA Prox  95      12                                                 +----------+--------+--------+--------+--------------------------+--------+ CCA Distal79      17      <50%    heterogenous                       +----------+--------+--------+--------+--------------------------+--------+ ICA Prox  81      20              heterogenous and irregular         +----------+--------+--------+--------+--------------------------+--------+ ICA Mid   89      26      1-39%   heterogenous and irregular          +----------+--------+--------+--------+--------------------------+--------+ ICA Distal114     34                                        tortuous +----------+--------+--------+--------+--------------------------+--------+ ECA       147     15                                                 +----------+--------+--------+--------+--------------------------+--------+ +----------+--------+--------+----------------+-------------------+           PSV cm/sEDV cm/sDescribe        Arm Pressure (mmHG) +----------+--------+--------+----------------+-------------------+ [id number]              Multiphasic, [id number]                 +----------+--------+--------+----------------+-------------------+ +---------+--------+--+--------+--+---------+ VertebralPSV cm/s45EDV cm/s13Antegrade +---------+--------+--+--------+--+---------+   Summary: Right Carotid: Non-hemodynamically significant plaque <50% noted in the CCA.                Widely patent right distal CCA to distal ICA stent without                evidence of stenosis. Left Carotid: Velocities in the left ICA are consistent with a 1-39% stenosis.               Non-hemodynamically significant plaque <50% noted in the CCA. Vertebrals:  Bilateral vertebral arteries demonstrate antegrade flow. Subclavians: Right subclavian artery flow was disturbed. Normal flow              hemodynamics were seen in the left subclavian artery. *See table(s) above for measurements and observations. Suggest follow up study in 12 months. Electronically signed by Charlton Haws MD on 09/08/2022 at 12:35:08 PM.    Final     Assessment & Plan:   Problem List Items Addressed This Visit     Depression  On Citalopram at HS      Essential hypertension    On Losartan, off HCTZ      Aortic stenosis    SOB, near-syncope F/u w/Dr Jens Som  "aortic stenosis-moderate on most recent echocardiogram. Plan follow-up study January 2025. He understands the symptoms  to be aware of including worsening dyspnea on exertion, chest pain and syncope. He also understands he will likely require TAVR in the future. "      Other Visit Diagnoses     Need for influenza vaccination    -  Primary   Relevant Orders   Flu Vaccine Trivalent High Dose (Fluad) (Completed)         Meds ordered this encounter  Medications   tolterodine (DETROL) 2 MG tablet    Sig: Take 1 tablet (2 mg total) by mouth 2 (two) times daily.    Dispense:  60 tablet    Refill:  5      Follow-up: Return in about 3 months (around 04/08/2023) for a follow-up visit.  Sonda Primes, MD

## 2023-01-27 ENCOUNTER — Other Ambulatory Visit: Payer: Self-pay | Admitting: Cardiology

## 2023-01-27 DIAGNOSIS — E7800 Pure hypercholesterolemia, unspecified: Secondary | ICD-10-CM

## 2023-01-31 ENCOUNTER — Encounter: Payer: Self-pay | Admitting: Internal Medicine

## 2023-02-26 ENCOUNTER — Other Ambulatory Visit: Payer: Self-pay | Admitting: Internal Medicine

## 2023-03-04 ENCOUNTER — Ambulatory Visit (HOSPITAL_COMMUNITY): Payer: Medicare Other | Attending: Cardiology

## 2023-03-04 DIAGNOSIS — I350 Nonrheumatic aortic (valve) stenosis: Secondary | ICD-10-CM | POA: Diagnosis not present

## 2023-03-04 LAB — ECHOCARDIOGRAM COMPLETE
AR max vel: 1.29 cm2
AV Area VTI: 1.41 cm2
AV Area mean vel: 1.46 cm2
AV Mean grad: 28 mm[Hg]
AV Peak grad: 56.9 mm[Hg]
Ao pk vel: 3.77 m/s
Area-P 1/2: 2.42 cm2
S' Lateral: 2.7 cm

## 2023-03-20 ENCOUNTER — Other Ambulatory Visit: Payer: Self-pay | Admitting: Cardiology

## 2023-04-05 DIAGNOSIS — L738 Other specified follicular disorders: Secondary | ICD-10-CM | POA: Diagnosis not present

## 2023-04-05 DIAGNOSIS — L570 Actinic keratosis: Secondary | ICD-10-CM | POA: Diagnosis not present

## 2023-04-05 DIAGNOSIS — X32XXXD Exposure to sunlight, subsequent encounter: Secondary | ICD-10-CM | POA: Diagnosis not present

## 2023-04-05 DIAGNOSIS — C44319 Basal cell carcinoma of skin of other parts of face: Secondary | ICD-10-CM | POA: Diagnosis not present

## 2023-04-08 ENCOUNTER — Encounter: Payer: Self-pay | Admitting: Internal Medicine

## 2023-04-08 ENCOUNTER — Ambulatory Visit (INDEPENDENT_AMBULATORY_CARE_PROVIDER_SITE_OTHER): Payer: Medicare Other | Admitting: Internal Medicine

## 2023-04-08 VITALS — BP 110/60 | HR 79 | Temp 98.2°F | Ht 70.0 in | Wt 191.0 lb

## 2023-04-08 DIAGNOSIS — I10 Essential (primary) hypertension: Secondary | ICD-10-CM | POA: Diagnosis not present

## 2023-04-08 DIAGNOSIS — J019 Acute sinusitis, unspecified: Secondary | ICD-10-CM | POA: Insufficient documentation

## 2023-04-08 DIAGNOSIS — E1159 Type 2 diabetes mellitus with other circulatory complications: Secondary | ICD-10-CM | POA: Diagnosis not present

## 2023-04-08 DIAGNOSIS — Z7984 Long term (current) use of oral hypoglycemic drugs: Secondary | ICD-10-CM

## 2023-04-08 DIAGNOSIS — N183 Chronic kidney disease, stage 3 unspecified: Secondary | ICD-10-CM | POA: Diagnosis not present

## 2023-04-08 DIAGNOSIS — E538 Deficiency of other specified B group vitamins: Secondary | ICD-10-CM | POA: Diagnosis not present

## 2023-04-08 DIAGNOSIS — J0100 Acute maxillary sinusitis, unspecified: Secondary | ICD-10-CM | POA: Diagnosis not present

## 2023-04-08 LAB — MICROALBUMIN / CREATININE URINE RATIO
Creatinine,U: 242.4 mg/dL
Microalb Creat Ratio: 6.3 mg/g (ref 0.0–30.0)
Microalb, Ur: 15.2 mg/dL — ABNORMAL HIGH (ref 0.0–1.9)

## 2023-04-08 LAB — COMPREHENSIVE METABOLIC PANEL
ALT: 21 U/L (ref 0–53)
AST: 17 U/L (ref 0–37)
Albumin: 4.4 g/dL (ref 3.5–5.2)
Alkaline Phosphatase: 65 U/L (ref 39–117)
BUN: 26 mg/dL — ABNORMAL HIGH (ref 6–23)
CO2: 28 meq/L (ref 19–32)
Calcium: 9.7 mg/dL (ref 8.4–10.5)
Chloride: 103 meq/L (ref 96–112)
Creatinine, Ser: 1.59 mg/dL — ABNORMAL HIGH (ref 0.40–1.50)
GFR: 40.74 mL/min — ABNORMAL LOW (ref >60.00)
Glucose, Bld: 237 mg/dL — ABNORMAL HIGH (ref 70–99)
Potassium: 4.8 meq/L (ref 3.5–5.1)
Sodium: 139 meq/L (ref 135–145)
Total Bilirubin: 0.6 mg/dL (ref 0.2–1.2)
Total Protein: 7.2 g/dL (ref 6.0–8.3)

## 2023-04-08 LAB — HEMOGLOBIN A1C: Hgb A1c MFr Bld: 7.7 % — ABNORMAL HIGH (ref 4.6–6.5)

## 2023-04-08 MED ORDER — CEFDINIR 300 MG PO CAPS: 300.0000 mg | ORAL_CAPSULE | Freq: Two times a day (BID) | ORAL | 0 refills | Status: DC

## 2023-04-08 NOTE — Assessment & Plan Note (Signed)
On Losartan, off HCTZ

## 2023-04-08 NOTE — Assessment & Plan Note (Signed)
 On B12

## 2023-04-08 NOTE — Progress Notes (Signed)
 Subjective:  [name]: Andrew FORBES Koleen Chrystal., male    DOB: 1940/05/31  Age: 83 y.o. MRN: [id number]  CC: Medical Management of Chronic Issues (3 mnth f/u, Pt has been having a cough x2 weeks a/w dark yellow sputum, pt been having what seems to be fevers and saw specks of blood in sputum yesterday.)   HPI Andrew FORBES Koleen Sr. presents for cough x 1- weeks, yellow spitum; c/o R HA x 2 weeks, bad smell  Outpatient Medications Prior to Visit  Medication Sig Dispense Refill   acyclovir  (ZOVIRAX ) 200 MG capsule TAKE 1 CAPSULE BY MOUTH TWICE DAILY 60 capsule 2   amLODipine  (NORVASC ) 5 MG tablet TAKE 1 TABLET(5 MG) BY MOUTH DAILY 90 tablet 3   ammonium lactate (LAC-HYDRIN) 12 % lotion APPLY TO THE TO THE AFFECTED AREA DAILY 400 g 0   aspirin  EC 81 MG tablet Take 1 tablet (81 mg total) by mouth daily. Swallow whole. 30 tablet 12   Cholecalciferol  1000 units tablet Take 1,000 Units by mouth daily.     citalopram  (CELEXA ) 10 MG tablet TAKE 1 TABLET(10 MG) BY MOUTH DAILY 90 tablet 3   clopidogrel  (PLAVIX ) 75 MG tablet TAKE 1 TABLET(75 MG) BY MOUTH DAILY 90 tablet 3   Cyanocobalamin  (VITAMIN B-12) 1000 MCG SUBL Place 1 tablet (1,000 mcg total) under the tongue daily. 100 tablet 3   ezetimibe  (ZETIA ) 10 MG tablet TAKE 1 TABLET(10 MG) BY MOUTH DAILY 30 tablet 2   glucose blood (ONETOUCH VERIO) test strip check blood sugar In Vitro once a day DX: E11.42 for 90 days     Loperamide HCl (IMODIUM A-D PO) Take by mouth as needed.     metFORMIN (GLUCOPHAGE-XR) 500 MG 24 hr tablet Take 1,000 mg by mouth daily with supper.   3   metoprolol  succinate (TOPROL -XL) 25 MG 24 hr tablet Take 1 tablet (25 mg total) by mouth daily. 90 tablet 3   nitroGLYCERIN  (NITROSTAT ) 0.4 MG SL tablet Place 1 tablet (0.4 mg total) under the tongue every 5 (five) minutes x 3 doses as needed for chest pain. 25 tablet 12   pantoprazole  (PROTONIX ) 40 MG tablet TAKE 1 TABLET BY MOUTH EVERY DAY 90 tablet 3   tamsulosin  (FLOMAX ) 0.4 MG CAPS  capsule TAKE 1 CAPSULE(0.4 MG) BY MOUTH AT BEDTIME 90 capsule 3   tolterodine  (DETROL ) 2 MG tablet Take 1 tablet (2 mg total) by mouth 2 (two) times daily. 60 tablet 5   ZYPITAMAG  4 MG TABS TAKE 1 TABLET BY MOUTH EVERY DAY 60 tablet 5   No facility-administered medications prior to visit.    ROS: Review of Systems  Constitutional:  Negative for appetite change, fatigue and unexpected weight change.  HENT:  Positive for postnasal drip, rhinorrhea, sinus pressure and sinus pain. Negative for congestion, nosebleeds, sneezing, sore throat and trouble swallowing.   Eyes:  Negative for itching and visual disturbance.  Respiratory:  Positive for cough. Negative for wheezing.   Cardiovascular:  Negative for chest pain, palpitations and leg swelling.  Gastrointestinal:  Negative for abdominal distention, blood in stool, diarrhea and nausea.  Genitourinary:  Negative for frequency and hematuria.  Musculoskeletal:  Negative for back pain, gait problem, joint swelling and neck pain.  Skin:  Negative for rash.  Neurological:  Negative for dizziness, tremors, speech difficulty and weakness.  Psychiatric/Behavioral:  Negative for agitation, dysphoric mood and sleep disturbance. The patient is not nervous/anxious.     Objective:  BP 110/60 (BP Location: Left Arm,  Patient Position: Sitting, Cuff Size: Normal)   Pulse 79   Temp 98.2 F (36.8 C) (Oral)   Ht 5' 10 (1.778 m)   Wt 191 lb (86.6 kg)   SpO2 95%   BMI 27.41 kg/m   BP Readings from Last 3 Encounters:  04/08/23 110/60  01/06/23 110/70  12/17/22 122/66    Wt Readings from Last 3 Encounters:  04/08/23 191 lb (86.6 kg)  01/06/23 191 lb (86.6 kg)  12/30/22 191 lb (86.6 kg)    Physical Exam Constitutional:      General: He is not in acute distress.    Appearance: He is well-developed.     Comments: NAD  Eyes:     Conjunctiva/sclera: Conjunctivae normal.     Pupils: Pupils are equal, round, and reactive to light.  Neck:      Thyroid : No thyromegaly.     Vascular: No JVD.  Cardiovascular:     Rate and Rhythm: Normal rate and regular rhythm.     Heart sounds: Normal heart sounds. No murmur heard.    No friction rub. No gallop.  Pulmonary:     Effort: Pulmonary effort is normal. No respiratory distress.     Breath sounds: Normal breath sounds. No wheezing or rales.  Chest:     Chest wall: No tenderness.  Abdominal:     General: Bowel sounds are normal. There is no distension.     Palpations: Abdomen is soft. There is no mass.     Tenderness: There is no abdominal tenderness. There is no guarding or rebound.  Musculoskeletal:        General: No tenderness. Normal range of motion.     Cervical back: Normal range of motion.  Lymphadenopathy:     Cervical: No cervical adenopathy.  Skin:    General: Skin is warm and dry.     Findings: No rash.  Neurological:     Mental Status: He is alert and oriented to person, place, and time.     Cranial Nerves: No cranial nerve deficit.     Motor: No abnormal muscle tone.     Coordination: Coordination normal.     Gait: Gait normal.     Deep Tendon Reflexes: Reflexes are normal and symmetric.  Psychiatric:        Behavior: Behavior normal.        Thought Content: Thought content normal.        Judgment: Judgment normal.     Lab Results  Component Value Date   WBC 11.5 (H) 04/21/2022   HGB 12.2 (L) 04/21/2022   HCT 37.9 04/21/2022   PLT 359 04/21/2022   GLUCOSE 141 (H) 04/21/2022   CHOL 121 08/31/2022   TRIG 120 08/31/2022   HDL 38 (L) 08/31/2022   LDLDIRECT 51.0 07/24/2016   LDLCALC 59 08/31/2022   ALT 18 08/31/2022   AST 15 08/31/2022   NA 140 04/21/2022   K 4.9 04/21/2022   CL 103 04/21/2022   CREATININE 1.33 (H) 04/21/2022   BUN 18 04/21/2022   CO2 25 04/21/2022   TSH 1.550 04/21/2022   PSA 1.75 07/24/2016   INR 1.0 07/13/2018   HGBA1C 7.0 (H) 10/31/2021   MICROALBUR <0.7 10/31/2020    VAS US  CAROTID Result Date: 09/08/2022 Carotid Arterial  Duplex Study Patient Name:  Andrew LOW Sr.  Date of Exam:   09/08/2022 Medical Rec #: [id number]            Accession #:    [id number]  Date of Birth: Jun 22, 1940            Patient Gender: M Patient Age:   54 years Exam Location:  [facility] Procedure:      VAS US  CAROTID Referring Phys: BRIAN CRENSHAW --------------------------------------------------------------------------------  Indications:       Carotid artery disease, Right stent and patient denies any                    cerebrovascular symptoms. Risk Factors:      Hypertension, hyperlipidemia, Diabetes, no history of                    smoking. Comparison Study:  In 07/2021, a carotid artery duplex showed velocities of                    119/30 cm/s in the RICA, s/p stent placement and 126/25 cm/s                    in the LICA. Performing Technologist: Nanetta Shad RVT  Examination Guidelines: A complete evaluation includes B-mode imaging, spectral Doppler, color Doppler, and power Doppler as needed of all accessible portions of each vessel. Bilateral testing is considered an integral part of a complete examination. Limited examinations for reoccurring indications may be performed as noted.  Right Carotid Findings: +--------+--------+--------+--------+------------------+--------+         PSV cm/sEDV cm/sStenosisPlaque DescriptionComments +--------+--------+--------+--------+------------------+--------+ CCA Prox89      12                                         +--------+--------+--------+--------+------------------+--------+ CCA Mid 80      15      <50%    heterogenous               +--------+--------+--------+--------+------------------+--------+ ECA     179     15              heterogenous               +--------+--------+--------+--------+------------------+--------+ +----------+--------+-------+---------+-------------------+           PSV cm/sEDV cmsDescribe Arm Pressure (mmHG)  +----------+--------+-------+---------+-------------------+ [id number]            [id number]                 +----------+--------+-------+---------+-------------------+ +---------+--------+--+--------+-+---------+ VertebralPSV cm/s32EDV cm/s7Antegrade +---------+--------+--+--------+-+---------+  Right Stent(s): +------------------------+--------+--------+--------+--------+--------+ Distal CCA to Distal ICAPSV cm/sEDV cm/sStenosisWaveformComments +------------------------+--------+--------+--------+--------+--------+ Prox to Stent           68      12                               +------------------------+--------+--------+--------+--------+--------+ Proximal Stent          75      17                               +------------------------+--------+--------+--------+--------+--------+ Mid Stent               74      16                               +------------------------+--------+--------+--------+--------+--------+ Distal Stent  71      21                               +------------------------+--------+--------+--------+--------+--------+ Distal to Stent         63      18                      tortuous +------------------------+--------+--------+--------+--------+--------+ Widely patent right distal CCA to distal ICA stent without evidence of stenosis.  Left Carotid Findings: +----------+--------+--------+--------+--------------------------+--------+           PSV cm/sEDV cm/sStenosisPlaque Description        Comments +----------+--------+--------+--------+--------------------------+--------+ CCA Prox  95      12                                                 +----------+--------+--------+--------+--------------------------+--------+ CCA Distal79      17      <50%    heterogenous                       +----------+--------+--------+--------+--------------------------+--------+ ICA Prox  81      20               heterogenous and irregular         +----------+--------+--------+--------+--------------------------+--------+ ICA Mid   89      26      1-39%   heterogenous and irregular         +----------+--------+--------+--------+--------------------------+--------+ ICA Distal114     34                                        tortuous +----------+--------+--------+--------+--------------------------+--------+ ECA       147     15                                                 +----------+--------+--------+--------+--------------------------+--------+ +----------+--------+--------+----------------+-------------------+           PSV cm/sEDV cm/sDescribe        Arm Pressure (mmHG) +----------+--------+--------+----------------+-------------------+ [id number]              Multiphasic, [id number]                 +----------+--------+--------+----------------+-------------------+ +---------+--------+--+--------+--+---------+ VertebralPSV cm/s45EDV cm/s13Antegrade +---------+--------+--+--------+--+---------+   Summary: Right Carotid: Non-hemodynamically significant plaque <50% noted in the CCA.                Widely patent right distal CCA to distal ICA stent without                evidence of stenosis. Left Carotid: Velocities in the left ICA are consistent with a 1-39% stenosis.               Non-hemodynamically significant plaque <50% noted in the CCA. Vertebrals:  Bilateral vertebral arteries demonstrate antegrade flow. Subclavians: Right subclavian artery flow was disturbed. Normal flow              hemodynamics were seen in the left subclavian artery. *See table(s) above for measurements and observations. Suggest follow up study  in 12 months. Electronically signed by Maude Emmer MD on 09/08/2022 at 12:35:08 PM.    Final     Assessment & Plan:   Problem List Items Addressed This Visit     Essential hypertension   On Losartan , off HCTZ      DM II (diabetes mellitus, type II),  controlled (HCC)   On Metformin Check A1c      Relevant Orders   POCT Urine microalbumin-creatinine with uACR   Hemoglobin A1c   Comprehensive metabolic panel   Microalbumin / creatinine urine ratio   B12 deficiency   On B12      CRI (chronic renal insufficiency), stage 3 (moderate) (HCC)   Hydrate well Off HCTZ      Relevant Orders   Hemoglobin A1c   Comprehensive metabolic panel   Acute sinusitis - Primary   New Omnicef  x 10 d      Relevant Medications   cefdinir  (OMNICEF ) 300 MG capsule      Meds ordered this encounter  Medications   cefdinir  (OMNICEF ) 300 MG capsule    Sig: Take 1 capsule (300 mg total) by mouth 2 (two) times daily.    Dispense:  20 capsule    Refill:  0      Follow-up: Return for a follow-up visit.  Marolyn Noel, MD

## 2023-04-08 NOTE — Assessment & Plan Note (Signed)
On Metformin.  Check A1c 

## 2023-04-08 NOTE — Assessment & Plan Note (Signed)
New Omnicef x 10 d  

## 2023-04-08 NOTE — Assessment & Plan Note (Signed)
Hydrate well Off HCTZ

## 2023-04-19 ENCOUNTER — Other Ambulatory Visit: Payer: Self-pay | Admitting: Cardiology

## 2023-04-19 DIAGNOSIS — E7800 Pure hypercholesterolemia, unspecified: Secondary | ICD-10-CM

## 2023-04-30 ENCOUNTER — Other Ambulatory Visit: Payer: Self-pay | Admitting: Internal Medicine

## 2023-05-17 DIAGNOSIS — L570 Actinic keratosis: Secondary | ICD-10-CM | POA: Diagnosis not present

## 2023-05-17 DIAGNOSIS — X32XXXD Exposure to sunlight, subsequent encounter: Secondary | ICD-10-CM | POA: Diagnosis not present

## 2023-05-17 DIAGNOSIS — Z08 Encounter for follow-up examination after completed treatment for malignant neoplasm: Secondary | ICD-10-CM | POA: Diagnosis not present

## 2023-05-17 DIAGNOSIS — Z85828 Personal history of other malignant neoplasm of skin: Secondary | ICD-10-CM | POA: Diagnosis not present

## 2023-05-24 DIAGNOSIS — R5383 Other fatigue: Secondary | ICD-10-CM | POA: Diagnosis not present

## 2023-05-24 DIAGNOSIS — E1142 Type 2 diabetes mellitus with diabetic polyneuropathy: Secondary | ICD-10-CM | POA: Diagnosis not present

## 2023-05-24 DIAGNOSIS — N1831 Chronic kidney disease, stage 3a: Secondary | ICD-10-CM | POA: Diagnosis not present

## 2023-05-24 DIAGNOSIS — Z8639 Personal history of other endocrine, nutritional and metabolic disease: Secondary | ICD-10-CM | POA: Diagnosis not present

## 2023-05-24 DIAGNOSIS — Z7984 Long term (current) use of oral hypoglycemic drugs: Secondary | ICD-10-CM | POA: Diagnosis not present

## 2023-05-24 DIAGNOSIS — Z87448 Personal history of other diseases of urinary system: Secondary | ICD-10-CM | POA: Diagnosis not present

## 2023-05-24 DIAGNOSIS — I10 Essential (primary) hypertension: Secondary | ICD-10-CM | POA: Diagnosis not present

## 2023-05-26 ENCOUNTER — Other Ambulatory Visit: Payer: Self-pay | Admitting: Internal Medicine

## 2023-05-27 ENCOUNTER — Other Ambulatory Visit: Payer: Self-pay | Admitting: Nurse Practitioner
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
## 2023-06-22 ENCOUNTER — Ambulatory Visit: Payer: Medicare Other | Attending: Cardiology | Admitting: Cardiology

## 2023-06-22 ENCOUNTER — Encounter: Payer: Self-pay | Admitting: Cardiology

## 2023-06-22 VITALS — BP 130/74 | HR 73 | Ht 70.5 in | Wt 191.0 lb

## 2023-06-22 DIAGNOSIS — I10 Essential (primary) hypertension: Secondary | ICD-10-CM

## 2023-06-22 DIAGNOSIS — I2510 Atherosclerotic heart disease of native coronary artery without angina pectoris: Secondary | ICD-10-CM

## 2023-06-22 DIAGNOSIS — I350 Nonrheumatic aortic (valve) stenosis: Secondary | ICD-10-CM | POA: Diagnosis not present

## 2023-06-22 DIAGNOSIS — E7800 Pure hypercholesterolemia, unspecified: Secondary | ICD-10-CM

## 2023-06-22 NOTE — Patient Instructions (Signed)
   Testing/Procedures:  Your physician has requested that you have an echocardiogram. Echocardiography is a painless test that uses sound waves to create images of your heart. It provides your doctor with information about the size and shape of your heart and how well your heart's chambers and valves are working. This procedure takes approximately one hour. There are no restrictions for this procedure. Please do NOT wear cologne, perfume, aftershave, or lotions (deodorant is allowed). Please arrive 15 minutes prior to your appointment time.  Please note: We ask at that you not bring children with you during ultrasound (echo/ vascular) testing. Due to room size and safety concerns, children are not allowed in the ultrasound rooms during exams. Our front office staff cannot provide observation of children in our lobby area while testing is being conducted. An adult accompanying a patient to their appointment will only be allowed in the ultrasound room at the discretion of the ultrasound technician under special circumstances. We apologize for any inconvenience.   Follow-Up: At [facility], you and your health needs are our priority.  As part of our continuing mission to provide you with exceptional heart care, our providers are all part of one team.  This team includes your primary Cardiologist (physician) and Advanced Practice Providers or APPs (Physician Assistants and Nurse Practitioners) who all work together to provide you with the care you need, when you need it.  Your next appointment:   AFTER ECHO COMPLETE  Provider:   Alexandria Angel, MD     We recommend signing up for the patient portal called "MyChart".  Sign up information is provided on this After Visit Summary.  MyChart is used to connect with patients for Virtual Visits (Telemedicine).  Patients are able to view lab/test results, encounter notes, upcoming appointments, etc.  Non-urgent messages can be sent to your provider as  well.   To learn more about what you can do with MyChart, go to [email or web address].         1st Floor: - Lobby - Registration  - Pharmacy  - Lab - Cafe  2nd Floor: - PV Lab - Diagnostic Testing (echo, CT, nuclear med)  3rd Floor: - Vacant  4th Floor: - TCTS (cardiothoracic surgery) - AFib Clinic - Structural Heart Clinic - Vascular Surgery  - Vascular Ultrasound  5th Floor: - [facility] (general and EP) - Clinical Pharmacy for coumadin, hypertension, lipid, weight-loss medications, and med management appointments    Valet parking services will be available as well.

## 2023-06-23 ENCOUNTER — Telehealth: Payer: Self-pay | Admitting: Cardiology

## 2023-06-23 NOTE — Telephone Encounter (Signed)
 Wife called back to say this is the medication the patient is taking ZYPITAMAG 4 MG TABS . Please advise

## 2023-06-23 NOTE — Telephone Encounter (Signed)
 Patient identification verified by 2 forms. Andrew Lucky, RN    Called and spoke to patients wife [name] states:   -Dr. Audery Blazing wanted to know the name of the medication   -patient take Zypitamag 4 mg once daily  Informed [name] message sent  [name] has no further questions or concerns at this time

## 2023-07-08 ENCOUNTER — Encounter: Payer: Self-pay | Admitting: Internal Medicine

## 2023-07-08 ENCOUNTER — Ambulatory Visit: Payer: Medicare Other | Admitting: Internal Medicine

## 2023-07-08 VITALS — BP 110/58 | HR 63 | Temp 98.4°F | Ht 70.5 in | Wt 191.0 lb

## 2023-07-08 DIAGNOSIS — R55 Syncope and collapse: Secondary | ICD-10-CM

## 2023-07-08 DIAGNOSIS — E785 Hyperlipidemia, unspecified: Secondary | ICD-10-CM

## 2023-07-08 DIAGNOSIS — J209 Acute bronchitis, unspecified: Secondary | ICD-10-CM

## 2023-07-08 DIAGNOSIS — J0100 Acute maxillary sinusitis, unspecified: Secondary | ICD-10-CM

## 2023-07-08 DIAGNOSIS — I10 Essential (primary) hypertension: Secondary | ICD-10-CM | POA: Diagnosis not present

## 2023-07-08 MED ORDER — AMLODIPINE BESYLATE 5 MG PO TABS: 2.5000 mg | ORAL_TABLET | Freq: Every day | ORAL | Status: DC

## 2023-07-08 MED ORDER — CEFDINIR 300 MG PO CAPS: 300.0000 mg | ORAL_CAPSULE | Freq: Two times a day (BID) | ORAL | 0 refills | Status: DC

## 2023-07-08 MED ORDER — PREDNISONE 5 MG PO TABS: 5.0000 mg | ORAL_TABLET | Freq: Every day | ORAL | 1 refills | Status: DC

## 2023-07-08 NOTE — Assessment & Plan Note (Signed)
Reduce Norvasc to 2.5 mg/d

## 2023-07-08 NOTE — Assessment & Plan Note (Signed)
 Prednisone  5 mg per day x 10 d (can't tolerate high dose) Cefdinir 

## 2023-07-08 NOTE — Assessment & Plan Note (Signed)
Omnicef x 10 d

## 2023-07-08 NOTE — Progress Notes (Signed)
 Subjective:  [name]: Andrew Lash., male    DOB: 05-01-1940  Age: 83 y.o. MRN: [id number]  CC: Medical Management of Chronic Issues (3 month follow up)   HPI Andrew Lambing Sr. presents for sinusitis sx's, tired F/u on CAD, OA, GERD Low BP  Outpatient Medications Prior to Visit  Medication Sig Dispense Refill   acyclovir  (ZOVIRAX ) 200 MG capsule TAKE 1 CAPSULE BY MOUTH TWICE DAILY 60 capsule 2   ammonium lactate (LAC-HYDRIN) 12 % lotion APPLY TO THE TO THE AFFECTED AREA DAILY 400 g 0   aspirin  EC 81 MG tablet Take 1 tablet (81 mg total) by mouth daily. Swallow whole. 30 tablet 12   Cholecalciferol  1000 units tablet Take 1,000 Units by mouth daily.     citalopram  (CELEXA ) 10 MG tablet TAKE 1 TABLET(10 MG) BY MOUTH DAILY 90 tablet 3   clopidogrel  (PLAVIX ) 75 MG tablet TAKE 1 TABLET(75 MG) BY MOUTH DAILY 90 tablet 3   Cyanocobalamin  (VITAMIN B-12) 1000 MCG SUBL Place 1 tablet (1,000 mcg total) under the tongue daily. 100 tablet 3   ezetimibe  (ZETIA ) 10 MG tablet TAKE 1 TABLET(10 MG) BY MOUTH DAILY 90 tablet 2   glucose blood (ONETOUCH VERIO) test strip check blood sugar In Vitro once a day DX: E11.42 for 90 days     Loperamide HCl (IMODIUM A-D PO) Take by mouth as needed.     metFORMIN (GLUCOPHAGE-XR) 500 MG 24 hr tablet Take 1,000 mg by mouth daily with supper.   3   metoprolol  succinate (TOPROL -XL) 25 MG 24 hr tablet TAKE 1 TABLET(25 MG) BY MOUTH DAILY 90 tablet 1   pantoprazole  (PROTONIX ) 40 MG tablet TAKE 1 TABLET BY MOUTH EVERY DAY 90 tablet 3   tamsulosin  (FLOMAX ) 0.4 MG CAPS capsule TAKE 1 CAPSULE(0.4 MG) BY MOUTH AT BEDTIME 90 capsule 3   tolterodine  (DETROL ) 2 MG tablet Take 1 tablet (2 mg total) by mouth 2 (two) times daily. 60 tablet 5   ZYPITAMAG 4 MG TABS TAKE 1 TABLET BY MOUTH EVERY DAY 60 tablet 5   amLODipine  (NORVASC ) 5 MG tablet TAKE 1 TABLET(5 MG) BY MOUTH DAILY 90 tablet 3   cefdinir  (OMNICEF ) 300 MG capsule Take 1 capsule (300 mg total) by mouth 2 (two)  times daily. 20 capsule 0   nitroGLYCERIN  (NITROSTAT ) 0.4 MG SL tablet Place 1 tablet (0.4 mg total) under the tongue every 5 (five) minutes x 3 doses as needed for chest pain. (Patient not taking: Reported on 07/08/2023) 25 tablet 12   No facility-administered medications prior to visit.    ROS: Review of Systems  Constitutional:  Positive for fatigue. Negative for appetite change and unexpected weight change.  HENT:  Positive for congestion, postnasal drip and sinus pressure. Negative for nosebleeds, sneezing, sore throat and trouble swallowing.   Eyes:  Negative for itching and visual disturbance.  Respiratory:  Positive for cough.   Cardiovascular:  Negative for chest pain, palpitations and leg swelling.  Gastrointestinal:  Negative for abdominal distention, blood in stool, diarrhea and nausea.  Genitourinary:  Negative for frequency and hematuria.  Musculoskeletal:  Negative for back pain, gait problem, joint swelling and neck pain.  Skin:  Negative for rash.  Neurological:  Negative for dizziness, tremors, speech difficulty and weakness.  Psychiatric/Behavioral:  Negative for agitation, dysphoric mood and sleep disturbance. The patient is not nervous/anxious.     Objective:  BP (!) 110/58   Pulse 63   Temp 98.4 F (36.9 C)  Ht 5' 10.5" (1.791 m)   Wt 191 lb (86.6 kg)   SpO2 97%   BMI 27.02 kg/m   BP Readings from Last 3 Encounters:  07/08/23 (!) 110/58  06/22/23 130/74  04/08/23 110/60    Wt Readings from Last 3 Encounters:  07/08/23 191 lb (86.6 kg)  06/22/23 191 lb (86.6 kg)  04/08/23 191 lb (86.6 kg)    Physical Exam Constitutional:      General: He is not in acute distress.    Appearance: He is well-developed.     Comments: NAD  Eyes:     Conjunctiva/sclera: Conjunctivae normal.     Pupils: Pupils are equal, round, and reactive to light.  Neck:     Thyroid : No thyromegaly.     Vascular: No JVD.  Cardiovascular:     Rate and Rhythm: Normal rate and  regular rhythm.     Heart sounds: Normal heart sounds. No murmur heard.    No friction rub. No gallop.  Pulmonary:     Effort: Pulmonary effort is normal. No respiratory distress.     Breath sounds: Normal breath sounds. No wheezing or rales.  Chest:     Chest wall: No tenderness.  Abdominal:     General: Bowel sounds are normal. There is no distension.     Palpations: Abdomen is soft. There is no mass.     Tenderness: There is no abdominal tenderness. There is no guarding or rebound.  Musculoskeletal:        General: Tenderness present. Normal range of motion.     Cervical back: Normal range of motion.     Right lower leg: No edema.     Left lower leg: No edema.  Lymphadenopathy:     Cervical: No cervical adenopathy.  Skin:    General: Skin is warm and dry.     Findings: No rash.  Neurological:     Mental Status: He is alert and oriented to person, place, and time.     Cranial Nerves: No cranial nerve deficit.     Motor: No abnormal muscle tone.     Coordination: Coordination normal.     Gait: Gait normal.     Deep Tendon Reflexes: Reflexes are normal and symmetric.  Psychiatric:        Behavior: Behavior normal.        Thought Content: Thought content normal.        Judgment: Judgment normal.    Eryth nasal passages   Lab Results  Component Value Date   WBC 11.5 (H) 04/21/2022   HGB 12.2 (L) 04/21/2022   HCT 37.9 04/21/2022   PLT 359 04/21/2022   GLUCOSE 237 (H) 04/08/2023   CHOL 121 08/31/2022   TRIG 120 08/31/2022   HDL 38 (L) 08/31/2022   LDLDIRECT 51.0 07/24/2016   LDLCALC 59 08/31/2022   ALT 21 04/08/2023   AST 17 04/08/2023   NA 139 04/08/2023   K 4.8 04/08/2023   CL 103 04/08/2023   CREATININE 1.59 (H) 04/08/2023   BUN 26 (H) 04/08/2023   CO2 28 04/08/2023   TSH 1.550 04/21/2022   PSA 1.75 07/24/2016   INR 1.0 07/13/2018   HGBA1C 7.7 (H) 04/08/2023   MICROALBUR 15.2 (H) 04/08/2023    VAS US  CAROTID Result Date: 09/08/2022 Carotid Arterial  Duplex Study Patient Name:  Andrew KRIEL Sr.  Date of Exam:   09/08/2022 Medical Rec #: [id number]            Accession #:  [id number] Date of Birth: Jul 05, 1940            Patient Gender: M Patient Age:   66 years Exam Location:  [facility] Procedure:      VAS US  CAROTID Referring Phys: Polly Brink CRENSHAW --------------------------------------------------------------------------------  Indications:       Carotid artery disease, Right stent and patient denies any                    cerebrovascular symptoms. Risk Factors:      Hypertension, hyperlipidemia, Diabetes, no history of                    smoking. Comparison Study:  In 07/2021, a carotid artery duplex showed velocities of                    119/30 cm/s in the RICA, s/p stent placement and 126/25 cm/s                    in the LICA. Performing Technologist: Doren Gammons RVT  Examination Guidelines: A complete evaluation includes B-mode imaging, spectral Doppler, color Doppler, and power Doppler as needed of all accessible portions of each vessel. Bilateral testing is considered an integral part of a complete examination. Limited examinations for reoccurring indications may be performed as noted.  Right Carotid Findings: +--------+--------+--------+--------+------------------+--------+         PSV cm/sEDV cm/sStenosisPlaque DescriptionComments +--------+--------+--------+--------+------------------+--------+ CCA Prox89      12                                         +--------+--------+--------+--------+------------------+--------+ CCA Mid 80      15      <50%    heterogenous               +--------+--------+--------+--------+------------------+--------+ ECA     179     15              heterogenous               +--------+--------+--------+--------+------------------+--------+ +----------+--------+-------+---------+-------------------+           PSV cm/sEDV cmsDescribe Arm Pressure (mmHG)  +----------+--------+-------+---------+-------------------+ [id number]            Turbulent122                 +----------+--------+-------+---------+-------------------+ +---------+--------+--+--------+-+---------+ VertebralPSV cm/s32EDV cm/s7Antegrade +---------+--------+--+--------+-+---------+  Right Stent(s): +------------------------+--------+--------+--------+--------+--------+ Distal CCA to Distal ICAPSV cm/sEDV cm/sStenosisWaveformComments +------------------------+--------+--------+--------+--------+--------+ Prox to Stent           68      12                               +------------------------+--------+--------+--------+--------+--------+ Proximal Stent          75      17                               +------------------------+--------+--------+--------+--------+--------+ Mid Stent               74      16                               +------------------------+--------+--------+--------+--------+--------+ Distal Stent  71      21                               +------------------------+--------+--------+--------+--------+--------+ Distal to Stent         63      18                      tortuous +------------------------+--------+--------+--------+--------+--------+ Widely patent right distal CCA to distal ICA stent without evidence of stenosis.  Left Carotid Findings: +----------+--------+--------+--------+--------------------------+--------+           PSV cm/sEDV cm/sStenosisPlaque Description        Comments +----------+--------+--------+--------+--------------------------+--------+ CCA Prox  95      12                                                 +----------+--------+--------+--------+--------------------------+--------+ CCA Distal79      17      <50%    heterogenous                       +----------+--------+--------+--------+--------------------------+--------+ ICA Prox  81      20               heterogenous and irregular         +----------+--------+--------+--------+--------------------------+--------+ ICA Mid   89      26      1-39%   heterogenous and irregular         +----------+--------+--------+--------+--------------------------+--------+ ICA Distal114     34                                        tortuous +----------+--------+--------+--------+--------------------------+--------+ ECA       147     15                                                 +----------+--------+--------+--------+--------------------------+--------+ +----------+--------+--------+----------------+-------------------+           PSV cm/sEDV cm/sDescribe        Arm Pressure (mmHG) +----------+--------+--------+----------------+-------------------+ [id number]              Multiphasic, [id number]                 +----------+--------+--------+----------------+-------------------+ +---------+--------+--+--------+--+---------+ VertebralPSV cm/s45EDV cm/s13Antegrade +---------+--------+--+--------+--+---------+   Summary: Right Carotid: Non-hemodynamically significant plaque <50% noted in the CCA.                Widely patent right distal CCA to distal ICA stent without                evidence of stenosis. Left Carotid: Velocities in the left ICA are consistent with a 1-39% stenosis.               Non-hemodynamically significant plaque <50% noted in the CCA. Vertebrals:  Bilateral vertebral arteries demonstrate antegrade flow. Subclavians: Right subclavian artery flow was disturbed. Normal flow              hemodynamics were seen in the left subclavian artery. *See table(s) above for measurements and observations. Suggest follow up study  in 12 months. Electronically signed by Janelle Mediate MD on 09/08/2022 at 12:35:08 PM.    Final     Assessment & Plan:   Problem List Items Addressed This Visit     Dyslipidemia   Essential hypertension   Relevant Medications   amLODipine  (NORVASC ) 5 MG  tablet   Acute bronchitis   Prednisone  5 mg per day x 10 d (can't tolerate high dose) Cefdinir       Near syncope   Reduce Norvasc  to 2.5 mg/d      Relevant Medications   amLODipine  (NORVASC ) 5 MG tablet   Acute sinusitis - Primary   Omnicef  x 10 d      Relevant Medications   predniSONE  (DELTASONE ) 5 MG tablet   cefdinir  (OMNICEF ) 300 MG capsule      Meds ordered this encounter  Medications   amLODipine  (NORVASC ) 5 MG tablet    Sig: Take 0.5 tablets (2.5 mg total) by mouth daily.   predniSONE  (DELTASONE ) 5 MG tablet    Sig: Take 1 tablet (5 mg total) by mouth daily with breakfast.    Dispense:  15 tablet    Refill:  1   cefdinir  (OMNICEF ) 300 MG capsule    Sig: Take 1 capsule (300 mg total) by mouth 2 (two) times daily.    Dispense:  20 capsule    Refill:  0      Follow-up: Return in about 3 months (around 10/08/2023) for a follow-up visit.  Andrew Barn, MD
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
## 2023-07-22 ENCOUNTER — Ambulatory Visit (HOSPITAL_COMMUNITY)
Admission: RE | Admit: 2023-07-22 | Discharge: 2023-07-22 | Disposition: A | Discharge status: Home / Self Care | Source: Ambulatory Visit | Attending: Cardiovascular Disease | Admitting: Cardiovascular Disease

## 2023-07-22 DIAGNOSIS — I350 Nonrheumatic aortic (valve) stenosis: Secondary | ICD-10-CM | POA: Diagnosis not present

## 2023-07-23 ENCOUNTER — Ambulatory Visit: Payer: Self-pay | Admitting: Cardiology

## 2023-07-23 LAB — ECHOCARDIOGRAM COMPLETE
AR max vel: 1.13 cm2
AV Area VTI: 0.97 cm2
AV Area mean vel: 1.12 cm2
AV Mean grad: 33.4 mmHg
AV Peak grad: 54.8 mmHg
Ao pk vel: 3.7 m/s
Area-P 1/2: 2.78 cm2
P 1/2 time: 567 ms
S' Lateral: 2.6 cm

## 2023-08-03 ENCOUNTER — Ambulatory Visit: Attending: Cardiology | Admitting: Cardiology

## 2023-08-03 ENCOUNTER — Encounter: Payer: Self-pay | Admitting: Cardiology

## 2023-08-03 ENCOUNTER — Other Ambulatory Visit: Payer: Self-pay | Admitting: *Deleted

## 2023-08-03 VITALS — BP 104/64 | HR 74 | Ht 70.5 in | Wt 187.4 lb

## 2023-08-03 DIAGNOSIS — I350 Nonrheumatic aortic (valve) stenosis: Secondary | ICD-10-CM

## 2023-08-03 DIAGNOSIS — I10 Essential (primary) hypertension: Secondary | ICD-10-CM | POA: Diagnosis not present

## 2023-08-03 DIAGNOSIS — E7800 Pure hypercholesterolemia, unspecified: Secondary | ICD-10-CM | POA: Diagnosis not present

## 2023-08-03 DIAGNOSIS — I2510 Atherosclerotic heart disease of native coronary artery without angina pectoris: Secondary | ICD-10-CM | POA: Diagnosis not present

## 2023-08-03 NOTE — Patient Instructions (Signed)
 Medication Instructions:   NO CHANGE  *If you need a refill on your cardiac medications before your next appointment, please call your pharmacy*  Testing/Procedures:      Cardiac Catheterization   You are scheduled for a Cardiac Catheterization on Friday, June 6 with Dr. Antoinette Batman.  1. Please arrive at the [facility] (Main Entrance A) at [facility]: [address] [place], [place] [address] at 11:30 AM (This time is 2 hour(s) before your procedure to ensure your preparation).   Free valet parking service is available. You will check in at ADMITTING. The support person will be asked to wait in the waiting room.  It is OK to have someone drop you off and come back when you are ready to be discharged.        Special note: Every effort is made to have your procedure done on time. Please understand that emergencies sometimes delay scheduled procedures.  2. Diet: Do not eat solid foods after midnight.  You may have clear liquids until 5 AM the day of the procedure.  3. Labs: You will need to have blood drawn on TODAY  4. Medication instructions in preparation for your procedure:   DO NOT TAKE METFORMIN THE MORNING OF THE PROCEDURE AND 48 HOURS AFTER THE PROCEDURE  On the morning of your procedure, take Aspirin  81 mg and any morning medicines NOT listed above.  You may use sips of water.  5. Plan to go home the same day, you will only stay overnight if medically necessary. 6. You MUST have a responsible adult to drive you home. 7. An adult MUST be with you the first 24 hours after you arrive home. 8. Bring a current list of your medications, and the last time and date medication taken. 9. Bring ID and current insurance cards. 10.Please wear clothes that are easy to get on and off and wear slip-on shoes.  Thank you for allowing us  to care for you!   -- [facility]   Follow-Up: At [facility], you and your health needs  are our priority.  As part of our continuing mission to provide you with exceptional heart care, our providers are all part of one team.  This team includes your primary Cardiologist (physician) and Advanced Practice Providers or APPs (Physician Assistants and Nurse Practitioners) who all work together to provide you with the care you need, when you need it.  Your next appointment:   8-12 week(s)  Provider:   Alexandria Angel, MD

## 2023-08-04 ENCOUNTER — Ambulatory Visit: Payer: Self-pay | Admitting: Cardiology

## 2023-08-04 ENCOUNTER — Telehealth: Payer: Self-pay | Admitting: *Deleted

## 2023-08-04 DIAGNOSIS — I350 Nonrheumatic aortic (valve) stenosis: Secondary | ICD-10-CM

## 2023-08-04 DIAGNOSIS — N289 Disorder of kidney and ureter, unspecified: Secondary | ICD-10-CM

## 2023-08-04 LAB — BASIC METABOLIC PANEL WITH GFR
BUN/Creatinine Ratio: 15 (ref 10–24)
BUN: 25 mg/dL (ref 8–27)
CO2: 24 mmol/L (ref 20–29)
Calcium: 9.9 mg/dL (ref 8.6–10.2)
Chloride: 101 mmol/L (ref 96–106)
Creatinine, Ser: 1.65 mg/dL — ABNORMAL HIGH (ref 0.76–1.27)
Glucose: 228 mg/dL — ABNORMAL HIGH (ref 70–99)
Potassium: 4.6 mmol/L (ref 3.5–5.2)
Sodium: 139 mmol/L (ref 134–144)
eGFR: 42 mL/min/{1.73_m2} — ABNORMAL LOW (ref >59)

## 2023-08-04 LAB — CBC
Hematocrit: 38.5 % (ref 37.5–51.0)
Hemoglobin: 12.7 g/dL — ABNORMAL LOW (ref 13.0–17.7)
MCH: 30.3 pg (ref 26.6–33.0)
MCHC: 33 g/dL (ref 31.5–35.7)
MCV: 92 fL (ref 79–97)
Platelets: 226 10*3/uL (ref 150–450)
RBC: 4.19 x10E6/uL (ref 4.14–5.80)
RDW: 12.9 % (ref 11.6–15.4)
WBC: 7.1 10*3/uL (ref 3.4–10.8)

## 2023-08-04 NOTE — Telephone Encounter (Signed)
-----   Message from Alexandria Angel sent at 08/04/2023  4:32 AM EDT ----- No change in meds Hold metformin 2 days following cath Check bmet 2 days following cath [facility]

## 2023-08-04 NOTE — Telephone Encounter (Signed)
 Spoke with pt wife, aware of results. Encouraged patient to drink plenty of water or juice today and tomorrow morning prior to going to the hospital. Aware to have labs redrawn on Monday. Also aware they will get a call about a CT scan to check the calcium  score of the aortic valve. All questions about the procedure tomorrow answered.

## 2023-08-04 NOTE — Telephone Encounter (Signed)
 Cardiac Catheterization scheduled at [facility] for: Friday August 06, 2023 1:30 PM Arrival time [facility] Main Entrance A at: 8:30 AM-pre-procedure hydration  Nothing to eat after midnight prior to procedure, clear liquids until 5 AM day of procedure.  Medication instructions: -Hold:  Metformin-day of procedure and 48 hours post procedure -Other usual morning medications can be taken with sips of water including aspirin  81 mg and Plavix  75 mg.  Plan to go home the same day, you will only stay overnight if medically necessary.  You must have responsible adult to drive you home.  Someone must be with you the first 24 hours after you arrive home.  Reviewed procedure instructions with patient's wife (DPR), [name].

## 2023-08-06 ENCOUNTER — Encounter (HOSPITAL_COMMUNITY)
Admission: RE | Disposition: A | Discharge status: Home / Self Care | Payer: Self-pay | Source: Home / Self Care | Attending: Cardiovascular Disease

## 2023-08-06 ENCOUNTER — Other Ambulatory Visit: Payer: Self-pay

## 2023-08-06 ENCOUNTER — Ambulatory Visit (HOSPITAL_COMMUNITY)
Admission: RE | Admit: 2023-08-06 | Discharge: 2023-08-06 | Disposition: A | Discharge status: Home / Self Care | Attending: Cardiovascular Disease | Admitting: Cardiovascular Disease

## 2023-08-06 DIAGNOSIS — I350 Nonrheumatic aortic (valve) stenosis: Secondary | ICD-10-CM | POA: Diagnosis not present

## 2023-08-06 DIAGNOSIS — Z79899 Other long term (current) drug therapy: Secondary | ICD-10-CM | POA: Insufficient documentation

## 2023-08-06 DIAGNOSIS — Z8673 Personal history of transient ischemic attack (TIA), and cerebral infarction without residual deficits: Secondary | ICD-10-CM | POA: Diagnosis not present

## 2023-08-06 DIAGNOSIS — Z87891 Personal history of nicotine dependence: Secondary | ICD-10-CM | POA: Insufficient documentation

## 2023-08-06 DIAGNOSIS — Z7982 Long term (current) use of aspirin: Secondary | ICD-10-CM | POA: Insufficient documentation

## 2023-08-06 DIAGNOSIS — I10 Essential (primary) hypertension: Secondary | ICD-10-CM | POA: Insufficient documentation

## 2023-08-06 DIAGNOSIS — I2510 Atherosclerotic heart disease of native coronary artery without angina pectoris: Secondary | ICD-10-CM

## 2023-08-06 DIAGNOSIS — E785 Hyperlipidemia, unspecified: Secondary | ICD-10-CM | POA: Diagnosis not present

## 2023-08-06 HISTORY — PX: RIGHT/LEFT HEART CATH AND CORONARY ANGIOGRAPHY: CATH118266

## 2023-08-06 LAB — POCT I-STAT 7, (LYTES, BLD GAS, ICA,H+H)
Acid-Base Excess: 0 mmol/L (ref 0.0–2.0)
Bicarbonate: 24.8 mmol/L (ref 20.0–28.0)
Calcium, Ion: 1.28 mmol/L (ref 1.15–1.40)
HCT: 33 % — ABNORMAL LOW (ref 39.0–52.0)
Hemoglobin: 11.2 g/dL — ABNORMAL LOW (ref 13.0–17.0)
O2 Saturation: 94 %
Potassium: 4.4 mmol/L (ref 3.5–5.1)
Sodium: 140 mmol/L (ref 135–145)
TCO2: 26 mmol/L (ref 22–32)
pCO2 arterial: 41.5 mmHg (ref 32–48)
pH, Arterial: 7.384 (ref 7.35–7.45)
pO2, Arterial: 74 mmHg — ABNORMAL LOW (ref 83–108)

## 2023-08-06 LAB — POCT I-STAT EG7
Acid-Base Excess: 1 mmol/L (ref 0.0–2.0)
Bicarbonate: 26.8 mmol/L (ref 20.0–28.0)
Calcium, Ion: 1.28 mmol/L (ref 1.15–1.40)
HCT: 33 % — ABNORMAL LOW (ref 39.0–52.0)
Hemoglobin: 11.2 g/dL — ABNORMAL LOW (ref 13.0–17.0)
O2 Saturation: 69 %
Potassium: 4.4 mmol/L (ref 3.5–5.1)
Sodium: 141 mmol/L (ref 135–145)
TCO2: 28 mmol/L (ref 22–32)
pCO2, Ven: 47.3 mmHg (ref 44–60)
pH, Ven: 7.362 (ref 7.25–7.43)
pO2, Ven: 38 mmHg (ref 32–45)

## 2023-08-06 SURGERY — RIGHT/LEFT HEART CATH AND CORONARY ANGIOGRAPHY
Anesthesia: LOCAL

## 2023-08-06 MED ORDER — FENTANYL CITRATE (PF) 100 MCG/2ML IJ SOLN
INTRAMUSCULAR | Status: AC
  Filled 2023-08-06: qty 2

## 2023-08-06 MED ORDER — HEPARIN SODIUM (PORCINE) 1000 UNIT/ML IJ SOLN
INTRAMUSCULAR | Status: AC
Start: 2023-08-06 — End: ?
  Filled 2023-08-06: qty 10

## 2023-08-06 MED ORDER — VERAPAMIL HCL 2.5 MG/ML IV SOLN
INTRAVENOUS | Status: AC
Start: 2023-08-06 — End: ?
  Filled 2023-08-06: qty 2

## 2023-08-06 MED ORDER — LIDOCAINE HCL (PF) 1 % IJ SOLN
INTRAMUSCULAR | Status: DC | PRN
  Administered 2023-08-06 (×2): 2 mL via INTRADERMAL

## 2023-08-06 MED ORDER — ASPIRIN 81 MG PO CHEW: 81.0000 mg | CHEWABLE_TABLET | ORAL | Status: DC

## 2023-08-06 MED ORDER — IOHEXOL 350 MG/ML SOLN
INTRAVENOUS | Status: DC | PRN
Start: 2023-08-06 — End: 2023-08-06
  Administered 2023-08-06: 30 mL

## 2023-08-06 MED ORDER — HEPARIN (PORCINE) IN NACL 1000-0.9 UT/500ML-% IV SOLN
INTRAVENOUS | Status: DC | PRN
  Administered 2023-08-06 (×2): 500 mL

## 2023-08-06 MED ORDER — HEPARIN SODIUM (PORCINE) 1000 UNIT/ML IJ SOLN
INTRAMUSCULAR | Status: DC | PRN
  Administered 2023-08-06: 4000 [IU] via INTRAVENOUS

## 2023-08-06 MED ORDER — SODIUM CHLORIDE 0.9 % WEIGHT BASED INFUSION: 1.0000 mL/kg/h | INTRAVENOUS | Status: DC

## 2023-08-06 MED ORDER — FENTANYL CITRATE (PF) 100 MCG/2ML IJ SOLN
INTRAMUSCULAR | Status: DC | PRN
  Administered 2023-08-06: 25 ug via INTRAVENOUS

## 2023-08-06 MED ORDER — MIDAZOLAM HCL 2 MG/2ML IJ SOLN
INTRAMUSCULAR | Status: AC
  Filled 2023-08-06: qty 2

## 2023-08-06 MED ORDER — LIDOCAINE HCL (PF) 1 % IJ SOLN
INTRAMUSCULAR | Status: AC
Start: 2023-08-06 — End: ?
  Filled 2023-08-06: qty 30

## 2023-08-06 MED ORDER — MIDAZOLAM HCL 2 MG/2ML IJ SOLN
INTRAMUSCULAR | Status: DC | PRN
  Administered 2023-08-06: 1 mg via INTRAVENOUS

## 2023-08-06 MED ORDER — SODIUM CHLORIDE 0.9 % WEIGHT BASED INFUSION
3.0000 mL/kg/h | INTRAVENOUS | Status: AC
  Administered 2023-08-06: 3 mL/kg/h via INTRAVENOUS

## 2023-08-06 SURGICAL SUPPLY — 12 items
CATH 5FR JL3.5 JR4 ANG PIG MP (CATHETERS) IMPLANT
CATH BALLN WEDGE 5F 110CM (CATHETERS) IMPLANT
CATH INFINITI 5FR AL1 (CATHETERS) IMPLANT
DEVICE RAD COMP TR BAND LRG (VASCULAR PRODUCTS) IMPLANT
GLIDESHEATH SLEND SS 6F .021 (SHEATH) IMPLANT
GUIDEWIRE .025 260CM (WIRE) IMPLANT
GUIDEWIRE INQWIRE 1.5J.035X260 (WIRE) IMPLANT
PACK CARDIAC CATHETERIZATION (CUSTOM PROCEDURE TRAY) ×1 IMPLANT
SET ATX-X65L (MISCELLANEOUS) IMPLANT
SHEATH GLIDE SLENDER 4/5FR (SHEATH) IMPLANT
SHEATH PROBE COVER 6X72 (BAG) IMPLANT
WIRE EMERALD ST .035X150CM (WIRE) IMPLANT

## 2023-08-06 NOTE — Discharge Instructions (Signed)

## 2023-08-06 NOTE — Interval H&P Note (Signed)
 History and Physical Interval Note:  08/06/2023 1:33 PM  Andrew Lambing Sr.  has presented today for surgery, with the diagnosis of aortic stenosis.  The various methods of treatment have been discussed with the patient and family. After consideration of risks, benefits and other options for treatment, the patient has consented to  Procedure(s): RIGHT/LEFT HEART CATH AND CORONARY ANGIOGRAPHY (N/A) as a surgical intervention.  The patient's history has been reviewed, patient examined, no change in status, stable for surgery.  I have reviewed the patient's chart and labs.  Questions were answered to the patient's satisfaction.    Cath Lab Visit (complete for each Cath Lab visit)  Clinical Evaluation Leading to the Procedure:   ACS: No.  Non-ACS:    Anginal Classification: CCS II  Anti-ischemic medical therapy: Maximal Therapy (2 or more classes of medications)  Non-Invasive Test Results: No non-invasive testing performed  Prior CABG: No previous CABG    Antoinette Batman

## 2023-08-08 ENCOUNTER — Encounter (HOSPITAL_COMMUNITY): Payer: Self-pay | Admitting: Cardiovascular Disease

## 2023-08-09 ENCOUNTER — Telehealth: Payer: Self-pay | Admitting: Cardiology

## 2023-08-09 ENCOUNTER — Other Ambulatory Visit: Payer: Self-pay | Admitting: Urology

## 2023-08-09 ENCOUNTER — Ambulatory Visit: Payer: Self-pay | Admitting: Cardiology

## 2023-08-09 ENCOUNTER — Other Ambulatory Visit (HOSPITAL_COMMUNITY)
Admission: RE | Admit: 2023-08-09 | Discharge: 2023-08-09 | Disposition: A | Discharge status: Home / Self Care | Source: Ambulatory Visit | Attending: Cardiology | Admitting: Cardiology

## 2023-08-09 DIAGNOSIS — N289 Disorder of kidney and ureter, unspecified: Secondary | ICD-10-CM | POA: Insufficient documentation

## 2023-08-09 DIAGNOSIS — N401 Enlarged prostate with lower urinary tract symptoms: Secondary | ICD-10-CM

## 2023-08-09 LAB — BASIC METABOLIC PANEL WITH GFR
Anion gap: 9 (ref 5–15)
BUN: 20 mg/dL (ref 8–23)
CO2: 25 mmol/L (ref 22–32)
Calcium: 9.6 mg/dL (ref 8.9–10.3)
Chloride: 103 mmol/L (ref 98–111)
Creatinine, Ser: 1.25 mg/dL — ABNORMAL HIGH (ref 0.61–1.24)
GFR, Estimated: 58 mL/min — ABNORMAL LOW (ref >60)
Glucose, Bld: 168 mg/dL — ABNORMAL HIGH (ref 70–99)
Potassium: 4.9 mmol/L (ref 3.5–5.1)
Sodium: 137 mmol/L (ref 135–145)

## 2023-08-09 NOTE — Telephone Encounter (Signed)
 Wife [name]) stated patient will need to get pre-certification to have CT CARDIAC SCORING test done at [facility].  Wife wants a call back to confirm patient would need pre-approval to have test done at [facility] or patient can come to [facility] if no pre-approval is needed.

## 2023-08-11 MED FILL — Verapamil HCl IV Soln 2.5 MG/ML: INTRAVENOUS | Qty: 2 | Status: AC
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
## 2023-08-24 ENCOUNTER — Encounter: Payer: Self-pay | Admitting: Urology

## 2023-08-24 ENCOUNTER — Ambulatory Visit (INDEPENDENT_AMBULATORY_CARE_PROVIDER_SITE_OTHER): Payer: Medicare Other | Admitting: Urology

## 2023-08-24 VITALS — BP 98/61 | HR 68

## 2023-08-24 DIAGNOSIS — N401 Enlarged prostate with lower urinary tract symptoms: Secondary | ICD-10-CM | POA: Diagnosis not present

## 2023-08-24 DIAGNOSIS — R351 Nocturia: Secondary | ICD-10-CM

## 2023-08-24 DIAGNOSIS — R350 Frequency of micturition: Secondary | ICD-10-CM

## 2023-08-24 DIAGNOSIS — R3915 Urgency of urination: Secondary | ICD-10-CM | POA: Diagnosis not present

## 2023-08-24 DIAGNOSIS — N5201 Erectile dysfunction due to arterial insufficiency: Secondary | ICD-10-CM

## 2023-08-24 NOTE — Progress Notes (Signed)
 Bladder Scan completed today.  Patient can void prior to the bladder scan. Bladder scan result: 0  Performed By: Exie T. CMA

## 2023-08-25 ENCOUNTER — Ambulatory Visit (HOSPITAL_COMMUNITY)

## 2023-08-30 ENCOUNTER — Other Ambulatory Visit: Payer: Self-pay | Admitting: Internal Medicine
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
## 2023-09-06 ENCOUNTER — Ambulatory Visit: Attending: Cardiovascular Disease | Admitting: Cardiovascular Disease

## 2023-09-06 ENCOUNTER — Other Ambulatory Visit: Payer: Self-pay

## 2023-09-06 ENCOUNTER — Other Ambulatory Visit: Payer: Self-pay | Admitting: *Deleted

## 2023-09-06 ENCOUNTER — Encounter: Payer: Self-pay | Admitting: Cardiovascular Disease

## 2023-09-06 VITALS — BP 96/54 | HR 76

## 2023-09-06 DIAGNOSIS — Z01812 Encounter for preprocedural laboratory examination: Secondary | ICD-10-CM | POA: Insufficient documentation

## 2023-09-06 DIAGNOSIS — I350 Nonrheumatic aortic (valve) stenosis: Secondary | ICD-10-CM

## 2023-09-06 DIAGNOSIS — N289 Disorder of kidney and ureter, unspecified: Secondary | ICD-10-CM | POA: Diagnosis not present

## 2023-09-06 DIAGNOSIS — I6523 Occlusion and stenosis of bilateral carotid arteries: Secondary | ICD-10-CM

## 2023-09-06 NOTE — Progress Notes (Addendum)
 Pre Surgical Assessment: 5 M Walk Test  78M=16.7ft  5 Meter Walk Test- trial 1: 6.57 seconds 5 Meter Walk Test- trial 2: 6.09 seconds 5 Meter Walk Test- trial 3: 5.69 seconds 5 Meter Walk Test Average: 6.11 seconds  ___________________   Procedure Type: Isolated AVR Perioperative Outcome Estimate % Operative Mortality 3.96% Morbidity & Mortality 12.2% Stroke 2.7% Renal Failure 4.14% Reoperation 2.81% Prolonged Ventilation 5.77% Deep Sternal Wound Infection 0.151% Long Hospital Stay (>14 days) 8.76% Short Hospital Stay (<6 days)* 26.8%

## 2023-09-06 NOTE — Progress Notes (Signed)
 Updated order for carotid US , prev order will have been expired by the time of the appointment.

## 2023-09-06 NOTE — Patient Instructions (Signed)
 Medication Instructions:  Stop amlodipine  *If you need a refill on your cardiac medications before your next appointment, please call your pharmacy*  Lab Work: Go to the first floor lab today: bmet  If you have labs (blood work) drawn today and your tests are completely normal, you will receive your results only by: MyChart Message (if you have MyChart) OR A paper copy in the mail If you have any lab test that is abnormal or we need to change your treatment, we will call you to review the results.  Testing/Procedures: Ct scans. See instruction letter given today  Follow-Up: Per Structural Heart Team

## 2023-09-07 LAB — BASIC METABOLIC PANEL WITH GFR
BUN/Creatinine Ratio: 18 (ref 10–24)
BUN: 24 mg/dL (ref 8–27)
CO2: 21 mmol/L (ref 20–29)
Calcium: 9.7 mg/dL (ref 8.6–10.2)
Chloride: 104 mmol/L (ref 96–106)
Creatinine, Ser: 1.34 mg/dL — ABNORMAL HIGH (ref 0.76–1.27)
Glucose: 142 mg/dL — ABNORMAL HIGH (ref 70–99)
Potassium: 4.7 mmol/L (ref 3.5–5.2)
Sodium: 142 mmol/L (ref 134–144)
eGFR: 54 mL/min/1.73 — ABNORMAL LOW (ref >59)

## 2023-09-08 ENCOUNTER — Inpatient Hospital Stay (HOSPITAL_COMMUNITY): Admission: RE | Admit: 2023-09-08 | Source: Ambulatory Visit

## 2023-09-14 ENCOUNTER — Ambulatory Visit (HOSPITAL_COMMUNITY): Admission: RE | Admit: 2023-09-14 | Source: Ambulatory Visit

## 2023-09-14 ENCOUNTER — Ambulatory Visit (HOSPITAL_COMMUNITY)
Admission: RE | Admit: 2023-09-14 | Discharge: 2023-09-14 | Disposition: A | Discharge status: Home / Self Care | Source: Ambulatory Visit | Attending: Internal Medicine | Admitting: Internal Medicine

## 2023-09-14 DIAGNOSIS — I350 Nonrheumatic aortic (valve) stenosis: Secondary | ICD-10-CM | POA: Diagnosis not present

## 2023-09-14 DIAGNOSIS — N400 Enlarged prostate without lower urinary tract symptoms: Secondary | ICD-10-CM | POA: Diagnosis not present

## 2023-09-14 DIAGNOSIS — I700 Atherosclerosis of aorta: Secondary | ICD-10-CM | POA: Diagnosis not present

## 2023-09-14 DIAGNOSIS — K449 Diaphragmatic hernia without obstruction or gangrene: Secondary | ICD-10-CM | POA: Insufficient documentation

## 2023-09-14 DIAGNOSIS — Q600 Renal agenesis, unilateral: Secondary | ICD-10-CM | POA: Diagnosis not present

## 2023-09-14 DIAGNOSIS — I2510 Atherosclerotic heart disease of native coronary artery without angina pectoris: Secondary | ICD-10-CM | POA: Insufficient documentation

## 2023-09-14 DIAGNOSIS — K5730 Diverticulosis of large intestine without perforation or abscess without bleeding: Secondary | ICD-10-CM | POA: Diagnosis not present

## 2023-09-14 DIAGNOSIS — I517 Cardiomegaly: Secondary | ICD-10-CM | POA: Insufficient documentation

## 2023-09-14 DIAGNOSIS — Z01810 Encounter for preprocedural cardiovascular examination: Secondary | ICD-10-CM | POA: Diagnosis not present

## 2023-09-14 MED ORDER — IOHEXOL 350 MG/ML SOLN
100.0000 mL | Freq: Once | INTRAVENOUS | Status: AC | PRN
  Administered 2023-09-14: 100 mL via INTRAVENOUS

## 2023-09-16 DIAGNOSIS — X32XXXD Exposure to sunlight, subsequent encounter: Secondary | ICD-10-CM | POA: Diagnosis not present

## 2023-09-16 DIAGNOSIS — L570 Actinic keratosis: Secondary | ICD-10-CM | POA: Diagnosis not present

## 2023-09-17 ENCOUNTER — Ambulatory Visit: Payer: Self-pay | Admitting: Cardiovascular Disease

## 2023-09-21 ENCOUNTER — Other Ambulatory Visit: Payer: Self-pay | Admitting: Urology

## 2023-09-21 ENCOUNTER — Telehealth: Payer: Self-pay | Admitting: Urology

## 2023-09-21 DIAGNOSIS — N401 Enlarged prostate with lower urinary tract symptoms: Secondary | ICD-10-CM

## 2023-09-21 NOTE — Telephone Encounter (Signed)
 Return call to pt's wife and she state's that she think it may be home laser -prostate but unsure due to she can not read Dr. Matilda hand writing. Pt's wife is aware a message will be sent to the provider for clarification.

## 2023-09-21 NOTE — Telephone Encounter (Signed)
 Said Dr D had told him to go to a Dr Ky and she would like to get referral put in

## 2023-09-22 NOTE — Telephone Encounter (Signed)
 Pt's wife is made aware and voiced understanding.This is not an urgent referral

## 2023-09-22 NOTE — Telephone Encounter (Signed)
 The Dr in [facility] scheduled for Sept 3 and she wants to know if this is urgent and needs to be seen before then. The office in [place] told her to check with us  and see if he needs to be seen sooner.

## 2023-09-28 ENCOUNTER — Telehealth: Payer: Self-pay | Admitting: Cardiology

## 2023-09-28 NOTE — Telephone Encounter (Signed)
 Pt c/o BP issue: STAT if pt c/o blurred vision, one-sided weakness or slurred speech.  STAT if BP is GREATER than 180/120 TODAY.  STAT if BP is LESS than 90/60 and SYMPTOMATIC TODAY  1. What is your BP concern? Pt concerned that bp is slight elevated since some of his meds were adjusted  2. Have you taken any BP medication today? No   3. What are your last 5 BP readings?  139/87 hr 71 - most recent  155/76 hr 69 153/79 hr 67  145/74 hr 69  162/89 hr 62  179/74 hr 64  188/79 hr 72   4. Are you having any other symptoms (ex. Dizziness, headache, blurred vision, passed out)? Headache

## 2023-09-29 MED ORDER — AMLODIPINE BESYLATE 2.5 MG PO TABS: 2.5000 mg | ORAL_TABLET | Freq: Every day | ORAL | 3 refills | Status: DC

## 2023-09-29 NOTE — Telephone Encounter (Signed)
 Spoke with pt, he reports the amlodipine  was stopped due to low blood pressure. He will restart amlodipine  2.5 mg once daily/ New script sent to the pharmacy

## 2023-09-30 NOTE — H&P (View-Only) (Signed)
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   GLUCOSE 142 (H) 09/06/2023   CHOL 121 08/31/2022   TRIG 120 08/31/2022   HDL 38 (L) 08/31/2022   LDLDIRECT 51.0 07/24/2016   LDLCALC 59 08/31/2022   ALT 21 04/08/2023   AST 17 04/08/2023   NA 142 09/06/2023   K 4.7 09/06/2023   CL 104 09/06/2023   CREATININE 1.34 (H) 09/06/2023   BUN 24 09/06/2023   CO2 21 09/06/2023   TSH 1.550 04/21/2022   INR 1.0 07/13/2018   HGBA1C 7.7 (H) 04/08/2023      Assessment / Plan:   83 y.o. male with moderate to severe aortic stenosis.  STS score: 3.96.  NYHA Class II.  The risks and benefits of transfemoral TAVR were discussed in  detail.  We also discussed possibility of an emergent sternotomy to address any procedural complications.  Based on our discussion, we collectively decided that an emergent sternotomy would not be indicated.  The patient is agreeable to proceed.  Based on my review of her LHC, echo, and CTA, I agree with the multidisciplinary plan to proceed with a transfemoral 26mm Sapien TAVR.      I  spent 40 minutes counseling the patient face to face.   Linnie MALVA Rayas 10/04/2023 10:42 AM

## 2023-09-30 NOTE — Progress Notes (Signed)
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   GLUCOSE 142 (H) 09/06/2023   CHOL 121 08/31/2022   TRIG 120 08/31/2022   HDL 38 (L) 08/31/2022   LDLDIRECT 51.0 07/24/2016   LDLCALC 59 08/31/2022   ALT 21 04/08/2023   AST 17 04/08/2023   NA 142 09/06/2023   K 4.7 09/06/2023   CL 104 09/06/2023   CREATININE 1.34 (H) 09/06/2023   BUN 24 09/06/2023   CO2 21 09/06/2023   TSH 1.550 04/21/2022   INR 1.0 07/13/2018   HGBA1C 7.7 (H) 04/08/2023      Assessment / Plan:   83 y.o. male with moderate to severe aortic stenosis.  STS score: 3.96.  NYHA Class II.  The risks and benefits of transfemoral TAVR were discussed in  detail.  We also discussed possibility of an emergent sternotomy to address any procedural complications.  Based on our discussion, we collectively decided that an emergent sternotomy would not be indicated.  The patient is agreeable to proceed.  Based on my review of her LHC, echo, and CTA, I agree with the multidisciplinary plan to proceed with a transfemoral 26mm Sapien TAVR.      I  spent 40 minutes counseling the patient face to face.   Linnie MALVA Rayas 10/04/2023 10:42 AM

## 2023-10-01 ENCOUNTER — Ambulatory Visit
Attending: Thoracic Surgery (Cardiothoracic Vascular Surgery) | Admitting: Thoracic Surgery (Cardiothoracic Vascular Surgery)

## 2023-10-01 ENCOUNTER — Encounter: Payer: Self-pay | Admitting: Thoracic Surgery (Cardiothoracic Vascular Surgery)

## 2023-10-01 VITALS — BP 125/67 | HR 72 | Resp 20 | Ht 70.5 in | Wt 191.2 lb

## 2023-10-01 DIAGNOSIS — I350 Nonrheumatic aortic (valve) stenosis: Secondary | ICD-10-CM | POA: Insufficient documentation
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
## 2023-10-05 ENCOUNTER — Telehealth: Payer: Self-pay | Admitting: Internal Medicine

## 2023-10-05 ENCOUNTER — Other Ambulatory Visit: Payer: Self-pay

## 2023-10-05 DIAGNOSIS — I350 Nonrheumatic aortic (valve) stenosis: Secondary | ICD-10-CM

## 2023-10-05 NOTE — Telephone Encounter (Signed)
 Copied from CRM #[id number]. Topic: General - Other >> Oct 05, 2023  9:53 AM Franky GRADE wrote: Reason for CRM: Patient's spouse is calling to advise that patient is having valve repair surgery and just wanted Dr.Plotnikov to be aware. Surgery date 10/12/2023

## 2023-10-07 ENCOUNTER — Other Ambulatory Visit: Payer: Self-pay

## 2023-10-07 NOTE — Telephone Encounter (Signed)
 Noted.  Good luck with surgery!  Thanks

## 2023-10-08 ENCOUNTER — Ambulatory Visit (HOSPITAL_COMMUNITY)
Admission: RE | Admit: 2023-10-08 | Discharge: 2023-10-08 | Disposition: A | Discharge status: Home / Self Care | Source: Ambulatory Visit | Attending: Cardiovascular Disease | Admitting: Cardiovascular Disease

## 2023-10-08 ENCOUNTER — Encounter (HOSPITAL_COMMUNITY)
Admission: RE | Admit: 2023-10-08 | Discharge: 2023-10-08 | Disposition: A | Discharge status: Home / Self Care | Source: Ambulatory Visit | Attending: Cardiovascular Disease | Admitting: Cardiovascular Disease

## 2023-10-08 ENCOUNTER — Other Ambulatory Visit: Payer: Self-pay

## 2023-10-08 DIAGNOSIS — I350 Nonrheumatic aortic (valve) stenosis: Secondary | ICD-10-CM | POA: Diagnosis not present

## 2023-10-08 DIAGNOSIS — I493 Ventricular premature depolarization: Secondary | ICD-10-CM | POA: Insufficient documentation

## 2023-10-08 DIAGNOSIS — Z01818 Encounter for other preprocedural examination: Secondary | ICD-10-CM | POA: Diagnosis not present

## 2023-10-08 DIAGNOSIS — I700 Atherosclerosis of aorta: Secondary | ICD-10-CM | POA: Diagnosis not present

## 2023-10-08 LAB — CBC
HCT: 39.3 % (ref 39.0–52.0)
Hemoglobin: 12.8 g/dL — ABNORMAL LOW (ref 13.0–17.0)
MCH: 29.6 pg (ref 26.0–34.0)
MCHC: 32.6 g/dL (ref 30.0–36.0)
MCV: 91 fL (ref 80.0–100.0)
Platelets: 228 K/uL (ref 150–400)
RBC: 4.32 MIL/uL (ref 4.22–5.81)
RDW: 12.6 % (ref 11.5–15.5)
WBC: 6.5 K/uL (ref 4.0–10.5)
nRBC: 0 % (ref 0.0–0.2)

## 2023-10-08 LAB — SURGICAL PCR SCREEN
MRSA, PCR: NEGATIVE
Staphylococcus aureus: NEGATIVE

## 2023-10-08 LAB — URINALYSIS, ROUTINE W REFLEX MICROSCOPIC
Bilirubin Urine: NEGATIVE
Glucose, UA: NEGATIVE mg/dL
Hgb urine dipstick: NEGATIVE
Ketones, ur: NEGATIVE mg/dL
Leukocytes,Ua: NEGATIVE
Nitrite: NEGATIVE
Protein, ur: NEGATIVE mg/dL
Specific Gravity, Urine: 1.015 (ref 1.005–1.030)
pH: 5 (ref 5.0–8.0)

## 2023-10-08 LAB — COMPREHENSIVE METABOLIC PANEL WITH GFR
ALT: 15 U/L (ref 0–44)
AST: 16 U/L (ref 15–41)
Albumin: 4.1 g/dL (ref 3.5–5.0)
Alkaline Phosphatase: 56 U/L (ref 38–126)
Anion gap: 9 (ref 5–15)
BUN: 22 mg/dL (ref 8–23)
CO2: 26 mmol/L (ref 22–32)
Calcium: 9.9 mg/dL (ref 8.9–10.3)
Chloride: 104 mmol/L (ref 98–111)
Creatinine, Ser: 1.35 mg/dL — ABNORMAL HIGH (ref 0.61–1.24)
GFR, Estimated: 53 mL/min — ABNORMAL LOW (ref >60)
Glucose, Bld: 139 mg/dL — ABNORMAL HIGH (ref 70–99)
Potassium: 4.9 mmol/L (ref 3.5–5.1)
Sodium: 139 mmol/L (ref 135–145)
Total Bilirubin: 0.9 mg/dL (ref 0.0–1.2)
Total Protein: 7.2 g/dL (ref 6.5–8.1)

## 2023-10-08 LAB — TYPE AND SCREEN
ABO/RH(D): AB POS
Antibody Screen: NEGATIVE

## 2023-10-08 LAB — PROTIME-INR
INR: 1 (ref 0.8–1.2)
Prothrombin Time: 13.8 s (ref 11.4–15.2)

## 2023-10-08 NOTE — Progress Notes (Signed)
 Patient signed all consents at PAT lab appointment. CHG soap and instructions were given to patient. CHG surgical prep reviewed with patient and all questions answered.  Patients chart send to anesthesia for review. Pt denies any respiratory illness/infection in the last two months.

## 2023-10-11 MED ORDER — MAGNESIUM SULFATE 50 % IJ SOLN
40.0000 meq | INTRAMUSCULAR | Status: DC
  Filled 2023-10-11: qty 9.85

## 2023-10-11 MED ORDER — CEFAZOLIN SODIUM-DEXTROSE 2-4 GM/100ML-% IV SOLN
2.0000 g | INTRAVENOUS | Status: AC
  Administered 2023-10-12 (×2): 2 g via INTRAVENOUS
  Filled 2023-10-11: qty 100

## 2023-10-11 MED ORDER — DEXMEDETOMIDINE HCL IN NACL 400 MCG/100ML IV SOLN
0.1000 ug/kg/h | INTRAVENOUS | Status: AC
  Administered 2023-10-12 (×2): 1 ug/kg/h via INTRAVENOUS
  Filled 2023-10-11: qty 100

## 2023-10-11 MED ORDER — NOREPINEPHRINE 4 MG/250ML-% IV SOLN
0.0000 ug/min | INTRAVENOUS | Status: AC
  Administered 2023-10-12 (×2): 1 ug/min via INTRAVENOUS
  Filled 2023-10-11: qty 250

## 2023-10-11 MED ORDER — HEPARIN 30,000 UNITS/1000 ML (OHS) CELLSAVER SOLUTION
Status: DC
  Filled 2023-10-11: qty 1000

## 2023-10-11 MED ORDER — POTASSIUM CHLORIDE 2 MEQ/ML IV SOLN
80.0000 meq | INTRAVENOUS | Status: DC
  Filled 2023-10-11: qty 40

## 2023-10-12 ENCOUNTER — Other Ambulatory Visit: Payer: Self-pay

## 2023-10-12 ENCOUNTER — Encounter (HOSPITAL_COMMUNITY)
Admission: RE | Disposition: A | Discharge status: Home / Self Care | Payer: Self-pay | Source: Home / Self Care | Attending: Thoracic Surgery (Cardiothoracic Vascular Surgery)

## 2023-10-12 ENCOUNTER — Inpatient Hospital Stay (HOSPITAL_COMMUNITY): Payer: Self-pay | Admitting: Anesthesiology

## 2023-10-12 ENCOUNTER — Inpatient Hospital Stay (HOSPITAL_COMMUNITY)
Admission: RE | Admit: 2023-10-12 | Discharge: 2023-10-13 | DRG: 267 | Disposition: A | Discharge status: Home / Self Care | Attending: Thoracic Surgery (Cardiothoracic Vascular Surgery) | Admitting: Thoracic Surgery (Cardiothoracic Vascular Surgery)

## 2023-10-12 ENCOUNTER — Encounter (HOSPITAL_COMMUNITY): Payer: Self-pay | Admitting: Physician Assistant

## 2023-10-12 ENCOUNTER — Encounter (HOSPITAL_COMMUNITY): Payer: Self-pay | Admitting: Cardiovascular Disease

## 2023-10-12 ENCOUNTER — Inpatient Hospital Stay (HOSPITAL_COMMUNITY)

## 2023-10-12 ENCOUNTER — Ambulatory Visit: Admitting: Internal Medicine

## 2023-10-12 DIAGNOSIS — I2510 Atherosclerotic heart disease of native coronary artery without angina pectoris: Secondary | ICD-10-CM | POA: Diagnosis present

## 2023-10-12 DIAGNOSIS — I10 Essential (primary) hypertension: Secondary | ICD-10-CM

## 2023-10-12 DIAGNOSIS — E785 Hyperlipidemia, unspecified: Secondary | ICD-10-CM | POA: Diagnosis present

## 2023-10-12 DIAGNOSIS — Z803 Family history of malignant neoplasm of breast: Secondary | ICD-10-CM

## 2023-10-12 DIAGNOSIS — E1151 Type 2 diabetes mellitus with diabetic peripheral angiopathy without gangrene: Secondary | ICD-10-CM | POA: Diagnosis present

## 2023-10-12 DIAGNOSIS — R221 Localized swelling, mass and lump, neck: Secondary | ICD-10-CM | POA: Diagnosis not present

## 2023-10-12 DIAGNOSIS — E1122 Type 2 diabetes mellitus with diabetic chronic kidney disease: Secondary | ICD-10-CM | POA: Diagnosis present

## 2023-10-12 DIAGNOSIS — K219 Gastro-esophageal reflux disease without esophagitis: Secondary | ICD-10-CM | POA: Diagnosis present

## 2023-10-12 DIAGNOSIS — M47812 Spondylosis without myelopathy or radiculopathy, cervical region: Secondary | ICD-10-CM | POA: Diagnosis not present

## 2023-10-12 DIAGNOSIS — Z83438 Family history of other disorder of lipoprotein metabolism and other lipidemia: Secondary | ICD-10-CM | POA: Diagnosis not present

## 2023-10-12 DIAGNOSIS — Z833 Family history of diabetes mellitus: Secondary | ICD-10-CM

## 2023-10-12 DIAGNOSIS — Z7982 Long term (current) use of aspirin: Secondary | ICD-10-CM | POA: Diagnosis not present

## 2023-10-12 DIAGNOSIS — Z823 Family history of stroke: Secondary | ICD-10-CM

## 2023-10-12 DIAGNOSIS — I350 Nonrheumatic aortic (valve) stenosis: Secondary | ICD-10-CM | POA: Diagnosis present

## 2023-10-12 DIAGNOSIS — N183 Chronic kidney disease, stage 3 unspecified: Secondary | ICD-10-CM

## 2023-10-12 DIAGNOSIS — N1831 Chronic kidney disease, stage 3a: Secondary | ICD-10-CM | POA: Diagnosis present

## 2023-10-12 DIAGNOSIS — Z79899 Other long term (current) drug therapy: Secondary | ICD-10-CM | POA: Diagnosis not present

## 2023-10-12 DIAGNOSIS — Z7984 Long term (current) use of oral hypoglycemic drugs: Secondary | ICD-10-CM | POA: Diagnosis not present

## 2023-10-12 DIAGNOSIS — Z8249 Family history of ischemic heart disease and other diseases of the circulatory system: Secondary | ICD-10-CM | POA: Diagnosis not present

## 2023-10-12 DIAGNOSIS — Z888 Allergy status to other drugs, medicaments and biological substances status: Secondary | ICD-10-CM | POA: Diagnosis not present

## 2023-10-12 DIAGNOSIS — E119 Type 2 diabetes mellitus without complications: Secondary | ICD-10-CM

## 2023-10-12 DIAGNOSIS — Z95828 Presence of other vascular implants and grafts: Secondary | ICD-10-CM | POA: Diagnosis not present

## 2023-10-12 DIAGNOSIS — Z006 Encounter for examination for normal comparison and control in clinical research program: Secondary | ICD-10-CM | POA: Diagnosis not present

## 2023-10-12 DIAGNOSIS — G4733 Obstructive sleep apnea (adult) (pediatric): Secondary | ICD-10-CM | POA: Diagnosis present

## 2023-10-12 DIAGNOSIS — D649 Anemia, unspecified: Secondary | ICD-10-CM | POA: Diagnosis present

## 2023-10-12 DIAGNOSIS — N400 Enlarged prostate without lower urinary tract symptoms: Secondary | ICD-10-CM | POA: Diagnosis present

## 2023-10-12 DIAGNOSIS — Z952 Presence of prosthetic heart valve: Secondary | ICD-10-CM | POA: Diagnosis not present

## 2023-10-12 DIAGNOSIS — I129 Hypertensive chronic kidney disease with stage 1 through stage 4 chronic kidney disease, or unspecified chronic kidney disease: Secondary | ICD-10-CM | POA: Diagnosis present

## 2023-10-12 DIAGNOSIS — Z801 Family history of malignant neoplasm of trachea, bronchus and lung: Secondary | ICD-10-CM

## 2023-10-12 DIAGNOSIS — Z7902 Long term (current) use of antithrombotics/antiplatelets: Secondary | ICD-10-CM

## 2023-10-12 DIAGNOSIS — Z8673 Personal history of transient ischemic attack (TIA), and cerebral infarction without residual deficits: Secondary | ICD-10-CM | POA: Diagnosis not present

## 2023-10-12 DIAGNOSIS — I6782 Cerebral ischemia: Secondary | ICD-10-CM | POA: Diagnosis not present

## 2023-10-12 DIAGNOSIS — I6523 Occlusion and stenosis of bilateral carotid arteries: Secondary | ICD-10-CM | POA: Diagnosis not present

## 2023-10-12 DIAGNOSIS — Z825 Family history of asthma and other chronic lower respiratory diseases: Secondary | ICD-10-CM

## 2023-10-12 DIAGNOSIS — I6350 Cerebral infarction due to unspecified occlusion or stenosis of unspecified cerebral artery: Secondary | ICD-10-CM | POA: Diagnosis present

## 2023-10-12 DIAGNOSIS — M542 Cervicalgia: Secondary | ICD-10-CM | POA: Diagnosis not present

## 2023-10-12 DIAGNOSIS — I739 Peripheral vascular disease, unspecified: Secondary | ICD-10-CM | POA: Diagnosis present

## 2023-10-12 DIAGNOSIS — Z87891 Personal history of nicotine dependence: Secondary | ICD-10-CM

## 2023-10-12 HISTORY — DX: Nonrheumatic aortic (valve) stenosis: I35.0

## 2023-10-12 HISTORY — PX: INTRAOPERATIVE TRANSTHORACIC ECHOCARDIOGRAM: SHX6523

## 2023-10-12 LAB — ECHOCARDIOGRAM LIMITED
AR max vel: 2 cm2
AV Area VTI: 2.23 cm2
AV Area mean vel: 1.09 cm2
AV Mean grad: 4.7 mmHg
AV Peak grad: 7.8 mmHg
Ao pk vel: 1.4 m/s

## 2023-10-12 LAB — POCT I-STAT, CHEM 8
BUN: 20 mg/dL (ref 8–23)
Calcium, Ion: 1.25 mmol/L (ref 1.15–1.40)
Chloride: 106 mmol/L (ref 98–111)
Creatinine, Ser: 1.2 mg/dL (ref 0.61–1.24)
Glucose, Bld: 146 mg/dL — ABNORMAL HIGH (ref 70–99)
HCT: 48 % (ref 39.0–52.0)
Hemoglobin: 16.3 g/dL (ref 13.0–17.0)
Potassium: 4 mmol/L (ref 3.5–5.1)
Sodium: 142 mmol/L (ref 135–145)
TCO2: 23 mmol/L (ref 22–32)

## 2023-10-12 LAB — POCT ACTIVATED CLOTTING TIME
Activated Clotting Time: 222 s
Activated Clotting Time: 268 s

## 2023-10-12 MED ORDER — ACETAMINOPHEN 325 MG PO TABS: 650.0000 mg | ORAL_TABLET | Freq: Four times a day (QID) | ORAL | Status: DC | PRN

## 2023-10-12 MED ORDER — SODIUM CHLORIDE 0.9 % IV SOLN: INTRAVENOUS | Status: DC

## 2023-10-12 MED ORDER — ACETAMINOPHEN 650 MG RE SUPP: 650.0000 mg | Freq: Four times a day (QID) | RECTAL | Status: DC | PRN

## 2023-10-12 MED ORDER — OXYCODONE HCL 5 MG PO TABS: 5.0000 mg | ORAL_TABLET | ORAL | Status: DC | PRN

## 2023-10-12 MED ORDER — CHLORHEXIDINE GLUCONATE 0.12 % MT SOLN
15.0000 mL | Freq: Once | OROMUCOSAL | Status: AC
  Administered 2023-10-12 (×2): 15 mL via OROMUCOSAL

## 2023-10-12 MED ORDER — NOREPINEPHRINE 4 MG/250ML-% IV SOLN: 0.0000 ug/min | INTRAVENOUS | Status: DC

## 2023-10-12 MED ORDER — CEFAZOLIN SODIUM-DEXTROSE 2-4 GM/100ML-% IV SOLN
2.0000 g | Freq: Three times a day (TID) | INTRAVENOUS | Status: AC
  Administered 2023-10-12 – 2023-10-13 (×4): 2 g via INTRAVENOUS
  Filled 2023-10-12 (×2): qty 100

## 2023-10-12 MED ORDER — IODIXANOL 320 MG/ML IV SOLN
INTRAVENOUS | Status: DC | PRN
Start: 2023-10-12 — End: 2023-10-12
  Administered 2023-10-12 (×2): 75 mL via INTRA_ARTERIAL

## 2023-10-12 MED ORDER — SODIUM CHLORIDE 0.9% FLUSH: 3.0000 mL | INTRAVENOUS | Status: DC | PRN

## 2023-10-12 MED ORDER — FESOTERODINE FUMARATE ER 4 MG PO TB24
4.0000 mg | ORAL_TABLET | Freq: Every day | ORAL | Status: DC
  Administered 2023-10-13 (×2): 4 mg via ORAL
  Filled 2023-10-12 (×2): qty 1

## 2023-10-12 MED ORDER — TAMSULOSIN HCL 0.4 MG PO CAPS
0.4000 mg | ORAL_CAPSULE | Freq: Every day | ORAL | Status: DC
  Administered 2023-10-12 (×2): 0.4 mg via ORAL
  Filled 2023-10-12: qty 1

## 2023-10-12 MED ORDER — AMLODIPINE BESYLATE 5 MG PO TABS: 5.0000 mg | ORAL_TABLET | Freq: Every day | ORAL | Status: DC

## 2023-10-12 MED ORDER — PROTAMINE SULFATE 10 MG/ML IV SOLN
INTRAVENOUS | Status: DC | PRN
  Administered 2023-10-12 (×2): 180 mg via INTRAVENOUS

## 2023-10-12 MED ORDER — CHLORHEXIDINE GLUCONATE 4 % EX SOLN: 60.0000 mL | Freq: Once | CUTANEOUS | Status: DC

## 2023-10-12 MED ORDER — ACYCLOVIR 200 MG PO CAPS
200.0000 mg | ORAL_CAPSULE | Freq: Two times a day (BID) | ORAL | Status: DC
Start: 2023-10-12 — End: 2023-10-13
  Administered 2023-10-12 – 2023-10-13 (×4): 200 mg via ORAL
  Filled 2023-10-12 (×3): qty 1

## 2023-10-12 MED ORDER — PREDNISONE 5 MG PO TABS
5.0000 mg | ORAL_TABLET | Freq: Every day | ORAL | Status: DC
Start: 2023-10-13 — End: 2023-10-13
  Administered 2023-10-13 (×2): 5 mg via ORAL
  Filled 2023-10-12: qty 1

## 2023-10-12 MED ORDER — HEPARIN (PORCINE) IN NACL 1000-0.9 UT/500ML-% IV SOLN
INTRAVENOUS | Status: DC | PRN
  Administered 2023-10-12 (×2): 1000 mL via SURGICAL_CAVITY

## 2023-10-12 MED ORDER — LIDOCAINE HCL (PF) 1 % IJ SOLN
INTRAMUSCULAR | Status: DC | PRN
Start: 2023-10-12 — End: 2023-10-12
  Administered 2023-10-12 (×2): 20 mL

## 2023-10-12 MED ORDER — SODIUM CHLORIDE 0.9 % IV SOLN: 250.0000 mL | INTRAVENOUS | Status: DC | PRN

## 2023-10-12 MED ORDER — SODIUM CHLORIDE 0.9 % IV SOLN: INTRAVENOUS | Status: AC

## 2023-10-12 MED ORDER — NITROGLYCERIN IN D5W 200-5 MCG/ML-% IV SOLN
0.0000 ug/min | INTRAVENOUS | Status: DC
  Administered 2023-10-12 (×2): 5 ug/min via INTRAVENOUS
  Filled 2023-10-12: qty 250

## 2023-10-12 MED ORDER — EZETIMIBE 10 MG PO TABS
10.0000 mg | ORAL_TABLET | Freq: Every day | ORAL | Status: DC
  Administered 2023-10-12 – 2023-10-13 (×4): 10 mg via ORAL
  Filled 2023-10-12 (×2): qty 1

## 2023-10-12 MED ORDER — MORPHINE SULFATE (PF) 2 MG/ML IV SOLN
1.0000 mg | INTRAVENOUS | Status: DC | PRN
  Administered 2023-10-12 (×2): 1 mg via INTRAVENOUS
  Filled 2023-10-12: qty 1

## 2023-10-12 MED ORDER — AMLODIPINE BESYLATE 5 MG PO TABS
5.0000 mg | ORAL_TABLET | Freq: Every day | ORAL | Status: DC
  Administered 2023-10-12 – 2023-10-13 (×4): 5 mg via ORAL
  Filled 2023-10-12 (×2): qty 1

## 2023-10-12 MED ORDER — CITALOPRAM HYDROBROMIDE 20 MG PO TABS
10.0000 mg | ORAL_TABLET | Freq: Every day | ORAL | Status: DC
  Administered 2023-10-12 (×2): 10 mg via ORAL
  Filled 2023-10-12: qty 1

## 2023-10-12 MED ORDER — TRAMADOL HCL 50 MG PO TABS
50.0000 mg | ORAL_TABLET | ORAL | Status: DC | PRN
  Administered 2023-10-12 (×2): 100 mg via ORAL
  Filled 2023-10-12: qty 2

## 2023-10-12 MED ORDER — CHLORHEXIDINE GLUCONATE 4 % EX SOLN: 30.0000 mL | CUTANEOUS | Status: DC

## 2023-10-12 MED ORDER — PROPOFOL 10 MG/ML IV BOLUS
INTRAVENOUS | Status: DC | PRN
  Administered 2023-10-12 (×4): 20 mg via INTRAVENOUS

## 2023-10-12 MED ORDER — PITAVASTATIN MAGNESIUM 4 MG PO TABS: 1.0000 | ORAL_TABLET | Freq: Every day | ORAL | Status: DC

## 2023-10-12 MED ORDER — LIDOCAINE 5 % EX PTCH
1.0000 | MEDICATED_PATCH | CUTANEOUS | Status: DC
  Administered 2023-10-12 (×2): 1 via TRANSDERMAL
  Filled 2023-10-12: qty 1

## 2023-10-12 MED ORDER — PANTOPRAZOLE SODIUM 40 MG PO TBEC
40.0000 mg | DELAYED_RELEASE_TABLET | Freq: Every day | ORAL | Status: DC
  Administered 2023-10-12 – 2023-10-13 (×4): 40 mg via ORAL
  Filled 2023-10-12 (×2): qty 1

## 2023-10-12 MED ORDER — LACTATED RINGERS IV SOLN: INTRAVENOUS | Status: DC | PRN

## 2023-10-12 MED ORDER — ONDANSETRON HCL 4 MG/2ML IJ SOLN
4.0000 mg | Freq: Four times a day (QID) | INTRAMUSCULAR | Status: DC | PRN
  Administered 2023-10-13 (×2): 4 mg via INTRAVENOUS
  Filled 2023-10-12: qty 2

## 2023-10-12 MED ORDER — SODIUM CHLORIDE 0.9% FLUSH: 3.0000 mL | Freq: Two times a day (BID) | INTRAVENOUS | Status: DC

## 2023-10-12 MED ORDER — CHLORHEXIDINE GLUCONATE 0.12 % MT SOLN: 15.0000 mL | Freq: Once | OROMUCOSAL | Status: DC

## 2023-10-12 MED ORDER — LIDOCAINE HCL (PF) 1 % IJ SOLN
INTRAMUSCULAR | Status: AC
  Filled 2023-10-12: qty 30

## 2023-10-12 MED ORDER — MORPHINE SULFATE (PF) 2 MG/ML IV SOLN
2.0000 mg | Freq: Once | INTRAVENOUS | Status: AC
  Administered 2023-10-12 (×2): 2 mg via INTRAVENOUS
  Filled 2023-10-12: qty 1

## 2023-10-12 MED ORDER — HEPARIN SODIUM (PORCINE) 1000 UNIT/ML IJ SOLN
INTRAMUSCULAR | Status: DC | PRN
  Administered 2023-10-12 (×2): 5000 [IU] via INTRAVENOUS
  Administered 2023-10-12 (×2): 13000 [IU] via INTRAVENOUS

## 2023-10-12 MED ORDER — ASPIRIN 81 MG PO TBEC
81.0000 mg | DELAYED_RELEASE_TABLET | Freq: Every day | ORAL | Status: DC
  Administered 2023-10-13 (×2): 81 mg via ORAL
  Filled 2023-10-12: qty 1

## 2023-10-12 NOTE — Anesthesia Postprocedure Evaluation (Signed)
 Anesthesia Post Note  Patient: Andrew ELLNER Sr.  Procedure(s) Performed: Transcatheter Aortic Valve Replacement, Transfemoral ECHOCARDIOGRAM, TRANSTHORACIC     Patient location during evaluation: Cath Lab Anesthesia Type: MAC Level of consciousness: awake Pain management: pain level controlled Vital Signs Assessment: post-procedure vital signs reviewed and stable Respiratory status: spontaneous breathing, nonlabored ventilation and respiratory function stable Cardiovascular status: blood pressure returned to baseline and stable Postop Assessment: no apparent nausea or vomiting Anesthetic complications: no   There were no known notable events for this encounter.  Last Vitals:  Vitals:   10/12/23 1621 10/12/23 1630  BP: (!) 169/61 (!) 171/72  Pulse: (!) 57 (!) 57  Resp:    Temp:    SpO2: 97% 97%    Last Pain:  Vitals:   10/12/23 1159  TempSrc: Temporal  PainSc:                  [name] Khandi Kernes

## 2023-10-12 NOTE — Op Note (Signed)
 [facility]   MULTIDISCIPLINARY HEART VALVE TEAM     TAVR OPERATIVE NOTE     Date of Procedure:                10/12/2023   Preoperative Diagnosis:      Severe Aortic Stenosis    Postoperative Diagnosis:    Same    Procedure:        Transcatheter Aortic Valve Replacement - Percutaneous Transfemoral Approach             Edwards Sapien 3 Ultra Resilia THV (size 26 mm, serial # [id number])              Co-Surgeons:                        Linnie Rayas, MD and Ozell Fell, MD   Anesthesiologist:                  Bernardino Mango, MD   Echocardiographer:              Toney Decent, MD   Pre-operative Echo Findings: Severe aortic stenosis Normal left ventricular systolic function   Post-operative Echo Findings: No paravalvular leak Normal/unchanged left ventricular systolic function   BRIEF CLINICAL NOTE AND INDICATIONS FOR SURGERY  83 y.o. male with moderate to severe aortic stenosis.  STS score: 3.96.  NYHA Class II.  The risks and benefits of transfemoral TAVR were discussed in detail.  We also discussed possibility of an emergent sternotomy to address any procedural complications.  Based on our discussion, we collectively decided that an emergent sternotomy would not be indicated.  The patient is agreeable to proceed.  Based on my review of her LHC, echo, and CTA, I agree with the multidisciplinary plan to proceed with a transfemoral 26mm Sapien TAVR.     DETAILS OF THE OPERATIVE PROCEDURE   PREPARATION:   The patient is brought to the operating room on the above mentioned date and central monitoring was established by the anesthesia team including placement of a radial arterial line. The patient is placed in the supine position on the operating table.  Intravenous antibiotics are administered. The patient is monitored closely throughout the procedure under conscious sedation.   Baseline transthoracic echocardiogram is performed. The patient's chest,  abdomen, both groins, and both lower extremities are prepared and draped in a sterile manner. A time out procedure is performed.     PERIPHERAL ACCESS:   Using ultrasound guidance, femoral arterial and venous access is obtained with placement of 6 Fr sheaths on the left side.  US  images are digitally captured and stored in the patient's chart. A pigtail diagnostic catheter was passed through the femoral arterial sheath under fluoroscopic guidance into the aortic root.  A temporary transvenous pacemaker catheter was passed through the femoral venous sheath under fluoroscopic guidance into the right ventricle.  The pacemaker was tested to ensure stable lead placement and pacemaker capture. Aortic root angiography was performed in order to determine the optimal angiographic angle for valve deployment.   TRANSFEMORAL ACCESS:  A micropuncture technique is used to access the right femoral artery under fluoroscopic and ultrasound guidance.  2 Perclose devices are deployed at 10' and 2' positions to 'PreClose' the femoral artery. An 8 French sheath is placed and then an Amplatz Superstiff wire is advanced through the sheath. This is changed out for a 14 French transfemoral E-Sheath after progressively dilating over the  Superstiff wire.  An AL-2 catheter was used to direct a straight-tip exchange length wire across the native aortic valve into the left ventricle. This was exchanged out for a pigtail catheter and position was confirmed in the LV apex. Simultaneous LV and Ao pressures were recorded.  The pigtail catheter was exchanged for a Safari wire in the LV apex.     BALLOON AORTIC VALVULOPLASTY:  Not performed   TRANSCATHETER HEART VALVE DEPLOYMENT:  An Edwards Sapien 3 Ultra Resilia transcatheter heart valve (size 26 mm) was prepared and crimped per manufacturer's guidelines, and the proper orientation of the valve is confirmed on the [employer] delivery system. The valve was advanced through the  introducer sheath using normal technique until in an appropriate position in the abdominal aorta beyond the sheath tip. The balloon was then retracted and using the fine-tuning wheel was centered on the valve. The valve was then advanced across the aortic arch using appropriate flexion of the catheter. The valve was carefully positioned across the aortic valve annulus. The Commander catheter was retracted using normal technique. Once final position of the valve has been confirmed by angiographic assessment, the valve is deployed while temporarily holding ventilation and during rapid ventricular pacing to maintain systolic blood pressure < 50 mmHg and pulse pressure < 10 mmHg. The balloon inflation is held for >3 seconds after reaching full deployment volume. Once the balloon has fully deflated the balloon is retracted into the ascending aorta and valve function is assessed using echocardiography. The patient's hemodynamic recovery following valve deployment is good.  The deployment balloon and guidewire are both removed. Echo demostrated acceptable post-procedural gradients, stable mitral valve function, and no aortic insufficiency.      PROCEDURE COMPLETION:  The sheath was removed and femoral artery closure is performed using the 2 previously deployed Perclose devices.  Protamine  is administered once femoral arterial repair was complete. The site is clear with no evidence of bleeding or hematoma after the sutures are tightened. The temporary pacemaker and pigtail catheters are removed. Mynx closure is used for contralateral femoral arterial hemostasis for the 6 Fr sheath.   The patient tolerated the procedure well and is transported to the recovery area in stable condition. There were no immediate intraoperative complications. All sponge instrument and needle counts are verified correct at completion of the operation.    The patient received a total of 75 mL of intravenous contrast during the procedure.    EBL: minimal   LVEDP: 14 mmHg   Postop mean gradient 7 mmHg

## 2023-10-12 NOTE — Progress Notes (Signed)
 Site area: left femoral 85F venous sheath Site Prior to Removal:  Level 0 Pressure Applied For: 25 minutes Manual:   yes Patient Status During Pull:  stable Post Pull Site:  Level 0 Post Pull Instructions Given:  yes Post Pull Pulses Present: left dp palpable Dressing Applied:  gauze and tegaderm Bedrest begins @ 1115 Comments:

## 2023-10-12 NOTE — Anesthesia Preprocedure Evaluation (Addendum)
 Anesthesia Evaluation  Patient identified by MRN, date of birth, ID band Patient awake    Reviewed: Allergy & Precautions, NPO status , Patient's Chart, lab work & pertinent test results  Airway Mallampati: II  TM Distance: >3 FB Neck ROM: Full    Dental  (+) Chipped, Partial Upper,    Pulmonary sleep apnea    Pulmonary exam normal        Cardiovascular hypertension, Pt. on medications + CAD and + Peripheral Vascular Disease  + Valvular Problems/Murmurs AS  Rhythm:Regular Rate:Normal + Systolic murmurs ECHO: 1. Left ventricular ejection fraction, by estimation, is 65 to 70%. The  left ventricle has normal function. The left ventricle has no regional  wall motion abnormalities. There is moderate left ventricular hypertrophy.  Left ventricular diastolic  parameters are indeterminate.   2. Intracavitary gradient of 26 mmHg with Valsalva.   3. Right ventricular systolic function is normal. The right ventricular  size is normal. There is normal pulmonary artery systolic pressure.   4. The mitral valve is degenerative. Trivial mitral valve regurgitation.  No evidence of mitral stenosis. Moderate mitral annular calcification.   5. The aortic valve is abnormal. There is severe calcifcation of the  aortic valve. Aortic valve regurgitation is mild. Moderate aortic valve  stenosis. Aortic valve area, by VTI measures 0.97 cm. Aortic valve mean  gradient measures 33 mmHg. Aortic valve   Vmax measures 3.70 m/s. Highest gradient obtained from right sternal  border.   6. The inferior vena cava is normal in size with greater than 50%  respiratory variability, suggesting right atrial pressure of 3 mmHg.     Neuro/Psych  Headaches PSYCHIATRIC DISORDERS Anxiety Depression    CVA, No Residual Symptoms    GI/Hepatic Neg liver ROS, hiatal hernia,GERD  Medicated and Controlled,,  Endo/Other  diabetes, Oral Hypoglycemic Agents     Renal/GU Renal InsufficiencyRenal disease     Musculoskeletal  (+) Arthritis ,    Abdominal   Peds  Hematology  (+) Blood dyscrasia (Plavix )   Anesthesia Other Findings Severe Aortic Stenosis  Reproductive/Obstetrics                              Anesthesia Physical Anesthesia Plan  ASA: 4  Anesthesia Plan: MAC   Post-op Pain Management:    Induction:   PONV Risk Score and Plan: 1 and Ondansetron , Dexamethasone and Treatment may vary due to age or medical condition  Airway Management Planned: Simple Face Mask  Additional Equipment:   Intra-op Plan:   Post-operative Plan:   Informed Consent: I have reviewed the patients History and Physical, chart, labs and discussed the procedure including the risks, benefits and alternatives for the proposed anesthesia with the patient or authorized representative who has indicated his/her understanding and acceptance.     Dental advisory given  Plan Discussed with: CRNA  Anesthesia Plan Comments:          Anesthesia Quick Evaluation

## 2023-10-12 NOTE — Interval H&P Note (Signed)
 History and Physical Interval Note:  10/12/2023 7:13 AM  Andrew FORBES Music Sr.  has presented today for surgery, with the diagnosis of Severe Aortic Stenosis.  The various methods of treatment have been discussed with the patient and family. After consideration of risks, benefits and other options for treatment, the patient has consented to  Procedure(s): Transcatheter Aortic Valve Replacement, Transfemoral (N/A) ECHOCARDIOGRAM, TRANSTHORACIC (N/A) as a surgical intervention.  The patient's history has been reviewed, patient examined, no change in status, stable for surgery.  I have reviewed the patient's chart and labs.  Questions were answered to the patient's satisfaction.     Andrew Malone

## 2023-10-12 NOTE — Discharge Summary (Signed)
 HEART AND VASCULAR CENTER   MULTIDISCIPLINARY HEART VALVE TEAM  Discharge Summary    [name]: Andrew MAFFEI Sr. MRN: [id number]; DOB: [birth year]  Admit date: 10/12/2023 Discharge date: 10/13/2023  PCP:  Garald Karlynn GAILS, MD  [facility] Cardiologist:  Redell Shallow, MD  [facility] Structural heart: Ozell Fell, MD [facility] Electrophysiologist:  None   Discharge Diagnoses    Principal Problem:   S/P TAVR (transcatheter aortic valve replacement) Active Problems:   Dyslipidemia   Essential hypertension   Coronary atherosclerosis   Cerebral artery occlusion with cerebral infarction (HCC)   PVD   GERD   OSA (obstructive sleep apnea)   Severe aortic stenosis   DM II (diabetes mellitus, type II), controlled (HCC)   Anemia   BPH (benign prostatic hyperplasia)   CRI (chronic renal insufficiency), stage 3 (moderate) (HCC)   Allergies Allergies  Allergen Reactions   Empagliflozin  Other (See Comments)    Bleeding in the groin   Lipitor [Atorvastatin ]     Leg weakness, burning - 75% better off Lipitor    Lisinopril  Cough   Spiriva  Respimat [Tiotropium Bromide Monohydrate ] Swelling   Trulicity [Dulaglutide]     Diagnostic Studies/Procedures    TAVR OPERATIVE NOTE     Date of Procedure:                10/12/2023   Preoperative Diagnosis:      Severe Aortic Stenosis    Postoperative Diagnosis:    Same    Procedure:        Transcatheter Aortic Valve Replacement - Percutaneous Transfemoral Approach             Edwards Sapien 3 Ultra Resilia THV (size 26 mm, serial # [id number])              Co-Surgeons:                        Linnie Rayas, MD and Ozell Fell, MD   Anesthesiologist:                  Bernardino Mango, MD   Echocardiographer:              Toney Decent, MD   Pre-operative Echo Findings: Severe aortic stenosis Normal left ventricular systolic function   Post-operative Echo Findings: No paravalvular leak Normal/unchanged  left ventricular systolic function _____________    Echo 10/13/23: completed but pending formal read at the time of discharge   History of Present Illness     Andrew Malone. is a 83 y.o. male with a history of CAD, CKD stage IIIa, carotid artery disease s/p R carotid stenting, HTN, HLD, sleep apnea, prior CVA in 2013 and severe aortic stenosis who presented to [facility] on 10/12/23 for planned TAVR.   Echo 07/23/23 with LVEF=65-70% and moderately severe to severe aortic stenosis with mean gradient 33.4 mmHg, AVA 0.97 cm2, DI 0.28, SVI 46 (visually appeared severely stenosed). Cardiac cath 08/06/23 with moderate non-obstructive CAD and normal right and left heart pressures. Mean aortic valve gradient 29 mmHg by cath with AVA 1.1 cm2. He reported progressive DOE.   The patient was evaluated by the multidisciplinary valve team and felt to have severe, symptomatic aortic stenosis and to be a suitable candidate for TAVR, which was set up for 10/12/23.  Hospital Course     Consultants: none   Severe AS:  -- S/p successful TAVR with a 26 mm Edwards Sapien 3 Ultra  Resilia THV via the TF approach on 10/12/23.  -- Post operative echo completed but pending formal read. -- Groin sites are stable.  -- ECG with sinus and no high grade heart block. -- Continue chronic Asprin 81mg  daily and Plavix  75 mg daily as recommended by neurology.  -- Met with cardiac rehab to discuss CRP phase II.  -- Plan for discharge home today with close follow up in the outpatient setting.   Acute neck swelling and pain: -- CT head and neck with acute findings. -- No swelling on my exam today. -- Continue supportive care with Tylenol  and follow up with PCP.  Hypomagnesemia: -- Supplemented.    CAD: -- Cardiac cath 08/06/23 with moderate non-obstructive CAD. -- Continue aspirin  81mg  daily.  -- Continue pitavistatin 4mg  daily.   DMT2:  -- Treated with SSI while admitted.  -- Resume home meds at discharge. -- Okay to  resume Metformin after 48 hours after contrast dye exposure (8/14 PM)   Hx of CVA: -- Continue chronic Asprin 81mg  daily and Plavix  75 mg daily as recommended by neurology.  -- Continue pitavistatin 4mg  daily.   Carotid artery disease: -- S/p R carotid stenting, -- Serial dopplers followed by Dr. Pietro.  Lymphadenopathy: -- Pre TAVR CT showed small hilar and mediastinal lymph nodes likely reactive in nature. These will be better evaluated on dedicated CT of the chest. -- This will be discussed in the outpatient setting. _____________  Discharge Vitals Blood pressure 132/63, pulse 73, temperature 97.7 F (36.5 C), temperature source Oral, resp. rate 20, height 5' 10.5 (1.791 m), weight 84.7 kg, SpO2 95%.  Filed Weights   10/12/23 0723 10/13/23 0502  Weight: 86.2 kg 84.7 kg     GEN: Well nourished, well developed in no acute distress NECK: No JVD, tender on left side but no swelling. CARDIAC: RRR, 1/6 flow murmur @ RUSB. 3/6 systolic murmur heard best LLSB. No rubs, gallops RESPIRATORY:  Clear to auscultation without rales, wheezing or rhonchi  ABDOMEN: Soft, non-tender, non-distended EXTREMITIES:  No edema; No deformity.  Groin sites clear without hematoma or ecchymosis.    Disposition   Pt is being discharged home today in good condition.  Follow-up Plans & Appointments     Follow-up Information     Sebastian Lamarr SAUNDERS, PA-C. Go on 10/18/2023.   Specialties: Cardiology, Radiology Why: @ 9:30am, please arrive at least 20 minutes early. Contact information: [address] [place]  [address] [phone]                  Discharge Medications   Allergies as of 10/13/2023       Reactions   Empagliflozin  Other (See Comments)   Bleeding in the groin   Lipitor [atorvastatin ]    Leg weakness, burning - 75% better off Lipitor   Lisinopril  Cough   Spiriva  Respimat [tiotropium Bromide Monohydrate ] Swelling   Trulicity [dulaglutide]          Medication List     PAUSE taking these medications    metFORMIN 500 MG 24 hr tablet Wait to take this until: October 14, 2023 Commonly known as: GLUCOPHAGE-XR Take 1,000 mg by mouth daily with supper.       TAKE these medications    acyclovir  200 MG capsule Commonly known as: ZOVIRAX  TAKE 1 CAPSULE BY MOUTH TWICE DAILY   amLODipine  2.5 MG tablet Commonly known as: NORVASC  Take 1 tablet (2.5 mg total) by mouth daily.   ammonium lactate 12 % lotion Commonly known as:  LAC-HYDRIN APPLY TO THE TO THE AFFECTED AREA DAILY   aspirin  EC 81 MG tablet Take 1 tablet (81 mg total) by mouth daily. Swallow whole.   Cholecalciferol  25 MCG (1000 UT) tablet Take 1,000 Units by mouth daily.   citalopram  10 MG tablet Commonly known as: CELEXA  TAKE 1 TABLET(10 MG) BY MOUTH DAILY What changed: See the new instructions.   clopidogrel  75 MG tablet Commonly known as: PLAVIX  TAKE 1 TABLET(75 MG) BY MOUTH DAILY   ezetimibe  10 MG tablet Commonly known as: ZETIA  TAKE 1 TABLET(10 MG) BY MOUTH DAILY   loperamide 2 MG capsule Commonly known as: IMODIUM Take 2 mg by mouth daily as needed (Diarrhea).   metoprolol  succinate 25 MG 24 hr tablet Commonly known as: TOPROL -XL TAKE 1 TABLET(25 MG) BY MOUTH DAILY What changed: See the new instructions.   nitroGLYCERIN  0.4 MG SL tablet Commonly known as: NITROSTAT  Place 1 tablet (0.4 mg total) under the tongue every 5 (five) minutes x 3 doses as needed for chest pain.   OneTouch Verio test strip Generic drug: glucose blood check blood sugar In Vitro once a day DX: E11.42 for 90 days   pantoprazole  40 MG tablet Commonly known as: PROTONIX  TAKE 1 TABLET BY MOUTH EVERY DAY   predniSONE  5 MG tablet Commonly known as: DELTASONE  Take 1 tablet (5 mg total) by mouth daily with breakfast.   tamsulosin  0.4 MG Caps capsule Commonly known as: FLOMAX  TAKE 1 CAPSULE(0.4 MG) BY MOUTH AT BEDTIME   tolterodine  2 MG tablet Commonly known as:  Detrol  Take 1 tablet (2 mg total) by mouth 2 (two) times daily.   Vitamin B-12 1000 MCG Subl Place 1 tablet (1,000 mcg total) under the tongue daily.   Zypitamag  4 MG Tabs Generic drug: Pitavastatin  Magnesium  TAKE 1 TABLET BY MOUTH EVERY DAY         Outstanding Labs/Studies   none  ______________________  Duration of Discharge Encounter: APP Time: 25 minutes MD Time: 26 minutes    Signed, Lamarr Hummer, PA-C 10/13/2023, 9:13 AM [phone]

## 2023-10-12 NOTE — Op Note (Signed)
 HEART AND VASCULAR CENTER   MULTIDISCIPLINARY HEART VALVE TEAM   TAVR OPERATIVE NOTE   Date of Procedure:  10/12/2023  Preoperative Diagnosis: Severe Aortic Stenosis   Postoperative Diagnosis: Same   Procedure:   Transcatheter Aortic Valve Replacement - Percutaneous Transfemoral Approach  Edwards Sapien 3 Ultra Resilia THV (size 26 mm, serial # [id number])   Co-Surgeons:  Linnie Rayas, MD and Ozell Fell, MD  Anesthesiologist:  Bernardino Mango, MD  Echocardiographer:  Toney Decent, MD  Pre-operative Echo Findings: Severe aortic stenosis Normal left ventricular systolic function  Post-operative Echo Findings: No paravalvular leak Normal/unchanged left ventricular systolic function  BRIEF CLINICAL NOTE AND INDICATIONS FOR SURGERY  83 year old male with progressive symptoms of NYHA functional class II dyspnea and fatigue consistent with chronic diastolic heart failure.  He has been demonstrated suffer from severe paradoxical low-flow low gradient aortic stenosis and presents today for TAVR after undergoing appropriate preoperative imaging studies and multidisciplinary heart team review of his case.  During the course of the patient's preoperative work up they have been evaluated comprehensively by a multidisciplinary team of specialists coordinated through the Multidisciplinary Heart Valve Clinic in the [facility].  They have been demonstrated to suffer from symptomatic severe aortic stenosis as noted above. The patient has been counseled extensively as to the relative risks and benefits of all options for the treatment of severe aortic stenosis including long term medical therapy, conventional surgery for aortic valve replacement, and transcatheter aortic valve replacement.  The patient has been independently evaluated in formal cardiac surgical consultation by Dr Rayas, who deemed the patient appropriate for TAVR. Based upon review of all of the  patient's preoperative diagnostic tests they are felt to be candidate for transcatheter aortic valve replacement using the transfemoral approach as an alternative to conventional surgery.    Following the decision to proceed with transcatheter aortic valve replacement, a discussion has been held regarding what types of management strategies would be attempted intraoperatively in the event of life-threatening complications, including whether or not the patient would be considered a candidate for the use of cardiopulmonary bypass and/or conversion to open sternotomy for attempted surgical intervention.  The patient has been advised of a variety of complications that might develop peculiar to this approach including but not limited to risks of death, stroke, paravalvular leak, aortic dissection or other major vascular complications, aortic annulus rupture, device embolization, cardiac rupture or perforation, acute myocardial infarction, arrhythmia, heart block or bradycardia requiring permanent pacemaker placement, congestive heart failure, respiratory failure, renal failure, pneumonia, infection, other late complications related to structural valve deterioration or migration, or other complications that might ultimately cause a temporary or permanent loss of functional independence or other long term morbidity.  The patient provides full informed consent for the procedure as described and all questions were answered preoperatively.  DETAILS OF THE OPERATIVE PROCEDURE  PREPARATION:   The patient is brought to the operating room on the above mentioned date and central monitoring was established by the anesthesia team including placement of a radial arterial line. The patient is placed in the supine position on the operating table.  Intravenous antibiotics are administered. The patient is monitored closely throughout the procedure under conscious sedation.  Baseline transthoracic echocardiogram is performed. The  patient's chest, abdomen, both groins, and both lower extremities are prepared and draped in a sterile manner. A time out procedure is performed.   PERIPHERAL ACCESS:   Using ultrasound guidance, femoral arterial and venous access is obtained  with placement of 6 Fr sheaths on the left side.  US  images are digitally captured and stored in the patient's chart. A pigtail diagnostic catheter was passed through the femoral arterial sheath under fluoroscopic guidance into the aortic root.  A temporary transvenous pacemaker catheter was passed through the femoral venous sheath under fluoroscopic guidance into the right ventricle.  The pacemaker was tested to ensure stable lead placement and pacemaker capture. Aortic root angiography was performed in order to determine the optimal angiographic angle for valve deployment.  TRANSFEMORAL ACCESS:  A micropuncture technique is used to access the right femoral artery under fluoroscopic and ultrasound guidance.  2 Perclose devices are deployed at 10' and 2' positions to 'PreClose' the femoral artery. An 8 French sheath is placed and then an Amplatz Superstiff wire is advanced through the sheath. This is changed out for a 14 French transfemoral E-Sheath after progressively dilating over the Superstiff wire.  An AL-2 catheter was used to direct a straight-tip exchange length wire across the native aortic valve into the left ventricle. This was exchanged out for a pigtail catheter and position was confirmed in the LV apex. Simultaneous LV and Ao pressures were recorded.  The pigtail catheter was exchanged for a Safari wire in the LV apex.    BALLOON AORTIC VALVULOPLASTY:  Not performed  TRANSCATHETER HEART VALVE DEPLOYMENT:  An Edwards Sapien 3 Ultra Resilia transcatheter heart valve (size 26 mm) was prepared and crimped per manufacturer's guidelines, and the proper orientation of the valve is confirmed on the [employer] delivery system. The valve was advanced  through the introducer sheath using normal technique until in an appropriate position in the abdominal aorta beyond the sheath tip. The balloon was then retracted and using the fine-tuning wheel was centered on the valve. The valve was then advanced across the aortic arch using appropriate flexion of the catheter. The valve was carefully positioned across the aortic valve annulus. The Commander catheter was retracted using normal technique. Once final position of the valve has been confirmed by angiographic assessment, the valve is deployed while temporarily holding ventilation and during rapid ventricular pacing to maintain systolic blood pressure < 50 mmHg and pulse pressure < 10 mmHg. The balloon inflation is held for >3 seconds after reaching full deployment volume. Once the balloon has fully deflated the balloon is retracted into the ascending aorta and valve function is assessed using echocardiography. The patient's hemodynamic recovery following valve deployment is good.  The deployment balloon and guidewire are both removed. Echo demostrated acceptable post-procedural gradients, stable mitral valve function, and no aortic insufficiency.    PROCEDURE COMPLETION:  The sheath was removed and femoral artery closure is performed using the 2 previously deployed Perclose devices.  Protamine  is administered once femoral arterial repair was complete. The site is clear with no evidence of bleeding or hematoma after the sutures are tightened. The temporary pacemaker and pigtail catheters are removed. Mynx closure is used for contralateral femoral arterial hemostasis for the 6 Fr sheath.  The patient tolerated the procedure well and is transported to the recovery area in stable condition. There were no immediate intraoperative complications. All sponge instrument and needle counts are verified correct at completion of the operation.   The patient received a total of 75 mL of intravenous contrast during the  procedure.  EBL: minimal  LVEDP: 14 mmHg  Postop mean gradient 7 mmHg   Ozell Fell, MD 10/12/2023 11:16 AM

## 2023-10-12 NOTE — Transfer of Care (Signed)
 Immediate Anesthesia Transfer of Care Note  Patient: Andrew BOODY Sr.  Procedure(s) Performed: Transcatheter Aortic Valve Replacement, Transfemoral ECHOCARDIOGRAM, TRANSTHORACIC  Patient Location: Cath Lab  Anesthesia Type:MAC  Level of Consciousness: awake, alert , and oriented  Airway & Oxygen Therapy: Patient Spontanous Breathing  Post-op Assessment: Report given to RN, Post -op Vital signs reviewed and stable, Patient moving all extremities X 4, and Patient able to stick tongue midline  Post vital signs: Reviewed and stable  Last Vitals:  Vitals Value Taken Time  BP 117/62 10/12/23 11:16  Temp 97.8   Pulse 58 10/12/23 11:17  Resp 17 10/12/23 11:17  SpO2 100 % 10/12/23 11:17  Vitals shown include unfiled device data.  Last Pain:  Vitals:   10/12/23 1103  TempSrc:   PainSc: Asleep         Complications: There were no known notable events for this encounter.

## 2023-10-12 NOTE — Progress Notes (Addendum)
  HEART AND VASCULAR CENTER   MULTIDISCIPLINARY HEART VALVE TEAM  Patient doing well s/p TAVR. She is hemodynamically stable. Groin sites stable. ECG with no high grade block, but new RBBB? Plan to transfer to from cath lab holding to 4E when bed available. Early ambulation after bedrest completed and hopeful discharge over the next 24-48 hours.   Called back later with elevated BPs. Will resume home Norvasc  but at 5mg  instead of 2.5mg . Hold home Torpol with HR in 50s.  Lamarr Hummer PA-C  [facility]  Pager [phone]

## 2023-10-12 NOTE — Progress Notes (Signed)
 Pt given norvasc  for elevating bps. No decrease in bp after an hour. Started ntg gtt per order for bp control. Reported to PM RN to continue to monitor ntg parameters. Pt resting in bed with no issues. Wife at bedside and aware that nitroglycerin  would continue to be slowly increased to control blood pressure. Ann Nena Hoard, RN

## 2023-10-12 NOTE — Discharge Instructions (Signed)
 ACTIVITY AND EXERCISE  Daily activity and exercise are an important part of your recovery. People recover at different rates depending on their general health and type of valve procedure.  Most people recovering from TAVR feel better relatively quickly   No lifting, pushing, pulling more than 10 pounds (examples to avoid: groceries, vacuuming, gardening, golfing):             - For one week with a procedure through the groin.             - For six weeks for procedures through the chest wall or neck. NOTE: You will typically see one of our providers 7-14 days after your procedure to discuss WHEN TO RESUME the above activities.      DRIVING  Do not drive until you are seen for follow up and cleared by a provider. Generally, we ask patient to not drive for 1 week after their procedure.  If you have been told by your doctor in the past that you may not drive, you must talk with him/her before you begin driving again.   DRESSING  Groin site: you may leave the clear dressing over the site for up to one week or until it falls off.   HYGIENE  If you had a femoral (leg) procedure, you may take a shower when you return home. After the shower, pat the site dry. Do NOT use powder, oils or lotions in your groin area until the site has completely healed.  If you had a chest procedure, you may shower when you return home unless specifically instructed not to by your discharging practitioner.             - DO NOT scrub incision; pat dry with a towel.             - DO NOT apply any lotions, oils, powders to the incision.             - No tub baths / swimming for at least 2 weeks.  If you notice any fevers, chills, increased pain, swelling, bleeding or pus, please contact your doctor.   ADDITIONAL INFORMATION  If you are going to have an upcoming dental procedure, please contact our office as you will require antibiotics ahead of time to prevent infection on your heart valve.    If you have any questions  or concerns you can call the structural heart phone during normal business hours 8am-4pm. If you have an urgent need after hours or weekends please call [phone] to talk to the on call provider for general cardiology. If you have an emergency that requires immediate attention, please call 911.    After TAVR Checklist  Check  Test Description   Follow up appointment in 1-2 weeks  You will see our structural heart advanced practice provider. Your incision sites will be checked and you will be cleared to drive and resume all normal activities if you are doing well.     1 month echo and follow up  You will have an echo to check on your new heart valve and be seen back in the office by a structural heart advanced practice provider.   Follow up with your primary cardiologist You will need to be seen by your primary cardiologist in the following 3-6 months after your 1 month appointment in the valve clinic.    1 year echo and follow up You will have another echo to check on your heart valve after 1 year  and be seen back in the office by a structural heart advanced practice provider. This your last structural heart visit.   Bacterial endocarditis prophylaxis  You will have to take antibiotics for the rest of your life before all dental procedures (even teeth cleanings) to protect your heart valve. Antibiotics are also required before some surgeries. Please check with your cardiologist before scheduling any surgeries. Also, please make sure to tell us  if you have a penicillin allergy as you will require an alternative antibiotic.

## 2023-10-13 ENCOUNTER — Inpatient Hospital Stay (HOSPITAL_COMMUNITY)

## 2023-10-13 ENCOUNTER — Other Ambulatory Visit (HOSPITAL_COMMUNITY): Payer: Self-pay

## 2023-10-13 DIAGNOSIS — Z952 Presence of prosthetic heart valve: Secondary | ICD-10-CM

## 2023-10-13 LAB — BASIC METABOLIC PANEL WITH GFR
Anion gap: 12 (ref 5–15)
BUN: 18 mg/dL (ref 8–23)
CO2: 20 mmol/L — ABNORMAL LOW (ref 22–32)
Calcium: 8.9 mg/dL (ref 8.9–10.3)
Chloride: 105 mmol/L (ref 98–111)
Creatinine, Ser: 1.17 mg/dL (ref 0.61–1.24)
GFR, Estimated: >60 mL/min (ref >60)
Glucose, Bld: 197 mg/dL — ABNORMAL HIGH (ref 70–99)
Potassium: 3.7 mmol/L (ref 3.5–5.1)
Sodium: 137 mmol/L (ref 135–145)

## 2023-10-13 LAB — CBC
HCT: 33 % — ABNORMAL LOW (ref 39.0–52.0)
Hemoglobin: 11.1 g/dL — ABNORMAL LOW (ref 13.0–17.0)
MCH: 30.4 pg (ref 26.0–34.0)
MCHC: 33.6 g/dL (ref 30.0–36.0)
MCV: 90.4 fL (ref 80.0–100.0)
Platelets: 158 K/uL (ref 150–400)
RBC: 3.65 MIL/uL — ABNORMAL LOW (ref 4.22–5.81)
RDW: 12.6 % (ref 11.5–15.5)
WBC: 7.9 K/uL (ref 4.0–10.5)
nRBC: 0 % (ref 0.0–0.2)

## 2023-10-13 LAB — MAGNESIUM: Magnesium: 1.5 mg/dL — ABNORMAL LOW (ref 1.7–2.4)

## 2023-10-13 MED ORDER — PRAVASTATIN SODIUM 40 MG PO TABS: 80.0000 mg | ORAL_TABLET | Freq: Every day | ORAL | Status: DC

## 2023-10-13 MED ORDER — PROCHLORPERAZINE EDISYLATE 10 MG/2ML IJ SOLN
10.0000 mg | Freq: Once | INTRAMUSCULAR | Status: AC
  Administered 2023-10-13 (×2): 10 mg via INTRAVENOUS
  Filled 2023-10-13: qty 2

## 2023-10-13 MED ORDER — HYDROMORPHONE HCL 1 MG/ML IJ SOLN
1.0000 mg | Freq: Once | INTRAMUSCULAR | Status: AC
  Administered 2023-10-13 (×2): 1 mg via INTRAVENOUS
  Filled 2023-10-13: qty 1

## 2023-10-13 MED ORDER — ACETAMINOPHEN 325 MG PO TABS
650.0000 mg | ORAL_TABLET | Freq: Four times a day (QID) | ORAL | Status: DC
  Administered 2023-10-13 (×2): 650 mg via ORAL
  Filled 2023-10-13: qty 2

## 2023-10-13 MED ORDER — MAGNESIUM SULFATE 50 % IJ SOLN: 1.0000 g | Freq: Once | INTRAMUSCULAR | Status: DC

## 2023-10-13 MED ORDER — FENTANYL CITRATE PF 50 MCG/ML IJ SOSY
25.0000 ug | PREFILLED_SYRINGE | Freq: Once | INTRAMUSCULAR | Status: AC
  Administered 2023-10-13 (×2): 25 ug via INTRAVENOUS
  Filled 2023-10-13: qty 1

## 2023-10-13 MED ORDER — MAGNESIUM SULFATE IN D5W 1-5 GM/100ML-% IV SOLN
1.0000 g | Freq: Once | INTRAVENOUS | Status: AC
  Administered 2023-10-13 (×2): 1 g via INTRAVENOUS
  Filled 2023-10-13: qty 100

## 2023-10-13 NOTE — Progress Notes (Signed)
 Pt walked in the hallway prior to my arrival. Pt walked well w/o unusual signs and symptoms. Pt was educated on restrictions, HH diet, ex guidelines, and CR at [facility]. Pt will be referred there.  Garen FORBES Candy MS, ACSM-CEP 10/13/2023 9:44 AM

## 2023-10-13 NOTE — Plan of Care (Signed)
 ?  Problem: Clinical Measurements: ?Goal: Will remain free from infection ?Outcome: Progressing ?  ?

## 2023-10-13 NOTE — Progress Notes (Signed)
 Another episode of emesis. Notified MD. New orders given. No change in pain

## 2023-10-13 NOTE — Plan of Care (Signed)

## 2023-10-13 NOTE — Progress Notes (Signed)
 Patient started to complain of left neck area with concern for swelling. Pain was severe and non-responsive to several doses of opioids initially. Patient and wife were also concerned for swelling of the left neck. On examination, the left and posterior neck were tender; the left sternocleidomastoid muscle was tense but no obvious swelling. Given persistent severe pain despite opioids, CT was ordered and showed no acute findings.

## 2023-10-13 NOTE — Progress Notes (Signed)
 Placed patient on 2L O2 to maintain sats>92%

## 2023-10-13 NOTE — Progress Notes (Signed)
   10/13/23 1152  TOC Brief Assessment  Insurance and Status Reviewed  Patient has primary care physician Yes  Home environment has been reviewed home w/ spouse  Prior level of function: self  Prior/Current Home Services No current home services  Social Drivers of Health Review SDOH reviewed no interventions necessary  Readmission risk has been reviewed Yes  Transition of care needs no transition of care needs at this time     Pt s/p TAVR, stable for transition home today, no HH or DME needs noted.

## 2023-10-13 NOTE — Progress Notes (Signed)
 Patient having increasing pain in his posterior neck. Tramadol  given. Wife called this RN to let her know that the pain in his neck was moving to side of left neck. Pain 10 out of 10.   Called cardiology to inform him of this issue. MD to bedside. Orders placed. Lidoderm  placed on left side of neck to posteriorly. 2 mg morphine  given once.   Will continue to monitor

## 2023-10-13 NOTE — Progress Notes (Signed)
 Explained discharge instructions to patient. Reviewed follow up appointment and next medication administration times. Also reviewed education. Patient verbalized having an understanding for instructions given. All belongings are in the patient's possession. Patient's RN removed IV and telemetry.  No other needs verbalized. Ride is at the bedside. Volunteer services will transport downstairs for discharge.

## 2023-10-14 ENCOUNTER — Telehealth: Payer: Self-pay

## 2023-10-14 ENCOUNTER — Ambulatory Visit: Admitting: Cardiology

## 2023-10-14 ENCOUNTER — Telehealth: Payer: Self-pay | Admitting: *Deleted

## 2023-10-14 LAB — ECHOCARDIOGRAM COMPLETE
AV Mean grad: 8 mmHg
AV Peak grad: 15.5 mmHg
Ao pk vel: 1.97 m/s
Area-P 1/2: 2.61 cm2
Height: 70.5 in
S' Lateral: 2.3 cm
Weight: 2987.67 [oz_av]

## 2023-10-14 MED ORDER — AMLODIPINE BESYLATE 2.5 MG PO TABS: 5.0000 mg | ORAL_TABLET | Freq: Every day | ORAL | Status: DC

## 2023-10-14 NOTE — Telephone Encounter (Signed)
 Patient contacted regarding discharge from [facility] on 10/13/23  Patient understands to follow up with provider Izetta Hummer, PA-C on 10/18/23 at 9:30AM at  [address] Location. Patient understands discharge instructions? Yes Patient understands medications and regiment? Yes Patient understands to bring all medications to this visit? Yes  While on the phone with the patient's wife, she reported that even after taking his amlodipine  2.5mg  this AM his BP was 144/70. Pt wife said that his amlodipine  was decreased prior to TAVR due to a low BP, but now she would like to know if she should increase his amlodipine  back to 5mg  daily. Thanks!

## 2023-10-14 NOTE — Transitions of Care (Post Inpatient/ED Visit) (Signed)
 10/14/2023  Name: Andrew AGRUSA Sr. MRN: [id number] DOB: [birth year]  Today's TOC FU Call Status: Today's TOC FU Call Status:: Successful TOC FU Call Completed TOC FU Call Complete Date: 10/14/23 Patient's Name and Date of Birth confirmed.  Transition Care Management Follow-up Telephone Call Date of Discharge: 10/13/23 Discharge Facility: [facility]) Type of Discharge: Inpatient Admission Primary Inpatient Discharge Diagnosis:: Planned surgical TAVR How have you been since you were released from the hospital?: Better (per spouse/ caregiver: he is doing great; I just got off the phone with the heart surgeon group. They answered all of our questions and we will be going to the appointments as scheduled next week) Any questions or concerns?: No  Items Reviewed: Did you receive and understand the discharge instructions provided?: Yes (thoroughly reviewed with patient's spouse who verbalizes good understanding of same) Medications obtained,verified, and reconciled?: Partial Review Completed (confirmed patient was not prescribed new medications with planned surgery; self-manages medications and denies questions/ concerns around medications today - states questions were answered during cardiology TOC call earlier today) Reason for Partial Mediation Review: Patient's spouse declined: she is just getting out of shower and reports she has just reviewed all medications with the cardiology provider during their [facility] call earlier today Any new allergies since your discharge?: No Dietary orders reviewed?: Yes Type of Diet Ordered:: Heart Healthy diet per spouse Do you have support at home?: Yes People in Home [RPT]: spouse Name of Support/Comfort Primary Source: Reports independent in self-care activities; resides with supportive spouse [name]-- assists as/ if needed/ indicated  Medications Reviewed Today: Medications Reviewed Today     Reviewed by Tyla Burgner M, RN (Registered Nurse) on  10/14/23 at 1102  Med List Status: <None>   Medication Order Taking? Sig Documenting Provider Last Dose Status Informant  acyclovir  (ZOVIRAX ) 200 MG capsule [id number]  TAKE 1 CAPSULE BY MOUTH TWICE DAILY Plotnikov, Aleksei V, MD  Active Spouse/Significant Other  amLODipine  (NORVASC ) 2.5 MG tablet [id number]  Take 2 tablets (5 mg total) by mouth daily. Sebastian Lamarr SAUNDERS, PA-C  Active   ammonium lactate (LAC-HYDRIN) 12 % lotion [id number]  APPLY TO THE TO THE AFFECTED AREA DAILY Plotnikov, Aleksei V, MD  Active Spouse/Significant Other  aspirin  EC 81 MG tablet [id number]  Take 1 tablet (81 mg total) by mouth daily. Swallow whole. Vicci Rollo SAUNDERS, PA-C  Active Spouse/Significant Other  Cholecalciferol  1000 units tablet [id number]  Take 1,000 Units by mouth daily. [provider]  Active Spouse/Significant Other  citalopram  (CELEXA ) 10 MG tablet [id number]  TAKE 1 TABLET(10 MG) BY MOUTH DAILY  Patient taking differently: Take 10 mg by mouth at bedtime.   Plotnikov, Aleksei V, MD  Active Spouse/Significant Other  clopidogrel  (PLAVIX ) 75 MG tablet [id number]  TAKE 1 TABLET(75 MG) BY MOUTH DAILY Plotnikov, Aleksei V, MD  Active Spouse/Significant Other  Cyanocobalamin  (VITAMIN B-12) 1000 MCG SUBL [id number]  Place 1 tablet (1,000 mcg total) under the tongue daily. Plotnikov, Karlynn GAILS, MD  Active Spouse/Significant Other  ezetimibe  (ZETIA ) 10 MG tablet [id number]  TAKE 1 TABLET(10 MG) BY MOUTH DAILY Pietro, Redell RAMAN, MD  Active Spouse/Significant Other  glucose blood (ONETOUCH VERIO) test strip [id number]  check blood sugar In Vitro once a day DX: E11.42 for 90 days [provider]  Active Spouse/Significant Other  loperamide (IMODIUM) 2 MG capsule [id number]  Take 2 mg by mouth daily as needed (Diarrhea). [provider]  Active Spouse/Significant Other  metFORMIN (GLUCOPHAGE-XR) 500 MG 24 hr tablet [id number]  Take 1,000 mg by mouth daily with supper.  [provider]   Active Spouse/Significant Other           Med Note BIRDIA ANTON HERO   Tue Apr 28, 2016  2:18 PM)    metoprolol  succinate (TOPROL -XL) 25 MG 24 hr tablet [id number]  TAKE 1 TABLET(25 MG) BY MOUTH DAILY  Patient taking differently: Take 25 mg by mouth at bedtime.   Pietro Redell RAMAN, MD  Active Spouse/Significant Other  nitroGLYCERIN  (NITROSTAT ) 0.4 MG SL tablet [id number]  Place 1 tablet (0.4 mg total) under the tongue every 5 (five) minutes x 3 doses as needed for chest pain. Vicci Rollo SAUNDERS, PA-C  Active Spouse/Significant Other           Med Note SOILA LYLE BROCKS   Wed Oct 06, 2023 12:00 PM) Has on hand but has never used before  pantoprazole  (PROTONIX ) 40 MG tablet [id number]  TAKE 1 TABLET BY MOUTH EVERY DAY Plotnikov, Aleksei V, MD  Active Spouse/Significant Other  predniSONE  (DELTASONE ) 5 MG tablet [id number]  Take 1 tablet (5 mg total) by mouth daily with breakfast. Plotnikov, Aleksei V, MD  Active Spouse/Significant Other  tamsulosin  (FLOMAX ) 0.4 MG CAPS capsule [id number]  TAKE 1 CAPSULE(0.4 MG) BY MOUTH AT BEDTIME Matilda Senior, MD  Active Spouse/Significant Other  tolterodine  (DETROL ) 2 MG tablet [id number]  Take 1 tablet (2 mg total) by mouth 2 (two) times daily. Plotnikov, Karlynn GAILS, MD  Active Spouse/Significant Other  ZYPITAMAG  4 MG TABS [id number]  TAKE 1 TABLET BY MOUTH EVERY DAY Plotnikov, Aleksei V, MD  Active Spouse/Significant Other           Med Note SOILA, LYLE BROCKS   Wed Oct 06, 2023 12:03 PM)             Home Care and Equipment/Supplies: Were Home Health Services Ordered?: No Any new equipment or medical supplies ordered?: No  Functional Questionnaire: Do you need assistance with bathing/showering or dressing?: No Do you need assistance with meal preparation?: No Do you need assistance with eating?: No Do you have difficulty maintaining continence: No Do you need assistance with getting out of bed/getting out of a chair/moving?: No Do you have difficulty  managing or taking your medications?: No  Follow up appointments reviewed: PCP Follow-up appointment confirmed?: Yes Date of PCP follow-up appointment?: 10/20/23 Follow-up Provider: PCP- routine visit- was already scheduled prior to planned surgery Specialist Hospital Follow-up appointment confirmed?: Yes Date of Specialist follow-up appointment?: 10/18/23 Follow-Up Specialty Provider:: cardiology surgical provider Do you need transportation to your follow-up appointment?: No Do you understand care options if your condition(s) worsen?: Yes-patient verbalized understanding  SDOH Interventions Today    Flowsheet Row Most Recent Value  SDOH Interventions   Food Insecurity Interventions Intervention Not Indicated  Housing Interventions Intervention Not Indicated  Transportation Interventions Intervention Not Indicated  [drives self- spouse assists as/ if indicated]  Utilities Interventions Intervention Not Indicated   See TOC assessment tabs for additional assessment/ TOC intervention information  Patient declines need for ongoing/ further care management/ coordination outreach; declines enrollment in 30-day TOC program- declines taking my direct phone number should needs/ concerns arise post-TOC call   Pls call/ message for questions,  Donny Heffern Mckinney Delmar Arriaga, RN, BSN, Media planner  Transitions of Care  [facility] Population [facility]  [place] [phone]: direct office

## 2023-10-14 NOTE — Addendum Note (Signed)
 Addended by: IZETTA CONCETTA KIDD on: 10/14/2023 10:47 AM   Modules accepted: Orders

## 2023-10-14 NOTE — Telephone Encounter (Signed)
 Spoke with pt wife to let her know that pt can increase Amlodipine  to 5mg  daily. Updated in pt meds.
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
## 2023-10-18 ENCOUNTER — Ambulatory Visit: Attending: Physician Assistant | Admitting: Physician Assistant

## 2023-10-18 ENCOUNTER — Telehealth: Payer: Self-pay | Admitting: Cardiology

## 2023-10-18 VITALS — BP 126/72 | HR 77 | Ht 70.5 in | Wt 185.8 lb

## 2023-10-18 DIAGNOSIS — R591 Generalized enlarged lymph nodes: Secondary | ICD-10-CM | POA: Diagnosis not present

## 2023-10-18 DIAGNOSIS — M542 Cervicalgia: Secondary | ICD-10-CM

## 2023-10-18 DIAGNOSIS — I639 Cerebral infarction, unspecified: Secondary | ICD-10-CM

## 2023-10-18 DIAGNOSIS — I2510 Atherosclerotic heart disease of native coronary artery without angina pectoris: Secondary | ICD-10-CM | POA: Diagnosis not present

## 2023-10-18 DIAGNOSIS — Z952 Presence of prosthetic heart valve: Secondary | ICD-10-CM

## 2023-10-18 DIAGNOSIS — I6350 Cerebral infarction due to unspecified occlusion or stenosis of unspecified cerebral artery: Secondary | ICD-10-CM

## 2023-10-18 DIAGNOSIS — I10 Essential (primary) hypertension: Secondary | ICD-10-CM

## 2023-10-18 MED ORDER — AMLODIPINE BESYLATE 5 MG PO TABS: 5.0000 mg | ORAL_TABLET | Freq: Every day | ORAL | 3 refills | Status: AC

## 2023-10-18 MED ORDER — AMOXICILLIN 500 MG PO TABS
2000.0000 mg | ORAL_TABLET | ORAL | 12 refills | Status: AC
Start: 2023-10-18 — End: ?

## 2023-10-18 NOTE — Telephone Encounter (Signed)
 Called patient to reschedule carotid. Unclear if its still needed or if it was done the same time as CT. Requesting callback to discuss before rescheduling.

## 2023-10-18 NOTE — Telephone Encounter (Signed)
 Spoke with pt, aware carotid not needed at this time.

## 2023-10-18 NOTE — Patient Instructions (Signed)
 Medication Instructions:  Increase amlodpine from 2.5mg  to 5mg  daily and continue to watch BP.   An antibiotic has been called into your pharmacy to take as needed for dental work. Please follow instructions on the bottle.   *If you need a refill on your cardiac medications before your next appointment, please call your pharmacy*  Lab Work: none If you have labs (blood work) drawn today and your tests are completely normal, you will receive your results only by: MyChart Message (if you have MyChart) OR A paper copy in the mail If you have any lab test that is abnormal or we need to change your treatment, we will call you to review the results.  Testing/Procedures: none  Follow-Up: At [facility], you and your health needs are our priority.  As part of our continuing mission to provide you with exceptional heart care, our providers are all part of one team.  This team includes your primary Cardiologist (physician) and Advanced Practice Providers or APPs (Physician Assistants and Nurse Practitioners) who all work together to provide you with the care you need, when you need it.  Your next appointment:    Please keep your follow up as scheduled.   Please talk to Dr. Jackee about the enlarged lymph nodes on your pre TAVR CTs and see if he thinks a follow up CT is necessary.

## 2023-10-20 ENCOUNTER — Ambulatory Visit: Admitting: Internal Medicine

## 2023-10-20 ENCOUNTER — Encounter: Payer: Self-pay | Admitting: Internal Medicine

## 2023-10-20 VITALS — Ht 70.5 in

## 2023-10-20 DIAGNOSIS — E1142 Type 2 diabetes mellitus with diabetic polyneuropathy: Secondary | ICD-10-CM

## 2023-10-20 DIAGNOSIS — R590 Localized enlarged lymph nodes: Secondary | ICD-10-CM

## 2023-10-20 DIAGNOSIS — Z7984 Long term (current) use of oral hypoglycemic drugs: Secondary | ICD-10-CM

## 2023-10-20 DIAGNOSIS — E1122 Type 2 diabetes mellitus with diabetic chronic kidney disease: Secondary | ICD-10-CM

## 2023-10-20 DIAGNOSIS — N183 Chronic kidney disease, stage 3 unspecified: Secondary | ICD-10-CM | POA: Diagnosis not present

## 2023-10-20 DIAGNOSIS — Z952 Presence of prosthetic heart valve: Secondary | ICD-10-CM | POA: Diagnosis not present

## 2023-10-20 DIAGNOSIS — E538 Deficiency of other specified B group vitamins: Secondary | ICD-10-CM | POA: Diagnosis not present

## 2023-10-20 DIAGNOSIS — E1159 Type 2 diabetes mellitus with other circulatory complications: Secondary | ICD-10-CM

## 2023-10-20 MED ORDER — ACYCLOVIR 200 MG PO CAPS: 200.0000 mg | ORAL_CAPSULE | Freq: Two times a day (BID) | ORAL | 3 refills | Status: AC

## 2023-10-20 MED ORDER — PANTOPRAZOLE SODIUM 40 MG PO TBEC: DELAYED_RELEASE_TABLET | ORAL | 3 refills | Status: AC

## 2023-10-20 NOTE — Assessment & Plan Note (Signed)
Hydrate well Off HCTZ

## 2023-10-20 NOTE — Assessment & Plan Note (Signed)
On Metformin.  Check A1c 

## 2023-10-20 NOTE — Assessment & Plan Note (Addendum)
 08/2023 TAVR CT w/Prominent mediastinal and hilar lymph nodes. Small hiatal hernia. Thyroid  gland, trachea, and esophagus demonstrate no significant findings. Repeat CT in 6 mo

## 2023-10-20 NOTE — Assessment & Plan Note (Signed)
 On B12

## 2023-10-20 NOTE — Assessment & Plan Note (Signed)
Monitor A1c 

## 2023-10-20 NOTE — Assessment & Plan Note (Signed)
 Doing well DOE is much better

## 2023-10-20 NOTE — Progress Notes (Signed)
 Subjective:  [name]: Andrew FORBES Koleen Chrystal., male    DOB: [birth year]  Age: 83 y.o. MRN: [id number]  CC: Medical Management of Chronic Issues (3 mnth f/u )   HPI Andrew BERGER Sr. presents for post-TAVR C/o adenopathy on chest CT DOE is better  Per hx: Admit date: 10/12/2023 Discharge date: 10/13/2023   PCP:  Garald Karlynn GAILS, MD           [facility] Cardiologist:  Redell Shallow, MD  [facility] Structural heart: Ozell Fell, MD [facility] Electrophysiologist:  None    Discharge Diagnoses    Principal Problem:   S/P TAVR (transcatheter aortic valve replacement) Active Problems:   Dyslipidemia   Essential hypertension   Coronary atherosclerosis   Cerebral artery occlusion with cerebral infarction (HCC)   PVD   GERD   OSA (obstructive sleep apnea)   Severe aortic stenosis   DM II (diabetes mellitus, type II), controlled (HCC)   Anemia   BPH (benign prostatic hyperplasia)   CRI (chronic renal insufficiency), stage 3 (moderate) (HCC)     Allergies Allergies       Allergies  Allergen Reactions   Empagliflozin  Other (See Comments)      Bleeding in the groin   Lipitor [Atorvastatin ]        Leg weakness, burning - 75% better off Lipitor     Lisinopril  Cough   Spiriva  Respimat [Tiotropium Bromide Monohydrate ] Swelling   Trulicity [Dulaglutide]          Diagnostic Studies/Procedures    TAVR OPERATIVE NOTE     Date of Procedure:                10/12/2023   Preoperative Diagnosis:      Severe Aortic Stenosis    Postoperative Diagnosis:    Same    Procedure:        Transcatheter Aortic Valve Replacement - Percutaneous Transfemoral Approach             Edwards Sapien 3 Ultra Resilia THV (size 26 mm, serial # [id number])              Co-Surgeons:                        Linnie Rayas, MD and Ozell Fell, MD   Anesthesiologist:                  Bernardino Mango, MD   Echocardiographer:              Toney Decent, MD   Pre-operative Echo  Findings: Severe aortic stenosis Normal left ventricular systolic function   Post-operative Echo Findings: No paravalvular leak Normal/unchanged left ventricular systolic function _____________     Echo 10/13/23: completed but pending formal read at the time of discharge    History of Present Illness     Andrew Mcwhirter. is a 83 y.o. male with a history of CAD, CKD stage IIIa, carotid artery disease s/p R carotid stenting, HTN, HLD, sleep apnea, prior CVA in 2013 and severe aortic stenosis who presented to [facility] on 10/12/23 for planned TAVR.    Echo 07/23/23 with LVEF=65-70% and moderately severe to severe aortic stenosis with mean gradient 33.4 mmHg, AVA 0.97 cm2, DI 0.28, SVI 46 (visually appeared severely stenosed). Cardiac cath 08/06/23 with moderate non-obstructive CAD and normal right and left heart pressures. Mean aortic valve gradient 29 mmHg by cath with AVA 1.1 cm2. He reported progressive  DOE.    The patient was evaluated by the multidisciplinary valve team and felt to have severe, symptomatic aortic stenosis and to be a suitable candidate for TAVR, which was set up for 10/12/23.   Hospital Course     Consultants: none    Severe AS:  -- S/p successful TAVR with a 26 mm Edwards Sapien 3 Ultra Resilia THV via the TF approach on 10/12/23.  -- Post operative echo completed but pending formal read. -- Groin sites are stable.  -- ECG with sinus and no high grade heart block. -- Continue chronic Asprin 81mg  daily and Plavix  75 mg daily as recommended by neurology.  -- Met with cardiac rehab to discuss CRP phase II.  -- Plan for discharge home today with close follow up in the outpatient setting.    Acute neck swelling and pain: -- CT head and neck with acute findings. -- No swelling on my exam today. -- Continue supportive care with Tylenol  and follow up with PCP.   Hypomagnesemia: -- Supplemented.    CAD: -- Cardiac cath 08/06/23 with moderate non-obstructive CAD. -- Continue  aspirin  81mg  daily.  -- Continue pitavistatin 4mg  daily.    DMT2:  -- Treated with SSI while admitted.  -- Resume home meds at discharge. -- Okay to resume Metformin after 48 hours after contrast dye exposure (8/14 PM)    Hx of CVA: -- Continue chronic Asprin 81mg  daily and Plavix  75 mg daily as recommended by neurology.  -- Continue pitavistatin 4mg  daily.    Carotid artery disease: -- S/p R carotid stenting, -- Serial dopplers followed by Dr. Pietro.   Lymphadenopathy: -- Pre TAVR CT showed small hilar and mediastinal lymph nodes likely reactive in nature. These will be better evaluated on dedicated CT of the chest. -- This will be discussed in the outpatient setting. _____________    Outpatient Medications Prior to Visit  Medication Sig Dispense Refill   amLODipine  (NORVASC ) 5 MG tablet Take 1 tablet (5 mg total) by mouth daily. 90 tablet 3   ammonium lactate (LAC-HYDRIN) 12 % lotion APPLY TO THE TO THE AFFECTED AREA DAILY 400 g 0   amoxicillin  (AMOXIL ) 500 MG tablet Take 4 tablets (2,000 mg total) by mouth as directed. 1 hour prior to dental work including cleanings 12 tablet 12   aspirin  EC 81 MG tablet Take 1 tablet (81 mg total) by mouth daily. Swallow whole. 30 tablet 12   Cholecalciferol  1000 units tablet Take 1,000 Units by mouth daily.     citalopram  (CELEXA ) 10 MG tablet TAKE 1 TABLET(10 MG) BY MOUTH DAILY 90 tablet 3   clopidogrel  (PLAVIX ) 75 MG tablet TAKE 1 TABLET(75 MG) BY MOUTH DAILY 90 tablet 3   Cyanocobalamin  (VITAMIN B-12) 1000 MCG SUBL Place 1 tablet (1,000 mcg total) under the tongue daily. 100 tablet 3   ezetimibe  (ZETIA ) 10 MG tablet TAKE 1 TABLET(10 MG) BY MOUTH DAILY 90 tablet 2   glucose blood (ONETOUCH VERIO) test strip check blood sugar In Vitro once a day DX: E11.42 for 90 days     loperamide (IMODIUM) 2 MG capsule Take 2 mg by mouth daily as needed (Diarrhea).     metFORMIN (GLUCOPHAGE-XR) 500 MG 24 hr tablet Take 1,000 mg by mouth daily with  supper.   3   metoprolol  succinate (TOPROL -XL) 25 MG 24 hr tablet TAKE 1 TABLET(25 MG) BY MOUTH DAILY 90 tablet 1   nitroGLYCERIN  (NITROSTAT ) 0.4 MG SL tablet Place 1 tablet (0.4 mg total) under  the tongue every 5 (five) minutes x 3 doses as needed for chest pain. 25 tablet 12   tamsulosin  (FLOMAX ) 0.4 MG CAPS capsule TAKE 1 CAPSULE(0.4 MG) BY MOUTH AT BEDTIME 90 capsule 3   tolterodine  (DETROL ) 2 MG tablet Take 1 tablet (2 mg total) by mouth 2 (two) times daily. 60 tablet 5   ZYPITAMAG  4 MG TABS TAKE 1 TABLET BY MOUTH EVERY DAY 60 tablet 5   acyclovir  (ZOVIRAX ) 200 MG capsule TAKE 1 CAPSULE BY MOUTH TWICE DAILY 60 capsule 2   pantoprazole  (PROTONIX ) 40 MG tablet TAKE 1 TABLET BY MOUTH EVERY DAY 90 tablet 3   predniSONE  (DELTASONE ) 5 MG tablet Take 1 tablet (5 mg total) by mouth daily with breakfast. (Patient not taking: Reported on 10/20/2023) 15 tablet 1   No facility-administered medications prior to visit.    ROS: Review of Systems  Objective:  Ht 5' 10.5 (1.791 m)   BMI 26.28 kg/m   BP Readings from Last 3 Encounters:  10/18/23 126/72  10/14/23 (!) 140/70  10/13/23 132/63    Wt Readings from Last 3 Encounters:  10/18/23 185 lb 12.8 oz (84.3 kg)  10/13/23 186 lb 11.7 oz (84.7 kg)  10/01/23 191 lb 3.2 oz (86.7 kg)    Physical Exam  Lab Results  Component Value Date   WBC 7.9 10/13/2023   HGB 11.1 (L) 10/13/2023   HCT 33.0 (L) 10/13/2023   PLT 158 10/13/2023   GLUCOSE 197 (H) 10/13/2023   CHOL 121 08/31/2022   TRIG 120 08/31/2022   HDL 38 (L) 08/31/2022   LDLDIRECT 51.0 07/24/2016   LDLCALC 59 08/31/2022   ALT 15 10/08/2023   AST 16 10/08/2023   NA 137 10/13/2023   K 3.7 10/13/2023   CL 105 10/13/2023   CREATININE 1.17 10/13/2023   BUN 18 10/13/2023   CO2 20 (L) 10/13/2023   TSH 1.550 04/21/2022   PSA 1.75 07/24/2016   INR 1.0 10/08/2023   HGBA1C 7.7 (H) 04/08/2023    ECHOCARDIOGRAM COMPLETE Result Date: 10/14/2023    ECHOCARDIOGRAM REPORT   Patient  Name:   Andrew CARRERAS Sr. Date of Exam: 10/13/2023 Medical Rec #:  [id number]           Height:       70.5 in Accession #:    [id number]          Weight:       186.7 lb Date of Birth:  [birth year]           BSA:          2.038 m Patient Age:    83 years            BP:           125/51 mmHg Patient Gender: M                   HR:           68 bpm. Exam Location:  Inpatient Procedure: 2D Echo, Cardiac Doppler and Color Doppler (Both Spectral and Color            Flow Doppler were utilized during procedure). Indications:    S/P TVR  History:        Patient has prior history of Echocardiogram examinations, most                 recent 10/12/2023. Risk Factors:Diabetes, Dyslipidemia and Sleep  Apnea.  Sonographer:    Therisa Crouch Referring Phys: [id number] KATHRYN R THOMPSON IMPRESSIONS  1. Left ventricular ejection fraction, by estimation, is 65 to 70%. The left ventricle has normal function. The left ventricle has no regional wall motion abnormalities. There is moderate concentric left ventricular hypertrophy. Left ventricular diastolic parameters are consistent with Grade I diastolic dysfunction (impaired relaxation).  2. Right ventricular systolic function is normal. The right ventricular size is normal.  3. Left atrial size was mild to moderately dilated.  4. The mitral valve is abnormal. No evidence of mitral valve regurgitation. No evidence of mitral stenosis. Moderate mitral annular calcification.  5. The aortic valve has been repaired/replaced. Aortic valve regurgitation is not visualized. No aortic stenosis is present. Echo findings are consistent with normal structure and function of the aortic valve prosthesis. Aortic valve mean gradient measures 8.0 mmHg. Aortic valve Vmax measures 1.97 m/s.  6. The inferior vena cava is normal in size with greater than 50% respiratory variability, suggesting right atrial pressure of 3 mmHg. FINDINGS  Left Ventricle: Left ventricular ejection fraction, by  estimation, is 65 to 70%. The left ventricle has normal function. The left ventricle has no regional wall motion abnormalities. The left ventricular internal cavity size was normal in size. There is  moderate concentric left ventricular hypertrophy. Left ventricular diastolic parameters are consistent with Grade I diastolic dysfunction (impaired relaxation). Right Ventricle: The right ventricular size is normal. No increase in right ventricular wall thickness. Right ventricular systolic function is normal. Left Atrium: Left atrial size was mild to moderately dilated. Right Atrium: Right atrial size was normal in size. Pericardium: There is no evidence of pericardial effusion. Mitral Valve: The mitral valve is abnormal. Moderate mitral annular calcification. No evidence of mitral valve regurgitation. No evidence of mitral valve stenosis. MV peak gradient, 9.1 mmHg. The mean mitral valve gradient is 3.0 mmHg. Tricuspid Valve: The tricuspid valve is normal in structure. Tricuspid valve regurgitation is trivial. No evidence of tricuspid stenosis. Aortic Valve: The aortic valve has been repaired/replaced. Aortic valve regurgitation is not visualized. No aortic stenosis is present. Aortic valve mean gradient measures 8.0 mmHg. Aortic valve peak gradient measures 15.5 mmHg. There is a 26 mm Sapien prosthetic, stented (TAVR) valve present in the aortic position. Echo findings are consistent with normal structure and function of the aortic valve prosthesis. Pulmonic Valve: The pulmonic valve was normal in structure. Pulmonic valve regurgitation is trivial. No evidence of pulmonic stenosis. Aorta: The aortic root is normal in size and structure. Venous: The inferior vena cava is normal in size with greater than 50% respiratory variability, suggesting right atrial pressure of 3 mmHg. IAS/Shunts: No atrial level shunt detected by color flow Doppler.  LEFT VENTRICLE PLAX 2D LVIDd:         4.20 cm   Diastology LVIDs:         2.30  cm   LV e' medial:    4.13 cm/s LV PW:         1.50 cm   LV E/e' medial:  16.0 LV IVS:        1.70 cm   LV e' lateral:   8.38 cm/s LVOT diam:     2.00 cm   LV E/e' lateral: 7.9 LVOT Area:     3.14 cm  RIGHT VENTRICLE             IVC RV S prime:     19.90 cm/s  IVC diam: 1.40 cm TAPSE (M-mode): 2.0 cm  LEFT ATRIUM             Index LA diam:        3.80 cm 1.86 cm/m LA Vol (A2C):   52.2 ml 25.61 ml/m LA Vol (A4C):   63.9 ml 31.35 ml/m LA Biplane Vol: 59.0 ml 28.95 ml/m  AORTIC VALVE AV Vmax:      197.00 cm/s AV Vmean:     129.000 cm/s AV VTI:       0.382 m AV Peak Grad: 15.5 mmHg AV Mean Grad: 8.0 mmHg  AORTA Ao Asc diam: 3.40 cm MITRAL VALVE MV Area (PHT): 2.61 cm     SHUNTS MV Peak grad:  9.1 mmHg     Systemic Diam: 2.00 cm MV Mean grad:  3.0 mmHg MV Vmax:       1.51 m/s MV Vmean:      84.2 cm/s MV Decel Time: 291 msec MV E velocity: 66.00 cm/s MV A velocity: 124.00 cm/s MV E/A ratio:  0.53 Toribio Fuel MD Electronically signed by Toribio Fuel MD Signature Date/Time: 10/14/2023/6:23:07 AM    Final    CT SOFT TISSUE NECK WO CONTRAST Result Date: 10/13/2023 CLINICAL DATA:  Initial evaluation for acute severe left neck pain with mild swelling. EXAM: CT NECK WITHOUT CONTRAST TECHNIQUE: Multidetector CT imaging of the neck was performed following the standard protocol without intravenous contrast. RADIATION DOSE REDUCTION: This exam was performed according to the departmental dose-optimization program which includes automated exposure control, adjustment of the mA and/or kV according to patient size and/or use of iterative reconstruction technique. COMPARISON:  None Available. FINDINGS: Pharynx and larynx: Oral cavity within normal limits. Oropharynx and nasopharynx within normal limits. No retropharyngeal collection or swelling. Negative epiglottis. Hypopharynx and supraglottic larynx within normal limits. Negative glottis. Subglottic airway clear. Salivary glands: Salivary glands including the parotid  and submandibular glands are within normal limits. Thyroid : Normal. Lymph nodes: No enlarged or pathologic lymph nodes within the neck. Vascular: Atheromatous disease present about the carotid bifurcations bilaterally. Vascular stent traverses the right carotid bulb. Evaluation of vascular structures otherwise limited given lack of IV contrast. Limited intracranial: Unremarkable. Visualized orbits: Unremarkable. Mastoids and visualized paranasal sinuses: Visualized paranasal sinuses are clear. Visualized mastoids and middle ear cavities are well pneumatized and free of fluid. Skeleton: No worrisome osseous lesions. Mild cervical spondylosis for age at C5-6 and C6-7. Upper chest: Visualized lung apices are clear. No other acute finding. Other: None. IMPRESSION: 1. Negative CT of the neck. No acute inflammatory changes or other abnormality identified. 2. Atheromatous disease about the carotid bifurcations bilaterally. Vascular stent traverses the right carotid bulb. Electronically Signed   By: Morene Hoard M.D.   On: 10/13/2023 03:13   CT HEAD WO CONTRAST ( ) Result Date: 10/13/2023 CLINICAL DATA:  Initial evaluation for acute severe left neck pain. EXAM: CT HEAD WITHOUT CONTRAST TECHNIQUE: Contiguous axial images were obtained from the base of the skull through the vertex without intravenous contrast. RADIATION DOSE REDUCTION: This exam was performed according to the departmental dose-optimization program which includes automated exposure control, adjustment of the mA and/or kV according to patient size and/or use of iterative reconstruction technique. COMPARISON:  Prior study from 10/22/2021 FINDINGS: Brain: Mild age-related cerebral atrophy with chronic small vessel ischemic disease. No acute intracranial hemorrhage. No acute large vessel territory infarct. No mass lesion, midline shift or mass effect. No hydrocephalus or extra-axial fluid collection. Vascular: No abnormal hyperdense vessel. Scattered  vascular calcifications noted within the carotid siphons. Skull: Scalp soft tissues  within normal limits.  Calvarium intact. Sinuses/Orbits: Globes and orbital soft tissues within normal limits. Paranasal sinuses are largely clear. No significant mastoid effusion. Other: None. IMPRESSION: 1. No acute intracranial abnormality. 2. Mild age-related cerebral atrophy with chronic small vessel ischemic disease. Electronically Signed   By: Morene Hoard M.D.   On: 10/13/2023 03:09   ECHOCARDIOGRAM LIMITED Result Date: 10/12/2023    ECHOCARDIOGRAM LIMITED REPORT   Patient Name:   Andrew RIPPLE Sr. Date of Exam: 10/12/2023 Medical Rec #:  [id number]           Height:       70.5 in Accession #:    [id number]          Weight:       190.0 lb Date of Birth:  23-Mar-1940           BSA:          2.053 m Patient Age:    83 years            BP:           158/67 mmHg Patient Gender: M                   HR:           61 bpm. Exam Location:  Inpatient Procedure: Limited Echo, Color Doppler and Cardiac Doppler (Both Spectral and            Color Flow Doppler were utilized during procedure). Indications:     Aortic Stenosis i35.0  History:         Patient has prior history of Echocardiogram examinations, most                  recent 07/23/2023. Risk Factors:Hypertension, Diabetes,                  Dyslipidemia and Sleep Apnea.                  Aortic Valve: 26 mm Edwards Sapien prosthetic, stented (TAVR)                  valve is present in the aortic position. Procedure Date:                  10/12/23.  Sonographer:     Damien Senior RDCS Referring Phys:  [phone] MICHAEL COOPER Diagnosing Phys: Darryle Decent MD IMPRESSIONS  1. Echo guided TAVR. No regurgitation or paravalvular leak. The aortic valve has been repaired/replaced. There is a 26 mm Edwards Sapien prosthetic (TAVR) valve present in the aortic position. Procedure Date: 10/12/23. Aortic valve area, by VTI measures 2.23 cm. Aortic valve mean gradient measures 4.7 mmHg. Aortic  valve Vmax measures 1.40 m/s.  2. Left ventricular ejection fraction, by estimation, is 65 to 70%. The left ventricle has normal function.  3. Right ventricular systolic function is normal. The right ventricular size is normal.  4. Left atrial size was mildly dilated.  5. The mitral valve is degenerative. Trivial mitral valve regurgitation. No evidence of mitral stenosis. Moderate mitral annular calcification. FINDINGS  Left Ventricle: Left ventricular ejection fraction, by estimation, is 65 to 70%. The left ventricle has normal function. Right Ventricle: The right ventricular size is normal. Right ventricular systolic function is normal. Left Atrium: Left atrial size was mildly dilated. Right Atrium: Right atrial size was normal in size. Pericardium: There is no evidence of pericardial effusion. Mitral Valve: The mitral valve is degenerative in  appearance. Moderate mitral annular calcification. Trivial mitral valve regurgitation. No evidence of mitral valve stenosis. Tricuspid Valve: The tricuspid valve is grossly normal. Tricuspid valve regurgitation is trivial. Aortic Valve: Echo guided TAVR. No regurgitation or paravalvular leak. The aortic valve has been repaired/replaced. Aortic valve mean gradient measures 4.7 mmHg. Aortic valve peak gradient measures 7.8 mmHg. Aortic valve area, by VTI measures 2.23 cm. There is a 26 mm Edwards Sapien prosthetic, stented (TAVR) valve present in the aortic position. Procedure Date: 10/12/23. Pulmonic Valve: The pulmonic valve was grossly normal. Pulmonic valve regurgitation is mild. No evidence of pulmonic stenosis. Additional Comments: Spectral Doppler performed. Color Doppler performed.  LEFT VENTRICLE PLAX 2D LVOT diam:     2.00 cm LV SV:         73 LV SV Index:   35 LVOT Area:     3.14 cm  AORTIC VALVE AV Area (Vmax):    2.00 cm AV Area (Vmean):   1.09 cm AV Area (VTI):     2.23 cm AV Vmax:           139.88 cm/s AV Vmean:          200.604 cm/s AV VTI:            0.327  m AV Peak Grad:      7.8 mmHg AV Mean Grad:      4.7 mmHg LVOT Vmax:         88.90 cm/s LVOT Vmean:        69.600 cm/s LVOT VTI:          0.232 m LVOT/AV VTI ratio: 0.71  SHUNTS Systemic VTI:  0.23 m Systemic Diam: 2.00 cm Darryle Decent MD Electronically signed by Darryle Decent MD Signature Date/Time: 10/12/2023/11:31:34 AM    Final    Structural Heart Procedure Result Date: 10/12/2023 See surgical note for result.   Assessment & Plan:   Problem List Items Addressed This Visit     B12 deficiency   On B12      CRI (chronic renal insufficiency), stage 3 (moderate) (HCC)   Hydrate well Off HCTZ      DM II (diabetes mellitus, type II), controlled (HCC)   On Metformin Check A1c      Hilar adenopathy   08/2023 TAVR CT w/Prominent mediastinal and hilar lymph nodes. Small hiatal hernia. Thyroid  gland, trachea, and esophagus demonstrate no significant findings. Repeat CT in 6 mo      Polyneuropathy due to type 2 diabetes mellitus (HCC)   Monitor A1c      S/P TAVR (transcatheter aortic valve replacement) - Primary   Doing well DOE is much better         Meds ordered this encounter  Medications   pantoprazole  (PROTONIX ) 40 MG tablet    Sig: TAKE 1 TABLET BY MOUTH EVERY DAY    Dispense:  90 tablet    Refill:  3   acyclovir  (ZOVIRAX ) 200 MG capsule    Sig: Take 1 capsule (200 mg total) by mouth 2 (two) times daily.    Dispense:  180 capsule    Refill:  3      Follow-up: Return in about 3 months (around 01/20/2024) for a follow-up visit.  Marolyn Noel, MD

## 2023-10-25 ENCOUNTER — Telehealth: Payer: Self-pay | Admitting: Physician Assistant

## 2023-10-25 NOTE — Telephone Encounter (Signed)
  [facility]   MULTIDISCIPLINARY HEART VALVE TEAM   Pt's been having left shoulder pain since last Thursday that is so bad he had to take wife's narcotic pain medication. Pain is worse with arm straight down. Wife says he is not a complainer and so she thinks it is significant. It happens everyday since last Thursday. This is the worst pain he has had since his neck pain in the hospital that had an unremarkable work up.   I am not sure what is causing this, but she said this has only occurred since his TAVR. I am not sure what would be causing this. I don't think that is sounds cardiac. BP/HR readings have been in good range. I told him if he is having severe ongoing pain he will have to go to the ER for evaluation.   Right now he is doing fine with no issues. If he continues to have mild pain I have asked him to follow up with PCP  Lamarr Hummer PA-C  [facility]

## 2023-10-27 ENCOUNTER — Telehealth: Payer: Self-pay

## 2023-10-27 NOTE — Telephone Encounter (Signed)
 The Andrew Malone's wife, [name], called into the office with concerns about the Andrew Malone's shoulder discomfort this morning.  She states the Andrew Malone is complaining of 7/10 shoulder pain but is starting to have improvement in pain.  [name] states that his BP was elevated this morning 174/86 and he has taken his medications.  She is concerned because his BP is higher than his baseline and he continues to have off and on shoulder pain which started last Thursday and has been severe at times.  The Andrew Malone spoke with Izetta Hummer PA-C on Monday and was advised to proceed to ER if he had further symptoms.  The Andrew Malone's wife is concerned that this is cardiac in nature and I advised that the patient proceed to the ER for further evaluation as he is currently having symptoms. Andrew Malone and wife agree with plan at this time.    FYI In reviewing the Andrew Malone's 7/15 pre TAVR CTA scans the radiologist did comment on musculoskeletal findings.  Musculoskeletal: No acute osseous findings. Disc degenerative disease and bridging osteophytosis throughout the thoracic and upper lumbar spine, in keeping with DISH.

## 2023-10-28 ENCOUNTER — Telehealth: Payer: Self-pay

## 2023-10-28 NOTE — Telephone Encounter (Signed)
 Copied from CRM [id number]. Topic: Clinical - Medical Advice >> Oct 28, 2023  8:28 AM Laymon HERO wrote: Reason for CRM: Patient wife [name] asking for Hadassah to call her when can

## 2023-10-28 NOTE — Telephone Encounter (Signed)
 Pts wife has asked if Caridac enzyme blood test be ordered at the [facility] location due to being advised by pts cardiologist to have cardiac enzymes checked due to pain in shoulder going into back and neck.

## 2023-10-30 ENCOUNTER — Emergency Department (HOSPITAL_COMMUNITY)

## 2023-10-30 ENCOUNTER — Other Ambulatory Visit: Payer: Self-pay

## 2023-10-30 ENCOUNTER — Emergency Department (HOSPITAL_COMMUNITY)
Admission: EM | Admit: 2023-10-30 | Discharge: 2023-10-30 | Disposition: A | Discharge status: Home / Self Care | Attending: Emergency Medicine | Admitting: Emergency Medicine

## 2023-10-30 ENCOUNTER — Encounter (HOSPITAL_COMMUNITY): Payer: Self-pay | Admitting: Emergency Medicine

## 2023-10-30 DIAGNOSIS — M25512 Pain in left shoulder: Secondary | ICD-10-CM | POA: Insufficient documentation

## 2023-10-30 DIAGNOSIS — R079 Chest pain, unspecified: Secondary | ICD-10-CM | POA: Diagnosis not present

## 2023-10-30 DIAGNOSIS — Z7984 Long term (current) use of oral hypoglycemic drugs: Secondary | ICD-10-CM | POA: Diagnosis not present

## 2023-10-30 DIAGNOSIS — I10 Essential (primary) hypertension: Secondary | ICD-10-CM | POA: Diagnosis not present

## 2023-10-30 DIAGNOSIS — Z48812 Encounter for surgical aftercare following surgery on the circulatory system: Secondary | ICD-10-CM | POA: Diagnosis not present

## 2023-10-30 DIAGNOSIS — Z7902 Long term (current) use of antithrombotics/antiplatelets: Secondary | ICD-10-CM | POA: Insufficient documentation

## 2023-10-30 DIAGNOSIS — Z79899 Other long term (current) drug therapy: Secondary | ICD-10-CM | POA: Insufficient documentation

## 2023-10-30 DIAGNOSIS — Z7982 Long term (current) use of aspirin: Secondary | ICD-10-CM | POA: Diagnosis not present

## 2023-10-30 DIAGNOSIS — E119 Type 2 diabetes mellitus without complications: Secondary | ICD-10-CM | POA: Diagnosis not present

## 2023-10-30 DIAGNOSIS — M19012 Primary osteoarthritis, left shoulder: Secondary | ICD-10-CM | POA: Diagnosis not present

## 2023-10-30 LAB — CBC WITH DIFFERENTIAL/PLATELET
Abs Immature Granulocytes: 0.01 K/uL (ref 0.00–0.07)
Basophils Absolute: 0 K/uL (ref 0.0–0.1)
Basophils Relative: 1 %
Eosinophils Absolute: 0.2 K/uL (ref 0.0–0.5)
Eosinophils Relative: 5 %
HCT: 35 % — ABNORMAL LOW (ref 39.0–52.0)
Hemoglobin: 11.6 g/dL — ABNORMAL LOW (ref 13.0–17.0)
Immature Granulocytes: 0 %
Lymphocytes Relative: 25 %
Lymphs Abs: 1.3 K/uL (ref 0.7–4.0)
MCH: 30.1 pg (ref 26.0–34.0)
MCHC: 33.1 g/dL (ref 30.0–36.0)
MCV: 90.9 fL (ref 80.0–100.0)
Monocytes Absolute: 0.6 K/uL (ref 0.1–1.0)
Monocytes Relative: 11 %
Neutro Abs: 3 K/uL (ref 1.7–7.7)
Neutrophils Relative %: 58 %
Platelets: 160 K/uL (ref 150–400)
RBC: 3.85 MIL/uL — ABNORMAL LOW (ref 4.22–5.81)
RDW: 12.6 % (ref 11.5–15.5)
WBC: 5.1 K/uL (ref 4.0–10.5)
nRBC: 0 % (ref 0.0–0.2)

## 2023-10-30 LAB — COMPREHENSIVE METABOLIC PANEL WITH GFR
ALT: 17 U/L (ref 0–44)
AST: 17 U/L (ref 15–41)
Albumin: 3.9 g/dL (ref 3.5–5.0)
Alkaline Phosphatase: 57 U/L (ref 38–126)
Anion gap: 11 (ref 5–15)
BUN: 22 mg/dL (ref 8–23)
CO2: 27 mmol/L (ref 22–32)
Calcium: 9.9 mg/dL (ref 8.9–10.3)
Chloride: 102 mmol/L (ref 98–111)
Creatinine, Ser: 1.23 mg/dL (ref 0.61–1.24)
GFR, Estimated: 59 mL/min — ABNORMAL LOW (ref >60)
Glucose, Bld: 145 mg/dL — ABNORMAL HIGH (ref 70–99)
Potassium: 4.4 mmol/L (ref 3.5–5.1)
Sodium: 140 mmol/L (ref 135–145)
Total Bilirubin: 0.8 mg/dL (ref 0.0–1.2)
Total Protein: 6.8 g/dL (ref 6.5–8.1)

## 2023-10-30 LAB — LIPASE, BLOOD: Lipase: 38 U/L (ref 11–51)

## 2023-10-30 LAB — MAGNESIUM: Magnesium: 1.6 mg/dL — ABNORMAL LOW (ref 1.7–2.4)

## 2023-10-30 LAB — TROPONIN I (HIGH SENSITIVITY): Troponin I (High Sensitivity): 7 ng/L (ref <18)

## 2023-10-30 LAB — CBG MONITORING, ED: Glucose-Capillary: 138 mg/dL — ABNORMAL HIGH (ref 70–99)

## 2023-10-30 MED ORDER — OXYCODONE-ACETAMINOPHEN 5-325 MG PO TABS: 1.0000 | ORAL_TABLET | Freq: Three times a day (TID) | ORAL | 0 refills | Status: AC | PRN

## 2023-10-30 MED ORDER — OXYCODONE-ACETAMINOPHEN 5-325 MG PO TABS
1.0000 | ORAL_TABLET | Freq: Once | ORAL | Status: AC
  Administered 2023-10-30: 1 via ORAL
  Filled 2023-10-30: qty 1

## 2023-10-30 MED ORDER — MAGNESIUM SULFATE 2 GM/50ML IV SOLN
2.0000 g | Freq: Once | INTRAVENOUS | Status: AC
  Administered 2023-10-30: 2 g via INTRAVENOUS
  Filled 2023-10-30: qty 50

## 2023-10-30 NOTE — ED Notes (Signed)
 ED Provider at bedside.

## 2023-10-30 NOTE — Discharge Instructions (Addendum)
 A prescription for narcotic pain medication was sent to your pharmacy.  Take this only as needed.  Your symptoms may be due to rotator cuff injury or a pinched nerve in your neck.  Both of these should get better with time.  Wear sling for comfort.  Sling should be removed several times a day to practice gentle range of motion exercises.  A list of these exercises is attached.  If you have persistent pain and/or lack of function of your left shoulder, call the telephone number below to follow-up with an orthopedic doctor.  Return to the emergency department for any new or worsening symptoms of concern.

## 2023-10-30 NOTE — ED Provider Notes (Signed)
 [place] EMERGENCY DEPARTMENT AT [facility] Provider Note   CSN: [id number] Arrival date & time: 10/30/23  9246     Patient presents with: Shoulder Pain   Andrew Malone. is a 83 y.o. male.    Shoulder Pain Patient presents for left shoulder pain.  Medical history includes anxiety, depression, HTN, HLD, CVD, GERD, OSA, DM, anemia, BPH, aortic stenosis s/p TAVR.  He is 2-1/2 weeks postop from TAVR surgery.  He had some neck swelling following his surgery which was evaluated by CT scan.  This has resolved.  He has also had left shoulder pain since his procedure which has not gotten any better.  He feels like it is worsening.  Pain is present throughout the day.  Severity waxes and wanes.  Currently it is 8/10 in severity.  He denies any other areas of discomfort or any other associated symptoms.     Prior to Admission medications   Medication Sig Start Date End Date Taking? Authorizing Provider  oxyCODONE -acetaminophen  (PERCOCET/ROXICET) 5-325 MG tablet Take 1 tablet by mouth every 8 (eight) hours as needed for up to 5 days for severe pain (pain score 7-10). 10/30/23 11/04/23 Yes Melvenia Motto, MD  acyclovir  (ZOVIRAX ) 200 MG capsule Take 1 capsule (200 mg total) by mouth 2 (two) times daily. 10/20/23   Plotnikov, Karlynn GAILS, MD  amLODipine  (NORVASC ) 5 MG tablet Take 1 tablet (5 mg total) by mouth daily. 10/18/23 01/16/24  Sebastian Lamarr SAUNDERS, PA-C  ammonium lactate (LAC-HYDRIN) 12 % lotion APPLY TO THE TO THE AFFECTED AREA DAILY 09/29/21   Plotnikov, Aleksei V, MD  amoxicillin  (AMOXIL ) 500 MG tablet Take 4 tablets (2,000 mg total) by mouth as directed. 1 hour prior to dental work including cleanings 10/18/23   Sebastian Lamarr SAUNDERS, PA-C  aspirin  EC 81 MG tablet Take 1 tablet (81 mg total) by mouth daily. Swallow whole. 09/23/21   Johnson, Kathleen R, PA-C  Cholecalciferol  1000 units tablet Take 1,000 Units by mouth daily.    [provider]  citalopram  (CELEXA ) 10 MG tablet  TAKE 1 TABLET(10 MG) BY MOUTH DAILY 05/01/23   Plotnikov, Aleksei V, MD  clopidogrel  (PLAVIX ) 75 MG tablet TAKE 1 TABLET(75 MG) BY MOUTH DAILY 12/09/22   Plotnikov, Aleksei V, MD  Cyanocobalamin  (VITAMIN B-12) 1000 MCG SUBL Place 1 tablet (1,000 mcg total) under the tongue daily. 07/17/13   Plotnikov, Karlynn GAILS, MD  ezetimibe  (ZETIA ) 10 MG tablet TAKE 1 TABLET(10 MG) BY MOUTH DAILY 04/20/23   Pietro Redell RAMAN, MD  glucose blood (ONETOUCH VERIO) test strip check blood sugar In Vitro once a day DX: E11.42 for 90 days 08/06/20   [provider]  loperamide (IMODIUM) 2 MG capsule Take 2 mg by mouth daily as needed (Diarrhea).    [provider]  metFORMIN (GLUCOPHAGE-XR) 500 MG 24 hr tablet Take 1,000 mg by mouth daily with supper.  04/01/15   [provider]  metoprolol  succinate (TOPROL -XL) 25 MG 24 hr tablet TAKE 1 TABLET(25 MG) BY MOUTH DAILY 05/27/23   Pietro Redell RAMAN, MD  nitroGLYCERIN  (NITROSTAT ) 0.4 MG SL tablet Place 1 tablet (0.4 mg total) under the tongue every 5 (five) minutes x 3 doses as needed for chest pain. 09/22/21   Vicci Rollo SAUNDERS, PA-C  pantoprazole  (PROTONIX ) 40 MG tablet TAKE 1 TABLET BY MOUTH EVERY DAY 10/20/23   Plotnikov, Aleksei V, MD  predniSONE  (DELTASONE ) 5 MG tablet Take 1 tablet (5 mg total) by mouth daily with breakfast. Patient not  taking: Reported on 10/20/2023 07/08/23   Plotnikov, Karlynn GAILS, MD  tamsulosin  (FLOMAX ) 0.4 MG CAPS capsule TAKE 1 CAPSULE(0.4 MG) BY MOUTH AT BEDTIME 08/10/23   Matilda Senior, MD  tolterodine  (DETROL ) 2 MG tablet Take 1 tablet (2 mg total) by mouth 2 (two) times daily. 01/06/23   Plotnikov, Karlynn GAILS, MD  ZYPITAMAG  4 MG TABS TAKE 1 TABLET BY MOUTH EVERY DAY 12/29/22   Plotnikov, Aleksei V, MD    Allergies: Empagliflozin , Lipitor [atorvastatin ], Lisinopril , Spiriva  respimat [tiotropium bromide monohydrate ], and Trulicity [dulaglutide]    Review of Systems  Musculoskeletal:  Positive for arthralgias.  All other  systems reviewed and are negative.   Updated Vital Signs BP (!) 161/74   Pulse 64   Temp 97.7 F (36.5 C) (Oral)   Resp 18   Ht 5' 10.5 (1.791 m)   Wt 83.9 kg   SpO2 94%   BMI 26.17 kg/m   Physical Exam Vitals and nursing note reviewed.  Constitutional:      General: He is not in acute distress.    Appearance: Normal appearance. He is well-developed. He is not ill-appearing, toxic-appearing or diaphoretic.  HENT:     Head: Normocephalic and atraumatic.     Right Ear: External ear normal.     Left Ear: External ear normal.     Nose: Nose normal.     Mouth/Throat:     Mouth: Mucous membranes are moist.  Eyes:     Extraocular Movements: Extraocular movements intact.     Conjunctiva/sclera: Conjunctivae normal.  Cardiovascular:     Rate and Rhythm: Normal rate and regular rhythm.  Pulmonary:     Effort: Pulmonary effort is normal. No respiratory distress.  Abdominal:     General: There is no distension.     Palpations: Abdomen is soft. There is no mass.     Tenderness: There is no abdominal tenderness.  Musculoskeletal:        General: No swelling or deformity.     Cervical back: Normal range of motion and neck supple.  Skin:    General: Skin is warm and dry.     Coloration: Skin is not jaundiced or pale.  Neurological:     General: No focal deficit present.     Mental Status: He is alert and oriented to person, place, and time.  Psychiatric:        Mood and Affect: Mood normal.        Behavior: Behavior normal.     (all labs ordered are listed, but only abnormal results are displayed) Labs Reviewed  MAGNESIUM  - Abnormal; Notable for the following components:      Result Value   Magnesium  1.6 (*)    All other components within normal limits  COMPREHENSIVE METABOLIC PANEL WITH GFR - Abnormal; Notable for the following components:   Glucose, Bld 145 (*)    GFR, Estimated 59 (*)    All other components within normal limits  CBC WITH DIFFERENTIAL/PLATELET -  Abnormal; Notable for the following components:   RBC 3.85 (*)    Hemoglobin 11.6 (*)    HCT 35.0 (*)    All other components within normal limits  CBG MONITORING, ED - Abnormal; Notable for the following components:   Glucose-Capillary 138 (*)    All other components within normal limits  LIPASE, BLOOD  TROPONIN I (HIGH SENSITIVITY)    EKG: None  Radiology: DG Shoulder Left Result Date: 10/30/2023 EXAM: 1 VIEW XRAY OF THE LEFT SHOULDER 10/30/2023  09:13:38 AM COMPARISON: None available. CLINICAL HISTORY: Patient arrives ambulatory with son by POV c/o left shoulder pain since having TAVR surgery earlier this month. Patient states he had it evaluated after surgery with CT scan but pain has continued. Denies any chest pain or shortness of breath. Denies any weakness or sensory changes. Patient hard of hearing. FINDINGS: BONES AND JOINTS: Glenohumeral joint is normally aligned. No acute fracture or dislocation. Mild glenohumeral osteoarthritis. Mild osteoarthritis of the acromioclavicular joint. SOFT TISSUES: No abnormal calcifications. Visualized lung is unremarkable. IMPRESSION: 1. Mild osteoarthritis of the acromioclavicular joint and glenohumeral joint. Electronically signed by: Waddell Calk MD 10/30/2023 09:35 AM EDT RP Workstation: [id number]   DG Chest Portable 1 View Result Date: 10/30/2023 CLINICAL DATA:  83 year old male status post TAVR. Persistent pain radiating to the left shoulder. EXAM: PORTABLE CHEST 1 VIEW COMPARISON:  Chest radiographs 10/08/2023 and earlier. FINDINGS: Portable AP semi upright view at 0831 hours. Stable lung volumes with eventration of the right hemidiaphragm, normal variant and present on 2004 radiographs. New TAVR. Stable cardiac size and mediastinal contours. Visualized tracheal air column is within normal limits. Allowing for portable technique the lungs are clear. No acute osseous abnormality identified. Negative visible bowel gas. IMPRESSION: No acute  cardiopulmonary abnormality.  Interval TAVR Electronically Signed   By: VEAR Hurst M.D.   On: 10/30/2023 08:39     Procedures   Medications Ordered in the ED  oxyCODONE -acetaminophen  (PERCOCET/ROXICET) 5-325 MG per tablet 1 tablet (1 tablet Oral Given 10/30/23 0836)  magnesium  sulfate IVPB 2 g 50 mL (0 g Intravenous Stopped 10/30/23 1028)                                    Medical Decision Making Amount and/or Complexity of Data Reviewed Labs: ordered. Radiology: ordered.  Risk Prescription drug management.   This patient presents to the ED for concern of left shoulder pain, this involves an extensive number of treatment options, and is a complaint that carries with it a high risk of complications and morbidity.  The differential diagnosis includes ACS, arthritis, occult injury   Co morbidities / Chronic conditions that complicate the patient evaluation  anxiety, depression, HTN, HLD, CVD, GERD, OSA, DM, anemia, BPH, aortic stenosis s/p TAVR   Additional history obtained:  Additional history obtained from EMR External records from outside source obtained and reviewed including patient's family   Lab Tests:  I Ordered, and personally interpreted labs.  The pertinent results include: Normal kidney function, hypomagnesemia with otherwise normal electrolytes, normal hemoglobin, no leukocytosis, normal troponin   Imaging Studies ordered:  I ordered imaging studies including x-ray of chest and left shoulder I independently visualized and interpreted imaging which showed no acute findings.  Arthritis present I agree with the radiologist interpretation   Cardiac Monitoring: / EKG:  The patient was maintained on a cardiac monitor.  I personally viewed and interpreted the cardiac monitored which showed an underlying rhythm of: Sinus rhythm   Problem List / ED Course / Critical interventions / Medication management  Patient presents for worsening shoulder pain since his TAVR  procedure 2-1/2 weeks ago.  Vital signs on arrival are notable for moderate hypertension.  On exam, patient is overall well-appearing.  No swelling or deformity is appreciated to his left shoulder.  Distal pulses and strength are intact.  He is able to range his left shoulder.  He does feel like his pain worsens  with range of motion.  This pain may be secondary to degenerative change or rotator cuff injury.  Given his neck discomfort after surgery, may also be secondary to cervical radiculopathy.  Will obtain workup for concern of referred pain from a cardiac etiology.  Percocet was ordered for analgesia.  Workup was initiated.  Patient's lab work notable for hypomagnesemia.  Replacing magnesium  was ordered.  His troponin was reassuring.  His pain improved with Percocet.  X-ray imaging does show arthritis.  I suspect rotator cuff injury.  Sling was provided.  Patient was advised to wear sling for comfort and do gentle range of motion exercises.  He is stable for discharge. I ordered medication including magnesium  sulfate for hypomagnesemia; Percocet for analgesia Reevaluation of the patient after these medicines showed that the patient improved I have reviewed the patients home medicines and have made adjustments as needed  Social Determinants of Health:  Lives at home with family     Final diagnoses:  Acute pain of left shoulder    ED Discharge Orders          Ordered    oxyCODONE -acetaminophen  (PERCOCET/ROXICET) 5-325 MG tablet  Every 8 hours PRN        10/30/23 1001               Melvenia Motto, MD 10/30/23 1653

## 2023-10-30 NOTE — ED Triage Notes (Signed)
 Patient arrives ambulatory with son by POV c/o left shoulder pain since having TAVR surgery earlier this month. Patient states he had it evaluated after surgery with CT scan but pain has continued. Denies any chest pain or shortness of breath. Denies any weakness or sensory changes. Patient hard of hearing.

## 2023-11-01 NOTE — Telephone Encounter (Signed)
 Signe needs to go to ER if pain is active.  Otherwise, he should contact his cardiologist's office.  Thank you

## 2023-11-02 ENCOUNTER — Encounter (HOSPITAL_COMMUNITY)
Admission: RE | Admit: 2023-11-02 | Discharge: 2023-11-02 | Disposition: A | Discharge status: Home / Self Care | Source: Ambulatory Visit | Attending: Cardiology | Admitting: Cardiology

## 2023-11-02 DIAGNOSIS — Z952 Presence of prosthetic heart valve: Secondary | ICD-10-CM | POA: Insufficient documentation

## 2023-11-02 NOTE — Progress Notes (Signed)
 Completed virtual orientation today.  EP evaluation is scheduled for 11/04/23 at 1330 .  Documentation for diagnosis can be found in [facility] encounter 10/12/23.

## 2023-11-03 ENCOUNTER — Ambulatory Visit: Admitting: Urology

## 2023-11-03 DIAGNOSIS — M75112 Incomplete rotator cuff tear or rupture of left shoulder, not specified as traumatic: Secondary | ICD-10-CM | POA: Diagnosis not present

## 2023-11-04 ENCOUNTER — Encounter (HOSPITAL_COMMUNITY)

## 2023-11-05 NOTE — Telephone Encounter (Signed)
 Patient seen in ED on 08/30

## 2023-11-08 ENCOUNTER — Ambulatory Visit (HOSPITAL_COMMUNITY)

## 2023-11-11 ENCOUNTER — Ambulatory Visit

## 2023-11-11 DIAGNOSIS — Z23 Encounter for immunization: Secondary | ICD-10-CM

## 2023-11-25 ENCOUNTER — Ambulatory Visit
Admission: RE | Admit: 2023-11-25 | Discharge: 2023-11-25 | Disposition: A | Discharge status: Home / Self Care | Source: Ambulatory Visit | Attending: Cardiology | Admitting: Cardiology

## 2023-11-25 ENCOUNTER — Ambulatory Visit (INDEPENDENT_AMBULATORY_CARE_PROVIDER_SITE_OTHER): Admitting: Physician Assistant

## 2023-11-25 ENCOUNTER — Ambulatory Visit: Payer: Self-pay | Admitting: Physician Assistant

## 2023-11-25 VITALS — BP 128/68 | HR 70 | Ht 70.5 in | Wt 184.0 lb

## 2023-11-25 DIAGNOSIS — I639 Cerebral infarction, unspecified: Secondary | ICD-10-CM | POA: Diagnosis not present

## 2023-11-25 DIAGNOSIS — I10 Essential (primary) hypertension: Secondary | ICD-10-CM

## 2023-11-25 DIAGNOSIS — R591 Generalized enlarged lymph nodes: Secondary | ICD-10-CM

## 2023-11-25 DIAGNOSIS — I6350 Cerebral infarction due to unspecified occlusion or stenosis of unspecified cerebral artery: Secondary | ICD-10-CM | POA: Insufficient documentation

## 2023-11-25 DIAGNOSIS — I2510 Atherosclerotic heart disease of native coronary artery without angina pectoris: Secondary | ICD-10-CM

## 2023-11-25 DIAGNOSIS — Z952 Presence of prosthetic heart valve: Secondary | ICD-10-CM | POA: Insufficient documentation

## 2023-11-25 LAB — ECHOCARDIOGRAM COMPLETE
AV Mean grad: 8 mmHg
AV Peak grad: 11.8 mmHg
Ao pk vel: 1.72 m/s
Area-P 1/2: 2.55 cm2
S' Lateral: 2.9 cm

## 2023-11-25 NOTE — Patient Instructions (Signed)
 Medication Instructions:  Your physician recommends that you continue on your current medications as directed. Please refer to the Current Medication list given to you today.  *If you need a refill on your cardiac medications before your next appointment, please call your pharmacy*  Lab Work: None needed If you have labs (blood work) drawn today and your tests are completely normal, you will receive your results only by: MyChart Message (if you have MyChart) OR A paper copy in the mail If you have any lab test that is abnormal or we need to change your treatment, we will call you to review the results.  Testing/Procedures: 10/16/24 Your physician has requested that you have an echocardiogram. Echocardiography is a painless test that uses sound waves to create images of your heart. It provides your doctor with information about the size and shape of your heart and how well your heart's chambers and valves are working. This procedure takes approximately one hour. There are no restrictions for this procedure. Please do NOT wear cologne, perfume, aftershave, or lotions (deodorant is allowed). Please arrive 15 minutes prior to your appointment time.  Please note: We ask at that you not bring children with you during ultrasound (echo/ vascular) testing. Due to room size and safety concerns, children are not allowed in the ultrasound rooms during exams. Our front office staff cannot provide observation of children in our lobby area while testing is being conducted. An adult accompanying a patient to their appointment will only be allowed in the ultrasound room at the discretion of the ultrasound technician under special circumstances. We apologize for any inconvenience.   Follow-Up: At [facility], you and your health needs are our priority.  As part of our continuing mission to provide you with exceptional heart care, our providers are all part of one team.  This team includes your primary  Cardiologist (physician) and Advanced Practice Providers or APPs (Physician Assistants and Nurse Practitioners) who all work together to provide you with the care you need, when you need it.  Your next appointment:   As scheduled on 03/03/2024  Provider:   Redell Shallow, MD    We recommend signing up for the patient portal called MyChart.  Sign up information is provided on this After Visit Summary.  MyChart is used to connect with patients for Virtual Visits (Telemedicine).  Patients are able to view lab/test results, encounter notes, upcoming appointments, etc.  Non-urgent messages can be sent to your provider as well.   To learn more about what you can do with MyChart, go to [email or web address].
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
## 2023-11-29 ENCOUNTER — Other Ambulatory Visit: Payer: Self-pay | Admitting: Internal Medicine

## 2023-11-30 ENCOUNTER — Other Ambulatory Visit: Payer: Self-pay | Admitting: Internal Medicine

## 2023-12-01 ENCOUNTER — Encounter (HOSPITAL_COMMUNITY): Admission: RE | Admit: 2023-12-01 | Source: Ambulatory Visit | Attending: Cardiology | Admitting: Cardiology

## 2023-12-01 DIAGNOSIS — R5383 Other fatigue: Secondary | ICD-10-CM | POA: Diagnosis not present

## 2023-12-01 DIAGNOSIS — E1142 Type 2 diabetes mellitus with diabetic polyneuropathy: Secondary | ICD-10-CM | POA: Diagnosis not present

## 2023-12-01 DIAGNOSIS — Z8639 Personal history of other endocrine, nutritional and metabolic disease: Secondary | ICD-10-CM | POA: Diagnosis not present

## 2023-12-01 DIAGNOSIS — Z7984 Long term (current) use of oral hypoglycemic drugs: Secondary | ICD-10-CM | POA: Diagnosis not present

## 2023-12-01 DIAGNOSIS — N1831 Chronic kidney disease, stage 3a: Secondary | ICD-10-CM | POA: Diagnosis not present

## 2023-12-01 DIAGNOSIS — Z87448 Personal history of other diseases of urinary system: Secondary | ICD-10-CM | POA: Diagnosis not present

## 2023-12-01 DIAGNOSIS — I10 Essential (primary) hypertension: Secondary | ICD-10-CM | POA: Diagnosis not present

## 2023-12-04 ENCOUNTER — Telehealth: Payer: Self-pay | Admitting: Physician Assistant

## 2023-12-04 ENCOUNTER — Other Ambulatory Visit: Payer: Self-pay | Admitting: Physician Assistant

## 2023-12-04 MED ORDER — METOPROLOL SUCCINATE ER 25 MG PO TB24: 25.0000 mg | ORAL_TABLET | Freq: Every day | ORAL | 1 refills | Status: AC

## 2023-12-04 NOTE — Telephone Encounter (Signed)
 Andrew Malone wife called because she was concerned about his medications.  He has been on metoprolol  for a long time.  When she tried to refill this medication, she was told that the med had been closed out.  In reviewing the notes, he was recently seen by Izetta Hummer, PA-C.  He was doing well and although the metoprolol  was not specifically mentioned in her note, she also does not say to stop it.  He has been a little tired in the mornings, but his blood pressure or heart rate have not been low.  Upon reviewing his vital signs, they have been stable and his wife said his heart rate has been in the 60s.  His last dose of the metoprolol  was last night.  I went ahead and renewed the metoprolol  XL 25 mg nightly.  I will route this note to Ms. Hummer in case she does indeed wish to stop the metoprolol .  I advised his wife that she may get a call next week saying to stop the metoprolol  but she is okay going ahead and getting it now and understanding that that may happen.  Shona Shad, PA-C 12/04/2023 5:55 PM

## 2023-12-07 ENCOUNTER — Encounter (HOSPITAL_COMMUNITY)
Admission: RE | Admit: 2023-12-07 | Discharge: 2023-12-07 | Disposition: A | Discharge status: Home / Self Care | Source: Ambulatory Visit | Attending: Cardiology | Admitting: Cardiology

## 2023-12-07 VITALS — Ht 70.0 in | Wt 185.8 lb

## 2023-12-07 DIAGNOSIS — Z952 Presence of prosthetic heart valve: Secondary | ICD-10-CM | POA: Insufficient documentation

## 2023-12-07 NOTE — Progress Notes (Signed)
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   Test and Procedures: - Group verbal and visual presentation and models provide information about basic cardiac anatomy and function. Reviews the testing methods done to diagnose heart disease and the outcomes of the test results. Describes the treatment choices: Medical Management, Angioplasty, or Coronary Bypass Surgery for treating various heart conditions including Myocardial Infarction, Angina, Valve Disease, and Cardiac Arrhythmias. Written material provided at class time.   Medication Safety: - Group verbal and visual instruction to review commonly prescribed medications for heart and lung disease. Reviews the medication, class of the drug, and side effects. Includes the steps to properly store meds and maintain the prescription regimen. Written material provided at class time.   Intimacy: - Group verbal instruction through game format to discuss how heart and lung  disease can affect sexual intimacy. Written material provided at class time.   Know Your Numbers and Heart Failure: - Group verbal and visual instruction to discuss disease risk factors for cardiac and pulmonary disease and treatment options.  Reviews associated critical values for Overweight/Obesity, Hypertension, Cholesterol, and Diabetes.  Discusses basics of heart failure: signs/symptoms and treatments.  Introduces Heart Failure Zone chart for action plan for heart failure. Written material provided at class time.   Infection Prevention: - Provides verbal and written material to individual with discussion of infection control including proper hand washing and proper equipment cleaning during exercise session.   Falls Prevention: - Provides verbal and written material to individual with discussion of falls prevention and safety.   Other: -Provides group and verbal instruction on various topics (see comments)   Knowledge Questionnaire Score:  Knowledge Questionnaire Score - 12/07/23 1323       Knowledge Questionnaire Score   Pre Score 25/26          Core Components/Risk Factors/Patient Goals at Admission:  Personal Goals and Risk Factors at Admission - 11/02/23 1324       Core Components/Risk Factors/Patient Goals on Admission   Diabetes Yes    Intervention Provide education about signs/symptoms and action to take for hypo/hyperglycemia.;Provide education about proper nutrition, including hydration, and aerobic/resistive exercise prescription along with prescribed medications to achieve blood glucose in normal ranges: Fasting glucose 65-99 mg/dL    Expected Outcomes Short Term: Participant verbalizes understanding of the  signs/symptoms and immediate care of hyper/hypoglycemia, proper foot care and importance of medication, aerobic/resistive exercise and nutrition plan for blood glucose control.;Long Term: Attainment of HbA1C < 7%.    Heart Failure Yes    Intervention Provide  a combined exercise and nutrition program that is supplemented with education, support and counseling about heart failure. Directed toward relieving symptoms such as shortness of breath, decreased exercise tolerance, and extremity edema.    Expected Outcomes Improve functional capacity of life;Short term: Attendance in program 2-3 days a week with increased exercise capacity. Reported lower sodium intake. Reported increased fruit and vegetable intake. Reports medication compliance.;Short term: Daily weights obtained and reported for increase. Utilizing diuretic protocols set by physician.;Long term: Adoption of self-care skills and reduction of barriers for early signs and symptoms recognition and intervention leading to self-care maintenance.    Hypertension Yes    Intervention Monitor prescription use compliance.;Provide education on lifestyle modifcations including regular physical activity/exercise, weight management, moderate sodium restriction and increased consumption of fresh fruit, vegetables, and low fat dairy, alcohol moderation, and smoking cessation.    Expected Outcomes Short Term: Continued assessment and intervention until BP is < 140/58mm HG in hypertensive participants. < 130/69mm HG in hypertensive participants with diabetes, heart failure or chronic kidney disease.;Long Term: Maintenance of blood pressure at goal levels.    Lipids Yes    Intervention Provide education and support for participant on nutrition & aerobic/resistive exercise along with prescribed medications to achieve LDL 70mg , HDL >40mg .    Expected Outcomes Long Term: Cholesterol controlled with medications as prescribed, with individualized exercise RX and with personalized nutrition plan. Value goals: LDL < 70mg , HDL > 40 mg.;Short Term: Participant states understanding of desired cholesterol values and is compliant with medications prescribed. Participant is following exercise prescription and nutrition guidelines.           Education:Diabetes - Individual verbal and written instruction to review signs/symptoms of diabetes, desired ranges of glucose level fasting, after meals and with exercise. Acknowledge that pre and post exercise glucose checks will be done for 3 sessions at entry of program.   Core Components/Risk Factors/Patient Goals Review:    Core Components/Risk Factors/Patient Goals at Discharge (Final Review):    ITP Comments:  ITP Comments     Row Name 11/02/23 1314 12/07/23 1331         ITP Comments Completed virtual orientation today.  EP evaluation is scheduled for 11/04/23 at 1330 .  Documentation for diagnosis can be found in [facility] encounter 10/12/23. Patient attend orientation today.  Patient is attending Cardiac Rehabilitation Program.  Documentation for diagnosis can be found in [facility].  Reviewed medical chart, RPE/RPD, gym safety, and program guidelines.  Patient was fitted to equipment they will be using during rehab.  Patient is scheduled to start exercise on 12/14/23.   Initial ITP created and sent for review and signature by Dr. Dorn Ross, Medical Director for Cardiac Rehabilitation Program.         Comments: Initial ITP.

## 2023-12-07 NOTE — Progress Notes (Signed)
 Patient attend orientation today.  Patient is attending Cardiac Rehabilitation Program.  Documentation for diagnosis can be found in [facility].  Reviewed medical chart, RPE/RPD, gym safety, and program guidelines.  Patient was fitted to equipment they will be using during rehab.  Patient is scheduled to start exercise on 12/14/23.   Initial ITP created and sent for review and signature by Dr. Dorn Ross, Medical Director for Cardiac Rehabilitation Program.

## 2023-12-07 NOTE — Patient Instructions (Signed)
 Patient Instructions  Patient Details  Name: Andrew EPP Sr. MRN: [id number] Date of Birth: August 14, 1940 Referring Provider:  Shyrl Linnie KIDD, MD  Below are your personal goals for exercise, nutrition, and risk factors. Our goal is to help you stay on track towards obtaining and maintaining these goals. We will be discussing your progress on these goals with you throughout the program.  Initial Exercise Prescription:  Initial Exercise Prescription - 12/07/23 1300       Date of Initial Exercise RX and Referring Provider   Date 12/07/23    Referring Provider Shyrl Linnie MD   Pietro, Brain MD     Treadmill   MPH 2    Grade 0    Minutes 15    METs 2.53      NuStep   Level 2    SPM 50    Minutes 15    METs 1.9      Prescription Details   Frequency (times per week) 2    Duration Progress to 30 minutes of continuous aerobic without signs/symptoms of physical distress      Intensity   THRR 40-80% of Max Heartrate 99-126    Ratings of Perceived Exertion 11-13    Perceived Dyspnea 0-4      Resistance Training   Training Prescription Yes    Weight 5    Reps 10-15          Exercise Goals: Frequency: Be able to perform aerobic exercise two to three times per week in program working toward 2-5 days per week of home exercise.  Intensity: Work with a perceived exertion of 11 (fairly light) - 15 (hard) while following your exercise prescription.  We will make changes to your prescription with you as you progress through the program.   Duration: Be able to do 30 to 45 minutes of continuous aerobic exercise in addition to a 5 minute warm-up and a 5 minute cool-down routine.   Nutrition Goals: Your personal nutrition goals will be established when you do your nutrition analysis with the dietician.  The following are general nutrition guidelines to follow: Cholesterol < 200mg /day Sodium < 1500mg /day Fiber: Men over 50 yrs - 30 grams per day  Personal Goals:   Personal Goals and Risk Factors at Admission - 11/02/23 1324       Core Components/Risk Factors/Patient Goals on Admission   Diabetes Yes    Intervention Provide education about signs/symptoms and action to take for hypo/hyperglycemia.;Provide education about proper nutrition, including hydration, and aerobic/resistive exercise prescription along with prescribed medications to achieve blood glucose in normal ranges: Fasting glucose 65-99 mg/dL    Expected Outcomes Short Term: Participant verbalizes understanding of the signs/symptoms and immediate care of hyper/hypoglycemia, proper foot care and importance of medication, aerobic/resistive exercise and nutrition plan for blood glucose control.;Long Term: Attainment of HbA1C < 7%.    Heart Failure Yes    Intervention Provide a combined exercise and nutrition program that is supplemented with education, support and counseling about heart failure. Directed toward relieving symptoms such as shortness of breath, decreased exercise tolerance, and extremity edema.    Expected Outcomes Improve functional capacity of life;Short term: Attendance in program 2-3 days a week with increased exercise capacity. Reported lower sodium intake. Reported increased fruit and vegetable intake. Reports medication compliance.;Short term: Daily weights obtained and reported for increase. Utilizing diuretic protocols set by physician.;Long term: Adoption of self-care skills and reduction of barriers for early signs and symptoms recognition and intervention leading to  self-care maintenance.    Hypertension Yes    Intervention Monitor prescription use compliance.;Provide education on lifestyle modifcations including regular physical activity/exercise, weight management, moderate sodium restriction and increased consumption of fresh fruit, vegetables, and low fat dairy, alcohol moderation, and smoking cessation.    Expected Outcomes Short Term: Continued assessment and intervention  until BP is < 140/84mm HG in hypertensive participants. < 130/41mm HG in hypertensive participants with diabetes, heart failure or chronic kidney disease.;Long Term: Maintenance of blood pressure at goal levels.    Lipids Yes    Intervention Provide education and support for participant on nutrition & aerobic/resistive exercise along with prescribed medications to achieve LDL 70mg , HDL >40mg .    Expected Outcomes Long Term: Cholesterol controlled with medications as prescribed, with individualized exercise RX and with personalized nutrition plan. Value goals: LDL < 70mg , HDL > 40 mg.;Short Term: Participant states understanding of desired cholesterol values and is compliant with medications prescribed. Participant is following exercise prescription and nutrition guidelines.          Tobacco Use Initial Evaluation: Social History   Tobacco Use  Smoking Status Never  Smokeless Tobacco Former   Types: Chew   Quit date: 03/03/1975  Tobacco Comments   didn't chew much when I did; just a little bit    Exercise Goals and Review:  Exercise Goals     Row Name 11/02/23 1323 12/07/23 1356           Exercise Goals   Increase Physical Activity Yes Yes      Intervention Provide advice, education, support and counseling about physical activity/exercise needs.;Develop an individualized exercise prescription for aerobic and resistive training based on initial evaluation findings, risk stratification, comorbidities and participant's personal goals. Provide advice, education, support and counseling about physical activity/exercise needs.;Develop an individualized exercise prescription for aerobic and resistive training based on initial evaluation findings, risk stratification, comorbidities and participant's personal goals.      Expected Outcomes Short Term: Attend rehab on a regular basis to increase amount of physical activity.;Long Term: Add in home exercise to make exercise part of routine and to  increase amount of physical activity.;Long Term: Exercising regularly at least 3-5 days a week. Short Term: Attend rehab on a regular basis to increase amount of physical activity.;Long Term: Add in home exercise to make exercise part of routine and to increase amount of physical activity.;Long Term: Exercising regularly at least 3-5 days a week.      Increase Strength and Stamina Yes Yes      Intervention Provide advice, education, support and counseling about physical activity/exercise needs.;Develop an individualized exercise prescription for aerobic and resistive training based on initial evaluation findings, risk stratification, comorbidities and participant's personal goals. Provide advice, education, support and counseling about physical activity/exercise needs.;Develop an individualized exercise prescription for aerobic and resistive training based on initial evaluation findings, risk stratification, comorbidities and participant's personal goals.      Expected Outcomes Short Term: Increase workloads from initial exercise prescription for resistance, speed, and METs.;Short Term: Perform resistance training exercises routinely during rehab and add in resistance training at home;Long Term: Improve cardiorespiratory fitness, muscular endurance and strength as measured by increased METs and functional capacity ( ) Short Term: Increase workloads from initial exercise prescription for resistance, speed, and METs.;Short Term: Perform resistance training exercises routinely during rehab and add in resistance training at home;Long Term: Improve cardiorespiratory fitness, muscular endurance and strength as measured by increased METs and functional capacity ( )      Able  to understand and use rate of perceived exertion (RPE) scale Yes Yes      Intervention Provide education and explanation on how to use RPE scale Provide education and explanation on how to use RPE scale      Expected Outcomes Short Term:  Able to use RPE daily in rehab to express subjective intensity level;Long Term:  Able to use RPE to guide intensity level when exercising independently Short Term: Able to use RPE daily in rehab to express subjective intensity level;Long Term:  Able to use RPE to guide intensity level when exercising independently      Able to understand and use Dyspnea scale -- Yes      Intervention -- Provide education and explanation on how to use Dyspnea scale      Expected Outcomes -- Short Term: Able to use Dyspnea scale daily in rehab to express subjective sense of shortness of breath during exertion;Long Term: Able to use Dyspnea scale to guide intensity level when exercising independently      Knowledge and understanding of Target Heart Rate Range (THRR) Yes Yes      Intervention Provide education and explanation of THRR including how the numbers were predicted and where they are located for reference Provide education and explanation of THRR including how the numbers were predicted and where they are located for reference      Expected Outcomes Short Term: Able to state/look up THRR;Short Term: Able to use daily as guideline for intensity in rehab;Long Term: Able to use THRR to govern intensity when exercising independently Short Term: Able to use daily as guideline for intensity in rehab;Long Term: Able to use THRR to govern intensity when exercising independently;Short Term: Able to state/look up THRR      Able to check pulse independently -- Yes      Intervention -- Review the importance of being able to check your own pulse for safety during independent exercise;Provide education and demonstration on how to check pulse in carotid and radial arteries.      Expected Outcomes -- Short Term: Able to explain why pulse checking is important during independent exercise;Long Term: Able to check pulse independently and accurately      Understanding of Exercise Prescription Yes Yes      Intervention Provide education,  explanation, and written materials on patient's individual exercise prescription Provide education, explanation, and written materials on patient's individual exercise prescription      Expected Outcomes Long Term: Able to explain home exercise prescription to exercise independently;Short Term: Able to explain program exercise prescription Short Term: Able to explain program exercise prescription;Long Term: Able to explain home exercise prescription to exercise independently         Copy of goals given to participant.

## 2023-12-08 ENCOUNTER — Encounter (HOSPITAL_COMMUNITY): Payer: Self-pay | Admitting: *Deleted

## 2023-12-08 DIAGNOSIS — Z952 Presence of prosthetic heart valve: Secondary | ICD-10-CM

## 2023-12-08 NOTE — Progress Notes (Signed)
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 supplemented with education, support and counseling about heart failure. Directed toward relieving symptoms such as shortness of breath, decreased exercise tolerance, and extremity edema.    Expected Outcomes Improve functional capacity of life;Short term: Attendance in program 2-3 days a week with increased exercise capacity. Reported lower sodium intake. Reported increased fruit and vegetable intake. Reports medication compliance.;Short term: Daily weights obtained and reported for increase. Utilizing diuretic protocols set by physician.;Long term: Adoption of self-care skills and reduction of barriers for early signs and symptoms recognition and intervention leading to self-care maintenance.    Hypertension Yes    Intervention Monitor prescription use compliance.;Provide education on lifestyle modifcations including regular physical  activity/exercise, weight management, moderate sodium restriction and increased consumption of fresh fruit, vegetables, and low fat dairy, alcohol moderation, and smoking cessation.    Expected Outcomes Short Term: Continued assessment and intervention until BP is < 140/66mm HG in hypertensive participants. < 130/68mm HG in hypertensive participants with diabetes, heart failure or chronic kidney disease.;Long Term: Maintenance of blood pressure at goal levels.    Lipids Yes    Intervention Provide education and support for participant on nutrition & aerobic/resistive exercise along with prescribed medications to achieve LDL 70mg , HDL >40mg .    Expected Outcomes Long Term: Cholesterol controlled with medications as prescribed, with individualized exercise RX and with personalized nutrition plan. Value goals: LDL < 70mg , HDL > 40 mg.;Short Term: Participant states understanding of desired cholesterol values and is compliant with medications prescribed. Participant is following exercise prescription and nutrition guidelines.          Core Components/Risk Factors/Patient Goals Review:    Core Components/Risk Factors/Patient Goals at Discharge (Final Review):    ITP Comments:  ITP Comments     Row Name 11/02/23 1314 12/07/23 1331 12/08/23 1526       ITP Comments Completed virtual orientation today.  EP evaluation is scheduled for 11/04/23 at 1330 .  Documentation for diagnosis can be found in [facility] encounter 10/12/23. Patient attend orientation today.  Patient is attending Cardiac Rehabilitation Program.  Documentation for diagnosis can be found in [facility].  Reviewed medical chart, RPE/RPD, gym safety, and program guidelines.  Patient was fitted to equipment they will be using during rehab.  Patient is scheduled to start exercise on 12/14/23.   Initial ITP created and sent for review and signature by Dr. Dorn Ross, Medical Director for Cardiac Rehabilitation Program. 30 day review completed. ITP sent  to Dr. Dorn Ross, Medical Director of Cardiac Rehab. Continue with ITP unless changes are made by physician.  Pt has only completed orientation thus far.        Comments: 30 day review

## 2023-12-14 ENCOUNTER — Encounter (HOSPITAL_COMMUNITY)

## 2023-12-15 ENCOUNTER — Ambulatory Visit: Admitting: Urology

## 2023-12-15 DIAGNOSIS — N401 Enlarged prostate with lower urinary tract symptoms: Secondary | ICD-10-CM

## 2023-12-16 ENCOUNTER — Encounter (HOSPITAL_COMMUNITY)

## 2023-12-21 ENCOUNTER — Encounter (HOSPITAL_COMMUNITY)
Admission: RE | Admit: 2023-12-21 | Discharge: 2023-12-21 | Disposition: A | Discharge status: Home / Self Care | Source: Ambulatory Visit | Attending: Cardiology | Admitting: Cardiology

## 2023-12-21 DIAGNOSIS — Z952 Presence of prosthetic heart valve: Secondary | ICD-10-CM

## 2023-12-21 NOTE — Progress Notes (Signed)
 Daily Session Note  Patient Details  Name: Andrew Malone. MRN: [id number] Date of Birth: 23-Mar-1940 Referring Provider:   Flowsheet Row CARDIAC REHAB PHASE II ORIENTATION from 12/07/2023 in [facility] CARDIAC REHABILITATION  Referring Provider Shyrl [place] MD  Bevin Pole MD]    Encounter Date: 12/21/2023  Check In:  Session Check In - 12/21/23 1015       Check-In   Supervising physician immediately available to respond to emergencies See telemetry face sheet for immediately available MD    Location [facility]    Staff Present Powell Benders, BS, Exercise Physiologist;Brittany Jackquline, BSN, RN, WTA-C;Marguetta Windish Zina, RN    Virtual Visit No    Medication changes reported     No    Fall or balance concerns reported    No    Warm-up and Cool-down Performed on first and last piece of equipment    Resistance Training Performed Yes    VAD Patient? No    PAD/SET Patient? No      Pain Assessment   Currently in Pain? No/denies          Capillary Blood Glucose: No results found for this or any previous visit (from the past 24 hours).    Social History   Tobacco Use  Smoking Status Never  Smokeless Tobacco Former   Types: Chew   Quit date: 03/03/1975  Tobacco Comments   didn't chew much when I did; just a little bit    Goals Met:  Independence with exercise equipment Exercise tolerated well No report of concerns or symptoms today Strength training completed today  Goals Unmet:  Not Applicable  Comments: First full day of exercise!  Patient was oriented to gym and equipment including functions, settings, policies, and procedures.  Patient's individual exercise prescription and treatment plan were reviewed.  All starting workloads were established based on the results of the 6 minute walk test done at initial orientation visit.  The plan for exercise progression was also introduced and progression will be customized based on patient's  performance and goals.

## 2023-12-23 ENCOUNTER — Encounter (HOSPITAL_COMMUNITY)
Admission: RE | Admit: 2023-12-23 | Discharge: 2023-12-23 | Disposition: A | Discharge status: Home / Self Care | Source: Ambulatory Visit | Attending: Cardiology | Admitting: Cardiology

## 2023-12-23 DIAGNOSIS — Z952 Presence of prosthetic heart valve: Secondary | ICD-10-CM

## 2023-12-23 LAB — GLUCOSE, CAPILLARY
Glucose-Capillary: 140 mg/dL — ABNORMAL HIGH (ref 70–99)
Glucose-Capillary: 122 mg/dL — ABNORMAL HIGH (ref 70–99)

## 2023-12-23 NOTE — Progress Notes (Signed)
 Daily Session Note  Patient Details  Name: Andrew SZETO Sr. MRN: [id number] Date of Birth: [birth year] Referring Provider:   Flowsheet Row CARDIAC REHAB PHASE II ORIENTATION from 12/07/2023 in [facility] CARDIAC REHABILITATION  Referring Provider Shyrl [place] MD  Bevin Pole MD]    Encounter Date: 12/23/2023  Check In:  Session Check In - 12/23/23 1030       Check-In   Supervising physician immediately available to respond to emergencies See telemetry face sheet for immediately available MD    Location [facility]    Staff Present Powell Benders, BS, Exercise Physiologist;Jessica Vonzell, MA, RCEP, CCRP, CCET;Victoria Quimby, RN    Virtual Visit No    Medication changes reported     No    Fall or balance concerns reported    No    Tobacco Cessation No Change    Warm-up and Cool-down Performed on first and last piece of equipment    Resistance Training Performed Yes    VAD Patient? No    PAD/SET Patient? No      Pain Assessment   Currently in Pain? No/denies    Multiple Pain Sites No          Capillary Blood Glucose: Results for orders placed or performed during the hospital encounter of 12/23/23 (from the past 24 hours)  Glucose, capillary     Status: Abnormal   Collection Time: 12/23/23 10:23 AM  Result Value Ref Range   Glucose-Capillary 140 (H) 70 - 99 mg/dL  Glucose, capillary     Status: Abnormal   Collection Time: 12/23/23 11:24 AM  Result Value Ref Range   Glucose-Capillary 122 (H) 70 - 99 mg/dL      Social History   Tobacco Use  Smoking Status Never  Smokeless Tobacco Former   Types: Chew   Quit date: 03/03/1975  Tobacco Comments   didn't chew much when I did; just a little bit    Goals Met:  Independence with exercise equipment Exercise tolerated well No report of concerns or symptoms today Strength training completed today  Goals Unmet:  Not Applicable  Comments: Pt able to follow exercise prescription today  without complaint.  Will continue to monitor for progression.

## 2023-12-28 ENCOUNTER — Encounter (HOSPITAL_COMMUNITY)

## 2023-12-30 ENCOUNTER — Encounter (HOSPITAL_COMMUNITY)

## 2023-12-31 ENCOUNTER — Ambulatory Visit

## 2024-01-04 ENCOUNTER — Encounter (HOSPITAL_COMMUNITY)
Admission: RE | Admit: 2024-01-04 | Discharge: 2024-01-04 | Disposition: A | Discharge status: Home / Self Care | Source: Ambulatory Visit | Attending: Cardiology | Admitting: Cardiology

## 2024-01-04 DIAGNOSIS — Z952 Presence of prosthetic heart valve: Secondary | ICD-10-CM | POA: Diagnosis not present

## 2024-01-04 NOTE — Progress Notes (Signed)
 Daily Session Note  Patient Details  Name: Andrew MANCILLAS Sr. MRN: [id number] Date of Birth: January 28, 1941 Referring Provider:   Flowsheet Row CARDIAC REHAB PHASE II ORIENTATION from 12/07/2023 in [facility] CARDIAC REHABILITATION  Referring Provider Shyrl [place] MD  Bevin Pole MD]    Encounter Date: 01/04/2024  Check In:  Session Check In - 01/04/24 1034       Check-In   Supervising physician immediately available to respond to emergencies See telemetry face sheet for immediately available MD    Location [facility]    Staff Present Laymon Rattler, BSN, RN, WTA-C;Victoria Zina, RN;Jessica [place], MA, RCEP, CCRP, CCET    Virtual Visit No    Medication changes reported     No    Fall or balance concerns reported    No    Tobacco Cessation No Change    Warm-up and Cool-down Performed on first and last piece of equipment    Resistance Training Performed Yes    VAD Patient? No    PAD/SET Patient? No      Pain Assessment   Currently in Pain? No/denies          Capillary Blood Glucose: No results found for this or any previous visit (from the past 24 hours).    Social History   Tobacco Use  Smoking Status Never  Smokeless Tobacco Former   Types: Chew   Quit date: 03/03/1975  Tobacco Comments   didn't chew much when I did; just a little bit    Goals Met:  Independence with exercise equipment Exercise tolerated well No report of concerns or symptoms today Strength training completed today  Goals Unmet:  Not Applicable  Comments: Pt able to follow exercise prescription today without complaint.  Will continue to monitor for progression.

## 2024-01-05 ENCOUNTER — Encounter (HOSPITAL_COMMUNITY): Payer: Self-pay

## 2024-01-05 DIAGNOSIS — Z952 Presence of prosthetic heart valve: Secondary | ICD-10-CM

## 2024-01-05 NOTE — Progress Notes (Signed)
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 with the program and follow exercise prescription    Comments During the phone call I spoke with [name] Agent wife as he was lying down. [name] was a very pleasant lady who expresses Sina is excited to come to the program. 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 He is independent with all ADL's, and has been in good shape up until this recent surgery. [name] expresses that since the surgery he has been kind of weak and taking it easy. He used to exercise some at home with his wife until the surgery. He had some neck and shoulder pain after the surgery that he had to go to the ER for, but has a follow up appt with Ortho tomorrow to make sure nothing is wrong from their standpoint. 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 His wife [name] is his good  support system.    Expected Outcomes Short: Build strength back up. Long: Increase strength and stamina.    Continue Psychosocial Services  Follow up required by staff          Psychosocial Re-Evaluation:  Psychosocial Re-Evaluation     Row Name 01/04/24 1050             Psychosocial Re-Evaluation   Current issues with None Identified       Comments Kitai currently identifies no stressors in his life. He is also sleeping well.       Expected Outcomes Short; Continue to attend rehab. Long: Continue to have stress free lifestyle.       Interventions Encouraged to attend Cardiac Rehabilitation for the exercise       Continue Psychosocial Services  Follow up required by staff          Psychosocial Discharge (Final Psychosocial Re-Evaluation):  Psychosocial Re-Evaluation - 01/04/24 1050       Psychosocial Re-Evaluation   Current issues with None Identified  Comments Adley currently identifies no stressors in his life. He is also sleeping well.    Expected Outcomes Short; Continue to attend rehab. Long: Continue to have stress free lifestyle.    Interventions Encouraged to attend Cardiac Rehabilitation for the exercise    Continue Psychosocial Services  Follow up required by staff          Vocational Rehabilitation: Provide vocational rehab assistance to qualifying candidates.   Vocational Rehab Evaluation & Intervention:  Vocational Rehab - 11/02/23 1324       Initial Vocational Rehab Evaluation & Intervention   Assessment shows need for Vocational Rehabilitation No          Education: Education Goals: Education classes will be provided on a variety of topics geared toward better understanding of heart health and risk factor modification. Participant will state understanding/return demonstration of topics presented as noted by education test scores.  Learning Barriers/Preferences:  Learning Barriers/Preferences - 11/02/23 1325       Learning  Barriers/Preferences   Learning Barriers None    Learning Preferences None          General Cardiac Education Topics:  AED/CPR: - Group verbal and written instruction with the use of models to demonstrate the basic use of the AED with the basic ABC's of resuscitation.   Test and Procedures: - Group verbal and visual presentation and models provide information about basic cardiac anatomy and function. Reviews the testing methods done to diagnose heart disease and the outcomes of the test results. Describes the treatment choices: Medical Management, Angioplasty, or Coronary Bypass Surgery for treating various heart conditions including Myocardial Infarction, Angina, Valve Disease, and Cardiac Arrhythmias. Written material provided at class time.   Medication Safety: - Group verbal and visual instruction to review commonly prescribed medications for heart and lung disease. Reviews the medication, class of the drug, and side effects. Includes the steps to properly store meds and maintain the prescription regimen. Written material provided at class time.   Intimacy: - Group verbal instruction through game format to discuss how heart and lung disease can affect sexual intimacy. Written material provided at class time.   Know Your Numbers and Heart Failure: - Group verbal and visual instruction to discuss disease risk factors for cardiac and pulmonary disease and treatment options.  Reviews associated critical values for Overweight/Obesity, Hypertension, Cholesterol, and Diabetes.  Discusses basics of heart failure: signs/symptoms and treatments.  Introduces Heart Failure Zone chart for action plan for heart failure. Written material provided at class time.   Infection Prevention: - Provides verbal and written material to individual with discussion of infection control including proper hand washing and proper equipment cleaning during exercise session.   Falls Prevention: - Provides  verbal and written material to individual with discussion of falls prevention and safety.   Other: -Provides group and verbal instruction on various topics (see comments)   Knowledge Questionnaire Score:  Knowledge Questionnaire Score - 12/07/23 1323       Knowledge Questionnaire Score   Pre Score 25/26          Core Components/Risk Factors/Patient Goals at Admission:  Personal Goals and Risk Factors at Admission - 11/02/23 1324       Core Components/Risk Factors/Patient Goals on Admission   Diabetes Yes    Intervention Provide education about signs/symptoms and action to take for hypo/hyperglycemia.;Provide education about proper nutrition, including hydration, and aerobic/resistive exercise prescription along with prescribed medications to achieve blood glucose in normal ranges: Fasting glucose 65-99 mg/dL  Expected Outcomes Short Term: Participant verbalizes understanding of the signs/symptoms and immediate care of hyper/hypoglycemia, proper foot care and importance of medication, aerobic/resistive exercise and nutrition plan for blood glucose control.;Long Term: Attainment of HbA1C < 7%.    Heart Failure Yes    Intervention Provide a combined exercise and nutrition program that is supplemented with education, support and counseling about heart failure. Directed toward relieving symptoms such as shortness of breath, decreased exercise tolerance, and extremity edema.    Expected Outcomes Improve functional capacity of life;Short term: Attendance in program 2-3 days a week with increased exercise capacity. Reported lower sodium intake. Reported increased fruit and vegetable intake. Reports medication compliance.;Short term: Daily weights obtained and reported for increase. Utilizing diuretic protocols set by physician.;Long term: Adoption of self-care skills and reduction of barriers for early signs and symptoms recognition and intervention leading to self-care maintenance.     Hypertension Yes    Intervention Monitor prescription use compliance.;Provide education on lifestyle modifcations including regular physical activity/exercise, weight management, moderate sodium restriction and increased consumption of fresh fruit, vegetables, and low fat dairy, alcohol moderation, and smoking cessation.    Expected Outcomes Short Term: Continued assessment and intervention until BP is < 140/50mm HG in hypertensive participants. < 130/69mm HG in hypertensive participants with diabetes, heart failure or chronic kidney disease.;Long Term: Maintenance of blood pressure at goal levels.    Lipids Yes    Intervention Provide education and support for participant on nutrition & aerobic/resistive exercise along with prescribed medications to achieve LDL 70mg , HDL >40mg .    Expected Outcomes Long Term: Cholesterol controlled with medications as prescribed, with individualized exercise RX and with personalized nutrition plan. Value goals: LDL < 70mg , HDL > 40 mg.;Short Term: Participant states understanding of desired cholesterol values and is compliant with medications prescribed. Participant is following exercise prescription and nutrition guidelines.          Education:Diabetes - Individual verbal and written instruction to review signs/symptoms of diabetes, desired ranges of glucose level fasting, after meals and with exercise. Acknowledge that pre and post exercise glucose checks will be done for 3 sessions at entry of program.   Core Components/Risk Factors/Patient Goals Review:   Goals and Risk Factor Review     Row Name 01/04/24 1051             Core Components/Risk Factors/Patient Goals Review   Personal Goals Review Hypertension;Lipids;Diabetes       Review Amond is doing well in rehab. He checks his BP at home and it runs well except only one reading has been elevated. He takes all of his medicines as he should. He takes his blood sugar on the regular as well.        Expected Outcomes Short: Continue to attend rehab. Long: Continue taking vitals at home and report any abnormalities to his doctor.          Core Components/Risk Factors/Patient Goals at Discharge (Final Review):   Goals and Risk Factor Review - 01/04/24 1051       Core Components/Risk Factors/Patient Goals Review   Personal Goals Review Hypertension;Lipids;Diabetes    Review Obe is doing well in rehab. He checks his BP at home and it runs well except only one reading has been elevated. He takes all of his medicines as he should. He takes his blood sugar on the regular as well.    Expected Outcomes Short: Continue to attend rehab. Long: Continue taking vitals at home and report any abnormalities to  his doctor.          ITP Comments:  ITP Comments     Row Name 11/02/23 1314 12/07/23 1331 12/08/23 1526 12/21/23 1016 01/05/24 0853   ITP Comments Completed virtual orientation today.  EP evaluation is scheduled for 11/04/23 at 1330 .  Documentation for diagnosis can be found in [facility] encounter 10/12/23. Patient attend orientation today.  Patient is attending Cardiac Rehabilitation Program.  Documentation for diagnosis can be found in [facility].  Reviewed medical chart, RPE/RPD, gym safety, and program guidelines.  Patient was fitted to equipment they will be using during rehab.  Patient is scheduled to start exercise on 12/14/23.   Initial ITP created and sent for review and signature by Dr. Dorn Ross, Medical Director for Cardiac Rehabilitation Program. 30 day review completed. ITP sent to Dr. Dorn Ross, Medical Director of Cardiac Rehab. Continue with ITP unless changes are made by physician.  Pt has only completed orientation thus far. First full day of exercise!  Patient was oriented to gym and equipment including functions, settings, policies, and procedures.  Patient's individual exercise prescription and treatment plan were reviewed.  All starting workloads were established based on the  results of the 6 minute walk test done at initial orientation visit.  The plan for exercise progression was also introduced and progression will be customized based on patient's performance and goals. 30 day review completed. ITP sent to Dr. Dorn Ross, Medical Director of Cardiac Rehab. Continue with ITP unless changes are made by physician.  New to the program.      Comments: 30 day review

## 2024-01-06 ENCOUNTER — Encounter (HOSPITAL_COMMUNITY)
Admission: RE | Admit: 2024-01-06 | Discharge: 2024-01-06 | Disposition: A | Discharge status: Home / Self Care | Source: Ambulatory Visit | Attending: Cardiology | Admitting: Cardiology

## 2024-01-06 DIAGNOSIS — Z952 Presence of prosthetic heart valve: Secondary | ICD-10-CM | POA: Diagnosis not present

## 2024-01-06 NOTE — Progress Notes (Signed)
 Daily Session Note  Patient Details  Name: JAVONTA GRONAU Sr. MRN: [id number] Date of Birth: [birth year] Referring Provider:   Flowsheet Row CARDIAC REHAB PHASE II ORIENTATION from 12/07/2023 in [facility] CARDIAC REHABILITATION  Referring Provider Shyrl [place] MD  Bevin Pole MD]    Encounter Date: 01/06/2024  Check In:  Session Check In - 01/06/24 1015       Check-In   Supervising physician immediately available to respond to emergencies See telemetry face sheet for immediately available MD    Location [facility]    Staff Present Rolland Sake BSN, RN;Mariha Sleeper Vicci, RN, BSN;Heather Con, BS, Exercise Physiologist    Virtual Visit No    Medication changes reported     No    Fall or balance concerns reported    No    Warm-up and Cool-down Performed on first and last piece of equipment    Resistance Training Performed Yes    VAD Patient? No    PAD/SET Patient? No      Pain Assessment   Currently in Pain? No/denies    Multiple Pain Sites No          Capillary Blood Glucose: No results found for this or any previous visit (from the past 24 hours).    Social History   Tobacco Use  Smoking Status Never  Smokeless Tobacco Former   Types: Chew   Quit date: 03/03/1975  Tobacco Comments   didn't chew much when I did; just a little bit    Goals Met:  Independence with exercise equipment Exercise tolerated well No report of concerns or symptoms today Strength training completed today  Goals Unmet:  Not Applicable  Comments: Pt able to follow exercise prescription today without complaint.  Will continue to monitor for progression.

## 2024-01-11 ENCOUNTER — Encounter (HOSPITAL_COMMUNITY)

## 2024-01-12 ENCOUNTER — Encounter (HOSPITAL_COMMUNITY)
Admission: RE | Admit: 2024-01-12 | Discharge: 2024-01-12 | Disposition: A | Discharge status: Home / Self Care | Source: Ambulatory Visit | Attending: Cardiology | Admitting: Cardiology

## 2024-01-12 DIAGNOSIS — Z952 Presence of prosthetic heart valve: Secondary | ICD-10-CM

## 2024-01-12 NOTE — Progress Notes (Signed)
 Daily Session Note  Patient Details  Name: Andrew CONGROVE Sr. MRN: [id number] Date of Birth: [birth year] Referring Provider:   Flowsheet Row CARDIAC REHAB PHASE II ORIENTATION from 12/07/2023 in [facility] CARDIAC REHABILITATION  Referring Provider Shyrl [place] MD  Bevin Pole MD]    Encounter Date: 01/12/2024  Check In:  Session Check In - 01/12/24 1038       Check-In   Supervising physician immediately available to respond to emergencies See telemetry face sheet for immediately available MD    Location [facility]    Staff Present Powell Benders, BS, Exercise Physiologist;Brittany Jackquline, BSN, RN, Rosalba Gelineau, MA, RCEP, CCRP, CCET    Virtual Visit No    Medication changes reported     No    Fall or balance concerns reported    No    Tobacco Cessation No Change    Warm-up and Cool-down Performed on first and last piece of equipment    Resistance Training Performed Yes    VAD Patient? No    PAD/SET Patient? No      Pain Assessment   Currently in Pain? No/denies    Multiple Pain Sites No          Capillary Blood Glucose: No results found for this or any previous visit (from the past 24 hours).    Social History   Tobacco Use  Smoking Status Never  Smokeless Tobacco Former   Types: Chew   Quit date: 03/03/1975  Tobacco Comments   didn't chew much when I did; just a little bit    Goals Met:  Independence with exercise equipment Using PLB without cueing & demonstrates good technique Exercise tolerated well Queuing for purse lip breathing No report of concerns or symptoms today Strength training completed today  Goals Unmet:  Not Applicable  Comments: Pt able to follow exercise prescription today without complaint.  Will continue to monitor for progression.

## 2024-01-13 ENCOUNTER — Encounter (HOSPITAL_COMMUNITY)
Admission: RE | Admit: 2024-01-13 | Discharge: 2024-01-13 | Disposition: A | Discharge status: Home / Self Care | Source: Ambulatory Visit | Attending: Cardiology | Admitting: Cardiology

## 2024-01-13 DIAGNOSIS — Z952 Presence of prosthetic heart valve: Secondary | ICD-10-CM | POA: Diagnosis not present

## 2024-01-13 NOTE — Progress Notes (Signed)
 Daily Session Note  Patient Details  Name: Andrew KOORS Sr. MRN: [id number] Date of Birth: 1940/12/28 Referring Provider:   Flowsheet Row CARDIAC REHAB PHASE II ORIENTATION from 12/07/2023 in [facility] CARDIAC REHABILITATION  Referring Provider Shyrl [place] MD  Bevin Pole MD]    Encounter Date: 01/13/2024  Check In:  Session Check In - 01/13/24 1033       Check-In   Supervising physician immediately available to respond to emergencies See telemetry face sheet for immediately available MD    Location [facility]    Staff Present Powell Benders, BS, Exercise Physiologist;Hillary Dean BSN, RN    Virtual Visit No    Medication changes reported     No    Fall or balance concerns reported    No    Tobacco Cessation No Change    Warm-up and Cool-down Performed on first and last piece of equipment    Resistance Training Performed Yes    VAD Patient? No    PAD/SET Patient? No      Pain Assessment   Currently in Pain? No/denies    Multiple Pain Sites No          Capillary Blood Glucose: No results found for this or any previous visit (from the past 24 hours).    Social History   Tobacco Use  Smoking Status Never  Smokeless Tobacco Former   Types: Chew   Quit date: 03/03/1975  Tobacco Comments   didn't chew much when I did; just a little bit    Goals Met:  Independence with exercise equipment Exercise tolerated well No report of concerns or symptoms today Strength training completed today  Goals Unmet:  Not Applicable  Comments: Pt able to follow exercise prescription today without complaint.  Will continue to monitor for progression.

## 2024-01-18 ENCOUNTER — Encounter (HOSPITAL_COMMUNITY)
Admission: RE | Admit: 2024-01-18 | Discharge: 2024-01-18 | Disposition: A | Discharge status: Home / Self Care | Source: Ambulatory Visit | Attending: Cardiology | Admitting: Cardiology

## 2024-01-18 DIAGNOSIS — Z952 Presence of prosthetic heart valve: Secondary | ICD-10-CM

## 2024-01-18 NOTE — Progress Notes (Signed)
 Daily Session Note  Patient Details  Name: GAR GLANCE Sr. MRN: [id number] Date of Birth: 12-Jun-1940 Referring Provider:   Flowsheet Row CARDIAC REHAB PHASE II ORIENTATION from 12/07/2023 in [facility] CARDIAC REHABILITATION  Referring Provider Shyrl [place] MD  Bevin Pole MD]    Encounter Date: 01/18/2024  Check In:  Session Check In - 01/18/24 1025       Check-In   Supervising physician immediately available to respond to emergencies See telemetry face sheet for immediately available MD    Location [facility]    Staff Present Powell Benders, BS, Exercise Physiologist;Brittany Jackquline, BSN, RN, WTA-C;Christropher Gintz Zina, RN    Virtual Visit No    Medication changes reported     No    Fall or balance concerns reported    No    Warm-up and Cool-down Performed on first and last piece of equipment    Resistance Training Performed Yes    VAD Patient? No    PAD/SET Patient? No      Pain Assessment   Currently in Pain? No/denies          Capillary Blood Glucose: No results found for this or any previous visit (from the past 24 hours).    Social History   Tobacco Use  Smoking Status Never  Smokeless Tobacco Former   Types: Chew   Quit date: 03/03/1975  Tobacco Comments   didn't chew much when I did; just a little bit    Goals Met:  Independence with exercise equipment Exercise tolerated well No report of concerns or symptoms today Strength training completed today  Goals Unmet:  Not Applicable  Comments: Pt able to follow exercise prescription today without complaint.  Will continue to monitor for progression.

## 2024-01-19 ENCOUNTER — Ambulatory Visit (INDEPENDENT_AMBULATORY_CARE_PROVIDER_SITE_OTHER): Admitting: Urology

## 2024-01-19 VITALS — BP 130/80 | HR 74 | Ht 70.0 in | Wt 188.0 lb

## 2024-01-19 DIAGNOSIS — N401 Enlarged prostate with lower urinary tract symptoms: Secondary | ICD-10-CM

## 2024-01-19 LAB — BLADDER SCAN AMB NON-IMAGING: Scan Result: 0

## 2024-01-19 LAB — MICROSCOPIC EXAMINATION

## 2024-01-19 NOTE — Patient Instructions (Signed)
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
## 2024-01-20 ENCOUNTER — Ambulatory Visit: Admitting: Internal Medicine

## 2024-01-20 ENCOUNTER — Encounter (HOSPITAL_COMMUNITY): Admission: RE | Admit: 2024-01-20 | Source: Ambulatory Visit

## 2024-01-20 ENCOUNTER — Encounter (HOSPITAL_COMMUNITY): Payer: Self-pay

## 2024-01-20 LAB — MICROSCOPIC EXAMINATION: RBC, Urine: NONE SEEN /HPF (ref 0–2)

## 2024-01-20 LAB — URINALYSIS, COMPLETE
Bilirubin, UA: NEGATIVE
Ketones, UA: NEGATIVE
Leukocytes,UA: NEGATIVE
Nitrite, UA: NEGATIVE
Protein,UA: NEGATIVE
Specific Gravity, UA: 1.015 (ref 1.005–1.030)
Urobilinogen, Ur: 0.2 mg/dL (ref 0.2–1.0)
pH, UA: 5.5 (ref 5.0–7.5)

## 2024-01-25 ENCOUNTER — Other Ambulatory Visit: Payer: Self-pay

## 2024-01-25 ENCOUNTER — Encounter (HOSPITAL_COMMUNITY)

## 2024-01-25 DIAGNOSIS — N138 Other obstructive and reflux uropathy: Secondary | ICD-10-CM

## 2024-01-25 NOTE — Progress Notes (Unsigned)
 Surgical Physician Order [facility]  Dr. Redell Burnet, MD  * Scheduling expectation : January or February 2026  *Length of Case: 1 hour  *Clearance needed: yes, cardiology  *Anticoagulation Instructions: Hold all anticoagulants  *Aspirin  Instructions: Can continue 81 mg aspirin  if needed  *Post-op visit Date/Instructions:  1-3 day cath removal  *Diagnosis: BPH w/urinary obstruction  *Procedure:  HOLEP (47350)   Additional orders: N/A  -Admit type: OUTpatient  -Anesthesia: General  -VTE Prophylaxis Standing Order SCD's       Other:   -Standing Lab Orders Per Anesthesia    Lab other: UA&Urine Culture  -Standing Test orders EKG/Chest x-ray per Anesthesia       Test other:   - Medications:  Ancef  2gm IV  -Other orders:  N/A

## 2024-01-25 NOTE — Progress Notes (Unsigned)
 Patient doesn't feel comfortable scheduling surgery until cleared with Cardiology- prefers to see them in January and I will follow up with him the next week. Will monitor for updates/changes.

## 2024-02-01 ENCOUNTER — Encounter (HOSPITAL_COMMUNITY)
Admission: RE | Admit: 2024-02-01 | Discharge: 2024-02-01 | Disposition: A | Source: Ambulatory Visit | Attending: Cardiology

## 2024-02-01 ENCOUNTER — Encounter (HOSPITAL_COMMUNITY): Payer: Self-pay | Admitting: *Deleted

## 2024-02-01 DIAGNOSIS — Z952 Presence of prosthetic heart valve: Secondary | ICD-10-CM | POA: Insufficient documentation

## 2024-02-01 NOTE — Progress Notes (Signed)
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 No          Education: Education Goals: Education classes will be provided on a weekly basis, covering required topics. Participant will state understanding/return demonstration of topics presented.  Learning Barriers/Preferences:  Learning Barriers/Preferences - 11/02/23 1325       Learning Barriers/Preferences   Learning Barriers None    Learning Preferences None          Education Topics: Hypertension, Hypertension Reduction -Define heart disease and high blood pressure. Discus how high blood pressure affects the body and ways to reduce high blood pressure.   Exercise and Your Heart -Discuss why it is important to exercise, the  FITT principles of exercise, normal and abnormal responses to exercise, and how to exercise safely.   Angina -Discuss definition of  angina, causes of angina, treatment of angina, and how to decrease risk of having angina.   Cardiac Medications -Review what the following cardiac medications are used for, how they affect the body, and side effects that may occur when taking the medications.  Medications include Aspirin , Beta blockers, calcium  channel blockers, ACE Inhibitors, angiotensin receptor blockers, diuretics, digoxin, and antihyperlipidemics.   Congestive Heart Failure -Discuss the definition of CHF, how to live with CHF, the signs and symptoms of CHF, and how keep track of weight and sodium intake.   Heart Disease and Intimacy -Discus the effect sexual activity has on the heart, how changes occur during intimacy as we age, and safety during sexual activity.   Smoking Cessation / COPD -Discuss different methods to quit smoking, the health benefits of quitting smoking, and the definition of COPD.   Nutrition I: Fats -Discuss the types of cholesterol, what cholesterol does to the heart, and how cholesterol levels can be controlled.   Nutrition II: Labels -Discuss the different components of food labels and how to read food label   Heart Parts/Heart Disease and PAD -Discuss the anatomy of the heart, the pathway of blood circulation through the heart, and these are affected by heart disease.   Stress I: Signs and Symptoms -Discuss the causes of stress, how stress may lead to anxiety and depression, and ways to limit stress.   Stress II: Relaxation -Discuss different types of relaxation techniques to limit stress.   Warning Signs of Stroke / TIA -Discuss definition of a stroke, what the signs and symptoms are of a stroke, and how to identify when someone is having stroke.   Knowledge Questionnaire Score:  Knowledge Questionnaire Score - 12/07/23 1323       Knowledge Questionnaire Score   Pre Score 25/26          Core Components/Risk Factors/Patient Goals at Admission:  Personal Goals and  Risk Factors at Admission - 11/02/23 1324       Core Components/Risk Factors/Patient Goals on Admission   Diabetes Yes    Intervention Provide education about signs/symptoms and action to take for hypo/hyperglycemia.;Provide education about proper nutrition, including hydration, and aerobic/resistive exercise prescription along with prescribed medications to achieve blood glucose in normal ranges: Fasting glucose 65-99 mg/dL    Expected Outcomes Short Term: Participant verbalizes understanding of the signs/symptoms and immediate care of hyper/hypoglycemia, proper foot care and importance of medication, aerobic/resistive exercise and nutrition plan for blood glucose control.;Long Term: Attainment of HbA1C < 7%.    Heart Failure Yes    Intervention Provide a combined exercise and nutrition program that is supplemented with education, support and counseling about heart failure. Directed toward relieving symptoms such as shortness of breath, decreased exercise tolerance, and extremity edema.    Expected Outcomes Improve functional capacity of life;Short term: Attendance in program 2-3 days a week with increased exercise capacity. Reported lower sodium intake. Reported increased fruit and vegetable intake. Reports medication compliance.;Short term: Daily weights obtained and reported for increase. Utilizing diuretic protocols set by physician.;Long term: Adoption of self-care skills and reduction of barriers for early signs and symptoms recognition and intervention leading to self-care maintenance.    Hypertension Yes    Intervention Monitor prescription use compliance.;Provide education on lifestyle modifcations including regular physical activity/exercise, weight management, moderate sodium restriction and increased consumption of fresh fruit, vegetables, and low fat  dairy, alcohol moderation, and smoking cessation.    Expected Outcomes Short Term: Continued assessment and intervention until BP is <  140/27mm HG in hypertensive participants. < 130/24mm HG in hypertensive participants with diabetes, heart failure or chronic kidney disease.;Long Term: Maintenance of blood pressure at goal levels.    Lipids Yes    Intervention Provide education and support for participant on nutrition & aerobic/resistive exercise along with prescribed medications to achieve LDL 70mg , HDL >40mg .    Expected Outcomes Long Term: Cholesterol controlled with medications as prescribed, with individualized exercise RX and with personalized nutrition plan. Value goals: LDL < 70mg , HDL > 40 mg.;Short Term: Participant states understanding of desired cholesterol values and is compliant with medications prescribed. Participant is following exercise prescription and nutrition guidelines.          Core Components/Risk Factors/Patient Goals Review:   Goals and Risk Factor Review     Row Name 01/04/24 1051             Core Components/Risk Factors/Patient Goals Review   Personal Goals Review Hypertension;Lipids;Diabetes       Review Lon is doing well in rehab. He checks his BP at home and it runs well except only one reading has been elevated. He takes all of his medicines as he should. He takes his blood sugar on the regular as well.       Expected Outcomes Short: Continue to attend rehab. Long: Continue taking vitals at home and report any abnormalities to his doctor.          Core Components/Risk Factors/Patient Goals at Discharge (Final Review):   Goals and Risk Factor Review - 01/04/24 1051       Core Components/Risk Factors/Patient Goals Review   Personal Goals Review Hypertension;Lipids;Diabetes    Review Moussa is doing well in rehab. He checks his BP at home and it runs well except only one reading has been elevated. He takes all of his medicines as he should. He takes his blood sugar on the regular as well.    Expected Outcomes Short: Continue to attend rehab. Long: Continue taking vitals at home and  report any abnormalities to his doctor.          ITP Comments:  ITP Comments     Row Name 11/02/23 1314 12/07/23 1331 12/08/23 1526 12/21/23 1016 01/05/24 0853   ITP Comments Completed virtual orientation today.  EP evaluation is scheduled for 11/04/23 at 1330 .  Documentation for diagnosis can be found in [facility] encounter 10/12/23. Patient attend orientation today.  Patient is attending Cardiac Rehabilitation Program.  Documentation for diagnosis can be found in [facility].  Reviewed medical chart, RPE/RPD, gym safety, and program guidelines.  Patient was fitted to equipment they will be using during rehab.  Patient is scheduled to start exercise on 12/14/23.   Initial ITP created and sent for review and signature by Dr. Dorn Ross, Medical Director for Cardiac Rehabilitation Program. 30 day review completed. ITP sent to Dr. Dorn Ross, Medical Director of Cardiac Rehab. Continue with ITP unless changes are made by physician.  Pt has only completed orientation thus far. First full day of exercise!  Patient was oriented to gym and equipment including functions, settings, policies, and procedures.  Patient's individual exercise prescription and treatment plan were reviewed.  All starting workloads were established based on the results of the 6 minute walk test done at initial orientation visit.  The plan for exercise progression was also introduced and progression will be customized  based on patient's performance and goals. 30 day review completed. ITP sent to Dr. Dorn Ross, Medical Director of Cardiac Rehab. Continue with ITP unless changes are made by physician.  New to the program.    Row Name 02/01/24 1048           ITP Comments 30 day review completed. ITP sent to Dr. Dorn Ross, Medical Director of Cardiac Rehab. Continue with ITP unless changes are made by physician. Unable to assess for goals, out since 01/18/24, returned today.          Comments: 30 day review

## 2024-02-01 NOTE — Progress Notes (Signed)
 Daily Session Note  Patient Details  Name: Andrew FEBO Sr. MRN: [id number] Date of Birth: [birth year] Referring Provider:   Flowsheet Row CARDIAC REHAB PHASE II ORIENTATION from 12/07/2023 in [facility] CARDIAC REHABILITATION  Referring Provider Shyrl Easton MD  Bevin Pole MD]    Encounter Date: 02/01/2024  Check In:  Session Check In - 02/01/24 1035       Check-In   Supervising physician immediately available to respond to emergencies See telemetry face sheet for immediately available MD    Location [facility]    Staff Present Powell Benders, BS, Exercise Physiologist;Brittany Jackquline, BSN, RN, WTA-C;Lloyde Ludlam Zina, RN    Virtual Visit No    Medication changes reported     No    Fall or balance concerns reported    No    Warm-up and Cool-down Performed on first and last piece of equipment    Resistance Training Performed Yes    VAD Patient? No    PAD/SET Patient? No      Pain Assessment   Currently in Pain? No/denies          Capillary Blood Glucose: No results found for this or any previous visit (from the past 24 hours).    Social History   Tobacco Use  Smoking Status Never  Smokeless Tobacco Former   Types: Chew   Quit date: 03/03/1975  Tobacco Comments   didn't chew much when I did; just a little bit    Goals Met:  Independence with exercise equipment Exercise tolerated well No report of concerns or symptoms today Strength training completed today  Goals Unmet:  Not Applicable  Comments: Pt able to follow exercise prescription today without complaint.  Will continue to monitor for progression.

## 2024-02-03 ENCOUNTER — Telehealth: Payer: Self-pay

## 2024-02-03 ENCOUNTER — Encounter (HOSPITAL_COMMUNITY)
Admission: RE | Admit: 2024-02-03 | Discharge: 2024-02-03 | Disposition: A | Source: Ambulatory Visit | Attending: Cardiology

## 2024-02-03 DIAGNOSIS — Z952 Presence of prosthetic heart valve: Secondary | ICD-10-CM | POA: Diagnosis not present

## 2024-02-03 NOTE — Progress Notes (Signed)
 Daily Session Note  Patient Details  Name: Andrew WEIBLE Sr. MRN: [id number] Date of Birth: Nov 23, 1940 Referring Provider:   Flowsheet Row CARDIAC REHAB PHASE II ORIENTATION from 12/07/2023 in [facility] CARDIAC REHABILITATION  Referring Provider Shyrl McIntire MD  Bevin Pole MD]    Encounter Date: 02/03/2024  Check In:  Session Check In - 02/03/24 1034       Check-In   Supervising physician immediately available to respond to emergencies See telemetry face sheet for immediately available MD    Location [facility]    Staff Present Powell Benders, BS, Exercise Physiologist;Debra Vicci, RN, BSN;Jessica [place], MA, RCEP, CCRP, CCET;Sejla Marzano [employer], RN    Virtual Visit No    Medication changes reported     No    Fall or balance concerns reported    No    Tobacco Cessation No Change    Warm-up and Cool-down Performed on first and last piece of equipment    Resistance Training Performed Yes    VAD Patient? No    PAD/SET Patient? No      Pain Assessment   Currently in Pain? No/denies    Multiple Pain Sites No          Capillary Blood Glucose: No results found for this or any previous visit (from the past 24 hours).    Social History   Tobacco Use  Smoking Status Never  Smokeless Tobacco Former   Types: Chew   Quit date: 03/03/1975  Tobacco Comments   didn't chew much when I did; just a little bit    Goals Met:  Independence with exercise equipment Exercise tolerated well No report of concerns or symptoms today Strength training completed today  Goals Unmet:  Not Applicable  Comments: SABRASABRAPt able to follow exercise prescription today without complaint.  Will continue to monitor for progression.

## 2024-02-03 NOTE — Telephone Encounter (Signed)
 Copied from CRM #[id number]. Topic: Appointments - Scheduling Inquiry for Clinic >> Feb 03, 2024  9:52 AM Shanda MATSU wrote: Reason for CRM: Patient's wife called in to confirm that patient will b able to be sen at the same time as when she has her appt on 02/08/2024, patient's wife is scheduled for 02/08/24 @ 11am, but patient is scheduled for 02/08/2024 @ 2:20pm, caller stated she was adv patient would be able to be seen at the same time as her appt, patient is needing to confirm this info, is req a call back.

## 2024-02-08 ENCOUNTER — Encounter: Payer: Self-pay | Admitting: Internal Medicine

## 2024-02-08 ENCOUNTER — Ambulatory Visit: Admitting: Internal Medicine

## 2024-02-08 ENCOUNTER — Encounter (HOSPITAL_COMMUNITY)

## 2024-02-08 VITALS — BP 138/78 | HR 73 | Ht 70.0 in | Wt 187.0 lb

## 2024-02-08 DIAGNOSIS — N2889 Other specified disorders of kidney and ureter: Secondary | ICD-10-CM | POA: Diagnosis not present

## 2024-02-08 DIAGNOSIS — Z952 Presence of prosthetic heart valve: Secondary | ICD-10-CM

## 2024-02-08 DIAGNOSIS — E1142 Type 2 diabetes mellitus with diabetic polyneuropathy: Secondary | ICD-10-CM

## 2024-02-08 DIAGNOSIS — N401 Enlarged prostate with lower urinary tract symptoms: Secondary | ICD-10-CM | POA: Diagnosis not present

## 2024-02-08 DIAGNOSIS — E538 Deficiency of other specified B group vitamins: Secondary | ICD-10-CM

## 2024-02-08 DIAGNOSIS — Z7984 Long term (current) use of oral hypoglycemic drugs: Secondary | ICD-10-CM | POA: Diagnosis not present

## 2024-02-08 DIAGNOSIS — I10 Essential (primary) hypertension: Secondary | ICD-10-CM | POA: Diagnosis not present

## 2024-02-08 DIAGNOSIS — E119 Type 2 diabetes mellitus without complications: Secondary | ICD-10-CM

## 2024-02-08 DIAGNOSIS — F419 Anxiety disorder, unspecified: Secondary | ICD-10-CM

## 2024-02-08 LAB — COMPREHENSIVE METABOLIC PANEL WITH GFR
ALT: 19 U/L (ref 0–53)
AST: 16 U/L (ref 0–37)
Albumin: 4.7 g/dL (ref 3.5–5.2)
Alkaline Phosphatase: 74 U/L (ref 39–117)
BUN: 20 mg/dL (ref 6–23)
CO2: 30 meq/L (ref 19–32)
Calcium: 10.1 mg/dL (ref 8.4–10.5)
Chloride: 101 meq/L (ref 96–112)
Creatinine, Ser: 1.13 mg/dL (ref 0.40–1.50)
GFR: 61.01 mL/min (ref >60.00)
Glucose, Bld: 146 mg/dL — ABNORMAL HIGH (ref 70–99)
Potassium: 4.4 meq/L (ref 3.5–5.1)
Sodium: 140 meq/L (ref 135–145)
Total Bilirubin: 0.8 mg/dL (ref 0.2–1.2)
Total Protein: 7.5 g/dL (ref 6.0–8.3)

## 2024-02-08 LAB — CBC WITH DIFFERENTIAL/PLATELET
Basophils Absolute: 0 K/uL (ref 0.0–0.1)
Basophils Relative: 0.5 % (ref 0.0–3.0)
Eosinophils Absolute: 0.3 K/uL (ref 0.0–0.7)
Eosinophils Relative: 4.5 % (ref 0.0–5.0)
HCT: 37.4 % — ABNORMAL LOW (ref 39.0–52.0)
Hemoglobin: 12.6 g/dL — ABNORMAL LOW (ref 13.0–17.0)
Lymphocytes Relative: 27 % (ref 12.0–46.0)
Lymphs Abs: 1.9 K/uL (ref 0.7–4.0)
MCHC: 33.6 g/dL (ref 30.0–36.0)
MCV: 88.4 fl (ref 78.0–100.0)
Monocytes Absolute: 0.7 K/uL (ref 0.1–1.0)
Monocytes Relative: 10.3 % (ref 3.0–12.0)
Neutro Abs: 4 K/uL (ref 1.4–7.7)
Neutrophils Relative %: 57.7 % (ref 43.0–77.0)
Platelets: 197 K/uL (ref 150.0–400.0)
RBC: 4.23 Mil/uL (ref 4.22–5.81)
RDW: 13.7 % (ref 11.5–15.5)
WBC: 6.9 K/uL (ref 4.0–10.5)

## 2024-02-08 LAB — T4, FREE: Free T4: 0.91 ng/dL (ref 0.60–1.60)

## 2024-02-08 LAB — TSH: TSH: 1.22 u[IU]/mL (ref 0.35–5.50)

## 2024-02-08 LAB — CK: Total CK: 46 U/L (ref 17–232)

## 2024-02-08 LAB — HEMOGLOBIN A1C: Hgb A1c MFr Bld: 7.4 % — ABNORMAL HIGH (ref 4.6–6.5)

## 2024-02-08 MED ORDER — FINASTERIDE 5 MG PO TABS: 5.0000 mg | ORAL_TABLET | Freq: Every day | ORAL | 3 refills | Status: AC

## 2024-02-08 NOTE — Assessment & Plan Note (Signed)
Cont w/Citalopram 

## 2024-02-08 NOTE — Assessment & Plan Note (Signed)
 DOE is much better

## 2024-02-08 NOTE — Assessment & Plan Note (Signed)
Monitor A1c 

## 2024-02-08 NOTE — Assessment & Plan Note (Signed)
 On B12

## 2024-02-08 NOTE — Assessment & Plan Note (Signed)
Hydrate well Off HCTZ

## 2024-02-08 NOTE — Progress Notes (Signed)
 Subjective:  [name]: Andrew FORBES Koleen Chrystal., male    DOB: Aug 06, 1940  Age: 83 y.o. MRN: [id number]  CC: Medical Management of Chronic Issues (Follow up)   HPI Andrew FORBES Koleen Sr. presents for CAD, post-TAVR, HTN, CHF  Outpatient Medications Prior to Visit  Medication Sig Dispense Refill   acyclovir  (ZOVIRAX ) 200 MG capsule Take 1 capsule (200 mg total) by mouth 2 (two) times daily. 180 capsule 3   amLODipine  (NORVASC ) 5 MG tablet Take 1 tablet (5 mg total) by mouth daily. 90 tablet 3   ammonium lactate (LAC-HYDRIN) 12 % lotion APPLY TO THE TO THE AFFECTED AREA DAILY 400 g 0   amoxicillin  (AMOXIL ) 500 MG tablet Take 4 tablets (2,000 mg total) by mouth as directed. 1 hour prior to dental work including cleanings 12 tablet 12   aspirin  EC 81 MG tablet Take 1 tablet (81 mg total) by mouth daily. Swallow whole. 30 tablet 12   Cholecalciferol  1000 units tablet Take 1,000 Units by mouth daily.     citalopram  (CELEXA ) 10 MG tablet TAKE 1 TABLET(10 MG) BY MOUTH DAILY 90 tablet 3   clopidogrel  (PLAVIX ) 75 MG tablet TAKE 1 TABLET(75 MG) BY MOUTH DAILY 90 tablet 3   Cyanocobalamin  (VITAMIN B-12) 1000 MCG SUBL Place 1 tablet (1,000 mcg total) under the tongue daily. 100 tablet 3   ezetimibe  (ZETIA ) 10 MG tablet TAKE 1 TABLET(10 MG) BY MOUTH DAILY 90 tablet 2   glucose blood (ONETOUCH VERIO) test strip check blood sugar In Vitro once a day DX: E11.42 for 90 days     loperamide (IMODIUM) 2 MG capsule Take 2 mg by mouth daily as needed (Diarrhea).     metFORMIN (GLUCOPHAGE-XR) 500 MG 24 hr tablet Take 1,000 mg by mouth daily with supper.   3   metoprolol  succinate (TOPROL -XL) 25 MG 24 hr tablet Take 1 tablet (25 mg total) by mouth at bedtime. 90 tablet 1   nitroGLYCERIN  (NITROSTAT ) 0.4 MG SL tablet Place 1 tablet (0.4 mg total) under the tongue every 5 (five) minutes x 3 doses as needed for chest pain. 25 tablet 12   pantoprazole  (PROTONIX ) 40 MG tablet TAKE 1 TABLET BY MOUTH EVERY DAY 90 tablet 3    tamsulosin  (FLOMAX ) 0.4 MG CAPS capsule TAKE 1 CAPSULE(0.4 MG) BY MOUTH AT BEDTIME 90 capsule 3   ZYPITAMAG  4 MG TABS TAKE 1 TABLET BY MOUTH EVERY DAY 60 tablet 5   No facility-administered medications prior to visit.    ROS: Review of Systems  Constitutional:  Positive for fatigue. Negative for appetite change and unexpected weight change.  HENT:  Negative for congestion, nosebleeds, sneezing, sore throat and trouble swallowing.   Eyes:  Negative for itching and visual disturbance.  Respiratory:  Positive for shortness of breath. Negative for cough and wheezing.   Cardiovascular:  Negative for chest pain, palpitations and leg swelling.  Gastrointestinal:  Negative for abdominal distention, blood in stool, diarrhea and nausea.  Genitourinary:  Negative for frequency and hematuria.  Musculoskeletal:  Negative for back pain, gait problem, joint swelling and neck pain.  Skin:  Negative for rash.  Neurological:  Negative for dizziness, tremors, speech difficulty and weakness.  Psychiatric/Behavioral:  Negative for agitation, dysphoric mood and sleep disturbance. The patient is not nervous/anxious.     Objective:  BP 138/78   Pulse 73   Ht 5' 10 (1.778 m)   Wt 187 lb (84.8 kg)   SpO2 96%   BMI 26.83 kg/m  BP Readings from Last 3 Encounters:  02/08/24 138/78  01/19/24 130/80  11/25/23 128/68    Wt Readings from Last 3 Encounters:  02/08/24 187 lb (84.8 kg)  01/19/24 188 lb (85.3 kg)  12/07/23 185 lb 13.6 oz (84.3 kg)    Physical Exam Constitutional:      General: He is not in acute distress.    Appearance: Normal appearance. He is well-developed. He is not diaphoretic.     Comments: NAD  Eyes:     Conjunctiva/sclera: Conjunctivae normal.     Pupils: Pupils are equal, round, and reactive to light.  Neck:     Thyroid : No thyromegaly.     Vascular: No JVD.  Cardiovascular:     Rate and Rhythm: Normal rate and regular rhythm.     Heart sounds: Normal heart sounds. No  murmur heard.    No friction rub. No gallop.  Pulmonary:     Effort: Pulmonary effort is normal. No respiratory distress.     Breath sounds: Normal breath sounds. No wheezing or rales.  Chest:     Chest wall: No tenderness.  Abdominal:     General: Bowel sounds are normal. There is no distension.     Palpations: Abdomen is soft. There is no mass.     Tenderness: There is no abdominal tenderness. There is no guarding or rebound.  Musculoskeletal:        General: No tenderness. Normal range of motion.     Cervical back: Normal range of motion.     Right lower leg: No edema.     Left lower leg: No edema.  Lymphadenopathy:     Cervical: No cervical adenopathy.  Skin:    General: Skin is warm and dry.     Findings: No rash.  Neurological:     Mental Status: He is alert and oriented to person, place, and time.     Cranial Nerves: No cranial nerve deficit.     Motor: No abnormal muscle tone.     Coordination: Coordination normal.     Gait: Gait normal.     Deep Tendon Reflexes: Reflexes are normal and symmetric.  Psychiatric:        Behavior: Behavior normal.        Thought Content: Thought content normal.        Judgment: Judgment normal.     Lab Results  Component Value Date   WBC 5.1 10/30/2023   HGB 11.6 (L) 10/30/2023   HCT 35.0 (L) 10/30/2023   PLT 160 10/30/2023   GLUCOSE 145 (H) 10/30/2023   CHOL 121 08/31/2022   TRIG 120 08/31/2022   HDL 38 (L) 08/31/2022   LDLDIRECT 51.0 07/24/2016   LDLCALC 59 08/31/2022   ALT 17 10/30/2023   AST 17 10/30/2023   NA 140 10/30/2023   K 4.4 10/30/2023   CL 102 10/30/2023   CREATININE 1.23 10/30/2023   BUN 22 10/30/2023   CO2 27 10/30/2023   TSH 1.550 04/21/2022   PSA 1.75 07/24/2016   INR 1.0 10/08/2023   HGBA1C 7.7 (H) 04/08/2023    No results found.  Assessment & Plan:   Problem List Items Addressed This Visit     B12 deficiency   On B12      BPH (benign prostatic hyperplasia)   Worse. He does not want  surgery Pt saw a urologist On Flomax  Added Proscar       Relevant Medications   finasteride  (PROSCAR ) 5 MG tablet   Chronic anxiety  Cont w/Citalopram       CRI (chronic renal insufficiency), stage 3 (moderate)   Hydrate well Off HCTZ      Relevant Orders   CK   CBC with Differential/Platelet   Comprehensive metabolic panel with GFR   Hemoglobin A1c   T4, free   TSH   DM II (diabetes mellitus, type II), controlled (HCC) - Primary   On Metformin Check A1c      Relevant Orders   CK   CBC with Differential/Platelet   Comprehensive metabolic panel with GFR   Hemoglobin A1c   T4, free   TSH   Essential hypertension   On Losartan , off HCTZ      Relevant Orders   CK   CBC with Differential/Platelet   Comprehensive metabolic panel with GFR   Hemoglobin A1c   T4, free   TSH   Polyneuropathy due to type 2 diabetes mellitus (HCC)   Monitor A1c      S/P TAVR (transcatheter aortic valve replacement)   DOE is much better         Meds ordered this encounter  Medications   finasteride  (PROSCAR ) 5 MG tablet    Sig: Take 1 tablet (5 mg total) by mouth daily.    Dispense:  90 tablet    Refill:  3      Follow-up: Return in about 2 months (around 04/10/2024) for a follow-up visit.  Marolyn Noel, MD

## 2024-02-08 NOTE — Assessment & Plan Note (Signed)
On Losartan, off HCTZ

## 2024-02-08 NOTE — Assessment & Plan Note (Signed)
On Metformin.  Check A1c 

## 2024-02-08 NOTE — Assessment & Plan Note (Signed)
 Worse. He does not want surgery Pt saw a urologist On Flomax  Added Proscar 

## 2024-02-10 ENCOUNTER — Encounter (HOSPITAL_COMMUNITY)
Admission: RE | Admit: 2024-02-10 | Discharge: 2024-02-10 | Disposition: A | Discharge status: Home / Self Care | Source: Ambulatory Visit | Attending: Cardiology | Admitting: Cardiology

## 2024-02-10 DIAGNOSIS — Z952 Presence of prosthetic heart valve: Secondary | ICD-10-CM

## 2024-02-10 NOTE — Progress Notes (Signed)
 Daily Session Note  Patient Details  Name: TIMMOTHY BARANOWSKI Sr. MRN: [id number] Date of Birth: 05/28/1940 Referring Provider:   Flowsheet Row CARDIAC REHAB PHASE II ORIENTATION from 12/07/2023 in [facility] CARDIAC REHABILITATION  Referring Provider Shyrl Salix MD  Bevin Pole MD]    Encounter Date: 02/10/2024  Check In:  Session Check In - 02/10/24 1015       Check-In   Supervising physician immediately available to respond to emergencies See telemetry face sheet for immediately available MD    Location [facility]    Staff Present Rolland Sake BSN, RN;Saturnino Liew Vicci, RN, BSN;Heather Con, BS, Exercise Physiologist    Virtual Visit No    Medication changes reported     No    Fall or balance concerns reported    No    Warm-up and Cool-down Performed on first and last piece of equipment    Resistance Training Performed Yes    PAD/SET Patient? No      Pain Assessment   Currently in Pain? No/denies    Multiple Pain Sites No          Capillary Blood Glucose: No results found for this or any previous visit (from the past 24 hours).    Tobacco Use History[1]  Goals Met:  Independence with exercise equipment Exercise tolerated well No report of concerns or symptoms today Strength training completed today  Goals Unmet:  Not Applicable  Comments: Pt able to follow exercise prescription today without complaint.  Will continue to monitor for progression.        [1]  Social History Tobacco Use  Smoking Status Never  Smokeless Tobacco Former   Types: Chew   Quit date: 03/03/1975  Tobacco Comments   didn't chew much when I did; just a little bit

## 2024-02-11 ENCOUNTER — Ambulatory Visit: Payer: Self-pay | Admitting: Internal Medicine

## 2024-02-15 ENCOUNTER — Telehealth (HOSPITAL_COMMUNITY): Payer: Self-pay

## 2024-02-15 ENCOUNTER — Telehealth: Payer: Self-pay

## 2024-02-15 ENCOUNTER — Encounter (HOSPITAL_COMMUNITY)

## 2024-02-15 NOTE — Telephone Encounter (Signed)
 Copied from CRM #[id number]. Topic: Clinical - Medication Question >> Feb 15, 2024  8:23 AM Deleta RAMAN wrote: Reason for CRM: patient wife would like pcp to prescribed medication for sinus due to cough and minor blood coming out nose. Please follow up at [phone] leave a detail message if no answer

## 2024-02-15 NOTE — Telephone Encounter (Signed)
 Called about missed appointment. Pt was not feeling well and has went to PCP to get medication for sinus. He will try to come on Thursday

## 2024-02-16 MED ORDER — AMOXICILLIN 875 MG PO TABS: 875.0000 mg | ORAL_TABLET | Freq: Two times a day (BID) | ORAL | 0 refills | Status: AC

## 2024-02-16 NOTE — Telephone Encounter (Signed)
 Okay amoxicillin .  Thanks

## 2024-02-16 NOTE — Addendum Note (Signed)
 Addended by: Nikea Settle V on: 02/16/2024 11:30 PM   Modules accepted: Orders

## 2024-02-17 ENCOUNTER — Encounter (HOSPITAL_COMMUNITY)

## 2024-02-22 ENCOUNTER — Encounter (HOSPITAL_COMMUNITY)
Admission: RE | Admit: 2024-02-22 | Discharge: 2024-02-22 | Disposition: A | Discharge status: Home / Self Care | Source: Ambulatory Visit | Attending: Cardiology | Admitting: Cardiology

## 2024-02-22 DIAGNOSIS — Z952 Presence of prosthetic heart valve: Secondary | ICD-10-CM | POA: Diagnosis not present

## 2024-02-22 NOTE — Progress Notes (Signed)
 Daily Session Note  Patient Details  Name: AKSHITH MONCUS Sr. MRN: [id number] Date of Birth: Dec 08, 1940 Referring Provider:   Flowsheet Row CARDIAC REHAB PHASE II ORIENTATION from 12/07/2023 in [facility] CARDIAC REHABILITATION  Referring Provider Shyrl [place] MD  Bevin Pole MD]    Encounter Date: 02/22/2024  Check In:  Session Check In - 02/22/24 1015       Check-In   Supervising physician immediately available to respond to emergencies See telemetry face sheet for immediately available MD    Location [facility]    Staff Present Adrien Louder, RN, BSN;Heather Con, BS, Exercise Physiologist;Jessica [place], MA, RCEP, CCRP, CCET    Virtual Visit No    Medication changes reported     No    Fall or balance concerns reported    No    Warm-up and Cool-down Performed on first and last piece of equipment    Resistance Training Performed Yes    VAD Patient? No    PAD/SET Patient? No      Pain Assessment   Currently in Pain? No/denies    Multiple Pain Sites No          Capillary Blood Glucose: No results found for this or any previous visit (from the past 24 hours).    Tobacco Use History[1]  Goals Met:  Independence with exercise equipment Exercise tolerated well No report of concerns or symptoms today Strength training completed today  Goals Unmet:  Not Applicable  Comments: Pt able to follow exercise prescription today without complaint.  Will continue to monitor for progression.        [1]  Social History Tobacco Use  Smoking Status Never  Smokeless Tobacco Former   Types: Chew   Quit date: 03/03/1975  Tobacco Comments   didn't chew much when I did; just a little bit
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
## 2024-02-29 ENCOUNTER — Encounter (HOSPITAL_COMMUNITY)
Admission: RE | Admit: 2024-02-29 | Discharge: 2024-02-29 | Disposition: A | Discharge status: Home / Self Care | Source: Ambulatory Visit | Attending: Cardiology | Admitting: Cardiology

## 2024-02-29 ENCOUNTER — Encounter (HOSPITAL_COMMUNITY): Payer: Self-pay | Admitting: *Deleted

## 2024-02-29 DIAGNOSIS — Z952 Presence of prosthetic heart valve: Secondary | ICD-10-CM

## 2024-02-29 NOTE — Progress Notes (Signed)
 Daily Session Note  Patient Details  Name: Andrew SANDLER Sr. MRN: [id number] Date of Birth: 07/18/1940 Referring Provider:   Flowsheet Row CARDIAC REHAB PHASE II ORIENTATION from 12/07/2023 in [facility] CARDIAC REHABILITATION  Referring Provider Shyrl Talihina MD  Bevin Pole MD]    Encounter Date: 02/29/2024  Check In:   Capillary Blood Glucose: No results found for this or any previous visit (from the past 24 hours).    Tobacco Use History[1]  Goals Met:  Independence with exercise equipment Exercise tolerated well No report of concerns or symptoms today  Goals Unmet:  Not Applicable  Comments:  Pt able to follow exercise prescription today without complaint.  Will continue to monitor for progression.            [1]  Social History Tobacco Use  Smoking Status Never  Smokeless Tobacco Former   Types: Chew   Quit date: 03/03/1975  Tobacco Comments   didn't chew much when I did; just a little bit

## 2024-02-29 NOTE — Progress Notes (Signed)
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 high blood pressure.   Exercise and Your Heart -Discuss why it is important to exercise, the FITT principles of exercise, normal and abnormal responses to exercise, and how to exercise safely.   Angina -Discuss definition of angina, causes of angina, treatment of angina, and how to decrease risk of having angina.   Cardiac Medications -Review what the following cardiac medications are used for, how they affect the body, and side effects that may occur when taking the medications.  Medications include Aspirin , Beta blockers, calcium  channel blockers, ACE Inhibitors, angiotensin receptor blockers, diuretics, digoxin, and antihyperlipidemics.   Congestive Heart Failure -Discuss the definition of CHF, how to live with CHF, the signs and symptoms of CHF, and how keep track of weight and sodium intake.   Heart Disease and Intimacy -Discus the effect sexual  activity has on the heart, how changes occur during intimacy as we age, and safety during sexual activity.   Smoking Cessation / COPD -Discuss different methods to quit smoking, the health benefits of quitting smoking, and the definition of COPD.   Nutrition I: Fats -Discuss the types of cholesterol, what cholesterol does to the heart, and how cholesterol levels can be controlled.   Nutrition II: Labels -Discuss the different components of food labels and how to read  food label   Heart Parts/Heart Disease and PAD -Discuss the anatomy of the heart, the pathway of blood circulation through the heart, and these are affected by heart disease.   Stress I: Signs and Symptoms -Discuss the causes of stress, how stress may lead to anxiety and depression, and ways to limit stress.   Stress II: Relaxation -Discuss different types of relaxation techniques to limit stress.   Warning Signs of Stroke / TIA -Discuss definition of a stroke, what the signs and symptoms are of a stroke, and how to identify when someone is having stroke.   Knowledge Questionnaire Score:  Knowledge Questionnaire Score - 12/07/23 1323       Knowledge Questionnaire Score   Pre Score 25/26          Core Components/Risk Factors/Patient Goals at Admission:  Personal Goals and Risk Factors at Admission - 11/02/23 1324       Core Components/Risk Factors/Patient Goals on Admission   Diabetes Yes    Intervention Provide education about signs/symptoms and action to take for hypo/hyperglycemia.;Provide education about proper nutrition, including hydration, and aerobic/resistive exercise prescription along with prescribed medications to achieve blood glucose in normal ranges: Fasting glucose 65-99 mg/dL    Expected Outcomes Short Term: Participant verbalizes understanding of the signs/symptoms and immediate care of hyper/hypoglycemia, proper foot care and importance of medication, aerobic/resistive exercise and  nutrition plan for blood glucose control.;Long Term: Attainment of HbA1C < 7%.    Heart Failure Yes    Intervention Provide a combined exercise and nutrition program that is supplemented with education, support and counseling about heart failure. Directed toward relieving symptoms such as shortness of breath, decreased exercise tolerance, and extremity edema.    Expected Outcomes Improve functional capacity of life;Short term: Attendance in program 2-3 days a week with increased exercise capacity. Reported lower sodium intake. Reported increased fruit and vegetable intake. Reports medication compliance.;Short term: Daily weights obtained and reported for increase. Utilizing diuretic protocols set by physician.;Long term: Adoption of self-care skills and reduction of barriers for early signs and symptoms recognition and intervention leading to self-care maintenance.    Hypertension Yes    Intervention Monitor prescription use compliance.;Provide education on lifestyle modifcations including regular physical activity/exercise, weight management, moderate sodium restriction and increased consumption of fresh fruit, vegetables, and low fat dairy, alcohol moderation, and smoking cessation.    Expected Outcomes Short Term: Continued assessment and intervention until BP is < 140/87mm HG in hypertensive participants. < 130/46mm HG in hypertensive participants with diabetes, heart failure or chronic kidney disease.;Long Term: Maintenance of blood pressure at goal levels.    Lipids Yes    Intervention Provide education and support for participant on nutrition & aerobic/resistive exercise along with prescribed medications to achieve LDL 70mg , HDL >40mg .    Expected Outcomes Long Term: Cholesterol controlled with medications as prescribed, with individualized exercise RX and with personalized nutrition plan. Value goals: LDL < 70mg , HDL > 40 mg.;Short Term: Participant states understanding of desired cholesterol  values and is compliant with medications prescribed. Participant is following exercise prescription and nutrition guidelines.          Core Components/Risk Factors/Patient Goals Review:   Goals and Risk Factor Review     Row Name 01/04/24 1051 02/22/24 1113           Core Components/Risk Factors/Patient Goals Review   Personal Goals Review Hypertension;Lipids;Diabetes Hypertension;Diabetes;Lipids      Review Andrew Malone is doing well in rehab. He checks his BP at home  and it runs well except only one reading has been elevated. He takes all of his medicines as he should. He takes his blood sugar on the regular as well. Andrew Malone is doing great in rehab. He checks his blood pressure at home and it runs well for the most part. He takes all of his medicines as he should. He takes his blood sugar on the regular as well.      Expected Outcomes Short: Continue to attend rehab. Long: Continue taking vitals at home and report any abnormalities to his doctor. Short: Continue to attend rehab. Long: Continue taking vitals at home and report any abnormalities to his doctor.         Core Components/Risk Factors/Patient Goals at Discharge (Final Review):   Goals and Risk Factor Review - 02/22/24 1113       Core Components/Risk Factors/Patient Goals Review   Personal Goals Review Hypertension;Diabetes;Lipids    Review Andrew Malone is doing great in rehab. He checks his blood pressure at home and it runs well for the most part. He takes all of his medicines as he should. He takes his blood sugar on the regular as well.    Expected Outcomes Short: Continue to attend rehab. Long: Continue taking vitals at home and report any abnormalities to his doctor.          ITP Comments:  ITP Comments     Row Name 11/02/23 1314 12/07/23 1331 12/08/23 1526 12/21/23 1016 01/05/24 0853   ITP Comments Completed virtual orientation today.  EP evaluation is scheduled for 11/04/23 at 1330 .  Documentation for diagnosis can be  found in [facility] encounter 10/12/23. Patient attend orientation today.  Patient is attending Cardiac Rehabilitation Program.  Documentation for diagnosis can be found in [facility].  Reviewed medical chart, RPE/RPD, gym safety, and program guidelines.  Patient was fitted to equipment they will be using during rehab.  Patient is scheduled to start exercise on 12/14/23.   Initial ITP created and sent for review and signature by Dr. Dorn Ross, Medical Director for Cardiac Rehabilitation Program. 30 day review completed. ITP sent to Dr. Dorn Ross, Medical Director of Cardiac Rehab. Continue with ITP unless changes are made by physician.  Pt has only completed orientation thus far. First full day of exercise!  Patient was oriented to gym and equipment including functions, settings, policies, and procedures.  Patient's individual exercise prescription and treatment plan were reviewed.  All starting workloads were established based on the results of the 6 minute walk test done at initial orientation visit.  The plan for exercise progression was also introduced and progression will be customized based on patient's performance and goals. 30 day review completed. ITP sent to Dr. Dorn Ross, Medical Director of Cardiac Rehab. Continue with ITP unless changes are made by physician.  New to the program.    Row Name 02/01/24 1048 02/29/24 1434         ITP Comments 30 day review completed. ITP sent to Dr. Dorn Ross, Medical Director of Cardiac Rehab. Continue with ITP unless changes are made by physician. Unable to assess for goals, out since 01/18/24, returned today. 30 day review completed. ITP sent to Dr. Dorn Ross, Medical Director of Cardiac Rehab. Continue with ITP unless changes are made by physician.         Comments: 30 day review     [1]  Current Outpatient Medications:    acyclovir  (ZOVIRAX ) 200 MG capsule, Take 1 capsule (200 mg total) by mouth  2 (two) times daily., Disp: 180 capsule,  Rfl: 3   amLODipine  (NORVASC ) 5 MG tablet, Take 1 tablet (5 mg total) by mouth daily., Disp: 90 tablet, Rfl: 3   ammonium lactate (LAC-HYDRIN) 12 % lotion, APPLY TO THE TO THE AFFECTED AREA DAILY, Disp: 400 g, Rfl: 0   aspirin  EC 81 MG tablet, Take 1 tablet (81 mg total) by mouth daily. Swallow whole., Disp: 30 tablet, Rfl: 12   Cholecalciferol  1000 units tablet, Take 1,000 Units by mouth daily., Disp: , Rfl:    citalopram  (CELEXA ) 10 MG tablet, TAKE 1 TABLET(10 MG) BY MOUTH DAILY, Disp: 90 tablet, Rfl: 3   clopidogrel  (PLAVIX ) 75 MG tablet, TAKE 1 TABLET(75 MG) BY MOUTH DAILY, Disp: 90 tablet, Rfl: 3   Cyanocobalamin  (VITAMIN B-12) 1000 MCG SUBL, Place 1 tablet (1,000 mcg total) under the tongue daily., Disp: 100 tablet, Rfl: 3   ezetimibe  (ZETIA ) 10 MG tablet, TAKE 1 TABLET(10 MG) BY MOUTH DAILY, Disp: 90 tablet, Rfl: 2   finasteride  (PROSCAR ) 5 MG tablet, Take 1 tablet (5 mg total) by mouth daily., Disp: 90 tablet, Rfl: 3   glucose blood (ONETOUCH VERIO) test strip, check blood sugar In Vitro once a day DX: E11.42 for 90 days, Disp: , Rfl:    loperamide (IMODIUM) 2 MG capsule, Take 2 mg by mouth daily as needed (Diarrhea)., Disp: , Rfl:    metFORMIN (GLUCOPHAGE-XR) 500 MG 24 hr tablet, Take 1,000 mg by mouth daily with supper. , Disp: , Rfl: 3   metoprolol  succinate (TOPROL -XL) 25 MG 24 hr tablet, Take 1 tablet (25 mg total) by mouth at bedtime., Disp: 90 tablet, Rfl: 1   nitroGLYCERIN  (NITROSTAT ) 0.4 MG SL tablet, Place 1 tablet (0.4 mg total) under the tongue every 5 (five) minutes x 3 doses as needed for chest pain., Disp: 25 tablet, Rfl: 12   pantoprazole  (PROTONIX ) 40 MG tablet, TAKE 1 TABLET BY MOUTH EVERY DAY, Disp: 90 tablet, Rfl: 3   tamsulosin  (FLOMAX ) 0.4 MG CAPS capsule, TAKE 1 CAPSULE(0.4 MG) BY MOUTH AT BEDTIME, Disp: 90 capsule, Rfl: 3   ZYPITAMAG  4 MG TABS, TAKE 1 TABLET BY MOUTH EVERY DAY, Disp: 60 tablet, Rfl: 5 [2]  Social History Tobacco Use  Smoking Status Never   Smokeless Tobacco Former   Types: Chew   Quit date: 03/03/1975  Tobacco Comments   didn't chew much when I did; just a little bit

## 2024-03-02 ENCOUNTER — Encounter (HOSPITAL_COMMUNITY)

## 2024-03-03 ENCOUNTER — Ambulatory Visit: Admitting: Cardiology

## 2024-03-07 ENCOUNTER — Encounter (HOSPITAL_COMMUNITY): Admission: RE | Admit: 2024-03-07 | Source: Ambulatory Visit

## 2024-03-07 ENCOUNTER — Telehealth (HOSPITAL_COMMUNITY): Payer: Self-pay

## 2024-03-07 NOTE — Telephone Encounter (Signed)
 Called patient about missing class today, his brother in law had recently passed away and he is helping with funeral arrangements that is tomorrow.

## 2024-03-09 ENCOUNTER — Encounter (HOSPITAL_COMMUNITY)
Admission: RE | Admit: 2024-03-09 | Discharge: 2024-03-09 | Disposition: A | Discharge status: Home / Self Care | Source: Ambulatory Visit | Attending: Cardiology | Admitting: Cardiology

## 2024-03-09 ENCOUNTER — Ambulatory Visit

## 2024-03-09 DIAGNOSIS — Z952 Presence of prosthetic heart valve: Secondary | ICD-10-CM | POA: Insufficient documentation

## 2024-03-09 NOTE — Progress Notes (Signed)
 Daily Session Note  Patient Details  Name: Andrew BOLGER Sr. MRN: [id number] Date of Birth: 01/01/1941 Referring Provider:   Flowsheet Row CARDIAC REHAB PHASE II ORIENTATION from 12/07/2023 in [facility] CARDIAC REHABILITATION  Referring Provider Shyrl Newry MD  Bevin Pole MD]    Encounter Date: 03/09/2024  Check In:  Session Check In - 03/09/24 1048       Check-In   Supervising physician immediately available to respond to emergencies See telemetry face sheet for immediately available MD    Location [facility]    Staff Present Powell Benders, BS, Exercise Physiologist;Hillary Dean BSN, RN    Virtual Visit No    Medication changes reported     No    Fall or balance concerns reported    No    Warm-up and Cool-down Performed on first and last piece of equipment    Resistance Training Performed Yes    VAD Patient? No    PAD/SET Patient? No      Pain Assessment   Currently in Pain? No/denies          Capillary Blood Glucose: No results found for this or any previous visit (from the past 24 hours).    Tobacco Use History[1]  Goals Met:  Independence with exercise equipment Exercise tolerated well No report of concerns or symptoms today Strength training completed today  Goals Unmet:  Not Applicable  Comments: Pt able to follow exercise prescription today without complaint.  Will continue to monitor for progression.        [1]  Social History Tobacco Use  Smoking Status Never  Smokeless Tobacco Former   Types: Chew   Quit date: 03/03/1975  Tobacco Comments   didn't chew much when I did; just a little bit

## 2024-03-14 ENCOUNTER — Encounter (HOSPITAL_COMMUNITY)
Admission: RE | Admit: 2024-03-14 | Discharge: 2024-03-14 | Disposition: A | Source: Ambulatory Visit | Attending: Cardiology

## 2024-03-14 DIAGNOSIS — Z952 Presence of prosthetic heart valve: Secondary | ICD-10-CM

## 2024-03-14 NOTE — Progress Notes (Signed)
 Daily Session Note  Patient Details  Name: REG BIRCHER Sr. MRN: [id number] Date of Birth: May 15, 1940 Referring Provider:   Flowsheet Row CARDIAC REHAB PHASE II ORIENTATION from 12/07/2023 in [facility] CARDIAC REHABILITATION  Referring Provider Shyrl [place] MD  Bevin Pole MD]    Encounter Date: 03/14/2024  Check In:  Session Check In - 03/14/24 1042       Check-In   Supervising physician immediately available to respond to emergencies See telemetry face sheet for immediately available MD    Location [facility]    Staff Present Powell Benders, BS, Exercise Physiologist;Victoria Zina, RN;Jessica [place], MA, RCEP, CCRP, CCET    Virtual Visit No    Medication changes reported     No    Fall or balance concerns reported    No    Tobacco Cessation No Change    Warm-up and Cool-down Performed on first and last piece of equipment    Resistance Training Performed Yes    VAD Patient? No    PAD/SET Patient? No      Pain Assessment   Currently in Pain? No/denies    Multiple Pain Sites No          Capillary Blood Glucose: No results found for this or any previous visit (from the past 24 hours).    Tobacco Use History[1]  Goals Met:  Independence with exercise equipment Exercise tolerated well No report of concerns or symptoms today Strength training completed today  Goals Unmet:  Not Applicable  Comments: Pt able to follow exercise prescription today without complaint.  Will continue to monitor for progression.        [1]  Social History Tobacco Use  Smoking Status Never  Smokeless Tobacco Former   Types: Chew   Quit date: 03/03/1975  Tobacco Comments   didn't chew much when I did; just a little bit
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
## 2024-03-16 ENCOUNTER — Encounter (HOSPITAL_COMMUNITY)

## 2024-03-21 ENCOUNTER — Encounter (HOSPITAL_COMMUNITY)

## 2024-03-23 ENCOUNTER — Encounter (HOSPITAL_COMMUNITY)
Admission: RE | Admit: 2024-03-23 | Discharge: 2024-03-23 | Disposition: A | Discharge status: Home / Self Care | Source: Ambulatory Visit | Attending: Cardiology | Admitting: Cardiology

## 2024-03-23 DIAGNOSIS — Z952 Presence of prosthetic heart valve: Secondary | ICD-10-CM

## 2024-03-23 NOTE — Progress Notes (Signed)
 Daily Session Note  Patient Details  Name: Andrew COPPOLINO Sr. MRN: [id number] Date of Birth: 1940-03-08 Referring Provider:   Flowsheet Row CARDIAC REHAB PHASE II ORIENTATION from 12/07/2023 in [facility] C[facility]AC [facility]ITATION  Referring Provider Shyrl Westley MD  Bevin Pole MD]    Encounter Date: 03/23/2024  Check In:  Session Check In - 03/23/24 1015       Check-In   Supervising physician immediately available to respond to emergencies See telemetry face sheet for immediately available MD    Location [facility]    Staff Present Rolland Sake BSN, RN;Christiann Hagerty Vicci, RN, BSN;Heather Con, BS, Exercise Physiologist    Virtual Visit No    Medication changes reported     No    Fall or balance concerns reported    No    Warm-up and Cool-down Performed on first and last piece of equipment    Resistance Training Performed Yes    VAD Patient? No    PAD/SET Patient? No      Pain Assessment   Currently in Pain? No/denies    Multiple Pain Sites No          Capillary Blood Glucose: No results found for this or any previous visit (from the past 24 hours).    Tobacco Use History[1]  Goals Met:  Independence with exercise equipment Exercise tolerated well No report of concerns or symptoms today Strength training completed today  Goals Unmet:  Not Applicable  Comments: Pt able to follow exercise prescription today without complaint.  Will continue to monitor for progression.        [1]  Social History Tobacco Use  Smoking Status Never  Smokeless Tobacco Former   Types: Chew   Quit date: 03/03/1975  Tobacco Comments   didn't chew much when I did; just a little bit

## 2024-03-23 NOTE — Progress Notes (Signed)
 I have reviewed a Home Exercise Prescription with Reason E Divis Sr. SABRA Agent is  currently exercising at home by walking, resistance bands and stretching .  The patient was advised to exercise 3-5 days a week for 30-45 minutes.  Agent and I discussed how to progress their exercise prescription.  The patient stated that their goals were to improve endurance and maintain strength .  The patient stated that they understand the exercise prescription.  We reviewed exercise guidelines, target heart rate during exercise, RPE Scale, weather conditions, NTG use, endpoints for exercise, warmup and cool down.  Patient is encouraged to come to me with any questions. I will continue to follow up with the patient to assist them with progression and safety.

## 2024-03-27 ENCOUNTER — Telehealth: Payer: Self-pay | Admitting: Cardiology

## 2024-03-27 DIAGNOSIS — E7800 Pure hypercholesterolemia, unspecified: Secondary | ICD-10-CM

## 2024-03-27 NOTE — Telephone Encounter (Signed)
" °*  STAT* If patient is at the pharmacy, call can be transferred to refill team.   1. Which medications need to be refilled? (please list name of each medication and dose if known)   ezetimibe  (ZETIA ) 10 MG tablet     2. Would you like to learn more about the convenience, safety, & potential cost savings by using the [facility]? No      3. Are you open to using the [facility] (Type [facility]. ). No    4. Which pharmacy/location (including street and city if local pharmacy) is medication to be sent to?  [id number] - [place], [place] - [address] AT [address]     5. Do they need a 30 day or 90 day supply? 90 days    Had to reschedule appt due to weather. Need refill, patient is out of meds  "

## 2024-03-28 ENCOUNTER — Encounter (HOSPITAL_COMMUNITY): Payer: Self-pay

## 2024-03-28 ENCOUNTER — Encounter (HOSPITAL_COMMUNITY)

## 2024-03-28 ENCOUNTER — Ambulatory Visit: Admitting: Cardiology

## 2024-03-28 DIAGNOSIS — Z952 Presence of prosthetic heart valve: Secondary | ICD-10-CM

## 2024-03-28 MED ORDER — EZETIMIBE 10 MG PO TABS: 10.0000 mg | ORAL_TABLET | Freq: Every day | ORAL | 4 refills | Status: AC

## 2024-03-28 NOTE — Progress Notes (Signed)
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 symptoms, and general health in studies assessing change in quality of life.  PHQ-9: Review Flowsheet  More data exists      12/07/2023 04/08/2023 01/06/2023 12/30/2022 10/06/2022  Depression screen PHQ 2/9  Decreased Interest 0 0 0 0 0  Down, Depressed, Hopeless 0 0 0 0 0  PHQ - 2 Score 0 0 0 0 0  Altered sleeping 1 0 0 0 0  Tired, decreased energy 0 0 0 0 0  Change in appetite 0 0 0 0 0  Feeling bad or failure about yourself  0 0 0 0 0  Trouble concentrating 0 0 0 0 0  Moving slowly or fidgety/restless 0 0 0 0 0  Suicidal thoughts 0 0 0 0 0  PHQ-9 Score 1  0  0  0  0   Difficult doing work/chores Not difficult at all Not difficult at all Not difficult at all Not difficult at all Not difficult at all    Details       Data saved with a previous flowsheet row definition        Interpretation of Total Score  Total Score  Depression Severity:  1-4 = Minimal depression, 5-9 = Mild depression, 10-14 = Moderate depression, 15-19 = Moderately severe depression, 20-27 = Severe depression   Psychosocial Evaluation and Intervention:  Psychosocial Evaluation - 11/02/23 1325       Psychosocial Evaluation & Interventions   Interventions Encouraged to exercise with the program and follow exercise prescription    Comments During the phone call I spoke with [name] Agent wife as he was lying down. [name] was a very pleasant lady who expresses Jamarkis is excited to come to the program. 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 He is independent with all ADL's, and has been in good shape up until this recent surgery. [name] expresses that since the surgery he has been kind of weak and taking it easy. He used to exercise some at home with his wife until the surgery. He had some neck and shoulder pain after the surgery that he had to go to the ER for, but has a follow up appt with Ortho tomorrow to make sure nothing is wrong from their standpoint. 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 His wife [name] is his good support system.    Expected Outcomes Short: Build strength back up. Long: Increase strength and stamina.    Continue Psychosocial Services  Follow up required by staff          Psychosocial Re-Evaluation:  Psychosocial Re-Evaluation     Row Name 01/04/24 1050 02/22/24 1106 03/23/24 1040         Psychosocial Re-Evaluation   Current issues with None Identified Current Sleep Concerns Current Sleep  Concerns     Comments Andrew Malone currently identifies no stressors in his life. He is also sleeping well. Andrew Malone currently identifies no stressors in his life. He has some trouble sleeping, due to frequent trips to the bathroom. Andrew Malone has no stressors in his life but he does have sleep issues. He has had a CPAP for over 15 years and stated that is really hasnt helped much. He  gets up about 2-3 times a night to use the restroom as well.     Expected Outcomes Short; Continue to attend rehab. Long: Continue to have stress free lifestyle. Short; Speak with MD about frequent night trips to the bathroom and if there could be some medication changes. Long: Continue to have stress free lifestyle. Short; Speak with MD about frequent night trips to the bathroom and if there could be some medication changes. Long: Continue to have stress free lifestyle.     Interventions Encouraged to attend Cardiac Rehabilitation for the exercise Encouraged to attend Cardiac Rehabilitation for the exercise Encouraged to attend Cardiac Rehabilitation for the exercise     Continue Psychosocial Services  Follow up required by staff Follow up required by staff Follow up required by staff        Psychosocial Discharge (Final Psychosocial Re-Evaluation):  Psychosocial Re-Evaluation - 03/23/24 1040       Psychosocial Re-Evaluation   Current issues with Current Sleep Concerns    Comments Andrew Malone has no stressors in his life but he does have sleep issues. He has had a CPAP for over 15 years and stated that is really hasnt helped much. He gets up about 2-3 times a night to use the restroom as well.    Expected Outcomes Short; Speak with MD about frequent night trips to the bathroom and if there could be some medication changes. Long: Continue to have stress free lifestyle.    Interventions Encouraged to attend Cardiac Rehabilitation for the exercise    Continue Psychosocial Services  Follow up required by staff          Vocational Rehabilitation: Provide vocational rehab assistance to qualifying candidates.   Vocational Rehab Evaluation & Intervention:  Vocational Rehab - 11/02/23 1324       Initial Vocational Rehab Evaluation & Intervention   Assessment shows need for Vocational Rehabilitation No          Education: Education Goals: Education classes will be provided on a variety of  topics geared toward better understanding of heart health and risk factor modification. Participant will state understanding/return demonstration of topics presented as noted by education test scores.  Learning Barriers/Preferences:  Learning Barriers/Preferences - 11/02/23 1325       Learning Barriers/Preferences   Learning Barriers None    Learning Preferences None          General Cardiac Education Topics:  AED/CPR: - Group verbal and written instruction with the use of models to demonstrate the basic use of the AED with the basic ABC's of resuscitation.   Test and Procedures: - Group verbal and visual presentation and models provide information about basic cardiac anatomy and function. Reviews the testing methods done to diagnose heart disease and the outcomes of the test results. Describes the treatment choices: Medical Management, Angioplasty, or Coronary Bypass Surgery for treating various heart conditions including Myocardial Infarction, Angina, Valve Disease, and Cardiac Arrhythmias. Written material provided at class time. Flowsheet Row CARDIAC REHAB PHASE II EXERCISE from 03/23/2024 in [facility]  Date 02/10/24  Educator HB  Instruction Review Code 1- Verbalizes Understanding    Medication Safety: - Group verbal and visual instruction to review commonly prescribed medications for heart and lung disease. Reviews the medication, class of the drug, and side effects. Includes the steps to properly store meds and maintain the prescription regimen. Written material provided at class time. Flowsheet Row CARDIAC REHAB PHASE II EXERCISE from 03/23/2024 in [place] [facility]  Date 02/03/24  Educator [facility]  Instruction Review Code 1- Verbalizes Understanding    Intimacy: - Group verbal instruction through game format to discuss how heart and lung disease can affect sexual intimacy. Written material provided at class time.   Know Your Numbers  and Heart Failure: - Group verbal and visual instruction to discuss disease risk factors for cardiac and pulmonary disease and treatment options.  Reviews associated critical values for Overweight/Obesity, Hypertension, Cholesterol, and Diabetes.  Discusses basics of heart failure: signs/symptoms and treatments.  Introduces Heart Failure Zone chart for action plan for heart failure. Written material provided at class time.   Infection Prevention: - Provides verbal and written material to individual with discussion of infection control including proper hand washing and proper equipment cleaning during exercise session.   Falls Prevention: - Provides verbal and written material to individual with discussion of falls prevention and safety.   Other: -Provides group and verbal instruction on various topics (see comments)   Knowledge Questionnaire Score:  Knowledge Questionnaire Score - 12/07/23 1323       Knowledge Questionnaire Score   Pre Score 25/26          Core Components/Risk Factors/Patient Goals at Admission:  Personal Goals and Risk Factors at Admission - 11/02/23 1324       Core Components/Risk Factors/Patient Goals on Admission   Diabetes Yes    Intervention Provide education about signs/symptoms and action to take for hypo/hyperglycemia.;Provide education about proper nutrition, including hydration, and aerobic/resistive exercise prescription along with prescribed medications to achieve blood glucose in normal ranges: Fasting glucose 65-99 mg/dL    Expected Outcomes Short Term: Participant verbalizes understanding of the signs/symptoms and immediate care of hyper/hypoglycemia, proper foot care and importance of medication, aerobic/resistive exercise and nutrition plan for blood glucose control.;Long Term: Attainment of HbA1C < 7%.    Heart Failure Yes    Intervention Provide a combined exercise and nutrition program that is supplemented with education, support and  counseling about heart failure. Directed toward relieving symptoms such as shortness of breath, decreased exercise tolerance, and extremity edema.    Expected Outcomes Improve functional capacity of life;Short term: Attendance in program 2-3 days a week with increased exercise capacity. Reported lower sodium intake. Reported increased fruit and vegetable intake. Reports medication compliance.;Short term: Daily weights obtained and reported for increase. Utilizing diuretic protocols set by physician.;Long term: Adoption of self-care skills and reduction of barriers for early signs and symptoms recognition and intervention leading to self-care maintenance.    Hypertension Yes    Intervention Monitor prescription use compliance.;Provide education on lifestyle modifcations including regular physical activity/exercise, weight management, moderate sodium restriction and increased consumption of fresh fruit, vegetables, and low fat dairy, alcohol moderation, and smoking cessation.    Expected Outcomes Short Term: Continued assessment and intervention until BP is < 140/62mm HG in hypertensive participants. < 130/12mm HG in hypertensive participants with diabetes, heart failure or chronic kidney disease.;Long Term: Maintenance of blood pressure at goal levels.    Lipids Yes    Intervention Provide education and support for participant on nutrition & aerobic/resistive  exercise along with prescribed medications to achieve LDL 70mg , HDL >40mg .    Expected Outcomes Long Term: Cholesterol controlled with medications as prescribed, with individualized exercise RX and with personalized nutrition plan. Value goals: LDL < 70mg , HDL > 40 mg.;Short Term: Participant states understanding of desired cholesterol values and is compliant with medications prescribed. Participant is following exercise prescription and nutrition guidelines.          Education:Diabetes - Individual verbal and written instruction to review  signs/symptoms of diabetes, desired ranges of glucose level fasting, after meals and with exercise. Acknowledge that pre and post exercise glucose checks will be done for 3 sessions at entry of program.   Core Components/Risk Factors/Patient Goals Review:   Goals and Risk Factor Review     Row Name 01/04/24 1051 02/22/24 1113 03/23/24 1044         Core Components/Risk Factors/Patient Goals Review   Personal Goals Review Hypertension;Lipids;Diabetes Hypertension;Diabetes;Lipids Hypertension;Diabetes;Lipids     Review Andrew Malone is doing well in rehab. He checks his BP at home and it runs well except only one reading has been elevated. He takes all of his medicines as he should. He takes his blood sugar on the regular as well. Andrew Malone is doing great in rehab. He checks his blood pressure at home and it runs well for the most part. He takes all of his medicines as he should. He takes his blood sugar on the regular as well. Andrew Malone is doing great in rehab. He is exercsing at home and strecthing as well. He is eating a healthy diet and his weight has been maintaining. He checkes his BP at home and as had good readings for the most part. He also takes his blood sugar regular as welll     Expected Outcomes Short: Continue to attend rehab. Long: Continue taking vitals at home and report any abnormalities to his doctor. Short: Continue to attend rehab. Long: Continue taking vitals at home and report any abnormalities to his doctor. Short: Continue to attend rehab. Long: Continue taking vitals at home and report any abnormalities to his doctor.        Core Components/Risk Factors/Patient Goals at Discharge (Final Review):   Goals and Risk Factor Review - 03/23/24 1044       Core Components/Risk Factors/Patient Goals Review   Personal Goals Review Hypertension;Diabetes;Lipids    Review Andrew Malone is doing great in rehab. He is exercsing at home and strecthing as well. He is eating a healthy diet and his weight has  been maintaining. He checkes his BP at home and as had good readings for the most part. He also takes his blood sugar regular as welll    Expected Outcomes Short: Continue to attend rehab. Long: Continue taking vitals at home and report any abnormalities to his doctor.          ITP Comments:  ITP Comments     Row Name 11/02/23 1314 12/07/23 1331 12/08/23 1526 12/21/23 1016 01/05/24 0853   ITP Comments Completed virtual orientation today.  EP evaluation is scheduled for 11/04/23 at 1330 .  Documentation for diagnosis can be found in [facility] encounter 10/12/23. Patient attend orientation today.  Patient is attending Cardiac Rehabilitation Program.  Documentation for diagnosis can be found in [facility].  Reviewed medical chart, RPE/RPD, gym safety, and program guidelines.  Patient was fitted to equipment they will be using during rehab.  Patient is scheduled to start exercise on 12/14/23.   Initial ITP created and sent for review  and signature by Dr. Dorn Ross, Medical Director for Cardiac Rehabilitation Program. 30 day review completed. ITP sent to Dr. Dorn Ross, Medical Director of Cardiac Rehab. Continue with ITP unless changes are made by physician.  Pt has only completed orientation thus far. First full day of exercise!  Patient was oriented to gym and equipment including functions, settings, policies, and procedures.  Patient's individual exercise prescription and treatment plan were reviewed.  All starting workloads were established based on the results of the 6 minute walk test done at initial orientation visit.  The plan for exercise progression was also introduced and progression will be customized based on patient's performance and goals. 30 day review completed. ITP sent to Dr. Dorn Ross, Medical Director of Cardiac Rehab. Continue with ITP unless changes are made by physician.  New to the program.    Row Name 02/01/24 1048 02/29/24 1434 03/23/24 1047 03/28/24 0958     ITP Comments 30  day review completed. ITP sent to Dr. Dorn Ross, Medical Director of Cardiac Rehab. Continue with ITP unless changes are made by physician. Unable to assess for goals, out since 01/18/24, returned today. 30 day review completed. ITP sent to Dr. Dorn Ross, Medical Director of Cardiac Rehab. Continue with ITP unless changes are made by physician. Homes exercise was reviewed with patient. He is walking at home. He also used resistance bands and streches everyday 30 day review completed. ITP sent to Dr. Dorn Ross, Medical Director of Cardiac Rehab. Continue with ITP unless changes are made by physician.       Comments: 30 day review     [1]  Current Outpatient Medications:    acyclovir  (ZOVIRAX ) 200 MG capsule, Take 1 capsule (200 mg total) by mouth 2 (two) times daily., Disp: 180 capsule, Rfl: 3   amLODipine  (NORVASC ) 5 MG tablet, Take 1 tablet (5 mg total) by mouth daily., Disp: 90 tablet, Rfl: 3   ammonium lactate (LAC-HYDRIN) 12 % lotion, APPLY TO THE TO THE AFFECTED AREA DAILY, Disp: 400 g, Rfl: 0   aspirin  EC 81 MG tablet, Take 1 tablet (81 mg total) by mouth daily. Swallow whole., Disp: 30 tablet, Rfl: 12   Cholecalciferol  1000 units tablet, Take 1,000 Units by mouth daily., Disp: , Rfl:    citalopram  (CELEXA ) 10 MG tablet, TAKE 1 TABLET(10 MG) BY MOUTH DAILY, Disp: 90 tablet, Rfl: 3   clopidogrel  (PLAVIX ) 75 MG tablet, TAKE 1 TABLET(75 MG) BY MOUTH DAILY, Disp: 90 tablet, Rfl: 3   Cyanocobalamin  (VITAMIN B-12) 1000 MCG SUBL, Place 1 tablet (1,000 mcg total) under the tongue daily., Disp: 100 tablet, Rfl: 3   ezetimibe  (ZETIA ) 10 MG tablet, TAKE 1 TABLET(10 MG) BY MOUTH DAILY, Disp: 90 tablet, Rfl: 2   finasteride  (PROSCAR ) 5 MG tablet, Take 1 tablet (5 mg total) by mouth daily., Disp: 90 tablet, Rfl: 3   glucose blood (ONETOUCH VERIO) test strip, check blood sugar In Vitro once a day DX: E11.42 for 90 days, Disp: , Rfl:    loperamide (IMODIUM) 2 MG capsule, Take 2 mg by  mouth daily as needed (Diarrhea)., Disp: , Rfl:    metFORMIN (GLUCOPHAGE-XR) 500 MG 24 hr tablet, Take 1,000 mg by mouth daily with supper. , Disp: , Rfl: 3   metoprolol  succinate (TOPROL -XL) 25 MG 24 hr tablet, Take 1 tablet (25 mg total) by mouth at bedtime., Disp: 90 tablet, Rfl: 1   nitroGLYCERIN  (NITROSTAT ) 0.4 MG SL tablet, Place 1 tablet (0.4 mg total) under the tongue  every 5 (five) minutes x 3 doses as needed for chest pain., Disp: 25 tablet, Rfl: 12   pantoprazole  (PROTONIX ) 40 MG tablet, TAKE 1 TABLET BY MOUTH EVERY DAY, Disp: 90 tablet, Rfl: 3   tamsulosin  (FLOMAX ) 0.4 MG CAPS capsule, TAKE 1 CAPSULE(0.4 MG) BY MOUTH AT BEDTIME, Disp: 90 capsule, Rfl: 3   ZYPITAMAG  4 MG TABS, TAKE 1 TABLET BY MOUTH EVERY DAY, Disp: 60 tablet, Rfl: 5 [2]  Social History Tobacco Use  Smoking Status Never  Smokeless Tobacco Former   Types: Chew   Quit date: 03/03/1975  Tobacco Comments   didn't chew much when I did; just a little bit

## 2024-03-30 ENCOUNTER — Encounter (HOSPITAL_COMMUNITY)

## 2024-04-04 ENCOUNTER — Encounter (HOSPITAL_COMMUNITY)

## 2024-04-06 ENCOUNTER — Encounter (HOSPITAL_COMMUNITY)

## 2024-04-11 ENCOUNTER — Encounter (HOSPITAL_COMMUNITY)

## 2024-04-12 ENCOUNTER — Ambulatory Visit: Admitting: Internal Medicine

## 2024-04-13 ENCOUNTER — Encounter (HOSPITAL_COMMUNITY)

## 2024-07-19 ENCOUNTER — Ambulatory Visit: Admitting: Cardiology

## 2024-08-22 ENCOUNTER — Ambulatory Visit: Admitting: Urology

## 2024-08-24 ENCOUNTER — Ambulatory Visit

## 2024-10-16 ENCOUNTER — Other Ambulatory Visit (HOSPITAL_COMMUNITY)

## 2024-10-16 ENCOUNTER — Ambulatory Visit: Admitting: Physician Assistant
